# Patient Record
Sex: Male | Born: 1975 | Race: White | Hispanic: No | Marital: Single | Smoking: Current every day smoker
Health system: Southern US, Community
[De-identification: ages and names within clinical notes are randomized; demographics above are authoritative.]

## PROBLEM LIST (undated history)

## (undated) DIAGNOSIS — C801 Malignant (primary) neoplasm, unspecified: Secondary | ICD-10-CM

## (undated) DIAGNOSIS — K2210 Ulcer of esophagus without bleeding: Secondary | ICD-10-CM

## (undated) DIAGNOSIS — N289 Disorder of kidney and ureter, unspecified: Secondary | ICD-10-CM

---

## 1999-06-17 ENCOUNTER — Emergency Department (HOSPITAL_COMMUNITY): Admission: EM | Admit: 1999-06-17 | Discharge: 1999-06-17 | Payer: Self-pay | Admitting: Emergency Medicine

## 1999-06-24 ENCOUNTER — Emergency Department (HOSPITAL_COMMUNITY): Admission: EM | Admit: 1999-06-24 | Discharge: 1999-06-24 | Payer: Self-pay | Admitting: Emergency Medicine

## 2002-09-24 ENCOUNTER — Emergency Department (HOSPITAL_COMMUNITY): Admission: EM | Admit: 2002-09-24 | Discharge: 2002-09-24 | Payer: Self-pay | Admitting: Emergency Medicine

## 2002-09-24 ENCOUNTER — Encounter: Payer: Self-pay | Admitting: *Deleted

## 2005-08-31 DIAGNOSIS — K2210 Ulcer of esophagus without bleeding: Secondary | ICD-10-CM

## 2005-08-31 HISTORY — DX: Ulcer of esophagus without bleeding: K22.10

## 2007-12-31 ENCOUNTER — Emergency Department (HOSPITAL_COMMUNITY): Admission: EM | Admit: 2007-12-31 | Discharge: 2007-12-31 | Payer: Self-pay | Admitting: Emergency Medicine

## 2008-06-16 ENCOUNTER — Inpatient Hospital Stay (HOSPITAL_COMMUNITY): Admission: EM | Admit: 2008-06-16 | Discharge: 2008-06-19 | Payer: Self-pay | Admitting: Emergency Medicine

## 2008-06-17 ENCOUNTER — Ambulatory Visit: Payer: Self-pay | Admitting: Internal Medicine

## 2011-01-13 NOTE — H&P (Signed)
NAME:  Richard Bowman, Richard Bowman NO.:  [id number]   MEDICAL RECORD NO.:  [id number]          PATIENT TYPE:  EMS   LOCATION:  MAJO                         FACILITY:  [facility]   PHYSICIAN:  Lucita Ferrara, MD         DATE OF BIRTH:  Jan 02, 1976   DATE OF ADMISSION:  06/16/2008  DATE OF DISCHARGE:                              HISTORY & PHYSICAL   PRIORITY ADMISSION HISTORY AND PHYSICAL   PRIMARY CARE PHYSICIAN:  Unassigned.   The patient is a 35 year old male with no significant past medical  history, who presents to the [facility] with the chief  complaint of nausea and vomiting.  The vomitus was described as bloody  with clots in it.  The patient denies dark stools or use of nonsteroidal  antiinflammatory medications.  He denies any abdominal pain.  He denies  ever having a gastrointestinal bleed.  He denies alcohol use.  He also  denies taking Goody Powders.  He has never had an endoscopy and never  sees a gastroenterologist.  He does have a burning esophageal pain.  He  denies recent weight loss.  He denies a family history of colon cancer.  He denies hemorrhoids.  He states that his vomitus is described as  coffee-ground.  He denies any chest pain or shortness of breath.  Otherwise, a 12-point review of systems is negative.  Supposedly, the  patient has been using Tums for the last 6 months for this progressively  worsening midepigastric burning with no relief.   PAST MEDICAL HISTORY:  None.   FAMILY HISTORY:  Noncontributory.   PAST SURGICAL HISTORY:  None.   No history of colon cancer or stomach cancer.   SOCIAL HISTORY:  The patient denies drugs.  He is a current smoker and  smokes one-half pack per day.  The patient is an occasional drinker but  denies heavy alcohol use.  He uses alcohol 1 time per week, and he  denies binging.  No recent travel, with no sick contacts.   ALLERGIES:  No known drug allergies.   MEDICATIONS:  None but the  over-the-counter Tums.   REVIEW OF SYSTEMS:  As per H and P; otherwise, negative.   PHYSICAL EXAMINATION:  GENERAL:  The patient is in no acute distress.  VITAL SIGNS:  Blood pressure is 135/60, pulse is 85, respirations are  16, pulse-ox 98% on room air.  HEENT:  Normocephalic, atraumatic.  Sclerae is anicteric, PERRLA,  extraocular muscles are intact.  NECK:  Supple, no JVD, no carotid bruits.  CARDIOVASCULAR:  S1 S2, regular rate and rhythm, no murmurs, rubs, or  clicks.  LUNGS:  Clear to auscultation bilaterally, no rhonchi, rales, or  wheezes.  ABDOMEN:  Soft, nontender, and nondistended, positive bowel sounds.  RECTAL:  Stool color normal, guaiac negative.  NEURO:  The patient is alert and oriented x3.  Cranial nerves II-XII are  grossly intact.   LABORATORY DATA:  Patient had cardiac markers that were essentially  negative, a complete metabolic panel within normal limits, AST, ALT, and  alk-phos within normal limits.  INR  is 0.9.  Two sets of cardiac markers  negative.  Stool for occult blood negative.  CBC shows a white count of  13.7, hemoglobin is 18.1, hematocrit of 53.3, platelet count of 196.   RADIOLOGICAL RESULTS:  A KUB shows no active lung disease, a nonspecific  bowel-gas pattern.   ASSESSMENT AND PLAN:  A 35 year old with:  1. Nausea, vomiting, and upper gastrointestinal bleed really described      as coffee-ground emesis.  2. History of 6 months of midepigastric burning that is not relieved      with Tums.   PLAN:  We will go ahead and admit the patient to the medical telemetry  unit.  We will monitor his hemoglobin and hematocrit.  Will initiate  Protonix 40 mg IV twice daily.  We will proceed with a Gastrointestinal  consult for a possible upper endoscopy.  Transfuse as needed.  The  patient is hemodynamically stable.  We will keep the patient NPO, IV  hydration, and IV antiemetics.  The rest of the plans are dependent on  his progress and consultant  recommendations.  SCD boots for DVT  prophylaxis.      Lucita Ferrara, MD  Electronically Signed     RR/MEDQ  D:  06/16/2008  T:  06/16/2008  Job:  [id number]

## 2011-01-13 NOTE — Discharge Summary (Signed)
NAMECONWAY, Richard Bowman              ACCOUNT NO.:  [id number]   MEDICAL RECORD NO.:  [id number]          PATIENT TYPE:  INP   LOCATION:  5528                         FACILITY:  [facility]   PHYSICIAN:  Peggye Pitt, M.D. DATE OF BIRTH:  10/10/1975   DATE OF ADMISSION:  06/16/2008  DATE OF DISCHARGE:  06/19/2008                               DISCHARGE SUMMARY   DISCHARGE DIAGNOSES:  1. Acute upper gastrointestinal bleed secondary to esophageal ulcer      and gastritis.  2. Febrile illness, likely secondary to influenza.   DISCHARGE MEDICATIONS:  1. Omeprazole 20 mg p.o. b.i.d.  2. Sucralfate 1 gram 30 minutes q.a.c. plus nightly.  3. Tamiflu 75 mg p.o. b.i.d. for 2 more days.  4. Avelox 400 mg p.o. daily for 8 more days.   DISPOSITION AND FOLLOWUP:  The patient is discharged home in stable  condition.  He is advised not to use any aspirin, ibuprofen, Aleve, or  any other NSAIDs for pain.  He has also been instructed to avoid foods  that are high in acid.  Dr. Loreta Ave would like to see him in his office  again in 2-3 weeks to repeat an EGD, and I have asked the case manager  to provide [name] with her office number so that he can call her  and arrange for this appointment.   CONSULTATIONS THIS HOSPITALIZATION:  Dr. Loreta Ave with Gastroenterology.   Images on this hospitalization include:  1. An acute abdominal series on June 16, 2008, that showed no acute      lung disease with nonspecific bowel gas pattern, no obstruction or      free air.  2. The patient also had a chest x-ray on June 17, 2008, that showed      low lung volumes with mild pulmonary vascular congestion without      frank edema or focal air space disease.  3. The patient also had an EGD performed by Dr. Loreta Ave that showed a      small distal esophageal ulcer with distal esophagitis and patchy      gastritis.   HISTORY AND PHYSICAL EXAMINATION:  For full details, please refer to  history and physical exam  dictated by Dr. Flonnie Overman on date June 16, 2008, but in brief, Mr. Richard Bowman is a pleasant 35 year old Caucasian man  without significant past medical history who presented to the emergency  department with complaints of nausea and vomiting.  The vomiting was  described as bloody with clots in it.  He denied any dark stools or use  of NSAIDs.  For this reason, we were called for admission.   HOSPITAL COURSE BY PROBLEM:  1. Acute GI bleed:  Initially placed n.p.o. with cycling CBCs.  His      hemoglobin always remained stable above 14.  He did not receive any      transfusions on this hospitalization.  Dr. Loreta Ave with GI was      consulted who performed an EGD with the above findings and has      recommended that he be on b.i.d. PPI, as well  as Carafate.  She      will follow up with him in 2-3 weeks for repeat EGD to see if ulcer      is healing.  2. Febrile illness while in the hospital:  He had very high fevers up      to 103.  He was started empirically on Avelox for pneumonia and on      Tamiflu for influenza.  At the time of discharge, we have no      positive culture data, but I believe that this is likely secondary      to influenza.  He will continue the Tamiflu and the Avelox upon      discharge.  He has defervesced by the time of this discharge.   VITAL SIGNS ON DAY OF DISCHARGE:  Blood pressure 99/59, heart rate 96,  respirations 18, and O2 sats 96% on room air with a temperature 98.2.   LABORATORY ON DAY OF DISCHARGE:  Sodium 140, potassium 4.0, chloride  104, bicarb 28, BUN 9, creatinine 1.40, and glucose of 91.  WBCs 5.1,  hemoglobin 14.1, platelets of 144, and a mag level of 2.3.      Peggye Pitt, M.D.  Electronically Signed     EH/MEDQ  D:  06/19/2008  T:  06/20/2008  Job:  [id number]   cc:   Anselmo Rod, M.D.

## 2011-01-13 NOTE — Op Note (Signed)
Richard Bowman, Richard Bowman              ACCOUNT NO.:  [id number]   MEDICAL RECORD NO.:  [id number]          PATIENT TYPE:  INP   LOCATION:  5528                         FACILITY:  [facility]   PHYSICIAN:  Anselmo Rod, M.D.  DATE OF BIRTH:  02/17/1976   DATE OF PROCEDURE:  06/18/2008  DATE OF DISCHARGE:  06/19/2008                               OPERATIVE REPORT   PROCEDURE:  Esophagogastroduodenoscopy.   ENDOSCOPIST:  Anselmo Rod.   INSTRUMENT USED:  Pentax video panendoscope.   INDICATIONS FOR PROCEDURE:  This is a 35 year old white male with a  history of nausea, vomiting and bloody emesis, rule out peptic ulcer  disease, esophagitis, gastritis, etc.   PREPROCEDURE PREPARATION:  Informed consent was procured from the  patient.  The patient fasted for 8 hours prior to the procedure.  Risks  and benefits of the procedure were discussed with the patient in detail.   PHYSICAL EXAMINATION:  VITAL SIGNS:  The patient had stable vital signs.  NECK:  Supple.  CHEST:  Clear to auscultation.  HEART:  ABDOMEN:  Soft with normal bowel sounds.   DESCRIPTION OF PROCEDURE:  The patient was placed in the left lateral  decubitus position and sedated with 75 mcg of Fentanyl and 7.5 mg of  Versed given intravenously in slow incremental doses.  The patient was  also given 15 mL of viscous lidocaine to swish and swallow prior to the  procedure. Once the patient was adequately sedated and maintained on low-  flow oxygen and continuous cardiac monitoring, the Pentax video  colonoscope was advanced from the  mouth through the mouthpiece into the  esophagus under direct vision.  Small distal esophageal ulcer was noted  with distal esophagitis.  Patchy gastritis was appreciated.  No ulcers,  erosions, masses, or polyps identified in the stomach.  The proximal  small bowel appeared normal.  Retroflexion of the high cardia revealed  no abnormalities.  The patient tolerated the procedure well without  complications.   IMPRESSION:  1. Small distal esophageal ulcer close to the Z-line with distal      esophagitis (grade 2 to 3).  2. Patchy gastritis.  3. Normal proximal small bowel.   RECOMMENDATIONS:  1. Continue PPI.  2. Carafate slurry.  3. Avoid all nonsteroidals for now.  4. Further recommendations made in follow-up.      Anselmo Rod, M.D.  Electronically Signed     JNM/MEDQ  D:  07/09/2008  T:  07/10/2008  Job:  [id number]

## 2011-06-01 LAB — CBC
HCT: 53.3 — ABNORMAL HIGH
MCHC: 33.8
Platelets: 144 — ABNORMAL LOW
Platelets: 196
RDW: 13.1
RDW: 13.1

## 2011-06-01 LAB — HEMOGLOBIN AND HEMATOCRIT, BLOOD
HCT: 44.9
Hemoglobin: 14.2
Hemoglobin: 15.3

## 2011-06-01 LAB — COMPREHENSIVE METABOLIC PANEL
AST: 22
AST: 32
Albumin: 3.4 — ABNORMAL LOW
Albumin: 4.5
Alkaline Phosphatase: 84
BUN: 15
CO2: 24
Calcium: 8.4
Creatinine, Ser: 1.57 — ABNORMAL HIGH
GFR calc Af Amer: >60
GFR calc Af Amer: >60
GFR calc non Af Amer: 52 — ABNORMAL LOW
Potassium: 3.8
Total Protein: 8

## 2011-06-01 LAB — EXPECTORATED SPUTUM ASSESSMENT W GRAM STAIN, RFLX TO RESP C

## 2011-06-01 LAB — DIFFERENTIAL
Lymphocytes Relative: 7 — ABNORMAL LOW
Monocytes Absolute: 0.7
Monocytes Relative: 5
Neutro Abs: 11.9 — ABNORMAL HIGH

## 2011-06-01 LAB — URINE CULTURE
Colony Count: NO GROWTH
Special Requests: NEGATIVE

## 2011-06-01 LAB — BASIC METABOLIC PANEL
BUN: 8
BUN: 9
CO2: 25
Calcium: 8.8
Calcium: 9
Creatinine, Ser: 1.39
Creatinine, Ser: 1.4
GFR calc Af Amer: >60
GFR calc non Af Amer: 60 — ABNORMAL LOW
Glucose, Bld: 91
Glucose, Bld: 99
Sodium: 140

## 2011-06-01 LAB — PHOSPHORUS: Phosphorus: 2.1 — ABNORMAL LOW

## 2011-06-01 LAB — CULTURE, BLOOD (ROUTINE X 2): Culture: NO GROWTH

## 2011-06-01 LAB — ABO/RH: ABO/RH(D): A POS

## 2011-06-01 LAB — TYPE AND SCREEN: ABO/RH(D): A POS

## 2011-06-01 LAB — OCCULT BLOOD X 1 CARD TO LAB, STOOL: Fecal Occult Bld: NEGATIVE

## 2011-06-01 LAB — MAGNESIUM: Magnesium: 1.5

## 2014-05-22 ENCOUNTER — Encounter (HOSPITAL_COMMUNITY): Payer: Self-pay | Admitting: Emergency Medicine

## 2014-05-22 DIAGNOSIS — R404 Transient alteration of awareness: Secondary | ICD-10-CM | POA: Insufficient documentation

## 2014-05-22 LAB — URINALYSIS, ROUTINE W REFLEX MICROSCOPIC
Bilirubin Urine: NEGATIVE
GLUCOSE, UA: NEGATIVE mg/dL
Hgb urine dipstick: NEGATIVE
KETONES UR: NEGATIVE mg/dL
LEUKOCYTES UA: NEGATIVE
Nitrite: NEGATIVE
PH: 5 (ref 5.0–8.0)
PROTEIN: NEGATIVE mg/dL
Specific Gravity, Urine: 1.02 (ref 1.005–1.030)
Urobilinogen, UA: 0.2 mg/dL (ref 0.0–1.0)

## 2014-05-22 LAB — BASIC METABOLIC PANEL
Anion gap: 11 (ref 5–15)
BUN: 11 mg/dL (ref 6–23)
CALCIUM: 9.7 mg/dL (ref 8.4–10.5)
CO2: 29 meq/L (ref 19–32)
Chloride: 104 mEq/L (ref 96–112)
Creatinine, Ser: 1.35 mg/dL (ref 0.50–1.35)
GFR calc Af Amer: 77 mL/min — ABNORMAL LOW (ref >90)
GFR, EST NON AFRICAN AMERICAN: 66 mL/min — AB (ref >90)
Glucose, Bld: 94 mg/dL (ref 70–99)
Potassium: 4 mEq/L (ref 3.7–5.3)
SODIUM: 144 meq/L (ref 137–147)

## 2014-05-22 LAB — CBC WITH DIFFERENTIAL/PLATELET
Basophils Absolute: 0 10*3/uL (ref 0.0–0.1)
Basophils Relative: 0 % (ref 0–1)
EOS PCT: 3 % (ref 0–5)
Eosinophils Absolute: 0.3 10*3/uL (ref 0.0–0.7)
HCT: 45.5 % (ref 39.0–52.0)
HEMOGLOBIN: 15.9 g/dL (ref 13.0–17.0)
LYMPHS PCT: 34 % (ref 12–46)
Lymphs Abs: 3.1 10*3/uL (ref 0.7–4.0)
MCH: 30.5 pg (ref 26.0–34.0)
MCHC: 34.9 g/dL (ref 30.0–36.0)
MCV: 87.2 fL (ref 78.0–100.0)
MONOS PCT: 7 % (ref 3–12)
Monocytes Absolute: 0.6 10*3/uL (ref 0.1–1.0)
NEUTROS ABS: 5.1 10*3/uL (ref 1.7–7.7)
Neutrophils Relative %: 56 % (ref 43–77)
Platelets: 223 10*3/uL (ref 150–400)
RBC: 5.22 MIL/uL (ref 4.22–5.81)
RDW: 12.5 % (ref 11.5–15.5)
WBC: 9 10*3/uL (ref 4.0–10.5)

## 2014-05-22 LAB — CBG MONITORING, ED: GLUCOSE-CAPILLARY: 121 mg/dL — AB (ref 70–99)

## 2014-05-22 NOTE — ED Notes (Addendum)
Pt lost consciousness x 2 on Sunday; EMS arrived and CBG was 33. Unresponsive then came around enough for him to eat and CBG came back up. Pt refused transport. Friend says that she can tell when he is getting low because his speech slurs. Pt reports always being thirsty but not frequent urination. Pt states he feels tired all the time.

## 2014-05-23 ENCOUNTER — Emergency Department (HOSPITAL_COMMUNITY)
Admission: EM | Admit: 2014-05-23 | Discharge: 2014-05-23 | Discharge status: Against Medical Advice | Payer: Self-pay | Attending: Emergency Medicine | Admitting: Emergency Medicine

## 2014-05-23 HISTORY — DX: Ulcer of esophagus without bleeding: K22.10

## 2014-05-23 NOTE — ED Notes (Signed)
No response when pt called for reassessment.

## 2014-05-30 ENCOUNTER — Emergency Department (HOSPITAL_COMMUNITY): Payer: Self-pay

## 2014-05-30 ENCOUNTER — Encounter (HOSPITAL_COMMUNITY): Payer: Self-pay | Admitting: Emergency Medicine

## 2014-05-30 ENCOUNTER — Emergency Department (HOSPITAL_COMMUNITY)
Admission: EM | Admit: 2014-05-30 | Discharge: 2014-05-30 | Disposition: A | Discharge status: Home / Self Care | Payer: Self-pay | Attending: Emergency Medicine | Admitting: Emergency Medicine

## 2014-05-30 DIAGNOSIS — F29 Unspecified psychosis not due to a substance or known physiological condition: Secondary | ICD-10-CM | POA: Insufficient documentation

## 2014-05-30 DIAGNOSIS — F17200 Nicotine dependence, unspecified, uncomplicated: Secondary | ICD-10-CM | POA: Insufficient documentation

## 2014-05-30 DIAGNOSIS — R569 Unspecified convulsions: Secondary | ICD-10-CM | POA: Insufficient documentation

## 2014-05-30 DIAGNOSIS — N189 Chronic kidney disease, unspecified: Secondary | ICD-10-CM | POA: Insufficient documentation

## 2014-05-30 DIAGNOSIS — Z8719 Personal history of other diseases of the digestive system: Secondary | ICD-10-CM | POA: Insufficient documentation

## 2014-05-30 HISTORY — DX: Disorder of kidney and ureter, unspecified: N28.9

## 2014-05-30 LAB — COMPREHENSIVE METABOLIC PANEL
ALT: 22 U/L (ref 0–53)
ANION GAP: 13 (ref 5–15)
AST: 18 U/L (ref 0–37)
Albumin: 3.8 g/dL (ref 3.5–5.2)
Alkaline Phosphatase: 85 U/L (ref 39–117)
BUN: 10 mg/dL (ref 6–23)
CO2: 25 meq/L (ref 19–32)
Calcium: 9.5 mg/dL (ref 8.4–10.5)
Chloride: 104 mEq/L (ref 96–112)
Creatinine, Ser: 1.18 mg/dL (ref 0.50–1.35)
GFR calc non Af Amer: 78 mL/min — ABNORMAL LOW (ref >90)
GLUCOSE: 108 mg/dL — AB (ref 70–99)
Potassium: 3.9 mEq/L (ref 3.7–5.3)
Sodium: 142 mEq/L (ref 137–147)
Total Bilirubin: 0.2 mg/dL — ABNORMAL LOW (ref 0.3–1.2)
Total Protein: 7 g/dL (ref 6.0–8.3)

## 2014-05-30 LAB — CBC
HEMATOCRIT: 44.4 % (ref 39.0–52.0)
Hemoglobin: 15.6 g/dL (ref 13.0–17.0)
MCH: 30.8 pg (ref 26.0–34.0)
MCHC: 35.1 g/dL (ref 30.0–36.0)
MCV: 87.6 fL (ref 78.0–100.0)
Platelets: 182 10*3/uL (ref 150–400)
RBC: 5.07 MIL/uL (ref 4.22–5.81)
RDW: 12.3 % (ref 11.5–15.5)
WBC: 8.6 10*3/uL (ref 4.0–10.5)

## 2014-05-30 LAB — CBG MONITORING, ED: Glucose-Capillary: 166 mg/dL — ABNORMAL HIGH (ref 70–99)

## 2014-05-30 NOTE — Discharge Instructions (Signed)
Epilepsy °Epilepsy is a disorder in which a person has repeated seizures over time. A seizure is a release of abnormal electrical activity in the brain. Seizures can cause a change in attention, behavior, or the ability to remain awake and alert (altered mental status). Seizures often involve uncontrollable shaking (convulsions).  °Most people with epilepsy lead normal lives. However, people with epilepsy are at an increased risk of falls, accidents, and injuries. Therefore, it is important to begin treatment right away. °CAUSES  °Epilepsy has many possible causes. Anything that disturbs the normal pattern of brain cell activity can lead to seizures. This may include:  °· Head injury. °· Birth trauma. °· High fever as a child. °· Stroke. °· Bleeding into or around the brain. °· Certain drugs. °· Prolonged low oxygen, such as what occurs after CPR efforts. °· Abnormal brain development. °· Certain illnesses, such as meningitis, encephalitis (brain infection), malaria, and other infections. °· An imbalance of nerve signaling chemicals (neurotransmitters).   °SIGNS AND SYMPTOMS  °The symptoms of a seizure can vary greatly from one person to another. Right before a seizure, you may have a warning (aura) that a seizure is about to occur. An aura may include the following symptoms: °· Fear or anxiety. °· Nausea. °· Feeling like the room is spinning (vertigo). °· Vision changes, such as seeing flashing lights or spots. °Common symptoms during a seizure include: °· Abnormal sensations, such as an abnormal smell or a bitter taste in the mouth.   °· Sudden, general body stiffness.   °· Convulsions that involve rhythmic jerking of the face, arm, or leg on one or both sides.   °· Sudden change in consciousness.   °· Appearing to be awake but not responding.   °· Appearing to be asleep but cannot be awakened.   °· Grimacing, chewing, lip smacking, drooling, tongue biting, or loss of bowel or bladder control. °After a seizure,  you may feel sleepy for a while.  °DIAGNOSIS  °Your health care provider will ask about your symptoms and take a medical history. Descriptions from any witnesses to your seizures will be very helpful in the diagnosis. A physical exam, including a detailed neurological exam, is necessary. Various tests may be done, such as:  °· An electroencephalogram (EEG). This is a painless test of your brain waves. In this test, a diagram is created of your brain waves. These diagrams can be interpreted by a specialist. °· An MRI of the brain.   °· A CT scan of the brain.   °· A spinal tap (lumbar puncture, LP). °· Blood tests to check for signs of infection or abnormal blood chemistry. °TREATMENT  °There is no cure for epilepsy, but it is generally treatable. Once epilepsy is diagnosed, it is important to begin treatment as soon as possible. For most people with epilepsy, seizures can be controlled with medicines. The following may also be used: °· A pacemaker for the brain (vagus nerve stimulator) can be used for people with seizures that are not well controlled by medicine. °· Surgery on the brain. °For some people, epilepsy eventually goes away. °HOME CARE INSTRUCTIONS  °· Follow your health care provider's recommendations on driving and safety in normal activities. °· Get enough rest. Lack of sleep can cause seizures. °· Only take over-the-counter or prescription medicines as directed by your health care provider. Take any prescribed medicine exactly as directed. °· Avoid any known triggers of your seizures. °· Keep a seizure diary. Record what you recall about any seizure, especially any possible trigger.   °· Make   sure the people you live and work with know that you are prone to seizures. They should receive instructions on how to help you. In general, a witness to a seizure should:   °¨ Cushion your head and body.   °¨ Turn you on your side.   °¨ Avoid unnecessarily restraining you.   °¨ Not place anything inside your  mouth.   °¨ Call for emergency medical help if there is any question about what has occurred.   °· Follow up with your health care provider as directed. You may need regular blood tests to monitor the levels of your medicine.   °SEEK MEDICAL CARE IF:  °· You develop signs of infection or other illness. This might increase the risk of a seizure.   °· You seem to be having more frequent seizures.   °· Your seizure pattern is changing.   °SEEK IMMEDIATE MEDICAL CARE IF:  °· You have a seizure that does not stop after a few moments.   °· You have a seizure that causes any difficulty in breathing.   °· You have a seizure that results in a very severe headache.   °· You have a seizure that leaves you with the inability to speak or use a part of your body.   °Document Released: 08/17/2005 Document Revised: 06/07/2013 Document Reviewed: 03/29/2013 °ExitCare® Patient Information ©2015 [employer]. This information is not intended to replace advice given to you by your health care provider. Make sure you discuss any questions you have with your health care provider. ° °Driving and Equipment Restrictions °Some medical problems make it dangerous to drive, ride a bike, or use machines. Some of these problems are: °· A hard blow to the head (concussion). °· Passing out (fainting). °· Twitching and shaking (seizures). °· Low blood sugar. °· Taking medicine to help you relax (sedatives). °· Taking pain medicines. °· Wearing an eye patch. °· Wearing splints. This can make it hard to use parts of your body that you need to drive safely. °HOME CARE  °· Do not drive until your doctor says it is okay. °· Do not use machines until your doctor says it is okay. °You may need a form signed by your doctor (medical release) before you can drive again. You may also need this form before you do other tasks where you need to be fully alert. °MAKE SURE YOU: °· Understand these instructions. °· Will watch your condition. °· Will get help right  away if you are not doing well or get worse. °Document Released: 09/24/2004 Document Revised: 11/09/2011 Document Reviewed: 12/25/2009 °ExitCare® Patient Information ©2015 [employer]. This information is not intended to replace advice given to you by your health care provider. Make sure you discuss any questions you have with your health care provider. ° °

## 2014-05-30 NOTE — ED Notes (Signed)
Pt's friend states that they were leaving [facility].  States that he stated that he did not feel well and then stuck his head out the window to vomit.  Then began shaking and gasping for air and "went unconscious".  When he woke up, pt was combative.  In triage chair, pt went unresponsive.  Responded to ammonia inhalant.  Pt not a known diabetic but is suspected.  Appt Wednesday to find out.  Has CKD.  Sunday had hypoglycemic event w/ CBG of 33.

## 2014-05-30 NOTE — ED Notes (Signed)
Pt reports they are leaving with ALL belongings they arrived with. Pt is ambulatory upon discharge.

## 2014-05-30 NOTE — ED Notes (Signed)
MD Allen at bedside.

## 2014-05-30 NOTE — ED Provider Notes (Signed)
CSN: [id number]     Arrival date & time 05/30/14  1450 History   First MD Initiated Contact with Patient 05/30/14 1520     Chief Complaint  Patient presents with  . Seizures     (Consider location/radiation/quality/duration/timing/severity/associated sxs/prior Treatment) HPI Comments: Patient here complaining of seizure-like activity just prior to arrival. His wife describes a 30 second episode of generalized tonic-clonic activity with confusion that was transient after the event. No loss of bowel or bladder function. No tongue biting noted. No recent history of headaches head trauma. Denies any history of seizure disorder. Recently diagnosed with chronic kidney disease as well as possible diabetes. Did have a hypoglycemic event 4 days ago with cbg of 33. CBg today is above 100. Patient states he still feels sleepy. A recent fever or neck pain.  Patient is a 38 y.o. male presenting with seizures. The history is provided by the patient and the spouse.  Seizures   Past Medical History  Diagnosis Date  . Esophageal ulcer   . Renal disorder     CKD   History reviewed. No pertinent past surgical history. History reviewed. No pertinent family history. History  Substance Use Topics  . Smoking status: Current Every Day Smoker  . Smokeless tobacco: Not on file  . Alcohol Use: No    Review of Systems  Neurological: Positive for seizures.  All other systems reviewed and are negative.     Allergies  Review of patient's allergies indicates no known allergies.  Home Medications   Prior to Admission medications   Medication Sig Start Date End Date Taking? Authorizing Provider  ibuprofen (ADVIL,MOTRIN) 200 MG tablet Take 800 mg by mouth every 6 (six) hours as needed for moderate pain.   Yes Historical Provider, MD   BP 143/100  Pulse 97  Temp(Src) 98.6 F (37 C) (Oral)  Resp 17  SpO2 97% Physical Exam  Nursing note and vitals reviewed. Constitutional: He is oriented to person,  place, and time. He appears well-developed and well-nourished.  Non-toxic appearance. No distress.  HENT:  Head: Normocephalic and atraumatic.  Eyes: Conjunctivae, EOM and lids are normal. Pupils are equal, round, and reactive to light.  Neck: Normal range of motion. Neck supple. No tracheal deviation present. No mass present.  Cardiovascular: Normal rate, regular rhythm and normal heart sounds.  Exam reveals no gallop.   No murmur heard. Pulmonary/Chest: Effort normal and breath sounds normal. No stridor. No respiratory distress. He has no decreased breath sounds. He has no wheezes. He has no rhonchi. He has no rales.  Abdominal: Soft. Normal appearance and bowel sounds are normal. He exhibits no distension. There is no tenderness. There is no rebound and no CVA tenderness.  Musculoskeletal: Normal range of motion. He exhibits no edema and no tenderness.  Neurological: He is alert and oriented to person, place, and time. He has normal strength. No cranial nerve deficit or sensory deficit. GCS eye subscore is 4. GCS verbal subscore is 5. GCS motor subscore is 6.  Skin: Skin is warm and dry. No abrasion and no rash noted.  Psychiatric: He has a normal mood and affect. His speech is normal and behavior is normal.    ED Course  Procedures (including critical care time) Labs Review Labs Reviewed  CBG MONITORING, ED - Abnormal; Notable for the following:    Glucose-Capillary 166 (*)    All other components within normal limits  CBC  COMPREHENSIVE METABOLIC PANEL    Imaging Review No results found.  EKG Interpretation None      MDM   Final diagnoses:  None    Patient without seizure activity here. Renal function has improved. Will be given referral to neurology and instructed to not operate heavy machinery or drive a car    Toy BakerAnthony T Kathie Posa, MD 05/30/14 1728

## 2014-05-31 ENCOUNTER — Telehealth: Payer: Self-pay | Admitting: *Deleted

## 2014-05-31 NOTE — Telephone Encounter (Signed)
Patient calling to schedule an appointment with Dr. Anne Hahn for a follow up, offered first available for 06/15/14, patient stated that he will call to be sent somewhere else, and hung up the phone.

## 2014-06-04 ENCOUNTER — Ambulatory Visit: Payer: Self-pay | Admitting: Family

## 2014-06-04 DIAGNOSIS — Z0289 Encounter for other administrative examinations: Secondary | ICD-10-CM

## 2014-06-12 ENCOUNTER — Encounter: Payer: Self-pay | Admitting: Internal Medicine

## 2014-06-12 ENCOUNTER — Ambulatory Visit: Payer: Self-pay | Attending: Internal Medicine | Admitting: Internal Medicine

## 2014-06-12 VITALS — BP 104/65 | HR 74 | Temp 98.8°F | Resp 16 | Ht 70.0 in | Wt 207.2 lb

## 2014-06-12 DIAGNOSIS — R739 Hyperglycemia, unspecified: Secondary | ICD-10-CM | POA: Insufficient documentation

## 2014-06-12 DIAGNOSIS — K2210 Ulcer of esophagus without bleeding: Secondary | ICD-10-CM | POA: Insufficient documentation

## 2014-06-12 DIAGNOSIS — F17200 Nicotine dependence, unspecified, uncomplicated: Secondary | ICD-10-CM | POA: Insufficient documentation

## 2014-06-12 DIAGNOSIS — N189 Chronic kidney disease, unspecified: Secondary | ICD-10-CM | POA: Insufficient documentation

## 2014-06-12 DIAGNOSIS — Z2821 Immunization not carried out because of patient refusal: Secondary | ICD-10-CM | POA: Insufficient documentation

## 2014-06-12 DIAGNOSIS — Z720 Tobacco use: Secondary | ICD-10-CM | POA: Insufficient documentation

## 2014-06-12 DIAGNOSIS — R569 Unspecified convulsions: Secondary | ICD-10-CM | POA: Insufficient documentation

## 2014-06-12 DIAGNOSIS — Z716 Tobacco abuse counseling: Secondary | ICD-10-CM | POA: Insufficient documentation

## 2014-06-12 LAB — GLUCOSE, POCT (MANUAL RESULT ENTRY): POC Glucose: 132 mg/dl — AB (ref 70–99)

## 2014-06-12 LAB — POCT GLYCOSYLATED HEMOGLOBIN (HGB A1C): HEMOGLOBIN A1C: 5.6

## 2014-06-12 NOTE — Progress Notes (Signed)
Patient presents to establish care States 3-4 weeks ago had 2 episodes of vomiting and passing out EMS came and CBG was 33 States had seizure 2 weeks ago States was told in ED that he has kidney disease Injured back in 2011. Now with low back spasms; rates 3/10 at present

## 2014-06-12 NOTE — Patient Instructions (Signed)
Smoking Cessation Quitting smoking is important to your health and has many advantages. However, it is not always easy to quit since nicotine is a very addictive drug. Oftentimes, people try 3 times or more before being able to quit. This document explains the best ways for you to prepare to quit smoking. Quitting takes hard work and a lot of effort, but you can do it. ADVANTAGES OF QUITTING SMOKING  You will live longer, feel better, and live better.  Your body will feel the impact of quitting smoking almost immediately.  Within 20 minutes, blood pressure decreases. Your pulse returns to its normal level.  After 8 hours, carbon monoxide levels in the blood return to normal. Your oxygen level increases.  After 24 hours, the chance of having a heart attack starts to decrease. Your breath, hair, and body stop smelling like smoke.  After 48 hours, damaged nerve endings begin to recover. Your sense of taste and smell improve.  After 72 hours, the body is virtually free of nicotine. Your bronchial tubes relax and breathing becomes easier.  After 2 to 12 weeks, lungs can hold more air. Exercise becomes easier and circulation improves.  The risk of having a heart attack, stroke, cancer, or lung disease is greatly reduced.  After 1 year, the risk of coronary heart disease is cut in half.  After 5 years, the risk of stroke falls to the same as a nonsmoker.  After 10 years, the risk of lung cancer is cut in half and the risk of other cancers decreases significantly.  After 15 years, the risk of coronary heart disease drops, usually to the level of a nonsmoker.  If you are pregnant, quitting smoking will improve your chances of having a healthy baby.  The people you live with, especially any children, will be healthier.  You will have extra money to spend on things other than cigarettes. QUESTIONS TO THINK ABOUT BEFORE ATTEMPTING TO QUIT You may want to talk about your answers with your  health care provider.  Why do you want to quit?  If you tried to quit in the past, what helped and what did not?  What will be the most difficult situations for you after you quit? How will you plan to handle them?  Who can help you through the tough times? Your family? Friends? A health care provider?  What pleasures do you get from smoking? What ways can you still get pleasure if you quit? Here are some questions to ask your health care provider:  How can you help me to be successful at quitting?  What medicine do you think would be best for me and how should I take it?  What should I do if I need more help?  What is smoking withdrawal like? How can I get information on withdrawal? GET READY  Set a quit date.  Change your environment by getting rid of all cigarettes, ashtrays, matches, and lighters in your home, car, or work. Do not let people smoke in your home.  Review your past attempts to quit. Think about what worked and what did not. GET SUPPORT AND ENCOURAGEMENT You have a better chance of being successful if you have help. You can get support in many ways.  Tell your family, friends, and coworkers that you are going to quit and need their support. Ask them not to smoke around you.  Get individual, group, or telephone counseling and support. Programs are available at local hospitals and health centers. Call   your local health department for information about programs in your area.  Spiritual beliefs and practices may help some smokers quit.  Download a "quit meter" on your computer to keep track of quit statistics, such as how long you have gone without smoking, cigarettes not smoked, and money saved.  Get a self-help book about quitting smoking and staying off tobacco. LEARN NEW SKILLS AND BEHAVIORS  Distract yourself from urges to smoke. Talk to someone, go for a walk, or occupy your time with a task.  Change your normal routine. Take a different route to work.  Drink tea instead of coffee. Eat breakfast in a different place.  Reduce your stress. Take a hot bath, exercise, or read a book.  Plan something enjoyable to do every day. Reward yourself for not smoking.  Explore interactive web-based programs that specialize in helping you quit. GET MEDICINE AND USE IT CORRECTLY Medicines can help you stop smoking and decrease the urge to smoke. Combining medicine with the above behavioral methods and support can greatly increase your chances of successfully quitting smoking.  Nicotine replacement therapy helps deliver nicotine to your body without the negative effects and risks of smoking. Nicotine replacement therapy includes nicotine gum, lozenges, inhalers, nasal sprays, and skin patches. Some may be available over-the-counter and others require a prescription.  Antidepressant medicine helps people abstain from smoking, but how this works is unknown. This medicine is available by prescription.  Nicotinic receptor partial agonist medicine simulates the effect of nicotine in your brain. This medicine is available by prescription. Ask your health care provider for advice about which medicines to use and how to use them based on your health history. Your health care provider will tell you what side effects to look out for if you choose to be on a medicine or therapy. Carefully read the information on the package. Do not use any other product containing nicotine while using a nicotine replacement product.  RELAPSE OR DIFFICULT SITUATIONS Most relapses occur within the first 3 months after quitting. Do not be discouraged if you start smoking again. Remember, most people try several times before finally quitting. You may have symptoms of withdrawal because your body is used to nicotine. You may crave cigarettes, be irritable, feel very hungry, cough often, get headaches, or have difficulty concentrating. The withdrawal symptoms are only temporary. They are strongest  when you first quit, but they will go away within 10-14 days. To reduce the chances of relapse, try to:  Avoid drinking alcohol. Drinking lowers your chances of successfully quitting.  Reduce the amount of caffeine you consume. Once you quit smoking, the amount of caffeine in your body increases and can give you symptoms, such as a rapid heartbeat, sweating, and anxiety.  Avoid smokers because they can make you want to smoke.  Do not let weight gain distract you. Many smokers will gain weight when they quit, usually less than 10 pounds. Eat a healthy diet and stay active. You can always lose the weight gained after you quit.  Find ways to improve your mood other than smoking. FOR MORE INFORMATION  www.smokefree.gov  Document Released: 08/11/2001 Document Revised: 01/01/2014 Document Reviewed: 11/26/2011 Exit[employer] Patient Information 2015 [employer]. This information is not intended to replace advice given to you by your health care provider. Make sure you discuss any questions you have with your health care provider.  
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
## 2014-06-13 ENCOUNTER — Ambulatory Visit: Payer: Self-pay | Attending: Internal Medicine

## 2014-06-27 ENCOUNTER — Other Ambulatory Visit: Payer: Self-pay

## 2014-06-28 ENCOUNTER — Ambulatory Visit: Payer: Self-pay | Attending: Internal Medicine | Admitting: *Deleted

## 2014-06-28 VITALS — BP 112/67 | HR 80 | Temp 98.9°F

## 2014-06-28 DIAGNOSIS — R739 Hyperglycemia, unspecified: Secondary | ICD-10-CM

## 2014-06-28 DIAGNOSIS — E162 Hypoglycemia, unspecified: Secondary | ICD-10-CM | POA: Insufficient documentation

## 2014-06-28 DIAGNOSIS — R569 Unspecified convulsions: Secondary | ICD-10-CM

## 2014-06-28 DIAGNOSIS — Z299 Encounter for prophylactic measures, unspecified: Secondary | ICD-10-CM

## 2014-06-28 LAB — POCT CBG (FASTING - GLUCOSE)-MANUAL ENTRY: Glucose Fasting, POC: 98 mg/dL (ref 70–99)

## 2014-06-28 NOTE — Progress Notes (Unsigned)
Patient presents for fasting blood draw and CBG Still smoking 1 ppd Stopped eating fast food Has cut back on energy drinks: Now 1-2 per week down from 1-2 per day States feeling better Declined flu vaccine  Fasting CBG 98  BP 112/67 P 80 SPO2 97% 98.9 oral  States recently learned partner was unfaithful; requesting STD testing.

## 2014-06-28 NOTE — Patient Instructions (Signed)
Hypoglycemia °Hypoglycemia occurs when the glucose in your blood is too low. Glucose is a type of sugar that is your body's main energy source. Hormones, such as insulin and glucagon, control the level of glucose in the blood. Insulin lowers blood glucose and glucagon increases blood glucose. Having too much insulin in your blood stream, or not eating enough food containing sugar, can result in hypoglycemia. Hypoglycemia can happen to people with or without diabetes. It can develop quickly and can be a medical emergency.  °CAUSES  °· Missing or delaying meals. °· Not eating enough carbohydrates at meals. °· Taking too much diabetes medicine. °· Not timing your oral diabetes medicine or insulin doses with meals, snacks, and exercise. °· Nausea and vomiting. °· Certain medicines. °· Severe illnesses, such as hepatitis, kidney disorders, and certain eating disorders. °· Increased activity or exercise without eating something extra or adjusting medicines. °· Drinking too much alcohol. °· A nerve disorder that affects body functions like your heart rate, blood pressure, and digestion (autonomic neuropathy). °· A condition where the stomach muscles do not function properly (gastroparesis). Therefore, medicines and food may not absorb properly. °· Rarely, a tumor of the pancreas can produce too much insulin. °SYMPTOMS  °· Hunger. °· Sweating (diaphoresis). °· Change in body temperature. °· Shakiness. °· Headache. °· Anxiety. °· Lightheadedness. °· Irritability. °· Difficulty concentrating. °· Dry mouth. °· Tingling or numbness in the hands or feet. °· Restless sleep or sleep disturbances. °· Altered speech and coordination. °· Change in mental status. °· Seizures or prolonged convulsions. °· Combativeness. °· Drowsiness (lethargic). °· Weakness. °· Increased heart rate or palpitations. °· Confusion. °· Pale, gray skin color. °· Blurred or double vision. °· Fainting. °DIAGNOSIS  °A physical exam and medical history will be  performed. Your caregiver may make a diagnosis based on your symptoms. Blood tests and other lab tests may be performed to confirm a diagnosis. Once the diagnosis is made, your caregiver will see if your signs and symptoms go away once your blood glucose is raised.  °TREATMENT  °Usually, you can easily treat your hypoglycemia when you notice symptoms. °· Check your blood glucose. If it is less than 70 mg/dl, take one of the following:   °¨ 3-4 glucose tablets.   °¨ ½ cup juice.   °¨ ½ cup regular soda.   °¨ 1 cup skim milk.   °¨ ½-1 tube of glucose gel.   °¨ 5-6 hard candies.   °· Avoid high-fat drinks or food that may delay a rise in blood glucose levels. °· Do not take more than the recommended amount of sugary foods, drinks, gel, or tablets. Doing so will cause your blood glucose to go too high.   °· Wait 10-15 minutes and recheck your blood glucose. If it is still less than 70 mg/dl or below your target range, repeat treatment.   °· Eat a snack if it is more than 1 hour until your next meal.   °There may be a time when your blood glucose may go so low that you are unable to treat yourself at home when you start to notice symptoms. You may need someone to help you. You may even faint or be unable to swallow. If you cannot treat yourself, someone will need to bring you to the hospital.  °HOME CARE INSTRUCTIONS °· If you have diabetes, follow your diabetes management plan by: °¨ Taking your medicines as directed. °¨ Following your exercise plan. °¨ Following your meal plan. Do not skip meals. Eat on time. °¨ Testing your blood   glucose regularly. Check your blood glucose before and after exercise. If you exercise longer or different than usual, be sure to check blood glucose more frequently. °¨ Wearing your medical alert jewelry that says you have diabetes. °· Identify the cause of your hypoglycemia. Then, develop ways to prevent the recurrence of hypoglycemia. °· Do not take a hot bath or shower right after an  insulin shot. °· Always carry treatment with you. Glucose tablets are the easiest to carry. °· If you are going to drink alcohol, drink it only with meals. °· Tell friends or family members ways to keep you safe during a seizure. This may include removing hard or sharp objects from the area or turning you on your side. °· Maintain a healthy weight. °SEEK MEDICAL CARE IF:  °· You are having problems keeping your blood glucose in your target range. °· You are having frequent episodes of hypoglycemia. °· You feel you might be having side effects from your medicines. °· You are not sure why your blood glucose is dropping so low. °· You notice a change in vision or a new problem with your vision. °SEEK IMMEDIATE MEDICAL CARE IF:  °· Confusion develops. °· A change in mental status occurs. °· The inability to swallow develops. °· Fainting occurs. °Document Released: 08/17/2005 Document Revised: 08/22/2013 Document Reviewed: 12/14/2011 °Exit[employer]® Patient Information ©2015 [employer]. This information is not intended to replace advice given to you by your health care provider. Make sure you discuss any questions you have with your health care provider. ° °

## 2014-06-29 ENCOUNTER — Other Ambulatory Visit: Payer: Self-pay

## 2014-06-29 LAB — LIPID PANEL
Cholesterol: 233 mg/dL — ABNORMAL HIGH (ref 0–200)
HDL: 42 mg/dL (ref >39)
LDL Cholesterol: 164 mg/dL — ABNORMAL HIGH (ref 0–99)
Total CHOL/HDL Ratio: 5.5 Ratio
Triglycerides: 137 mg/dL (ref <150)
VLDL: 27 mg/dL (ref 0–40)

## 2014-06-29 LAB — GC/CHLAMYDIA PROBE AMP, URINE
CHLAMYDIA, SWAB/URINE, PCR: NEGATIVE
GC Probe Amp, Urine: NEGATIVE

## 2014-06-29 LAB — RPR

## 2014-06-29 LAB — TSH: TSH: 1.478 u[IU]/mL (ref 0.350–4.500)

## 2014-06-29 LAB — HIV ANTIBODY (ROUTINE TESTING W REFLEX): HIV 1&2 Ab, 4th Generation: NONREACTIVE

## 2014-07-02 ENCOUNTER — Telehealth: Payer: Self-pay | Admitting: Internal Medicine

## 2014-07-02 ENCOUNTER — Ambulatory Visit: Payer: Self-pay | Attending: Internal Medicine | Admitting: *Deleted

## 2014-07-02 DIAGNOSIS — F4323 Adjustment disorder with mixed anxiety and depressed mood: Secondary | ICD-10-CM

## 2014-07-02 NOTE — Telephone Encounter (Signed)
Pt. Came into facility to request blood work results. Please f/u with pt.

## 2014-07-03 ENCOUNTER — Telehealth: Payer: Self-pay | Admitting: Emergency Medicine

## 2014-07-03 ENCOUNTER — Telehealth: Payer: Self-pay | Admitting: Internal Medicine

## 2014-07-03 ENCOUNTER — Encounter: Payer: Self-pay | Admitting: *Deleted

## 2014-07-03 NOTE — Progress Notes (Signed)
Patient initially requested an appointment during a very stressful time. Patient's mother had some serious health concerns and patient was ending a very serious relationship.  Patient presented with an improved mood but flat affect. Patient went on to identfy his current stressor as his relationship ending. Patient identified several anxiety sym[ptoms and challenges managing his mood and behaviors.  Patient states that he is calm and has not acted out violently, but continues to maintain contact with girlfriend. It is unclear if this contact is emotionally healthy.  LCSW processed with patient about boundaries and pursuing individual goals and using reflection and processing positively to minimize depressive and anxiety symptoms.  Patient will follow up in 1 week to assess current needs and address anxiety and depressive symptoms.    Beverly Sessionsywan J Jaileen Janelle MSW, LCSW Duration 75 minutes

## 2014-07-03 NOTE — Telephone Encounter (Signed)
Pt is requesting his results.

## 2014-07-03 NOTE — Telephone Encounter (Signed)
Pt was given lab preview of lab results. Pt informed provider hasn't posted resulted labs but most are normal except for cholesterol. Pt requesting blood work for kidney function States he was told in the hospital blood work showed chronic kidney disease Please f/u

## 2014-07-03 NOTE — Telephone Encounter (Signed)
Kidney function is within normal limits, I would just avoid excess NSAIDs in order to prevent dysfunction. All labs are normal with the exception of elevated cholesterol. negative for all STDs. Please explained dietary and lifestyle modifications that will help patient to lower cholesterol

## 2014-07-03 NOTE — Telephone Encounter (Signed)
Left VM for pt to call when message received 

## 2014-07-09 ENCOUNTER — Other Ambulatory Visit: Payer: Self-pay

## 2014-07-20 ENCOUNTER — Ambulatory Visit: Payer: Self-pay | Admitting: Neurology

## 2014-07-23 ENCOUNTER — Telehealth: Payer: Self-pay | Admitting: Neurology

## 2014-07-23 NOTE — Telephone Encounter (Signed)
Pt no showed 07/20/14 NP appt w/ Dr. Karel Jarvis. Referring provider notified via [employer] referral.  Alcario Drought - please send no show letter / Oneita Kras.

## 2014-07-31 ENCOUNTER — Encounter: Payer: Self-pay | Admitting: *Deleted

## 2014-07-31 NOTE — Progress Notes (Signed)
No show letter sent for 11/20/215

## 2014-08-10 ENCOUNTER — Emergency Department (HOSPITAL_COMMUNITY)
Admission: EM | Admit: 2014-08-10 | Discharge: 2014-08-11 | Disposition: A | Discharge status: Home / Self Care | Payer: Self-pay | Attending: Emergency Medicine | Admitting: Emergency Medicine

## 2014-08-10 ENCOUNTER — Encounter (HOSPITAL_COMMUNITY): Payer: Self-pay | Admitting: Emergency Medicine

## 2014-08-10 DIAGNOSIS — Z720 Tobacco use: Secondary | ICD-10-CM | POA: Insufficient documentation

## 2014-08-10 DIAGNOSIS — R42 Dizziness and giddiness: Secondary | ICD-10-CM | POA: Insufficient documentation

## 2014-08-10 DIAGNOSIS — Z8719 Personal history of other diseases of the digestive system: Secondary | ICD-10-CM | POA: Insufficient documentation

## 2014-08-10 DIAGNOSIS — N189 Chronic kidney disease, unspecified: Secondary | ICD-10-CM | POA: Insufficient documentation

## 2014-08-10 LAB — URINALYSIS, ROUTINE W REFLEX MICROSCOPIC
Glucose, UA: NEGATIVE mg/dL
Hgb urine dipstick: NEGATIVE
Ketones, ur: 15 mg/dL — AB
LEUKOCYTES UA: NEGATIVE
Nitrite: NEGATIVE
PH: 6 (ref 5.0–8.0)
Protein, ur: NEGATIVE mg/dL
SPECIFIC GRAVITY, URINE: 1.035 — AB (ref 1.005–1.030)
Urobilinogen, UA: 1 mg/dL (ref 0.0–1.0)

## 2014-08-10 LAB — CBG MONITORING, ED
Glucose-Capillary: 87 mg/dL (ref 70–99)
Glucose-Capillary: 103 mg/dL — ABNORMAL HIGH (ref 70–99)

## 2014-08-10 NOTE — ED Notes (Signed)
Patient was seen in the ED on 05/30/14 for seizures and was told to follow up with neurology, but did not see them due to some paperwork involving coverage.

## 2014-08-10 NOTE — ED Notes (Addendum)
Patient has not been told or diagnosed with diabetes. No acute distress noted at this time.

## 2014-08-10 NOTE — ED Notes (Signed)
Pt reports that he was feeling like his blood sugar was low, hx of same with chronic kidney disease. States that he has been followed by PCP for other vague symptoms that have not been able to be diagnosed. Pt states "I have been told I had seizures and diabetes but my doctor didn't give me anything for these." Pt reports feeling cold and lethargic. Denies other symptoms at this time.

## 2014-08-11 ENCOUNTER — Encounter (HOSPITAL_COMMUNITY): Payer: Self-pay | Admitting: Emergency Medicine

## 2014-08-11 LAB — CBG MONITORING, ED: Glucose-Capillary: 100 mg/dL — ABNORMAL HIGH (ref 70–99)

## 2014-08-11 LAB — I-STAT CHEM 8, ED
BUN: 23 mg/dL (ref 6–23)
CHLORIDE: 103 meq/L (ref 96–112)
Calcium, Ion: 1.15 mmol/L (ref 1.12–1.23)
Creatinine, Ser: 1.2 mg/dL (ref 0.50–1.35)
Glucose, Bld: 95 mg/dL (ref 70–99)
HEMATOCRIT: 49 % (ref 39.0–52.0)
Hemoglobin: 16.7 g/dL (ref 13.0–17.0)
POTASSIUM: 3.9 meq/L (ref 3.7–5.3)
Sodium: 141 mEq/L (ref 137–147)
TCO2: 26 mmol/L (ref 0–100)

## 2014-08-11 NOTE — Discharge Instructions (Signed)

## 2014-08-11 NOTE — ED Notes (Signed)
Pt ready to leave

## 2014-08-11 NOTE — ED Provider Notes (Signed)
CSN: [id number]     Arrival date & time 08/10/14  2027 History   First MD Initiated Contact with Patient 08/10/14 2309     Chief Complaint  Patient presents with  . Dizziness     (Consider location/radiation/quality/duration/timing/severity/associated sxs/prior Treatment) Patient is a 38 y.o. male presenting with dizziness. The history is provided by the patient.  Dizziness Quality:  Lightheadedness Severity:  Mild Onset quality:  Gradual Duration:  1 day Timing:  Constant Progression:  Unchanged Chronicity:  Recurrent Context comment:  Thinks sugar is low Relieved by:  Nothing Worsened by:  Nothing tried Ineffective treatments:  None tried Associated symptoms: no blood in stool   Risk factors: no anemia     Past Medical History  Diagnosis Date  . Esophageal ulcer   . Renal disorder     CKD   History reviewed. No pertinent past surgical history. Family History  Problem Relation Age of Onset  . COPD Mother    History  Substance Use Topics  . Smoking status: Current Every Day Smoker  . Smokeless tobacco: Not on file     Comment: Smoking 1 ppd  . Alcohol Use: No    Review of Systems  Constitutional: Negative for fever.  Gastrointestinal: Negative for blood in stool.  Neurological: Positive for dizziness. Negative for facial asymmetry, speech difficulty and weakness.  All other systems reviewed and are negative.     Allergies  Review of patient's allergies indicates no known allergies.  Home Medications   Prior to Admission medications   Medication Sig Start Date End Date Taking? Authorizing Provider  ibuprofen (ADVIL,MOTRIN) 200 MG tablet Take 800 mg by mouth every 6 (six) hours as needed for moderate pain.   Yes Historical Provider, MD   BP 115/67 mmHg  Pulse 72  Temp(Src) 98.3 F (36.8 C) (Oral)  Resp 18  SpO2 100% Physical Exam  Constitutional: He is oriented to person, place, and time. He appears well-developed and well-nourished. No distress.   HENT:  Head: Normocephalic and atraumatic.  Mouth/Throat: Oropharynx is clear and moist.  Eyes: Conjunctivae are normal. Pupils are equal, round, and reactive to light.  Neck: Normal range of motion. Neck supple.  Cardiovascular: Normal rate, regular rhythm and intact distal pulses.   Pulmonary/Chest: Effort normal and breath sounds normal. He has no wheezes. He has no rales.  Abdominal: Soft. Bowel sounds are normal. There is no tenderness. There is no rebound and no guarding.  Musculoskeletal: Normal range of motion.  Neurological: He is alert and oriented to person, place, and time. He has normal reflexes. No cranial nerve deficit.  Ambulated without difficulty  Skin: Skin is warm and dry.  Psychiatric: He has a normal mood and affect.    ED Course  Procedures (including critical care time) Labs Review Labs Reviewed  URINALYSIS, ROUTINE W REFLEX MICROSCOPIC - Abnormal; Notable for the following:    Color, Urine AMBER (*)    APPearance HAZY (*)    Specific Gravity, Urine 1.035 (*)    Bilirubin Urine SMALL (*)    Ketones, ur 15 (*)    All other components within normal limits  CBG MONITORING, ED - Abnormal; Notable for the following:    Glucose-Capillary 103 (*)    All other components within normal limits  CBG MONITORING, ED - Abnormal; Notable for the following:    Glucose-Capillary 100 (*)    All other components within normal limits  CBG MONITORING, ED  I-STAT CHEM 8, ED    Imaging  Review No results found.   EKG Interpretation   Date/Time:  Saturday August 11 2014 00:59:00 EST Ventricular Rate:  80 PR Interval:  154 QRS Duration: 98 QT Interval:  363 QTC Calculation: 419 R Axis:   36 Text Interpretation:  Sinus rhythm Confirmed by [facility]  MD, Camar Guyton  ([id number]) on 08/11/2014 3:06:26 AM      MDM   Final diagnoses:  Dizziness    Maintaining sugar ambulated without difficulty.  Eating.  No neuro deficits follow up with your PMD for ongoing  care   Marleta Lapierre K Wave Calzada-Rasch, MD 08/11/14 [phone]

## 2014-09-24 ENCOUNTER — Emergency Department (HOSPITAL_COMMUNITY)
Admission: EM | Admit: 2014-09-24 | Discharge: 2014-09-25 | Disposition: A | Discharge status: Home / Self Care | Payer: Self-pay | Attending: Emergency Medicine | Admitting: Emergency Medicine

## 2014-09-24 ENCOUNTER — Encounter (HOSPITAL_COMMUNITY): Payer: Self-pay

## 2014-09-24 DIAGNOSIS — Y92019 Unspecified place in single-family (private) house as the place of occurrence of the external cause: Secondary | ICD-10-CM | POA: Insufficient documentation

## 2014-09-24 DIAGNOSIS — Y998 Other external cause status: Secondary | ICD-10-CM | POA: Insufficient documentation

## 2014-09-24 DIAGNOSIS — Z8719 Personal history of other diseases of the digestive system: Secondary | ICD-10-CM | POA: Insufficient documentation

## 2014-09-24 DIAGNOSIS — N189 Chronic kidney disease, unspecified: Secondary | ICD-10-CM | POA: Insufficient documentation

## 2014-09-24 DIAGNOSIS — Z23 Encounter for immunization: Secondary | ICD-10-CM | POA: Insufficient documentation

## 2014-09-24 DIAGNOSIS — Z720 Tobacco use: Secondary | ICD-10-CM | POA: Insufficient documentation

## 2014-09-24 DIAGNOSIS — S61412A Laceration without foreign body of left hand, initial encounter: Secondary | ICD-10-CM

## 2014-09-24 DIAGNOSIS — Y9389 Activity, other specified: Secondary | ICD-10-CM | POA: Insufficient documentation

## 2014-09-24 MED ORDER — TETANUS-DIPHTH-ACELL PERTUSSIS 5-2.5-18.5 LF-MCG/0.5 IM SUSP
0.5000 mL | Freq: Once | INTRAMUSCULAR | Status: AC
  Administered 2014-09-25: 0.5 mL via INTRAMUSCULAR
  Filled 2014-09-24: qty 0.5

## 2014-09-24 MED ORDER — LIDOCAINE HCL 2 % IJ SOLN
10.0000 mL | Freq: Once | INTRAMUSCULAR | Status: DC
  Filled 2014-09-24: qty 20
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
## 2014-09-25 MED ORDER — BACITRACIN ZINC 500 UNIT/GM EX OINT: 1.0000 "application " | TOPICAL_OINTMENT | Freq: Two times a day (BID) | CUTANEOUS | Status: DC

## 2014-09-25 MED ORDER — BACITRACIN ZINC 500 UNIT/GM EX OINT
1.0000 "application " | TOPICAL_OINTMENT | Freq: Two times a day (BID) | CUTANEOUS | Status: DC
  Filled 2014-09-25: qty 0.9

## 2014-09-25 NOTE — Discharge Instructions (Signed)

## 2015-12-20 ENCOUNTER — Emergency Department (HOSPITAL_COMMUNITY)
Admission: EM | Admit: 2015-12-20 | Discharge: 2015-12-20 | Disposition: A | Discharge status: Home / Self Care | Payer: Self-pay | Attending: Emergency Medicine | Admitting: Emergency Medicine

## 2015-12-20 ENCOUNTER — Encounter (HOSPITAL_COMMUNITY): Payer: Self-pay | Admitting: *Deleted

## 2015-12-20 DIAGNOSIS — Y9389 Activity, other specified: Secondary | ICD-10-CM | POA: Insufficient documentation

## 2015-12-20 DIAGNOSIS — F17200 Nicotine dependence, unspecified, uncomplicated: Secondary | ICD-10-CM | POA: Insufficient documentation

## 2015-12-20 DIAGNOSIS — N181 Chronic kidney disease, stage 1: Secondary | ICD-10-CM | POA: Insufficient documentation

## 2015-12-20 DIAGNOSIS — S39012A Strain of muscle, fascia and tendon of lower back, initial encounter: Secondary | ICD-10-CM | POA: Insufficient documentation

## 2015-12-20 DIAGNOSIS — Y990 Civilian activity done for income or pay: Secondary | ICD-10-CM | POA: Insufficient documentation

## 2015-12-20 DIAGNOSIS — Y9289 Other specified places as the place of occurrence of the external cause: Secondary | ICD-10-CM | POA: Insufficient documentation

## 2015-12-20 DIAGNOSIS — X501XXA Overexertion from prolonged static or awkward postures, initial encounter: Secondary | ICD-10-CM | POA: Insufficient documentation

## 2015-12-20 DIAGNOSIS — Z8719 Personal history of other diseases of the digestive system: Secondary | ICD-10-CM | POA: Insufficient documentation

## 2015-12-20 DIAGNOSIS — Z792 Long term (current) use of antibiotics: Secondary | ICD-10-CM | POA: Insufficient documentation

## 2015-12-20 MED ORDER — KETOROLAC TROMETHAMINE 30 MG/ML IJ SOLN
30.0000 mg | Freq: Once | INTRAMUSCULAR | Status: AC
  Administered 2015-12-20: 30 mg via INTRAMUSCULAR
  Filled 2015-12-20: qty 1

## 2015-12-20 MED ORDER — METHYLPREDNISOLONE 4 MG PO TBPK: ORAL_TABLET | ORAL | Status: DC

## 2015-12-20 MED ORDER — CYCLOBENZAPRINE HCL 10 MG PO TABS: 10.0000 mg | ORAL_TABLET | Freq: Three times a day (TID) | ORAL | Status: DC | PRN

## 2015-12-20 MED ORDER — CYCLOBENZAPRINE HCL 10 MG PO TABS
5.0000 mg | ORAL_TABLET | Freq: Once | ORAL | Status: AC
  Administered 2015-12-20: 5 mg via ORAL
  Filled 2015-12-20: qty 1
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
## 2015-12-20 NOTE — Discharge Instructions (Signed)

## 2015-12-20 NOTE — ED Notes (Signed)
States he bend over to pick something up and felt a pain in his lower back

## 2016-01-20 IMAGING — CT CT HEAD W/O CM
3 series · 18 of 30 positions shown, 20 images · non-contrast
Comparison: None.

CLINICAL DATA: Seizure

EXAM:
CT HEAD WITHOUT CONTRAST
TECHNIQUE: Contiguous axial images were obtained from the base of the skull
through the vertex without intravenous contrast.

[Series 2: head w/o · axial · non-contrast · 0.47mm/px · z∈[-4,+116]mm · 6 of 34 slices shown, 8 images]
[im 5/34  brain]
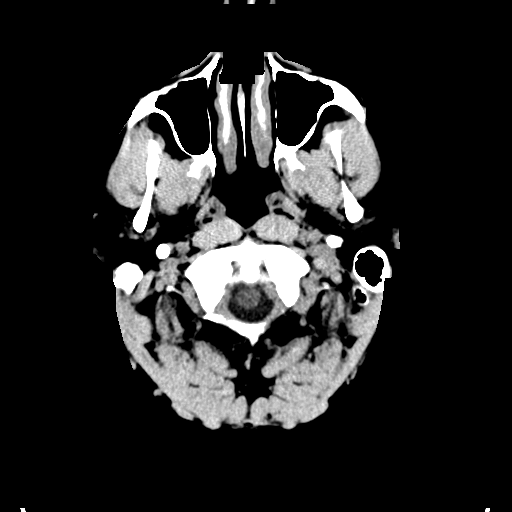
[im 5/34  bone]
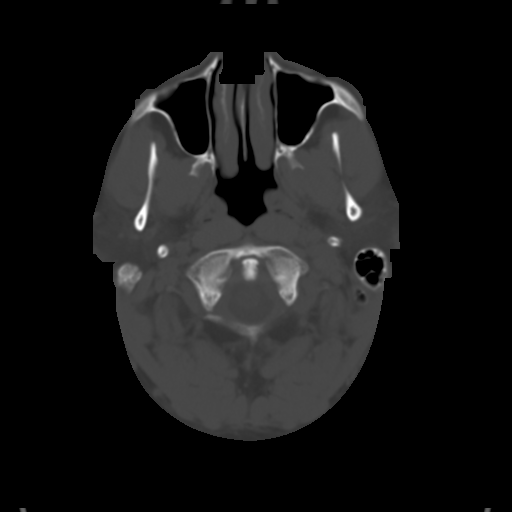
[im 10/34  brain]
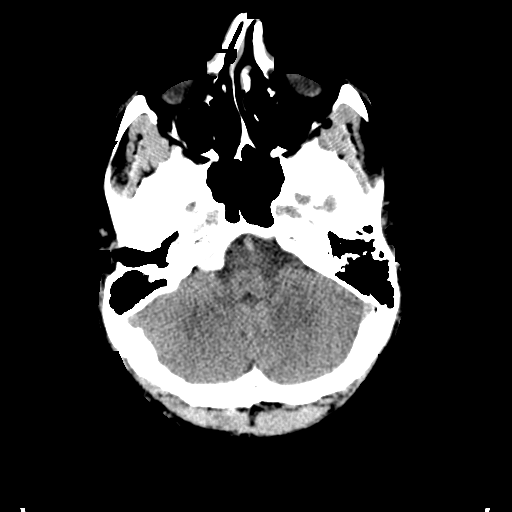
[im 15/34  brain]
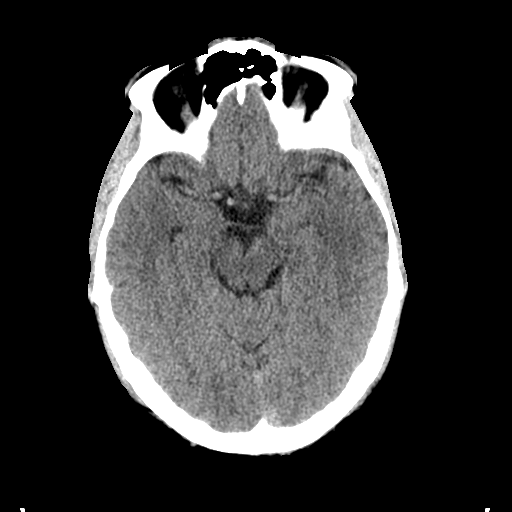
[im 19/34  brain]
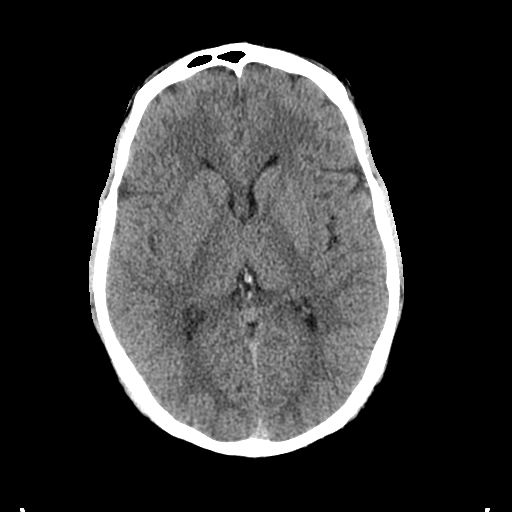
[im 24/34  brain]
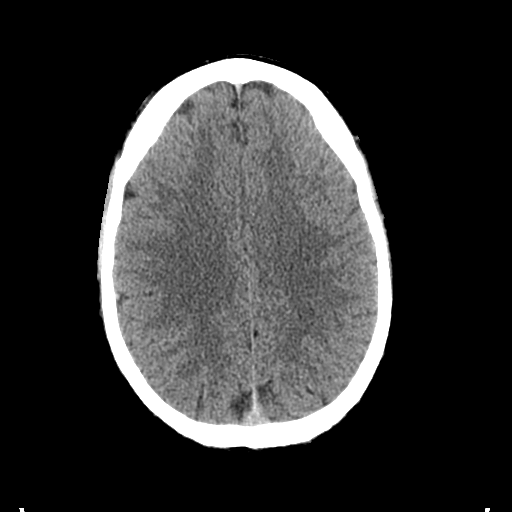
[im 24/34  bone]
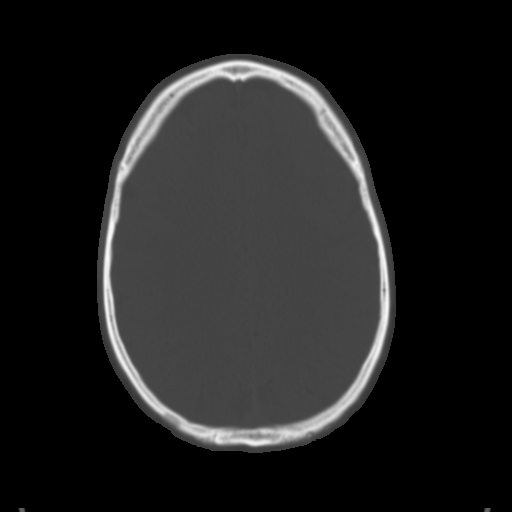
[im 29/34  brain]
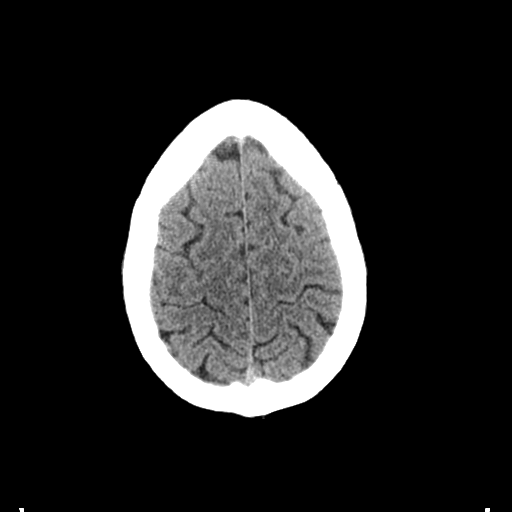

[Series 3: bone windows · axial · 0.47mm/px · z∈[-4,+66]mm · 4 of 34 slices shown (1 of 2)]
[im 5/34  bone]
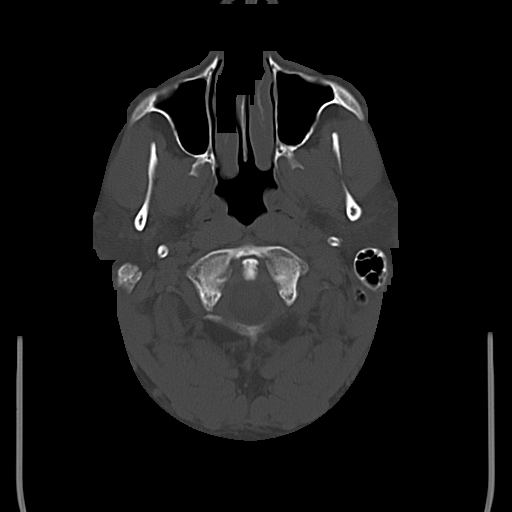
[im 10/34  bone]
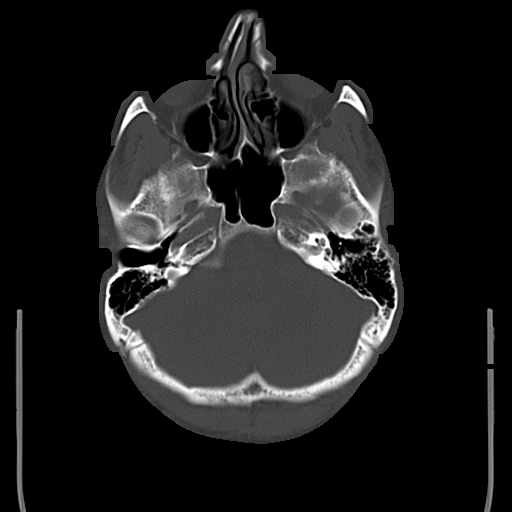
[im 15/34  bone]
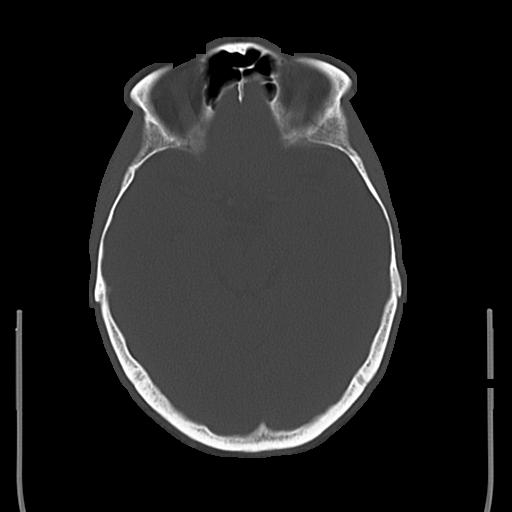
[im 19/34  bone]
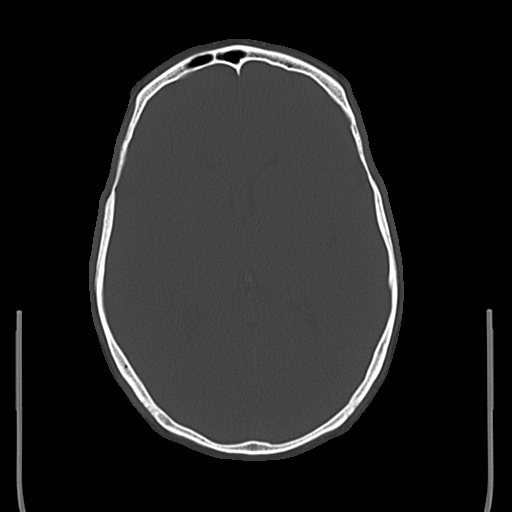

[Series 4: bone windows · axial · 0.47mm/px · z∈[-12,+126]mm · 8 of 56 slices shown (2 of 2)]
[im 5/56  bone]
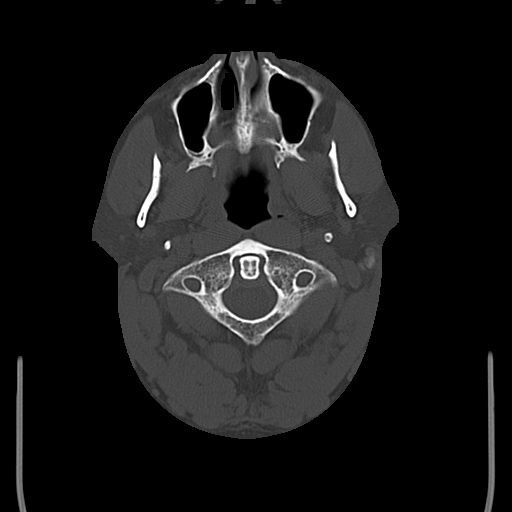
[im 13/56  bone]
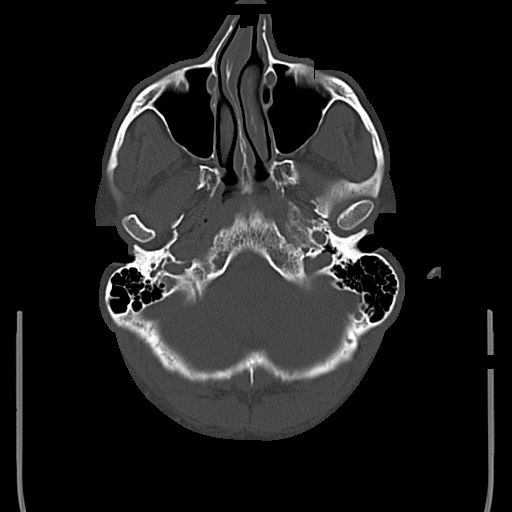
[im 17/56  bone]
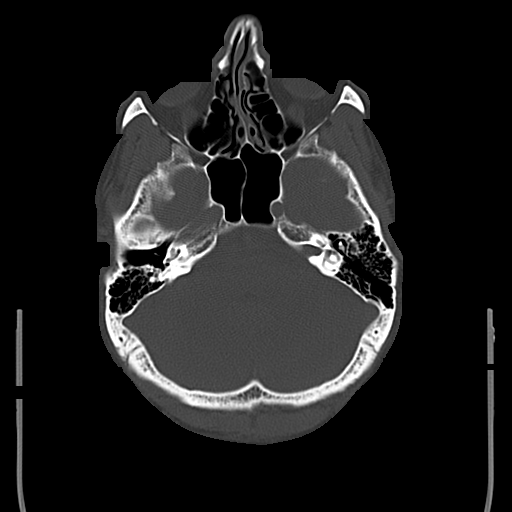
[im 26/56  bone]
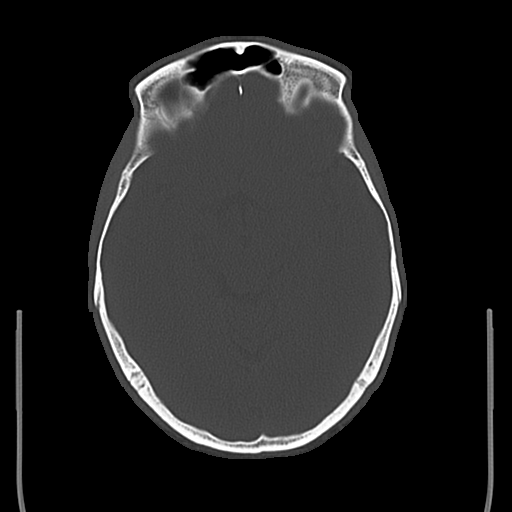
[im 30/56  bone]
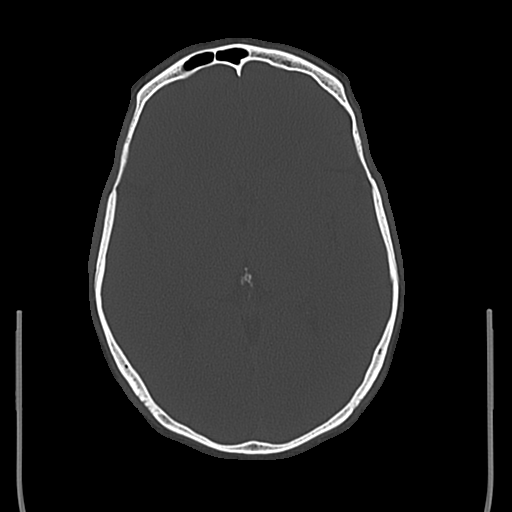
[im 39/56  bone]
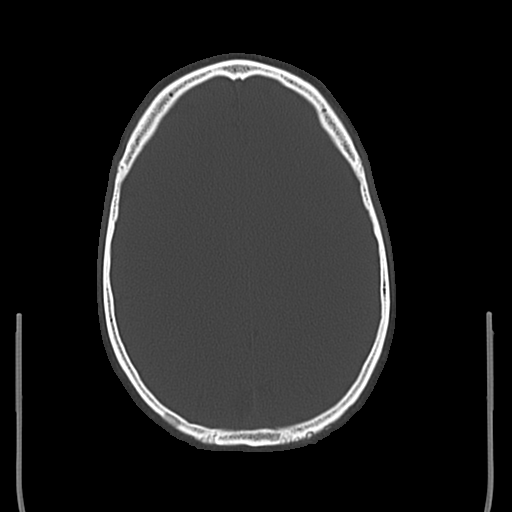
[im 43/56  bone]
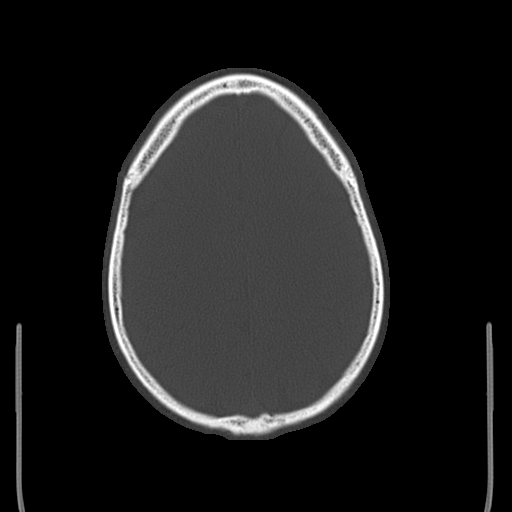
[im 51/56  bone]
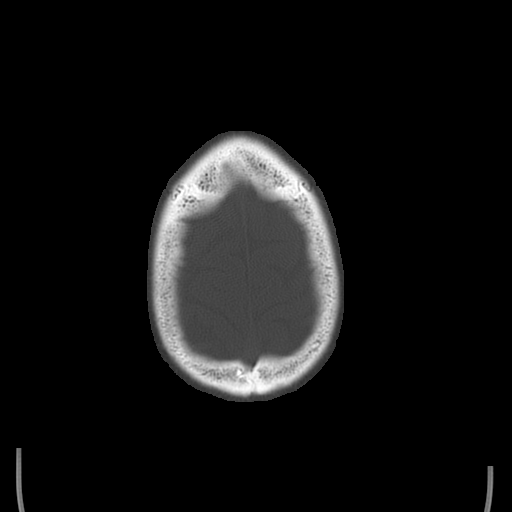

[18 of 30 positions shown; findings below may reference images not displayed]

FINDINGS: The ventricles are normal in size and configuration. There is no
mass, hemorrhage, extra-axial fluid collection, or midline shift.
Gray-white compartments appear normal. No demonstrable acute
infarct. Bony calvarium appears intact. The mastoid air cells are
clear.
IMPRESSION: Study within normal limits.

## 2016-03-09 ENCOUNTER — Emergency Department (HOSPITAL_COMMUNITY)
Admission: EM | Admit: 2016-03-09 | Discharge: 2016-03-09 | Disposition: A | Discharge status: Home / Self Care | Payer: Self-pay | Attending: Emergency Medicine | Admitting: Emergency Medicine

## 2016-03-09 ENCOUNTER — Encounter (HOSPITAL_COMMUNITY): Payer: Self-pay | Admitting: *Deleted

## 2016-03-09 ENCOUNTER — Emergency Department (HOSPITAL_COMMUNITY): Payer: Self-pay

## 2016-03-09 DIAGNOSIS — R079 Chest pain, unspecified: Secondary | ICD-10-CM | POA: Insufficient documentation

## 2016-03-09 DIAGNOSIS — M79671 Pain in right foot: Secondary | ICD-10-CM | POA: Insufficient documentation

## 2016-03-09 DIAGNOSIS — M546 Pain in thoracic spine: Secondary | ICD-10-CM | POA: Insufficient documentation

## 2016-03-09 DIAGNOSIS — M79672 Pain in left foot: Secondary | ICD-10-CM | POA: Insufficient documentation

## 2016-03-09 DIAGNOSIS — F17200 Nicotine dependence, unspecified, uncomplicated: Secondary | ICD-10-CM | POA: Insufficient documentation

## 2016-03-09 DIAGNOSIS — N189 Chronic kidney disease, unspecified: Secondary | ICD-10-CM | POA: Insufficient documentation

## 2016-03-09 LAB — CBC
HCT: 45.8 % (ref 39.0–52.0)
HEMOGLOBIN: 15.8 g/dL (ref 13.0–17.0)
MCH: 30.5 pg (ref 26.0–34.0)
MCHC: 34.5 g/dL (ref 30.0–36.0)
MCV: 88.4 fL (ref 78.0–100.0)
PLATELETS: 240 10*3/uL (ref 150–400)
RBC: 5.18 MIL/uL (ref 4.22–5.81)
RDW: 12.3 % (ref 11.5–15.5)
WBC: 9.2 10*3/uL (ref 4.0–10.5)

## 2016-03-09 LAB — URINALYSIS, ROUTINE W REFLEX MICROSCOPIC
BILIRUBIN URINE: NEGATIVE
Glucose, UA: NEGATIVE mg/dL
Hgb urine dipstick: NEGATIVE
KETONES UR: NEGATIVE mg/dL
NITRITE: NEGATIVE
PROTEIN: NEGATIVE mg/dL
Specific Gravity, Urine: 1.034 — ABNORMAL HIGH (ref 1.005–1.030)
pH: 5.5 (ref 5.0–8.0)

## 2016-03-09 LAB — BASIC METABOLIC PANEL
Anion gap: 8 (ref 5–15)
BUN: 19 mg/dL (ref 6–20)
CALCIUM: 9.9 mg/dL (ref 8.9–10.3)
CHLORIDE: 103 mmol/L (ref 101–111)
CO2: 27 mmol/L (ref 22–32)
Creatinine, Ser: 1.16 mg/dL (ref 0.61–1.24)
GFR calc non Af Amer: >60 mL/min (ref >60)
Glucose, Bld: 105 mg/dL — ABNORMAL HIGH (ref 65–99)
Potassium: 3.7 mmol/L (ref 3.5–5.1)
Sodium: 138 mmol/L (ref 135–145)

## 2016-03-09 LAB — I-STAT TROPONIN, ED: TROPONIN I, POC: 0 ng/mL (ref 0.00–0.08)

## 2016-03-09 LAB — URINE MICROSCOPIC-ADD ON

## 2016-03-09 MED ORDER — CYCLOBENZAPRINE HCL 10 MG PO TABS: 10.0000 mg | ORAL_TABLET | Freq: Two times a day (BID) | ORAL | Status: DC | PRN

## 2016-03-09 MED ORDER — NAPROXEN 500 MG PO TABS: 500.0000 mg | ORAL_TABLET | Freq: Two times a day (BID) | ORAL | Status: DC

## 2016-03-09 NOTE — ED Notes (Signed)
Patient states he has been having pain to the left side of his neck that travels to his back and through to the chest.  Also c/o hand pain (unable to get it to work at times) feet pain

## 2016-03-09 NOTE — ED Provider Notes (Signed)
CSN: [id number]     Arrival date & time 03/09/16  0149 History   First MD Initiated Contact with Patient 03/09/16 0220     Chief Complaint  Patient presents with  . Chest Pain  . Hand Pain  . Numerous complaints      (Consider location/radiation/quality/duration/timing/severity/associated sxs/prior Treatment) HPI Comments: Patient presents with 4 day history of symptoms including upper back pain bilaterally, worse with movement; chills without known fever; intermittent, long term sharp pain in the plantar area of mostly left foot, worse than before; diffuse joint pain without redness or warmth. No nausea, vomiting, diarrhea, congestion, cough.   Patient is a 40 y.o. male presenting with hand pain. The history is provided by the patient. No language interpreter was used.  Hand Pain Associated symptoms include chest pain (Pain in upper back radiates through to chest. ) and chills. Pertinent negatives include no congestion, coughing, fever, numbness or weakness.    Past Medical History  Diagnosis Date  . Esophageal ulcer   . Renal disorder     CKD   History reviewed. No pertinent past surgical history. Family History  Problem Relation Age of Onset  . COPD Mother    Social History  Substance Use Topics  . Smoking status: Current Every Day Smoker  . Smokeless tobacco: Never Used     Comment: Smoking 1 ppd  . Alcohol Use: Yes    Review of Systems  Constitutional: Positive for chills. Negative for fever, activity change and appetite change.  HENT: Negative.  Negative for congestion.   Respiratory: Negative.  Negative for cough and shortness of breath.   Cardiovascular: Positive for chest pain (Pain in upper back radiates through to chest. ).  Gastrointestinal: Negative.   Musculoskeletal: Positive for back pain.       See HPI.  Skin: Negative.   Neurological: Negative.  Negative for weakness and numbness.      Allergies  Review of patient's allergies indicates no known  allergies.  Home Medications   Prior to Admission medications   Medication Sig Start Date End Date Taking? Authorizing Provider  bacitracin ointment Apply 1 application topically 2 (two) times daily. 09/25/14   Antony Madura, PA-C  cyclobenzaprine (FLEXERIL) 10 MG tablet Take 1 tablet (10 mg total) by mouth 3 (three) times daily as needed for muscle spasms. 12/20/15   Shon Baton, MD  ibuprofen (ADVIL,MOTRIN) 200 MG tablet Take 800 mg by mouth every 6 (six) hours as needed for moderate pain.    Historical Provider, MD  methylPREDNISolone (MEDROL DOSEPAK) 4 MG TBPK tablet Take as directed on packet 12/20/15   Shon Baton, MD   BP 124/81 mmHg  Pulse 70  Temp(Src) 98.6 F (37 C) (Oral)  Resp 18  SpO2 99% Physical Exam  Constitutional: He is oriented to person, place, and time. He appears well-developed and well-nourished.  HENT:  Head: Normocephalic.  Neck: Normal range of motion. Neck supple.  Cardiovascular: Normal rate and regular rhythm.   Pulmonary/Chest: Effort normal and breath sounds normal. He has no wheezes. He has no rales.  Abdominal: Soft. Bowel sounds are normal. There is no tenderness. There is no rebound and no guarding.  Musculoskeletal: Normal range of motion.  TTP bilateral upper thoracic area. No swelling. FROM UE's. No cervical tenderness. Feet bilaterally unremarkable in appearance and essentially non-tender. No swelling of joints generally, no redness or warmth of any joint. No limitation of ROM.  Neurological: He is alert and oriented to person, place, and  time.  Skin: Skin is warm and dry. No rash noted.  Psychiatric: He has a normal mood and affect.    ED Course  Procedures (including critical care time) Labs Review Labs Reviewed  BASIC METABOLIC PANEL - Abnormal; Notable for the following:    Glucose, Bld 105 (*)    All other components within normal limits  URINALYSIS, ROUTINE W REFLEX MICROSCOPIC (NOT AT [facility]) - Abnormal; Notable for the  following:    APPearance CLOUDY (*)    Specific Gravity, Urine 1.034 (*)    Leukocytes, UA SMALL (*)    All other components within normal limits  URINE MICROSCOPIC-ADD ON - Abnormal; Notable for the following:    Squamous Epithelial / LPF 0-5 (*)    Bacteria, UA RARE (*)    All other components within normal limits  CBC  I-STAT TROPOININ, ED    Imaging Review Dg Chest 2 View  03/09/2016  CLINICAL DATA:  Mid chest pain through to the back. Pain for 3-4 days. EXAM: CHEST  2 VIEW COMPARISON:  06/17/2008 FINDINGS: The heart size and mediastinal contours are within normal limits. Both lungs are clear. The visualized skeletal structures are unremarkable. IMPRESSION: No active cardiopulmonary disease. Electronically Signed   By: Burman Nieves M.D.   On: 03/09/2016 02:49   I have personally reviewed and evaluated these images and lab results as part of my medical decision-making.   EKG Interpretation None      MDM   Final diagnoses:  None    1. Body aches 2. Back pain  Patient presents with multiple complaints, including chills and diffuse body aches. No infectious source identified. VSS. He can be discharged home with PCP follow up.     Elpidio Anis, PA-C 03/09/16 [id number]  Arby Barrette, MD 03/10/16 1359

## 2016-03-09 NOTE — Discharge Instructions (Signed)

## 2016-03-19 ENCOUNTER — Other Ambulatory Visit: Payer: Self-pay | Admitting: Physician Assistant

## 2016-03-19 ENCOUNTER — Ambulatory Visit: Payer: Self-pay | Attending: Physician Assistant | Admitting: Physician Assistant

## 2016-03-19 VITALS — BP 127/79 | HR 98 | Temp 99.0°F | Resp 16 | Ht 70.0 in | Wt 213.0 lb

## 2016-03-19 DIAGNOSIS — M62838 Other muscle spasm: Secondary | ICD-10-CM | POA: Insufficient documentation

## 2016-03-19 DIAGNOSIS — Z79899 Other long term (current) drug therapy: Secondary | ICD-10-CM | POA: Insufficient documentation

## 2016-03-19 DIAGNOSIS — R52 Pain, unspecified: Secondary | ICD-10-CM

## 2016-03-19 DIAGNOSIS — M25561 Pain in right knee: Secondary | ICD-10-CM | POA: Insufficient documentation

## 2016-03-19 DIAGNOSIS — N342 Other urethritis: Secondary | ICD-10-CM

## 2016-03-19 DIAGNOSIS — M25562 Pain in left knee: Secondary | ICD-10-CM | POA: Insufficient documentation

## 2016-03-19 LAB — CBC WITH DIFFERENTIAL/PLATELET
BASOS PCT: 0 %
Basophils Absolute: 0 cells/uL (ref 0–200)
EOS ABS: 86 {cells}/uL (ref 15–500)
Eosinophils Relative: 1 %
HEMATOCRIT: 45.6 % (ref 38.5–50.0)
Hemoglobin: 15.8 g/dL (ref 13.2–17.1)
LYMPHS ABS: 1892 {cells}/uL (ref 850–3900)
Lymphocytes Relative: 22 %
MCH: 29.9 pg (ref 27.0–33.0)
MCHC: 34.6 g/dL (ref 32.0–36.0)
MCV: 86.2 fL (ref 80.0–100.0)
MONO ABS: 860 {cells}/uL (ref 200–950)
MPV: 9.1 fL (ref 7.5–12.5)
Monocytes Relative: 10 %
NEUTROS ABS: 5762 {cells}/uL (ref 1500–7800)
Neutrophils Relative %: 67 %
Platelets: 298 10*3/uL (ref 140–400)
RBC: 5.29 MIL/uL (ref 4.20–5.80)
RDW: 12.9 % (ref 11.0–15.0)
WBC: 8.6 10*3/uL (ref 3.8–10.8)

## 2016-03-19 LAB — POCT URINALYSIS DIPSTICK
Glucose, UA: NEGATIVE
Ketones, UA: NEGATIVE
Leukocytes, UA: NEGATIVE
NITRITE UA: NEGATIVE
Protein, UA: 30
RBC UA: NEGATIVE
SPEC GRAV UA: 1.025
Urobilinogen, UA: 0.2
pH, UA: 5.5

## 2016-03-19 MED ORDER — DOXYCYCLINE HYCLATE 100 MG PO TABS: 100.0000 mg | ORAL_TABLET | Freq: Two times a day (BID) | ORAL | Status: DC

## 2016-03-19 MED ORDER — METHOCARBAMOL 500 MG PO TABS: 1000.0000 mg | ORAL_TABLET | Freq: Four times a day (QID) | ORAL | Status: DC

## 2016-03-19 MED ORDER — NAPROXEN 500 MG PO TABS: 500.0000 mg | ORAL_TABLET | Freq: Two times a day (BID) | ORAL | Status: DC

## 2016-03-19 MED FILL — DOXYCYCLINE 100 MG TABLET: 100 | 10 days supply | Qty: 20 | Fill #0

## 2016-03-19 MED FILL — NAPROXEN 500 MG TABLET: 500 | 15 days supply | Qty: 30 | Fill #0

## 2016-03-19 MED FILL — METHOCARBAMOL 500 MG TABLET: 500 | 15 days supply | Qty: 120 | Fill #0

## 2016-03-19 NOTE — Progress Notes (Signed)
Patient ID: Richard Bowman, male   DOB: May 24, 1976, 40 y.o.   MRN: [id number]   Richard Bowman, is a 40 y.o. male  [id number]  [id number]  DOB - 1976-03-15  Subjective:  Chief Complaint and HPI: Richard Bowman is a 40 y.o. male here today for a f/up visit from body aches seen in the ED 03/09/2016.  He c/o progressive body aches for about 2 months now.  Pain started in his L foot and is now present in both feet, both knees, his hands, and his upper back.  Pain is worse after being sedentary and when he is trying to get moving after being still.  He is unsure about a tick bite, but he did find a tick crawling on him and does do some outdoor work.  He denies f/c.  He has noticed his urine being "frothy" a few times.  (There were leukocytes in his urine at the ED).  Denies dysuria.  He is here with his girlfriend.   Reviewed recent ED notes and labs.   ROS:   Constitutional:  No f/c, No night sweats, No unexplained weight loss. EENT:  No vision changes, No blurry vision, No hearing changes. No mouth, throat, or ear problems.  Respiratory: No cough, No SOB Cardiac: No CP, no palpitations GI:  No abd pain, No N/V/D. GU: No Urinary s/sx Musculoskeletal: +diffuse joint pain Neuro: No headache, no dizziness, no motor weakness.  Skin: No rash Endocrine:  No polydipsia. No polyuria.  Psych: Denies SI/HI  No problems updated.  ALLERGIES: No Known Allergies  PAST MEDICAL HISTORY: Past Medical History  Diagnosis Date  . Esophageal ulcer   . Renal disorder     CKD    MEDICATIONS AT HOME: Prior to Admission medications   Medication Sig Start Date End Date Taking? Authorizing Provider  cyclobenzaprine (FLEXERIL) 10 MG tablet Take 1 tablet (10 mg total) by mouth 2 (two) times daily as needed for muscle spasms. 03/09/16  Yes Shari Upstill, PA-C  naproxen (NAPROSYN) 500 MG tablet Take 1 tablet (500 mg total) by mouth 2 (two) times daily. 03/19/16  Yes Marzella Schlein Shakaya Bhullar, PA-C  bacitracin  ointment Apply 1 application topically 2 (two) times daily. 09/25/14   Antony Madura, PA-C  doxycycline (VIBRA-TABS) 100 MG tablet Take 1 tablet (100 mg total) by mouth 2 (two) times daily. 03/19/16   Anders Simmonds, PA-C  ibuprofen (ADVIL,MOTRIN) 200 MG tablet Take 800 mg by mouth every 6 (six) hours as needed for moderate pain.    Historical Provider, MD  methocarbamol (ROBAXIN) 500 MG tablet Take 2 tablets (1,000 mg total) by mouth 4 (four) times daily. 03/19/16   Anders Simmonds, PA-C     Objective:  EXAM:   Filed Vitals:   03/19/16 1024  BP: 127/79  Pulse: 98  Temp: 99 F (37.2 C)  TempSrc: Oral  Resp: 16  Height:  (1.778 m)  Weight: 213 lb (96.616 kg)  SpO2: 97%    General appearance : A&OX3. NAD. Non-toxic-appearing HEENT: Atraumatic and Normocephalic.  PERRLA. EOM intact. . Neck: supple, no JVD. No cervical lymphadenopathy. No thyromegaly Chest/Lungs:  Breathing-non-labored, Good air entry bilaterally, breath sounds normal without rales, rhonchi, or wheezing  CVS: S1 S2 regular, no murmurs, gallops, rubs  Extremities/feet/ankles/hands: WNL.  Normal S&ROM w/o instability or gross abnormality. Bilateral Lower Ext shows no edema, both legs are warm to touch with = pulse throughout. DTR=throughout.  No babinski. Normal S&ROM of U&L ext B.  Back:  TTP paraspinus muscles thoracic spine.  No bony TTP Neurology:  CN II-XII grossly intact, Non focal.  No babinski Psych:  TP linear. J/I WNL. Normal speech. Appropriate eye contact and affect.  Skin:  No Rash  Data Review Lab Results  Component Value Date   HGBA1C 5.6 06/12/2014     Assessment & Plan   1. Body aches Unknown etiology.  R/O tick borne illness/inflammatory causes - Sedimentation Rate - CBC with Differential/Platelet - [place] spotted fvr ab, IgM-blood - B. burgdorfi antibodies - naproxen (NAPROSYN) 500 MG tablet; Take 1 tablet (500 mg total) by mouth 2 (two) times daily.  Dispense: 30 tablet; Refill:  0  2. Possible rethritis-leukocytes on UA dip in ED - POCT urinalysis dipstick - Urine culture - GC/chlamydia probe amp, urine - doxycycline (VIBRA-TABS) 100 MG tablet; Take 1 tablet (100 mg total) by mouth 2 (two) times daily.  Dispense: 20 tablet; Refill: 0  3. Muscle spasm - methocarbamol (ROBAXIN) 500 MG tablet; Take 2 tablets (1,000 mg total) by mouth 4 (four) times daily.  Dispense: 120 tablet; Refill: 0  Patient have been counseled extensively about nutrition and exercise F/up predicated on labs  The patient was given clear instructions to go to ER or return to medical center if symptoms don't improve, worsen or new problems develop. The patient verbalized understanding. The patient was told to call to get lab results if they haven't heard anything in the next week.     Georgian Co, PA-C [facility] and [facility] [place], [place] [phone]   03/19/2016, 11:37 AM

## 2016-03-19 NOTE — Progress Notes (Signed)
Foot/back pain for one month and half. Previously seen in [facility] ED and treated with muscle relaxer, prednisone, and (1) hydrocodone.  States does experience tingling in legs and arms intermittently.  Pollyann Kennedy, RN, BSN

## 2016-03-20 LAB — GC/CHLAMYDIA PROBE AMP
CT Probe RNA: NOT DETECTED
GC Probe RNA: NOT DETECTED

## 2016-03-20 LAB — URINE CULTURE: Organism ID, Bacteria: NO GROWTH

## 2016-03-20 LAB — SEDIMENTATION RATE: Sed Rate: 34 mm/hr — ABNORMAL HIGH (ref 0–15)

## 2016-03-24 ENCOUNTER — Telehealth: Payer: Self-pay | Admitting: General Practice

## 2016-03-24 NOTE — Telephone Encounter (Signed)
Clld pt  - advsd of lab results. Pt stated he understood. 

## 2016-03-24 NOTE — Telephone Encounter (Signed)
Patient is requesting lab results.

## 2016-03-24 NOTE — Telephone Encounter (Signed)
-----   Message from Anders Simmonds, [place] sent at 03/23/2016 12:29 PM EDT ----- Please call patient.  His urine culture came back negative. His inflammatory rate was a little high, but his other blood work has been normal so far. I am still awaiting his results for tick-related illnesses and will let him know what the next step is once we get those back.  Thanks, Georgian Co, PA-C

## 2016-03-25 ENCOUNTER — Telehealth: Payer: Self-pay | Admitting: Internal Medicine

## 2016-03-25 LAB — LYME ABY, WSTRN BLT IGG & IGM W/BANDS
B BURGDORFERI IGM ABS (IB): NEGATIVE
B burgdorferi IgG Abs (IB): NEGATIVE
LYME DISEASE 23 KD IGM: NONREACTIVE
LYME DISEASE 28 KD IGG: NONREACTIVE
LYME DISEASE 39 KD IGM: NONREACTIVE
LYME DISEASE 66 KD IGG: NONREACTIVE
LYME DISEASE 93 KD IGG: REACTIVE — AB
Lyme Disease 18 kD IgG: NONREACTIVE
Lyme Disease 23 kD IgG: NONREACTIVE
Lyme Disease 30 kD IgG: NONREACTIVE
Lyme Disease 39 kD IgG: NONREACTIVE
Lyme Disease 41 kD IgG: NONREACTIVE
Lyme Disease 41 kD IgM: NONREACTIVE
Lyme Disease 45 kD IgG: NONREACTIVE
Lyme Disease 58 kD IgG: NONREACTIVE

## 2016-03-25 NOTE — Telephone Encounter (Signed)
Pt called requesting to speak to nurse regarding results for lyme's disease, pt states abx treatment has not been working states he feels the same. Please f/up

## 2016-03-26 ENCOUNTER — Telehealth: Payer: Self-pay

## 2016-03-26 NOTE — Telephone Encounter (Signed)
Contacted pt with his lyme disease results pt is aware of results and is schedule with Dr. Renella Cunas on April 02, 2016 

## 2016-03-26 NOTE — Telephone Encounter (Signed)
Will forward to Angela

## 2016-03-26 NOTE — Telephone Encounter (Signed)
Pt. Called requesting his results for lyme disease.  Please f/u

## 2016-04-02 ENCOUNTER — Encounter: Payer: Self-pay | Admitting: Internal Medicine

## 2016-04-02 ENCOUNTER — Ambulatory Visit: Payer: Self-pay | Attending: Internal Medicine | Admitting: Internal Medicine

## 2016-04-02 VITALS — BP 108/64 | HR 75 | Temp 98.7°F | Resp 18 | Ht 70.0 in | Wt 213.4 lb

## 2016-04-02 DIAGNOSIS — N189 Chronic kidney disease, unspecified: Secondary | ICD-10-CM | POA: Insufficient documentation

## 2016-04-02 DIAGNOSIS — M25562 Pain in left knee: Secondary | ICD-10-CM | POA: Insufficient documentation

## 2016-04-02 DIAGNOSIS — M62838 Other muscle spasm: Secondary | ICD-10-CM | POA: Insufficient documentation

## 2016-04-02 DIAGNOSIS — Z791 Long term (current) use of non-steroidal anti-inflammatories (NSAID): Secondary | ICD-10-CM | POA: Insufficient documentation

## 2016-04-02 DIAGNOSIS — Z79899 Other long term (current) drug therapy: Secondary | ICD-10-CM | POA: Insufficient documentation

## 2016-04-02 DIAGNOSIS — M2550 Pain in unspecified joint: Secondary | ICD-10-CM | POA: Insufficient documentation

## 2016-04-02 DIAGNOSIS — Z720 Tobacco use: Secondary | ICD-10-CM

## 2016-04-02 DIAGNOSIS — F17200 Nicotine dependence, unspecified, uncomplicated: Secondary | ICD-10-CM

## 2016-04-02 NOTE — Progress Notes (Signed)
Patient is here for Joint Pain  Patient complains of upper back and neck pain being present. Pain is described as sharp pain. Patient complains of stiff hands in the morning and edema throughout the day.  Patient complains of knee and hip pain.  Patient has taken medication today and patient has eaten.  Patient request a refill on Flexeril.

## 2016-04-02 NOTE — Patient Instructions (Signed)

## 2016-04-02 NOTE — Progress Notes (Signed)
Richard Bowman, is a 40 y.o. male  [id number]  [id number]  DOB - 06-10-1976  Chief Complaint  Patient presents with  . Joint Pain        Subjective:   Richard Bowman is a 40 y.o. male here today for a follow up visit. Patient was here recently for complaints of joint pains and generalized body aches. He was seen earlier in the ED for the same but did not achieve any relief with a prescription medication. His pain started in his left foot and had gradually extended to both knees, hands and upper back. He has occasional swelling. He was evaluated for possible tick bite/Lyme disease, results were equivocal but was treated anyway. She has not yet achieved any relief. He is here today for further evaluation of this pain and occasional swelling in his joints. He denies any night sweats, no fever. He has no injury or fall. No family history of autoimmune disorders. Patient has No headache, No chest pain, No abdominal pain - No Nausea, No new weakness tingling or numbness, No Cough - SOB.  Problem  Joint Pain  Muscle Spasm    ALLERGIES: No Known Allergies  PAST MEDICAL HISTORY: Past Medical History:  Diagnosis Date  . Esophageal ulcer   . Renal disorder    CKD    MEDICATIONS AT HOME: Prior to Admission medications   Medication Sig Start Date End Date Taking? Authorizing Provider  cyclobenzaprine (FLEXERIL) 10 MG tablet Take 1 tablet (10 mg total) by mouth 2 (two) times daily as needed for muscle spasms. 03/09/16  Yes Shari Upstill, PA-C  doxycycline (VIBRA-TABS) 100 MG tablet Take 1 tablet (100 mg total) by mouth 2 (two) times daily. 03/19/16  Yes Marzella Schlein McClung, PA-C  methocarbamol (ROBAXIN) 500 MG tablet Take 2 tablets (1,000 mg total) by mouth 4 (four) times daily. 03/19/16  Yes Marzella Schlein McClung, PA-C  naproxen (NAPROSYN) 500 MG tablet Take 1 tablet (500 mg total) by mouth 2 (two) times daily. 03/19/16  Yes Marzella Schlein McClung, PA-C  ibuprofen (ADVIL,MOTRIN) 200 MG tablet Take  800 mg by mouth every 6 (six) hours as needed for moderate pain.    Historical Provider, MD     Objective:   Vitals:   04/02/16 1517  BP: 108/64  Pulse: 75  Resp: 18  Temp: 98.7 F (37.1 C)  TempSrc: Oral  SpO2: 97%  Weight: 213 lb 6.4 oz (96.8 kg)  Height:  (1.778 m)    Exam General appearance : Awake, alert, not in any distress. Speech Clear. Not toxic looking HEENT: Atraumatic and Normocephalic, pupils equally reactive to light and accomodation Neck: Supple, no JVD. No cervical lymphadenopathy.  Chest: Good air entry bilaterally, no added sounds  CVS: S1 S2 regular, no murmurs.  Abdomen: Bowel sounds present, Non tender and not distended with no gaurding, rigidity or rebound. Extremities: B/L Lower Ext shows no edema, both legs are warm to touch Neurology: Awake alert, and oriented X 3, CN II-XII intact, Non focal Skin: No Rash  Data Review Lab Results  Component Value Date   HGBA1C 5.6 06/12/2014     Assessment & Plan   1. Joint pain  - Acute Hep Panel & Hep B Surface Ab - HIV antibody (with reflex) - ANA, IFA Comprehensive Panel - RNP Antibody - Rheumatoid factor - Cyclic Citrul Peptide Antibody, IGG - RPR  2. Smoker  Richard Bowman was counseled on the dangers of tobacco use, and was advised to quit. Reviewed strategies to  maximize success, including removing cigarettes and smoking materials from environment, stress management and support of family/friends.  Patient have been counseled extensively about nutrition and exercise  Return in about 4 weeks (around 04/30/2016) for Follow up Pain and comorbidities.  The patient was given clear instructions to go to ER or return to medical center if symptoms don't improve, worsen or new problems develop. The patient verbalized understanding. The patient was told to call to get lab results if they haven't heard anything in the next week.   This note has been created with Transport planner. Any transcriptional errors are unintentional.    Jeanann Lewandowsky, MD, MHA, FACP, FAAP, CPE [facility] [place], [place] [phone]   04/02/2016, 4:07 PM

## 2016-04-03 ENCOUNTER — Other Ambulatory Visit: Payer: Self-pay | Admitting: Internal Medicine

## 2016-04-03 LAB — RNP ANTIBODY: RIBONUCLEIC PROTEIN(ENA) ANTIBODY, IGG: NEGATIVE

## 2016-04-03 LAB — ANA, IFA COMPREHENSIVE PANEL
Anti Nuclear Antibody(ANA): NEGATIVE
ENA SM Ab Ser-aCnc: <1
SCLERODERMA (SCL-70) (ENA) ANTIBODY, IGG: NEGATIVE
SM/RNP: <1
SSA (Ro) (ENA) Antibody, IgG: <1
SSB (La) (ENA) Antibody, IgG: <1
ds DNA Ab: 2 IU/mL

## 2016-04-03 LAB — ACUTE HEP PANEL AND HEP B SURFACE AB
HCV Ab: NEGATIVE
HEP B S AG: NEGATIVE
Hep A IgM: NONREACTIVE
Hep B C IgM: NONREACTIVE
Hep B S Ab: NEGATIVE

## 2016-04-03 LAB — CYCLIC CITRUL PEPTIDE ANTIBODY, IGG

## 2016-04-03 LAB — HIV ANTIBODY (ROUTINE TESTING W REFLEX): HIV 1&2 Ab, 4th Generation: NONREACTIVE

## 2016-04-03 LAB — RPR

## 2016-04-03 LAB — RHEUMATOID FACTOR: Rhuematoid fact SerPl-aCnc: <10 IU/mL (ref <=14)

## 2016-04-03 MED ORDER — PREDNISONE 10 MG PO TABS: 10.0000 mg | ORAL_TABLET | Freq: Two times a day (BID) | ORAL | 0 refills | Status: AC

## 2016-04-15 ENCOUNTER — Telehealth: Payer: Self-pay | Admitting: Internal Medicine

## 2016-04-15 NOTE — Telephone Encounter (Signed)
Pt. Called stating that he has mold in the house is renting and he does not feel good. Pt. States he looked in the Internet the side effects of being near mold and states he has the symptoms. Please f/u with pt.

## 2016-04-16 NOTE — Telephone Encounter (Signed)
Return call to patient at [phone], hipaa verif per guidelines. Patient states he has appointment with PCP to further discuss concerns; advised patient "no matter what type of mold is present, you should arrange for its removal or may need to find new residence if possible.  Advised patient sometimes a specialist may be required such as Infectious disease, allergist or pulmonary physician.  That would be depended on PCP recommendations.  Also advised CDC.gov as resource to read about mold recommendations and etc. Patient verbalized understanding.  No further questions at this time. Pollyann Kennedy, RN, BSN

## 2016-04-22 ENCOUNTER — Encounter: Payer: Self-pay | Admitting: Internal Medicine

## 2016-04-22 ENCOUNTER — Ambulatory Visit: Payer: Self-pay | Attending: Internal Medicine | Admitting: Internal Medicine

## 2016-04-22 VITALS — BP 109/66 | HR 73 | Temp 98.4°F | Resp 18 | Ht 70.0 in | Wt 212.0 lb

## 2016-04-22 DIAGNOSIS — N189 Chronic kidney disease, unspecified: Secondary | ICD-10-CM | POA: Insufficient documentation

## 2016-04-22 DIAGNOSIS — Z77120 Contact with and (suspected) exposure to mold (toxic): Secondary | ICD-10-CM | POA: Insufficient documentation

## 2016-04-22 DIAGNOSIS — M2550 Pain in unspecified joint: Secondary | ICD-10-CM

## 2016-04-22 DIAGNOSIS — R52 Pain, unspecified: Secondary | ICD-10-CM

## 2016-04-22 DIAGNOSIS — Z79899 Other long term (current) drug therapy: Secondary | ICD-10-CM | POA: Insufficient documentation

## 2016-04-22 MED ORDER — MELOXICAM 15 MG PO TABS: 15.0000 mg | ORAL_TABLET | Freq: Every day | ORAL | 1 refills | Status: DC

## 2016-04-22 NOTE — Progress Notes (Signed)
Patient is here for FU Body Edema  Patient complains of joint pain. Pain is described as intermittent sharp pains. Constant aching scaled at a 6.  Patient has taken ibuprofen today. Patient has not eaten.  Patient declined the flu shot today.

## 2016-04-22 NOTE — Progress Notes (Signed)
Richard Bowman, is a 40 y.o. male  [id number]  [id number]  DOB - July 02, 1976  Chief Complaint  Patient presents with  . Edema        Subjective:   Richard Bowman is a 40 y.o. male here today for a follow up visit. Patient still has significant pain and stiffness in his joints especially hands and elbow joints. All work-ups for autoimmune diseases negative so far. Patient is asking if his symptoms could be related to [facility] because he has significant mold exposure at home. He does not have any upper respiratory symptom, no wheezing. He lives with his wife and her daughter, no similar symptom. He has no fever, no night sweat. No joint swelling, no redness, no falls. He occasionally have puffiness of his hands and its difficult to make a fist due to pain. Patient has No headache, No chest pain, No abdominal pain - No Nausea, No new weakness tingling or numbness, No Cough - SOB.  Problem  Body Aches    ALLERGIES: No Known Allergies  PAST MEDICAL HISTORY: Past Medical History:  Diagnosis Date  . Esophageal ulcer   . Renal disorder    CKD    MEDICATIONS AT HOME: Prior to Admission medications   Medication Sig Start Date End Date Taking? Authorizing Provider  cyclobenzaprine (FLEXERIL) 10 MG tablet Take 1 tablet (10 mg total) by mouth 2 (two) times daily as needed for muscle spasms. 03/09/16  Yes Shari Upstill, PA-C  doxycycline (VIBRA-TABS) 100 MG tablet Take 1 tablet (100 mg total) by mouth 2 (two) times daily. 03/19/16  Yes Marzella SchleinAngela M McClung, PA-C  ibuprofen (ADVIL,MOTRIN) 200 MG tablet Take 800 mg by mouth every 6 (six) hours as needed for moderate pain.   Yes Historical Provider, MD  methocarbamol (ROBAXIN) 500 MG tablet Take 2 tablets (1,000 mg total) by mouth 4 (four) times daily. 03/19/16  Yes Anders SimmondsAngela M McClung, PA-C  meloxicam (MOBIC) 15 MG tablet Take 1 tablet (15 mg total) by mouth daily. 04/22/16   Quentin Angstlugbemiga E My Rinke, MD     Objective:   Vitals:   04/22/16 0919    BP: 109/66  Pulse: 73  Resp: 18  Temp: 98.4 F (36.9 C)  TempSrc: Oral  SpO2: 97%  Weight: 212 lb (96.2 kg)  Height: 5\' 10"  (1.778 m)    Exam General appearance : Awake, alert, not in any distress. Speech Clear. Not toxic looking HEENT: Atraumatic and Normocephalic, pupils equally reactive to light and accomodation Neck: Supple, no JVD. No cervical lymphadenopathy.  Chest: Good air entry bilaterally, no added sounds  CVS: S1 S2 regular, no murmurs.  Abdomen: Bowel sounds present, Non tender and not distended with no gaurding, rigidity or rebound. Extremities: B/L Lower Ext shows no edema, both legs are warm to touch Neurology: Awake alert, and oriented X 3, CN II-XII intact, Non focal Skin: No Rash  Data Review Lab Results  Component Value Date   HGBA1C 5.6 06/12/2014     Assessment & Plan   1. Joint pain  - meloxicam (MOBIC) 15 MG tablet; Take 1 tablet (15 mg total) by mouth daily.  Dispense: 30 tablet; Refill: 1 - Allergen Profile, Mold  - Will refer to Rheumatologist for further evaluation and work-up.  2. Body aches  - meloxicam (MOBIC) 15 MG tablet; Take 1 tablet (15 mg total) by mouth daily.  Dispense: 30 tablet; Refill: 1 - Allergen Profile, Mold  Patient have been counseled extensively about nutrition and exercise  Return in about  3 months (around 07/23/2016), or if symptoms worsen or fail to improve, for Joint Pains.  The patient was given clear instructions to go to ER or return to medical center if symptoms don't improve, worsen or new problems develop. The patient verbalized understanding. The patient was told to call to get lab results if they haven't heard anything in the next week.   This note has been created with Education officer, environmental. Any transcriptional errors are unintentional.    Jeanann Lewandowsky, MD, MHA, Maxwell Caul, CPE [facility] and [facility] [place], [place] [phone]    04/22/2016, 9:59 AM

## 2016-04-22 NOTE — Addendum Note (Signed)
Addended by: Jeanann Lewandowsky E on: 04/22/2016 10:05 AM   Modules accepted: Orders

## 2016-04-22 NOTE — Addendum Note (Signed)
Addended by: Margaretmary Lombard on: 04/22/2016 10:23 AM   Modules accepted: Orders

## 2016-04-22 NOTE — Patient Instructions (Signed)

## 2016-04-23 LAB — ALLERGEN PROFILE, MOLD
Allergen, A. alternata, m6: <0.1 kU/L
Allergen, Mucor Racemosus, M4: <0.1 kU/L
Allergen, S. Botryosum, m10: <0.1 kU/L

## 2016-04-28 ENCOUNTER — Telehealth: Payer: Self-pay | Admitting: Internal Medicine

## 2016-04-28 NOTE — Telephone Encounter (Signed)
Pt. Called requesting his lab results. Please f.u °

## 2016-04-29 NOTE — Telephone Encounter (Signed)
Pt. Called requesting his Lab results. Pt. Also states that his hands are swollen and that he was prescribed pain medication and does not want to take it b/c he can deal with the pain. Please f/u with pt.

## 2016-05-05 ENCOUNTER — Telehealth: Payer: Self-pay | Admitting: Internal Medicine

## 2016-05-05 NOTE — Telephone Encounter (Signed)
Patient verified DOB Patient is aware of lab results being negative for Mold and patient is aware of PCP wanting patient to schedule and keep appointment with rheumatologist. Patient is self-pay and received the options for affordable specialist. Patient was advised to take pain medications as prescribed to assist in discomfort. No further questions at this time.

## 2016-05-05 NOTE — Telephone Encounter (Signed)
-----   Message from Quentin Angst, MD sent at 04/27/2016  5:09 PM EDT ----- Please inform patient that he tested negative for Mold Allergens. Keep appointment with Rheumatologist when one is made.

## 2016-05-05 NOTE — Telephone Encounter (Signed)
Pt. Returned call. Please f/u °

## 2016-05-05 NOTE — Telephone Encounter (Signed)
Medical Assistant left message on patient's home and cell voicemail. Voicemail states to give a call back to My Rinke with [facility] at [phone].  

## 2018-03-02 ENCOUNTER — Encounter (HOSPITAL_COMMUNITY): Payer: Self-pay | Admitting: Emergency Medicine

## 2018-03-02 ENCOUNTER — Emergency Department (HOSPITAL_COMMUNITY)
Admission: EM | Admit: 2018-03-02 | Discharge: 2018-03-02 | Disposition: A | Discharge status: Home / Self Care | Payer: Self-pay | Attending: Emergency Medicine | Admitting: Emergency Medicine

## 2018-03-02 ENCOUNTER — Other Ambulatory Visit: Payer: Self-pay

## 2018-03-02 ENCOUNTER — Emergency Department (HOSPITAL_COMMUNITY): Payer: Self-pay

## 2018-03-02 DIAGNOSIS — S62639A Displaced fracture of distal phalanx of unspecified finger, initial encounter for closed fracture: Secondary | ICD-10-CM

## 2018-03-02 DIAGNOSIS — Y990 Civilian activity done for income or pay: Secondary | ICD-10-CM | POA: Insufficient documentation

## 2018-03-02 DIAGNOSIS — Y9389 Activity, other specified: Secondary | ICD-10-CM | POA: Insufficient documentation

## 2018-03-02 DIAGNOSIS — S6010XA Contusion of unspecified finger with damage to nail, initial encounter: Secondary | ICD-10-CM

## 2018-03-02 DIAGNOSIS — S62665A Nondisplaced fracture of distal phalanx of left ring finger, initial encounter for closed fracture: Secondary | ICD-10-CM | POA: Insufficient documentation

## 2018-03-02 DIAGNOSIS — F17200 Nicotine dependence, unspecified, uncomplicated: Secondary | ICD-10-CM | POA: Insufficient documentation

## 2018-03-02 DIAGNOSIS — Y9289 Other specified places as the place of occurrence of the external cause: Secondary | ICD-10-CM | POA: Insufficient documentation

## 2018-03-02 DIAGNOSIS — S60142A Contusion of left ring finger with damage to nail, initial encounter: Secondary | ICD-10-CM | POA: Insufficient documentation

## 2018-03-02 DIAGNOSIS — W270XXA Contact with workbench tool, initial encounter: Secondary | ICD-10-CM | POA: Insufficient documentation

## 2018-03-02 MED ORDER — IBUPROFEN 200 MG PO TABS
600.0000 mg | ORAL_TABLET | Freq: Once | ORAL | Status: AC
  Administered 2018-03-02: 600 mg via ORAL
  Filled 2018-03-02: qty 3

## 2018-03-02 MED ORDER — NAPROXEN 500 MG PO TABS: 500.0000 mg | ORAL_TABLET | Freq: Two times a day (BID) | ORAL | 0 refills | Status: DC

## 2018-03-02 NOTE — Discharge Instructions (Signed)
Wear the splint for comfort, follow up with Dr. Melvyn Novas, return here for any problems.

## 2018-03-02 NOTE — ED Provider Notes (Signed)
[facility]-EMERGENCY DEPT Provider Note   CSN: [id number] Arrival date & time: 03/02/18  1329     History   Chief Complaint Chief Complaint  Patient presents with  . Finger Injury    HPI Richard Bowman is a 42 y.o. male who presents to the ED with a finger injury. Patient reports that last night he was working and hit his left ring finger with a hammer. He had trouble sleeping last night due to the pain. Patient went to work today and hit the finger again and came to the ED.  HPI  Past Medical History:  Diagnosis Date  . Esophageal ulcer   . Renal disorder    CKD    Patient Active Problem List   Diagnosis Date Noted  . Body aches 04/22/2016  . Joint pain 04/02/2016  . Muscle spasm 04/02/2016  . Smoker 06/12/2014  . Seizure-like activity (HCC) 06/12/2014    History reviewed. No pertinent surgical history.      Home Medications    Prior to Admission medications   Medication Sig Start Date End Date Taking? Authorizing Provider  ibuprofen (ADVIL,MOTRIN) 200 MG tablet Take 400 mg by mouth every 6 (six) hours as needed for moderate pain.    Yes [provider]  naproxen (NAPROSYN) 500 MG tablet Take 1 tablet (500 mg total) by mouth 2 (two) times daily. 03/02/18   Janne Napoleon, NP    Family History Family History  Problem Relation Age of Onset  . COPD Mother   . Cancer Mother     Social History Social History   Tobacco Use  . Smoking status: Current Every Day Smoker    Packs/day: 0.50  . Smokeless tobacco: Never Used  . Tobacco comment: Smoking 1 ppd  Substance Use Topics  . Alcohol use: Yes    Alcohol/week: 0.0 oz    Comment: occ  . Drug use: No     Allergies   Patient has no known allergies.   Review of Systems Review of Systems  Musculoskeletal: Positive for arthralgias.       Left finger injury  All other systems reviewed and are negative.    Physical Exam Updated Vital Signs BP 130/84 (BP Location: Left  Arm)   Pulse 65   Temp 98 F (36.7 C) (Oral)   Resp 18   SpO2 98%   Physical Exam  Constitutional: He appears well-developed and well-nourished. No distress.  HENT:  Head: Normocephalic.  Eyes: EOM are normal.  Neck: Neck supple.  Cardiovascular: Normal rate.  Pulmonary/Chest: Effort normal.  Musculoskeletal:       Left hand: He exhibits tenderness and swelling. He exhibits no deformity and no laceration. Decreased range of motion: due to swelling and pain. Normal sensation noted. Normal strength noted.       Hands: Subungual hematoma to the left ring finger and swelling and ecchymosis to the palmar aspect of the distal finger.  Neurological: He is alert.  Skin: Skin is warm and dry.  Psychiatric: He has a normal mood and affect.  Nursing note and vitals reviewed.    ED Treatments / Results  Labs (all labs ordered are listed, but only abnormal results are displayed) Labs Reviewed - No data to display  Radiology Dg Finger Ring Left  Result Date: 03/02/2018 CLINICAL DATA:  Hit finger with hammer. EXAM: LEFT RING FINGER 2+V COMPARISON:  None. FINDINGS: Three views study of the left ring finger shows a comminuted fracture involving the tuft  of the distal phalanx. PIP joint is located. No worrisome lytic or sclerotic osseous abnormality. IMPRESSION: Comminuted tuft fracture of the distal phalanx. Electronically Signed   By: Kennith Center M.D.   On: 03/02/2018 14:49    Procedures area cleaned, finger soaked in NSS, using cautery stick made hole in the nail and released blood from under the nail.  Patient tolerated the procedure without any immediate problems.  Splint applied to finger. Patient stable for d/c without focal neuro deficits. F/u with PCP or ortho. Referral given.  Procedures (including critical care time)  Medications Ordered in ED Medications  ibuprofen (ADVIL,MOTRIN) tablet 600 mg (600 mg Oral Given 03/02/18 1416)     Initial Impression / Assessment and Plan / ED  Course  I have reviewed the triage vital signs and the nursing notes.  Final Clinical Impressions(s) / ED Diagnoses   Final diagnoses:  Subungual hematoma of digit of hand, initial encounter  Closed fracture of tuft of distal phalanx of finger    ED Discharge Orders        Ordered    naproxen (NAPROSYN) 500 MG tablet  2 times daily     03/02/18 1524       Damian Leavell [place], NP 03/02/18 1713    Terrilee Files, MD 03/03/18 [phone]

## 2018-03-02 NOTE — ED Triage Notes (Signed)
Pt has swelling and discoloration in the 4th finger of l/hand. Struck finger with handle of hammer last night. Tx will ice and motrin.No open wound noted

## 2018-03-02 NOTE — ED Notes (Signed)
Splint applied to left finger. PMS intact. Patient verbalized understanding of discharge instructions, no questions. Patient ambulated out of ED with steady gait in no distress.

## 2020-09-20 ENCOUNTER — Encounter (HOSPITAL_COMMUNITY): Payer: Self-pay | Admitting: *Deleted

## 2020-09-20 ENCOUNTER — Emergency Department (HOSPITAL_COMMUNITY)
Admission: EM | Admit: 2020-09-20 | Discharge: 2020-09-20 | Disposition: A | Discharge status: Home / Self Care | Payer: Self-pay | Attending: Emergency Medicine | Admitting: Emergency Medicine

## 2020-09-20 ENCOUNTER — Other Ambulatory Visit: Payer: Self-pay

## 2020-09-20 DIAGNOSIS — N179 Acute kidney failure, unspecified: Secondary | ICD-10-CM | POA: Insufficient documentation

## 2020-09-20 DIAGNOSIS — F17200 Nicotine dependence, unspecified, uncomplicated: Secondary | ICD-10-CM | POA: Insufficient documentation

## 2020-09-20 DIAGNOSIS — M79602 Pain in left arm: Secondary | ICD-10-CM | POA: Insufficient documentation

## 2020-09-20 DIAGNOSIS — R202 Paresthesia of skin: Secondary | ICD-10-CM | POA: Insufficient documentation

## 2020-09-20 MED ORDER — NAPROXEN 500 MG PO TABS: 500.0000 mg | ORAL_TABLET | Freq: Two times a day (BID) | ORAL | 0 refills | Status: AC

## 2020-09-20 MED ORDER — NAPROXEN 500 MG PO TABS
500.0000 mg | ORAL_TABLET | Freq: Once | ORAL | Status: AC
  Administered 2020-09-20: 500 mg via ORAL
  Filled 2020-09-20: qty 1

## 2020-09-20 NOTE — Discharge Instructions (Addendum)
I prescribed a short course of anti-inflammatories to help with pain, please take 1 tablet twice a day for the next 7 days.  Suspect your pain is likely residual from the IV site.  This should improve in the next few days.  If you experience worsening symptoms please return to the emergency room.

## 2020-09-20 NOTE — ED Triage Notes (Signed)
Pt complains of left arm/shoulder pain since giving plasma on 1/11. He noticed the pain after the IV needle was inserted into the antecubital area of his left arm.

## 2020-09-20 NOTE — ED Provider Notes (Signed)
[facility]-EMERGENCY DEPT Provider Note   CSN: [id number] Arrival date & time: 09/20/20  1214     History Chief Complaint  Patient presents with  . Arm Pain    Richard Bowman is a 45 y.o. male.  45 y.o male with no PMH presents to the ED with a chief complaint of left AC pain s/p blood donation x 10 days. Patient reports he donated blood 10 days ago and "the nurse that did it did not know what she was doing", he reports pain to the injection site which has not resolved despite heat/ice. He reports feeling tingling around the site along with pain "around the muscle". He states there is a similar episode to this which have been previously after donating blood, however symptoms resolved after a week.  He is concerned as the symptoms have been ongoing for more than 7 days.  He reports no fever, no pain imaging of the left elbow, no prior history of IV drug use.  No other injuries or falls.  The history is provided by the patient.       Past Medical History:  Diagnosis Date  . Esophageal ulcer   . Renal disorder    CKD    Patient Active Problem List   Diagnosis Date Noted  . Body aches 04/22/2016  . Joint pain 04/02/2016  . Muscle spasm 04/02/2016  . Smoker 06/12/2014  . Seizure-like activity (HCC) 06/12/2014    History reviewed. No pertinent surgical history.     Family History  Problem Relation Age of Onset  . COPD Mother   . Cancer Mother     Social History   Tobacco Use  . Smoking status: Current Every Day Smoker    Packs/day: 0.50  . Smokeless tobacco: Never Used  . Tobacco comment: Smoking 1 ppd  Substance Use Topics  . Alcohol use: Yes    Alcohol/week: 0.0 standard drinks    Comment: occ  . Drug use: No    Home Medications Prior to Admission medications   Medication Sig Start Date End Date Taking? Authorizing Provider  naproxen (NAPROSYN) 500 MG tablet Take 1 tablet (500 mg total) by mouth 2 (two) times daily for 7 days.  09/20/20 09/27/20 Yes Bellina Tokarczyk, PA-C  ibuprofen (ADVIL,MOTRIN) 200 MG tablet Take 400 mg by mouth every 6 (six) hours as needed for moderate pain.     [provider]    Allergies    Patient has no known allergies.  Review of Systems   Review of Systems  Constitutional: Negative for fever.  Musculoskeletal: Positive for arthralgias.  Skin: Negative for color change, rash and wound.    Physical Exam Updated Vital Signs BP (!) 145/91 (BP Location: Left Arm)   Pulse 70   Temp 98.4 F (36.9 C)   Resp 18   SpO2 94%   Physical Exam Vitals and nursing note reviewed.  Constitutional:      Appearance: Normal appearance.  HENT:     Head: Normocephalic and atraumatic.  Pulmonary:     Effort: Pulmonary effort is normal.  Abdominal:     General: Abdomen is flat.  Musculoskeletal:     Left elbow: No swelling, deformity, effusion or lacerations. Normal range of motion. No tenderness. No radial head, medial epicondyle, lateral epicondyle or olecranon process tenderness.     Cervical back: Normal range of motion and neck supple.     Comments: Pulses present, no palpable tenderness. Sensation is intact. No pain along the  medial or lateral epicondyle.   Skin:    General: Skin is warm and dry.     Findings: No bruising, erythema or lesion.  Neurological:     Mental Status: He is alert and oriented to person, place, and time.     ED Results / Procedures / Treatments   Labs (all labs ordered are listed, but only abnormal results are displayed) Labs Reviewed - No data to display  EKG None  Radiology No results found.  Procedures Procedures (including critical care time)  Medications Ordered in ED Medications  naproxen (NAPROSYN) tablet 500 mg (500 mg Oral Given 09/20/20 1447)    ED Course  I have reviewed the triage vital signs and the nursing notes.  Pertinent labs & imaging results that were available during my care of the patient were reviewed by me and  considered in my medical decision making (see chart for details).    MDM Rules/Calculators/A&P   Patient here with left AC pain status post blood donation.  Reports it has been ongoing for the past 10 days.  Has only tried ice and heat to the area.  No prior history of IV drug use, no fevers, no limited range of motion of the left joint.  Does report the percentage of his blood "did not know what they were doing ".  During evaluation of his left AC, there is no redness, no streaking in the skin, no changes consistent with cellulitis.  He has full range of motion of the left elbow, no warmth or erythema noted, lower suspicion for septic joint.  No prior history of IV drug use at this time.  No swelling noted to the distal or proximal aspect of the arm, there is some pain noted at the left upper shoulder however he does have full range of motion of this.  Vitals are within normal limits.  We discussed treatment with anti-inflammatories.  I have a lower suspicion for cellulitis, or septic joint, or DVT. Likely residual pain from IV site.   He does report a similar episode in the past in which he varies pain for about a week however this later resolved.I have prescribed a short course of anti-inflammatories to help with his symptoms.  He is to take this on a daily basis.  He is otherwise stable for discharge.  Return precautions discussed at length.   Portions of this note were generated with Scientist, clinical (histocompatibility and immunogenetics). Dictation errors may occur despite best attempts at proofreading.  Final Clinical Impression(s) / ED Diagnoses Final diagnoses:  Left arm pain    Rx / DC Orders ED Discharge Orders         Ordered    naproxen (NAPROSYN) 500 MG tablet  2 times daily        09/20/20 1449           Claude Manges, PA-C 09/20/20 1455    Melene Plan, DO 09/20/20 1543

## 2020-09-23 ENCOUNTER — Other Ambulatory Visit: Payer: Self-pay

## 2020-09-23 ENCOUNTER — Emergency Department (HOSPITAL_COMMUNITY)
Admission: EM | Admit: 2020-09-23 | Discharge: 2020-09-24 | Disposition: A | Discharge status: Home / Self Care | Payer: Self-pay | Attending: Emergency Medicine | Admitting: Emergency Medicine

## 2020-09-23 ENCOUNTER — Encounter (HOSPITAL_COMMUNITY): Payer: Self-pay | Admitting: *Deleted

## 2020-09-23 DIAGNOSIS — M79602 Pain in left arm: Secondary | ICD-10-CM | POA: Insufficient documentation

## 2020-09-23 DIAGNOSIS — M25512 Pain in left shoulder: Secondary | ICD-10-CM | POA: Insufficient documentation

## 2020-09-23 DIAGNOSIS — Z5321 Procedure and treatment not carried out due to patient leaving prior to being seen by health care provider: Secondary | ICD-10-CM | POA: Insufficient documentation

## 2020-09-23 NOTE — ED Notes (Signed)
Called pt name x4 for VS recheck. No response from pt.  

## 2020-09-23 NOTE — ED Triage Notes (Signed)
Pt reports donating plasma two weeks ago and still has intermittent pain to left arm and shoulder. Had need inserted left AC. No acute distress is noted at triage.

## 2020-09-24 ENCOUNTER — Other Ambulatory Visit: Payer: Self-pay | Admitting: Physician Assistant

## 2020-09-24 ENCOUNTER — Encounter: Payer: Self-pay | Admitting: Physician Assistant

## 2020-09-24 ENCOUNTER — Ambulatory Visit (INDEPENDENT_AMBULATORY_CARE_PROVIDER_SITE_OTHER): Payer: Self-pay | Admitting: Physician Assistant

## 2020-09-24 VITALS — BP 135/83 | HR 70 | Temp 98.4°F | Resp 18 | Ht 70.0 in | Wt 217.0 lb

## 2020-09-24 DIAGNOSIS — M79602 Pain in left arm: Secondary | ICD-10-CM

## 2020-09-24 MED ORDER — IBUPROFEN 600 MG PO TABS: 600.0000 mg | ORAL_TABLET | Freq: Three times a day (TID) | ORAL | 0 refills | Status: DC | PRN

## 2020-09-24 MED FILL — IBUPROFEN 600 MG TABLET: 600 | 10 days supply | Qty: 30 | Fill #0

## 2020-09-24 NOTE — Patient Instructions (Signed)
[facility], I encourage you to use this along with ice to help you with your left arm pain.  Please let us know if there is anything else we can do for you  Roney Jaffe, PA-C Physician Assistant [facility] [email or web address]    Phlebitis Phlebitis is soreness and swelling of a vein. This can occur in the arms, legs, or torso, as well as deeper inside the body. Phlebitis is usually not serious when it occurs close to the surface of the body. However, it can cause serious problems when it occurs deeper inside the body. What are the causes? Phlebitis can be caused by:  Having a needle, IV, or long, thin tube (catheter) put in the vein.  Getting certain medicines or solutions through an IV. Some medicines or solutions, such as antibiotics and cancer medicines, can irritate the vein.  Having an IV for a long period of time or in a part of the body that moves a lot.  A blood clot.  Infection of the vein.  Surgery on a vein. What increases the risk? The following factors may make you more likely to develop this condition:  Being overweight or obese.  Pregnancy.  Cancer.  Reduced or slowing of blood flow through your veins. This can be caused by: ? Bed rest over a long period of time. ? Long-distance travel. ? Injury. ? Surgery. ? Congestive heart failure. ? Inactivity.  Smoking.  Taking birth control pills or hormone replacement therapy.  Varicose veins.  Inflammatory diseases or blood disorders that increase clotting.  Taking drugs through the vein.  Having a history of blood clots. What are the signs or symptoms? Symptoms of this condition include:  A red, tender, swollen, and painful area on your skin. Usually, the area is long and narrow, and it may spread.  The affected area feeling warm to the touch.  Firmness along the center of the  affected area.  Low fever. How is this diagnosed? This condition is diagnosed based on:  Your symptoms.  A physical exam.  Your medical history, including your family history. Tests may be done to rule out other conditions, such as blood clots, especially if you have a history of blood clots or are at higher risk of developing blood clots. These may include:  Blood tests.  Ultrasound exam.  Genetic tests.  Biopsy. This is when a tissue sample is removed from the body and looked at under a microscope. This is rare. How is this treated? Treatment depends on the severity of the condition as well as the location of the inflammation. In most cases, the condition is minor and gets better quickly. Treatment may include:  Applying a warm, wet cloth (warm compress) or heating pad.  Wearing compression stockings or bandages.  Medicines, such as: ? Anti-inflammatory medicines. ? Antibiotic medicines if an infection is present. ? Blood-thinning medicines if a blood clot is suspected or present, or if you have a history of blood clots or a blood disorder.  Removing an IV that may be causing the problem.  Using a different medicine or solution that will not irritate the vein. In rare cases, surgery may be needed to remove a damaged section of a vein. Follow these instructions at home: Managing pain, stiffness, and swelling  If directed, apply heat to the affected area as often as told by your health care provider. Use the heat source that your health care provider  recommends, such as a moist heat pack or a heating pad. ? Place a towel between your skin and the heat source. ? Leave the heat on for 20-30 minutes. ? Remove the heat if your skin turns bright red. This is especially important if you are unable to feel pain, heat, or cold. You have a greater risk of getting burned.  Raise (elevate) the affected area above the level of your heart while you are sitting or lying down.    Medicines  Take over-the-counter and prescription medicines only as told by your health care provider.  If you were prescribed an antibiotic, take it as told by your health care provider. Do not stop using the antibiotic even if you start to feel better.  If you are taking blood thinners: ? Talk with your health care provider before you take any medicines that contain aspirin or NSAIDs, such as ibuprofen. These medicines increase your risk for dangerous bleeding. ? Take your medicine exactly as told, at the same time every day. ? Avoid activities that could cause injury or bruising, and follow instructions about how to prevent falls. ? Wear a medical alert bracelet or carry a card that lists what medicines you take. General instructions  If you have phlebitis in your legs: ? Avoid sitting or lying down for a long time. ? Keep your legs moving. ? Try to take short walks to break up long periods of sitting. ? Try to avoid bed rest that lasts for long periods of time. Regular sleep is not part of bed rest.  Wear compression stockings or bandages as told by your health care provider. These stockings reduce swelling in your legs and help to prevent blood clots. They also help to prevent the condition from coming back.  Do not use any products that contain nicotine or tobacco. These products include cigarettes, chewing tobacco, and vaping devices, such as e-cigarettes. If you need help quitting, ask your health care provider.  Keep all follow-up visits. This is important. This may include any follow-up blood tests. Contact a health care provider if:  You have unusual bruising or any bleeding problems.  Your symptoms do not get better, or your symptoms get worse.  You are on anti-inflammatory medicines and you develop abdominal pain or dark, tarry stools. Get help right away if:  You suddenly have chest pain or trouble breathing.  You have a fever and your symptoms suddenly get  worse.  You cough up blood.  You feel light-headed or you faint.  You have severe pain and swelling in the affected arm or leg. These symptoms may represent a serious problem that is an emergency. Do not wait to see if the symptoms will go away. Get medical help right away. Call your local emergency services (911 in the [place] Do not drive yourself to the hospital. Summary  Phlebitis is soreness and swelling of a vein.  Phlebitis is usually not serious when it occurs close to the surface of the body. However, it can cause serious problems when it occurs deeper inside the body.  Treatment depends on the severity of the condition and the location of the inflammation.  Raise (elevate) the affected area above the level of your heart while you are sitting or lying down. This information is not intended to replace advice given to you by your health care provider. Make sure you discuss any questions you have with your health care provider. Document Revised: 04/15/2020 Document Reviewed: 04/15/2020 Elsevier Patient Education  [place].

## 2020-09-24 NOTE — ED Notes (Signed)
Patient called for vitals recheck with no response 

## 2020-09-24 NOTE — Progress Notes (Signed)
Patient has not used medication today. Patient has eaten today. Patient reports donating plasma for the first time on 09/10/20. Patient reports he could "feel the needle on the other side" after nurse inserted the needle. Patient shares shortly after beginning the donation be noticed a hematoma and asked for assistance. Patient reports the nurse stopping the donation at that time. Patient shares he was bruised on the inside of the arm, the antecubital space and his thumb. Patient reports numbing and tingling beginning constantly as the bruising subsided over the past 2 weeks.

## 2020-09-24 NOTE — Progress Notes (Signed)
New Patient Office Visit  Subjective:  Patient ID: Richard Bowman, male    DOB: May 26, 1976  Age: 45 y.o. MRN: [id number]  CC:  Chief Complaint  Patient presents with  . Numbness    Left Arm    HPI Richard Bowman reports that he has been having left arm pain since he donated plasma on 09/10/2020.  Reports that he had bruising, could feel a knot in the area of the injection, and had numbness and tingling for the past 2 weeks.  Reports that bruising has resolved, numbness and tingling has mostly resolved.  Reports that he has been using ice and heat in the area.  Reports that he was seen at the emergency department for same complaint on January 21,2022.  Reports that he was given naproxen at that visit, but unfortunately he did not fill the medication.  Hospital note:  Patient here with left AC pain status post blood donation.  Reports it has been ongoing for the past 10 days.  Has only tried ice and heat to the area.  No prior history of IV drug use, no fevers, no limited range of motion of the left joint.  Does report the percentage of his blood "did not know what they were doing ".  During evaluation of his left AC, there is no redness, no streaking in the skin, no changes consistent with cellulitis.  He has full range of motion of the left elbow, no warmth or erythema noted, lower suspicion for septic joint.  No prior history of IV drug use at this time.  No swelling noted to the distal or proximal aspect of the arm, there is some pain noted at the left upper shoulder however he does have full range of motion of this.  Vitals are within normal limits.  We discussed treatment with anti-inflammatories.  I have a lower suspicion for cellulitis, or septic joint, or DVT. Likely residual pain from IV site.   He does report a similar episode in the past in which he varies pain for about a week however this later resolved.I have prescribed a short course of anti-inflammatories to help with his  symptoms.  He is to take this on a daily basis.  He is otherwise stable for discharge.  Return precautions discussed at length.   Reports today that he did not start the medication, states that he was unable to afford it.  States that he continues to have left arm pain, but does endorse that it feels like it is improving.  Reports that he continues to have some numbness and tingling in his fingers and thumb, but does state it is intermittent.  Past Medical History:  Diagnosis Date  . Esophageal ulcer   . Renal disorder    CKD    History reviewed. No pertinent surgical history.  Family History  Problem Relation Age of Onset  . COPD Mother   . Cancer Mother     Social History   Socioeconomic History  . Marital status: Single    Spouse name: Not on file  . Number of children: Not on file  . Years of education: Not on file  . Highest education level: Not on file  Occupational History  . Not on file  Tobacco Use  . Smoking status: Current Every Day Smoker    Packs/day: 0.50  . Smokeless tobacco: Never Used  . Tobacco comment: Smoking 1 ppd  Substance and Sexual Activity  . Alcohol use: Yes  Alcohol/week: 0.0 standard drinks    Comment: occ  . Drug use: No  . Sexual activity: Not on file  Other Topics Concern  . Not on file  Social History Narrative  . Not on file   Social Determinants of Health   Financial Resource Strain: Not on file  Food Insecurity: Not on file  Transportation Needs: Not on file  Physical Activity: Not on file  Stress: Not on file  Social Connections: Not on file  Intimate Partner Violence: Not on file    ROS Review of Systems  Constitutional: Negative for chills and fever.  HENT: Negative.   Eyes: Negative.   Respiratory: Negative for shortness of breath.   Cardiovascular: Negative for chest pain.  Gastrointestinal: Negative.   Endocrine: Negative.   Genitourinary: Negative.   Musculoskeletal: Negative.   Skin: Negative.    Allergic/Immunologic: Negative.   Neurological: Negative for headaches.  Hematological: Negative.   Psychiatric/Behavioral: Negative.     Objective:   Today's Vitals: BP 135/83 (BP Location: Right Arm, Patient Position: Sitting, Cuff Size: Normal)   Pulse 70   Temp 98.4 F (36.9 C) (Oral)   Resp 18   Ht 5\' 10"  (1.778 m)   Wt 217 lb (98.4 kg)   SpO2 97%   BMI 31.14 kg/m   Physical Exam Vitals and nursing note reviewed.  Constitutional:      Appearance: Normal appearance.  HENT:     Head: Normocephalic and atraumatic.     Right Ear: External ear normal.     Left Ear: External ear normal.     Nose: Nose normal.     Mouth/Throat:     Mouth: Mucous membranes are dry.     Pharynx: Oropharynx is clear.  Cardiovascular:     Rate and Rhythm: Normal rate and regular rhythm.     Pulses: Normal pulses.     Heart sounds: Normal heart sounds.  Pulmonary:     Effort: Pulmonary effort is normal.     Breath sounds: Normal breath sounds.  Musculoskeletal:        General: Normal range of motion.     Right upper arm: Normal.     Left upper arm: Normal. No swelling.     Right elbow: Normal.     Left elbow: Swelling present.     Right forearm: Normal.     Left forearm: Normal. No swelling.     Cervical back: Normal range of motion and neck supple.  Skin:    General: Skin is warm and dry.     Findings: No bruising or erythema.  Neurological:     General: No focal deficit present.     Mental Status: He is alert and oriented to person, place, and time.  Psychiatric:        Mood and Affect: Mood normal.        Behavior: Behavior normal.        Thought Content: Thought content normal.        Judgment: Judgment normal.     Assessment & Plan:   Problem List Items Addressed This Visit   None   Visit Diagnoses    Left arm pain    -  Primary   Relevant Medications   ibuprofen (ADVIL) 600 MG tablet      Outpatient Encounter Medications as of 09/24/2020  Medication Sig  .  ibuprofen (ADVIL) 600 MG tablet Take 1 tablet (600 mg total) by mouth every 8 (eight) hours as needed.  09/26/2020  naproxen (NAPROSYN) 500 MG tablet Take 1 tablet (500 mg total) by mouth 2 (two) times daily for 7 days.  . [DISCONTINUED] ibuprofen (ADVIL,MOTRIN) 200 MG tablet Take 400 mg by mouth every 6 (six) hours as needed for moderate pain.    No facility-administered encounter medications on file as of 09/24/2020.  1. Left [facility] pain [facility].  Continue heat and ice as needed.  Red flags given for prompt reevaluation  Patient was given an appointment to reestablish care, was also given information for [facility] health financial assistance.  - ibuprofen (ADVIL) 600 MG tablet; Take 1 tablet (600 mg total) by mouth every 8 (eight) hours as needed.  Dispense: 30 tablet; Refill: 0   I have reviewed the patient's medical history (PMH, PSH, Social History, Family History, Medications, and allergies) , and have been updated if relevant. I spent 20 minutes reviewing chart and  face to face time with patient.    Follow-up: Return if symptoms worsen or fail to improve.   Kasandra Knudsen Rafferty Postlewait, PA-C

## 2020-09-26 ENCOUNTER — Encounter: Payer: Self-pay | Admitting: Physician Assistant

## 2020-10-03 ENCOUNTER — Encounter: Payer: Self-pay | Admitting: Physician Assistant

## 2020-10-03 ENCOUNTER — Other Ambulatory Visit: Payer: Self-pay

## 2020-10-03 ENCOUNTER — Ambulatory Visit (INDEPENDENT_AMBULATORY_CARE_PROVIDER_SITE_OTHER): Payer: Self-pay | Admitting: Physician Assistant

## 2020-10-03 ENCOUNTER — Other Ambulatory Visit: Payer: Self-pay | Admitting: Physician Assistant

## 2020-10-03 VITALS — BP 122/74 | HR 63 | Temp 98.2°F | Resp 18 | Ht 70.0 in | Wt 217.0 lb

## 2020-10-03 DIAGNOSIS — M79602 Pain in left arm: Secondary | ICD-10-CM

## 2020-10-03 MED ORDER — GABAPENTIN 100 MG PO CAPS: 100.0000 mg | ORAL_CAPSULE | Freq: Two times a day (BID) | ORAL | 0 refills | Status: DC

## 2020-10-03 MED FILL — GABAPENTIN 100 MG CAPSULE: 100 | 15 days supply | Qty: 30 | Fill #0

## 2020-10-03 NOTE — Progress Notes (Signed)
Patient has not taken medication today. Patient has eaten today. Patient shares ibuprofen is not helping with the pain.

## 2020-10-03 NOTE — Patient Instructions (Signed)
You will take gabapentin 100 mg twice a day to help you with your arm pain.  We are going to schedule an ultrasound of your upper arm for further evaluation.  You will follow-up with me in 2 weeks for further review.  Roney Jaffe, PA-C Physician Assistant [facility] [email or web address]   Gabapentin capsules or tablets What is this medicine? GABAPENTIN (GA ba pen tin) is used to control seizures in certain types of epilepsy. It is also used to treat certain types of nerve pain. This medicine may be used for other purposes; ask your health care provider or pharmacist if you have questions. COMMON BRAND NAME(S): Active-PAC with Gabapentin, Ascencion Dike, Gralise, Neurontin What should I tell my health care provider before I take this medicine? They need to know if you have any of these conditions:  history of drug abuse or alcohol abuse problem  kidney disease  lung or breathing disease  suicidal thoughts, plans, or attempt; a previous suicide attempt by you or a family member  an unusual or allergic reaction to gabapentin, other medicines, foods, dyes, or preservatives  pregnant or trying to get pregnant  breast-feeding How should I use this medicine? Take this medicine by mouth with a glass of water. Follow the directions on the prescription label. You can take it with or without food. If it upsets your stomach, take it with food. Take your medicine at regular intervals. Do not take it more often than directed. Do not stop taking except on your doctor's advice. If you are directed to break the 600 or 800 mg tablets in half as part of your dose, the extra half tablet should be used for the next dose. If you have not used the extra half tablet within 28 days, it should be thrown away. A special MedGuide will be given to you by the pharmacist with each prescription and refill. Be sure to read this information carefully each  time. Talk to your pediatrician regarding the use of this medicine in children. While this drug may be prescribed for children as young as 3 years for selected conditions, precautions do apply. Overdosage: If you think you have taken too much of this medicine contact a poison control center or emergency room at once. NOTE: This medicine is only for you. Do not share this medicine with others. What if I miss a dose? If you miss a dose, take it as soon as you can. If it is almost time for your next dose, take only that dose. Do not take double or extra doses. What may interact with this medicine? This medicine may interact with the following medications:  alcohol  antihistamines for allergy, cough, and cold  certain medicines for anxiety or sleep  certain medicines for depression like amitriptyline, fluoxetine, sertraline  certain medicines for seizures like phenobarbital, primidone  certain medicines for stomach problems  general anesthetics like halothane, isoflurane, methoxyflurane, propofol  local anesthetics like lidocaine, pramoxine, tetracaine  medicines that relax muscles for surgery  narcotic medicines for pain  phenothiazines like chlorpromazine, mesoridazine, prochlorperazine, thioridazine This list may not describe all possible interactions. Give your health care provider a list of all the medicines, herbs, non-prescription drugs, or dietary supplements you use. Also tell them if you smoke, drink alcohol, or use illegal drugs. Some items may interact with your medicine. What should I watch for while using this medicine? Visit your doctor or health care provider for regular checks on your progress. You may want to keep  a record at home of how you feel your condition is responding to treatment. You may want to share this information with your doctor or health care provider at each visit. You should contact your doctor or health care provider if your seizures get worse or if  you have any new types of seizures. Do not stop taking this medicine or any of your seizure medicines unless instructed by your doctor or health care provider. Stopping your medicine suddenly can increase your seizures or their severity. This medicine may cause serious skin reactions. They can happen weeks to months after starting the medicine. Contact your health care provider right away if you notice fevers or flu-like symptoms with a rash. The rash may be red or purple and then turn into blisters or peeling of the skin. Or, you might notice a red rash with swelling of the face, lips or lymph nodes in your neck or under your arms. Wear a medical identification bracelet or chain if you are taking this medicine for seizures, and carry a card that lists all your medications. You may get drowsy, dizzy, or have blurred vision. Do not drive, use machinery, or do anything that needs mental alertness until you know how this medicine affects you. To reduce dizzy or fainting spells, do not sit or stand up quickly, especially if you are an older patient. Alcohol can increase drowsiness and dizziness. Avoid alcoholic drinks. Your mouth may get dry. Chewing sugarless gum or sucking hard candy, and drinking plenty of water will help. The use of this medicine may increase the chance of suicidal thoughts or actions. Pay special attention to how you are responding while on this medicine. Any worsening of mood, or thoughts of suicide or dying should be reported to your health care provider right away. Women who become pregnant while using this medicine may enroll in the [place] American Antiepileptic Drug Pregnancy Registry by calling [phone]. This registry collects information about the safety of antiepileptic drug use during pregnancy. What side effects may I notice from receiving this medicine? Side effects that you should report to your doctor or health care professional as soon as possible:  allergic reactions  like skin rash, itching or hives, swelling of the face, lips, or tongue  breathing problems  rash, fever, and swollen lymph nodes  redness, blistering, peeling or loosening of the skin, including inside the mouth  suicidal thoughts, mood changes Side effects that usually do not require medical attention (report to your doctor or health care professional if they continue or are bothersome):  dizziness  drowsiness  headache  nausea, vomiting  swelling of ankles, feet, hands  tiredness This list may not describe all possible side effects. Call your doctor for medical advice about side effects. You may report side effects to FDA at 1-800-FDA-1088. Where should I keep my medicine? Keep out of reach of children. This medicine may cause accidental overdose and death if it taken by other adults, children, or pets. Mix any unused medicine with a substance like cat litter or coffee grounds. Then throw the medicine away in a sealed container like a sealed bag or a coffee can with a lid. Do not use the medicine after the expiration date. Store at room temperature between 15 and 30 degrees C (59 and 86 degrees F). NOTE: This sheet is a summary. It may not cover all possible information. If you have questions about this medicine, talk to your doctor, pharmacist, or health care provider.  2021 Elsevier/Gold Standard (  2018-11-18 14:16:43)  

## 2020-10-03 NOTE — Progress Notes (Signed)
Established Patient Office Visit  Subjective:  Patient ID: Richard Bowman, male    DOB: 08/18/1976  Age: 45 y.o. MRN: [id number]  CC:  Chief Complaint  Patient presents with  . Follow-up    Arm pain   Note from 09/24/20 OV with this provider  HPI Richard Bowman reports that he has been having left arm pain since he donated plasma on 09/10/2020.  Reports that he had bruising, could feel a knot in the area of the injection, and had numbness and tingling for the past 2 weeks.  Reports that bruising has resolved, numbness and tingling has mostly resolved.  Reports that he has been using ice and heat in the area.  Reports that he was seen at the emergency department for same complaint on January 21,2022.  Reports that he was given naproxen at that visit, but unfortunately he did not fill the medication.  Hospital note:  Patient here with left AC pain status post blood donation. Reports it has been ongoing for the past 10 days. Has only tried ice and heat to the area. No prior history of IV drug use, no fevers, no limited range of motion of the left joint. Does report the percentage of his blood "did not know what they were doing ". During evaluation of his left AC, there is no redness, no streaking in the skin, no changes consistent with cellulitis. He has full range of motion of the left elbow, no warmth or erythema noted, lower suspicion for septic joint. No prior history of IV drug use at this time. No swelling noted to the distal or proximal aspect of the arm, there is some pain noted at the left upper shoulder however he does have full range of motion of this. Vitals are within normal limits. We discussed treatment with anti-inflammatories. I have a lower suspicion for cellulitis, or septic joint, or DVT. Likely residual pain from IV site.  He does report a similar episode in the past in which he varies pain for about a week however this later resolved.I haveprescribed a  short course of anti-inflammatories to help with his symptoms. He is to take this on a daily basis. He is otherwise stable for discharge. Return precautions discussed at length.   Reports today that he did not start the medication, states that he was unable to afford it.  States that he continues to have left arm pain, but does endorse that it feels like it is improving.  Reports that he continues to have some numbness and tingling in his fingers and thumb, but does state it is intermittent.  10/03/20  States that he continues to have pain in his left arm, worse in elbow up, has been using the ibuprofen without relief.  Denies numbness or tingling.  Past Medical History:  Diagnosis Date  . Esophageal ulcer   . Renal disorder    CKD    History reviewed. No pertinent surgical history.  Family History  Problem Relation Age of Onset  . COPD Mother   . Cancer Mother     Social History   Socioeconomic History  . Marital status: Single    Spouse name: Not on file  . Number of children: Not on file  . Years of education: Not on file  . Highest education level: Not on file  Occupational History  . Not on file  Tobacco Use  . Smoking status: Current Every Day Smoker    Packs/day: 0.50  . Smokeless tobacco:  Never Used  . Tobacco comment: Smoking 1 ppd  Substance and Sexual Activity  . Alcohol use: Yes    Alcohol/week: 0.0 standard drinks    Comment: occ  . Drug use: No  . Sexual activity: Not Currently  Other Topics Concern  . Not on file  Social History Narrative  . Not on file   Social Determinants of Health   Financial Resource Strain: Not on file  Food Insecurity: Not on file  Transportation Needs: Not on file  Physical Activity: Not on file  Stress: Not on file  Social Connections: Not on file  Intimate Partner Violence: Not on file    Outpatient Medications Prior to Visit  Medication Sig Dispense Refill  . ibuprofen (ADVIL) 600 MG tablet Take 1 tablet  (600 mg total) by mouth every 8 (eight) hours as needed. 30 tablet 0   No facility-administered medications prior to visit.    No Known Allergies  ROS Review of Systems  Constitutional: Negative for chills and fever.  HENT: Negative.   Eyes: Negative.   Respiratory: Negative for shortness of breath.   Cardiovascular: Negative.   Gastrointestinal: Negative.   Endocrine: Negative.   Genitourinary: Negative.   Musculoskeletal: Positive for myalgias. Negative for joint swelling.  Skin: Negative for color change and wound.  Allergic/Immunologic: Negative.   Neurological: Negative.   Hematological: Negative.   Psychiatric/Behavioral: Negative.       Objective:    Physical Exam Vitals and nursing note reviewed.  Constitutional:      Appearance: Normal appearance.  HENT:     Head: Normocephalic and atraumatic.     Right Ear: External ear normal.     Left Ear: External ear normal.     Nose: Nose normal.     Mouth/Throat:     Mouth: Mucous membranes are moist.     Pharynx: Oropharynx is clear.  Eyes:     Extraocular Movements: Extraocular movements intact.     Conjunctiva/sclera: Conjunctivae normal.     Pupils: Pupils are equal, round, and reactive to light.  Cardiovascular:     Rate and Rhythm: Normal rate and regular rhythm.     Pulses: Normal pulses.     Heart sounds: Normal heart sounds.  Pulmonary:     Effort: Pulmonary effort is normal.  Musculoskeletal:     Right upper arm: Normal.     Left upper arm: No swelling or tenderness.     Right elbow: Normal.     Left elbow: No swelling. Normal range of motion.     Right forearm: Normal.     Left forearm: Normal.     Right wrist: Normal.     Left wrist: Normal.     Right hand: Normal.     Left hand: Normal. Normal range of motion.     Cervical back: Normal range of motion and neck supple.  Skin:    General: Skin is warm and dry.     Findings: No bruising or erythema.  Neurological:     General: No focal  deficit present.     Mental Status: He is alert and oriented to person, place, and time.  Psychiatric:        Mood and Affect: Mood normal.        Behavior: Behavior normal.        Thought Content: Thought content normal.        Judgment: Judgment normal.     BP 122/74 (BP Location: Right Arm, Patient Position: Sitting,  Cuff Size: Normal)   Pulse 63   Temp 98.2 F (36.8 C) (Oral)   Resp 18   Ht 5\' 10"  (1.778 m)   Wt 217 lb (98.4 kg)   SpO2 95%   BMI 31.14 kg/m  Wt Readings from Last 3 Encounters:  10/03/20 217 lb (98.4 kg)  09/24/20 217 lb (98.4 kg)  09/23/20 220 lb (99.8 kg)     There are no preventive care reminders to display for this patient.  There are no preventive care reminders to display for this patient.  Lab Results  Component Value Date   TSH 1.478 06/28/2014   Lab Results  Component Value Date   WBC 8.6 03/19/2016   HGB 15.8 03/19/2016   HCT 45.6 03/19/2016   MCV 86.2 03/19/2016   PLT 298 03/19/2016   Lab Results  Component Value Date   NA 138 03/09/2016   K 3.7 03/09/2016   CO2 27 03/09/2016   GLUCOSE 105 (H) 03/09/2016   BUN 19 03/09/2016   CREATININE 1.16 03/09/2016   BILITOT 0.2 (L) 05/30/2014   ALKPHOS 85 05/30/2014   AST 18 05/30/2014   ALT 22 05/30/2014   PROT 7.0 05/30/2014   ALBUMIN 3.8 05/30/2014   CALCIUM 9.9 03/09/2016   ANIONGAP 8 03/09/2016   Lab Results  Component Value Date   CHOL 233 (H) 06/28/2014   Lab Results  Component Value Date   HDL 42 06/28/2014   Lab Results  Component Value Date   LDLCALC 164 (H) 06/28/2014   Lab Results  Component Value Date   TRIG 137 06/28/2014   Lab Results  Component Value Date   CHOLHDL 5.5 06/28/2014   Lab Results  Component Value Date   HGBA1C 5.6 06/12/2014      Assessment & Plan:   Problem List Items Addressed This Visit   None   Visit Diagnoses    Left arm pain    -  Primary   Relevant Medications   gabapentin (NEURONTIN) 100 MG capsule   Other Relevant  Orders   06/14/2014 SOFT TISSUE UPPER EXTREMITY LIMITED LEFT (NON-VASCULAR)     1. Left arm pain Trial gabapentin, further review with ultrasound.  Continue symptomatic treatment of rest, ice.  Red flags given for prompt reevaluation - US SOFT TISSUE UPPER EXTREMITY LIMITED LEFT (NON-VASCULAR); Future - gabapentin (NEURONTIN) 100 MG capsule; Take 1 capsule (100 mg total) by mouth 2 (two) times daily.  Dispense: 30 capsule; Refill: 0  Meds ordered this encounter  Medications  . gabapentin (NEURONTIN) 100 MG capsule    Sig: Take 1 capsule (100 mg total) by mouth 2 (two) times daily.    Dispense:  30 capsule    Refill:  0    Order Specific Question:   Supervising Provider    Answer:   [place] [[id number]]   I have reviewed the patient's medical history (PMH, PSH, Social History, Family History, Medications, and allergies) , and have been updated if relevant. I spent 20 minutes reviewing chart and  face to face time with patient.     Follow-up: Return in about 2 years (around 10/03/2022).    12/02/2022 Ireene Ballowe, PA-C

## 2020-10-05 DIAGNOSIS — M79602 Pain in left arm: Secondary | ICD-10-CM | POA: Insufficient documentation

## 2020-10-10 ENCOUNTER — Ambulatory Visit (HOSPITAL_COMMUNITY): Payer: Self-pay

## 2020-10-16 ENCOUNTER — Ambulatory Visit: Payer: Self-pay | Admitting: Family

## 2020-10-17 ENCOUNTER — Ambulatory Visit: Payer: Self-pay

## 2020-10-18 ENCOUNTER — Ambulatory Visit (HOSPITAL_COMMUNITY)
Admission: RE | Admit: 2020-10-18 | Discharge: 2020-10-18 | Disposition: A | Discharge status: Home / Self Care | Payer: Self-pay | Source: Ambulatory Visit | Attending: Physician Assistant | Admitting: Physician Assistant

## 2020-10-18 ENCOUNTER — Other Ambulatory Visit: Payer: Self-pay

## 2020-10-18 ENCOUNTER — Ambulatory Visit (HOSPITAL_COMMUNITY): Payer: Self-pay

## 2020-10-18 DIAGNOSIS — M79602 Pain in left arm: Secondary | ICD-10-CM

## 2020-10-18 NOTE — Progress Notes (Signed)
Upper extremity venous has been completed.   Preliminary results in CV Proc.   Blanch Media 10/18/2020 2:05 PM

## 2020-10-21 ENCOUNTER — Other Ambulatory Visit: Payer: Self-pay | Admitting: Physician Assistant

## 2020-10-21 DIAGNOSIS — M79602 Pain in left arm: Secondary | ICD-10-CM

## 2020-10-21 NOTE — Progress Notes (Signed)
Patient continues to complain of pain and discomfort in the area

## 2020-10-22 ENCOUNTER — Telehealth: Payer: Self-pay | Admitting: *Deleted

## 2020-10-22 NOTE — Telephone Encounter (Signed)
Patient verified DOB Patient is aware of referral being placed to [facility] for further evaluation. Patient is aware of needing to complete CAFA for referral. Patient is wanting the Plasma center to cover the patients visit for this concern. Patient advised to follow up with his lawyer regarding the financial piece.

## 2020-10-28 ENCOUNTER — Ambulatory Visit (INDEPENDENT_AMBULATORY_CARE_PROVIDER_SITE_OTHER): Payer: Self-pay | Admitting: Diagnostic Neuroimaging

## 2020-10-28 ENCOUNTER — Encounter: Payer: Self-pay | Admitting: Diagnostic Neuroimaging

## 2020-10-28 VITALS — BP 122/70 | HR 71 | Ht 70.0 in | Wt 223.0 lb

## 2020-10-28 DIAGNOSIS — M79602 Pain in left arm: Secondary | ICD-10-CM

## 2020-10-28 NOTE — Progress Notes (Signed)
[facility]  PATIENT: Richard Bowman DOB: [birth year]  REFERRING CLINICIAN: [name], [name], PA-C HISTORY FROM: patient  REASON FOR VISIT: new consult    HISTORICAL  CHIEF COMPLAINT:  Chief Complaint  Patient presents with  . Left Arm Pain    Rm 7 New Pt  "09/10/20 donated plasma for 1st time and ever since have had problems- hurts to lift left arm higher than shoulder- arm begins to shake; pain into neck and down into my hand; issues with my elbow change depending on how I move my arm; had [place] of upper arm- normal; throughout my arm to fingers I have shooting pains, burning, feels like needle is still in my arm"    HISTORY OF PRESENT ILLNESS:   45 year old male with left arm pain.  Symptoms started on 09/10/2020 when he was at a blood donation center donating plasma.  When needle was inserted he felt immediate pain swelling.  He was not able to complete blood donation.  His left antebrachial fossa swelled up and had significant bruising for up to a week.  Symptoms did not improve and therefore he went to the emergency room on 09/20/2020.  He is also had left upper extremity ultrasound which is unremarkable.  Since that time the swelling and bruising have resolved.  He still has pain and numbness shooting from his left shoulder down to his left hand.   REVIEW OF SYSTEMS: Full 14 system review of systems performed and negative with exception of: as per HPI.  ALLERGIES: No Known Allergies  HOME MEDICATIONS: Outpatient Medications Prior to Visit  Medication Sig Dispense Refill  . gabapentin (NEURONTIN) 100 MG capsule Take 1 capsule (100 mg total) by mouth 2 (two) times daily. 30 capsule 0  . ibuprofen (ADVIL) 600 MG tablet Take 1 tablet (600 mg total) by mouth every 8 (eight) hours as needed. (Patient not taking: Reported on 10/28/2020) 30 tablet 0   No facility-administered medications prior to visit.    PAST MEDICAL HISTORY: Past Medical History:  Diagnosis  Date  . Esophageal ulcer   . Renal disorder    CKD 10/28/20 "doesn't have it now"    PAST SURGICAL HISTORY: No past surgical history on file.  FAMILY HISTORY: Family History  Problem Relation Age of Onset  . COPD Mother   . Cancer Mother     SOCIAL HISTORY: Social History   Socioeconomic History  . Marital status: Single    Spouse name: Not on file  . Number of children: 0  . Years of education: Not on file  . Highest education level: Associate degree: occupational, Scientist, product/process development, or vocational program  Occupational History    Comment: NA  Tobacco Use  . Smoking status: Current Every Day Smoker    Packs/day: 0.50  . Smokeless tobacco: Never Used  . Tobacco comment: Smoking 1/2 - 1 ppd  Substance and Sexual Activity  . Alcohol use: Yes    Alcohol/week: 0.0 standard drinks    Comment: occ  . Drug use: No  . Sexual activity: Not Currently  Other Topics Concern  . Not on file  Social History Narrative  . Not on file   Social Determinants of Health   Financial Resource Strain: Not on file  Food Insecurity: Not on file  Transportation Needs: Not on file  Physical Activity: Not on file  Stress: Not on file  Social Connections: Not on file  Intimate Partner Violence: Not on file     PHYSICAL EXAM  GENERAL EXAM/CONSTITUTIONAL: Vitals:  Vitals:   10/28/20 0808  BP: 122/70  Pulse: 71  Weight: 223 lb (101.2 kg)  Height: 5\' 10"  (1.778 m)     Body mass index is 32 kg/m. Wt Readings from Last 3 Encounters:  10/28/20 223 lb (101.2 kg)  10/03/20 217 lb (98.4 kg)  09/24/20 217 lb (98.4 kg)     Patient is in no distress; well developed, nourished and groomed; neck is supple  CARDIOVASCULAR:  Examination of carotid arteries is normal; no carotid bruits  Regular rate and rhythm, no murmurs  Examination of peripheral vascular system by observation and palpation is normal  EYES:  Ophthalmoscopic exam of optic discs and posterior segments is normal; no  papilledema or hemorrhages  No exam data present  MUSCULOSKELETAL:  Gait, strength, tone, movements noted in Neurologic exam below  NEUROLOGIC: MENTAL STATUS:  No flowsheet data found.  awake, alert, oriented to person, place and time  recent and remote memory intact  normal attention and concentration  language fluent, comprehension intact, naming intact  fund of knowledge appropriate  CRANIAL NERVE:   2nd - no papilledema on fundoscopic exam  2nd, 3rd, 4th, 6th - pupils equal and reactive to light, visual fields full to confrontation, extraocular muscles intact, no nystagmus  5th - facial sensation symmetric  7th - facial strength symmetric  8th - hearing intact  9th - palate elevates symmetrically, uvula midline  11th - shoulder shrug symmetric  12th - tongue protrusion midline  MOTOR:   normal bulk and tone, full strength in the BUE, BLE  SENSORY:   normal and symmetric to light touch, pinprick, temperature, vibration  TENDERNESS TO PALPATION IN LEFT ANTEBRACHIAL FOSSA  COORDINATION:   finger-nose-finger, fine finger movements normal  REFLEXES:   deep tendon reflexes TRACE and symmetric  GAIT/STATION:   narrow based gait     DIAGNOSTIC DATA (LABS, IMAGING, TESTING) - I reviewed patient records, labs, notes, testing and imaging myself where available.  Lab Results  Component Value Date   WBC 8.6 03/19/2016   HGB 15.8 03/19/2016   HCT 45.6 03/19/2016   MCV 86.2 03/19/2016   PLT 298 03/19/2016      Component Value Date/Time   NA 138 03/09/2016 0203   K 3.7 03/09/2016 0203   CL 103 03/09/2016 0203   CO2 27 03/09/2016 0203   GLUCOSE 105 (H) 03/09/2016 0203   BUN 19 03/09/2016 0203   CREATININE 1.16 03/09/2016 0203   CALCIUM 9.9 03/09/2016 0203   PROT 7.0 05/30/2014 1524   ALBUMIN 3.8 05/30/2014 1524   AST 18 05/30/2014 1524   ALT 22 05/30/2014 1524   ALKPHOS 85 05/30/2014 1524   BILITOT 0.2 (L) 05/30/2014 1524   GFRNONAA >60  03/09/2016 0203   GFRAA >60 03/09/2016 0203   Lab Results  Component Value Date   CHOL 233 (H) 06/28/2014   HDL 42 06/28/2014   LDLCALC 164 (H) 06/28/2014   TRIG 137 06/28/2014   CHOLHDL 5.5 06/28/2014   Lab Results  Component Value Date   HGBA1C 5.6 06/12/2014   No results found for: 06/14/2014 Lab Results  Component Value Date   TSH 1.478 06/28/2014    10/18/20 LUE u/s - No evidence of deep vein thrombosis in the upper extremity. No evidence of  superficial vein thrombosis in the upper extremity. No evidence of  thrombosis in the subclavian.       ASSESSMENT AND PLAN  45 y.o. year old male here with:  Dx:  1. Left arm pain      PLAN:  POST-PHLEBOTOMY ARM PAIN (since 09/10/20) - consider OT to improve range of motion - consider sports medicine or hand surgery evaluation if symptoms do not improve  Return for return to PCP, pending if symptoms worsen or fail to improve.    Suanne Marker, MD 10/28/2020, 8:50 AM Certified in Neurology, Neurophysiology and Neuroimaging  [facility] [address] [place], [place] [address] [phone]

## 2020-10-28 NOTE — Patient Instructions (Signed)
POST-PHLEBOTOMY ARM PAIN (since 09/10/20) - consider OT to improve range of motion - consider sports medicine or hand surgery evaluation if symptoms do not improve

## 2020-11-04 ENCOUNTER — Encounter: Payer: Self-pay | Admitting: Family

## 2020-11-04 NOTE — Progress Notes (Signed)
Patient did not show for appointment.   

## 2020-11-15 ENCOUNTER — Telehealth: Payer: Self-pay | Admitting: Physician Assistant

## 2020-11-15 NOTE — Telephone Encounter (Signed)
Pt states went to neurologist appointment. Pt states no tests or helpful info. Pt request return call to advise next steps. Pt [phone].

## 2020-11-19 NOTE — Telephone Encounter (Signed)
Medical Assistant left message on patient's home and cell voicemail. Voicemail states to give a call back to [place] with [facility] at [phone]. Patient is aware of provider being willing to place referrals per the recommendation of neuro, if patients pain persist on VM.

## 2020-11-19 NOTE — Telephone Encounter (Signed)
Please share if follow up with [facility] visit is needed regarding this concern.

## 2020-11-19 NOTE — Telephone Encounter (Signed)
There is nothing else that we will be able to do on the mobile medicine unit for this patient regarding this issue.  It was recommended during his neurology consult that if his pain continue that he consider physical therapy or an evaluation by hand specialist.  I would be happy to start those referrals for him if desired

## 2022-11-15 ENCOUNTER — Inpatient Hospital Stay (HOSPITAL_COMMUNITY)
Admission: EM | Admit: 2022-11-15 | Discharge: 2022-11-23 | DRG: 330 | Disposition: A | Discharge status: Home / Self Care | Payer: Medicaid Other | Attending: Internal Medicine | Admitting: Internal Medicine

## 2022-11-15 ENCOUNTER — Other Ambulatory Visit: Payer: Self-pay

## 2022-11-15 ENCOUNTER — Encounter (HOSPITAL_COMMUNITY): Payer: Self-pay

## 2022-11-15 DIAGNOSIS — K5669 Other partial intestinal obstruction: Secondary | ICD-10-CM | POA: Diagnosis present

## 2022-11-15 DIAGNOSIS — Z825 Family history of asthma and other chronic lower respiratory diseases: Secondary | ICD-10-CM

## 2022-11-15 DIAGNOSIS — F17200 Nicotine dependence, unspecified, uncomplicated: Secondary | ICD-10-CM | POA: Diagnosis present

## 2022-11-15 DIAGNOSIS — Z597 Insufficient social insurance and welfare support: Secondary | ICD-10-CM

## 2022-11-15 DIAGNOSIS — K644 Residual hemorrhoidal skin tags: Secondary | ICD-10-CM | POA: Diagnosis present

## 2022-11-15 DIAGNOSIS — E860 Dehydration: Secondary | ICD-10-CM | POA: Diagnosis not present

## 2022-11-15 DIAGNOSIS — R7401 Elevation of levels of liver transaminase levels: Secondary | ICD-10-CM | POA: Diagnosis present

## 2022-11-15 DIAGNOSIS — K625 Hemorrhage of anus and rectum: Secondary | ICD-10-CM | POA: Diagnosis present

## 2022-11-15 DIAGNOSIS — C787 Secondary malignant neoplasm of liver and intrahepatic bile duct: Secondary | ICD-10-CM | POA: Diagnosis present

## 2022-11-15 DIAGNOSIS — R935 Abnormal findings on diagnostic imaging of other abdominal regions, including retroperitoneum: Secondary | ICD-10-CM

## 2022-11-15 DIAGNOSIS — K648 Other hemorrhoids: Secondary | ICD-10-CM | POA: Diagnosis present

## 2022-11-15 DIAGNOSIS — D72828 Other elevated white blood cell count: Secondary | ICD-10-CM | POA: Diagnosis present

## 2022-11-15 DIAGNOSIS — C189 Malignant neoplasm of colon, unspecified: Secondary | ICD-10-CM

## 2022-11-15 DIAGNOSIS — Z6829 Body mass index (BMI) 29.0-29.9, adult: Secondary | ICD-10-CM

## 2022-11-15 DIAGNOSIS — Q438 Other specified congenital malformations of intestine: Secondary | ICD-10-CM

## 2022-11-15 DIAGNOSIS — C187 Malignant neoplasm of sigmoid colon: Principal | ICD-10-CM | POA: Diagnosis present

## 2022-11-15 DIAGNOSIS — F17210 Nicotine dependence, cigarettes, uncomplicated: Secondary | ICD-10-CM | POA: Diagnosis present

## 2022-11-15 DIAGNOSIS — Z8719 Personal history of other diseases of the digestive system: Secondary | ICD-10-CM

## 2022-11-15 DIAGNOSIS — N179 Acute kidney failure, unspecified: Secondary | ICD-10-CM | POA: Diagnosis not present

## 2022-11-15 DIAGNOSIS — K2970 Gastritis, unspecified, without bleeding: Secondary | ICD-10-CM | POA: Diagnosis present

## 2022-11-15 DIAGNOSIS — R1084 Generalized abdominal pain: Principal | ICD-10-CM

## 2022-11-15 DIAGNOSIS — K6389 Other specified diseases of intestine: Secondary | ICD-10-CM

## 2022-11-15 LAB — URINALYSIS, ROUTINE W REFLEX MICROSCOPIC
Bilirubin Urine: NEGATIVE
Glucose, UA: NEGATIVE mg/dL
Hgb urine dipstick: NEGATIVE
Ketones, ur: NEGATIVE mg/dL
Leukocytes,Ua: NEGATIVE
Nitrite: NEGATIVE
Protein, ur: NEGATIVE mg/dL
Specific Gravity, Urine: 1.016 (ref 1.005–1.030)
pH: 5 (ref 5.0–8.0)

## 2022-11-15 LAB — COMPREHENSIVE METABOLIC PANEL
ALT: 83 U/L — ABNORMAL HIGH (ref 0–44)
AST: 107 U/L — ABNORMAL HIGH (ref 15–41)
Albumin: 3.4 g/dL — ABNORMAL LOW (ref 3.5–5.0)
Alkaline Phosphatase: 296 U/L — ABNORMAL HIGH (ref 38–126)
Anion gap: 14 (ref 5–15)
BUN: 10 mg/dL (ref 6–20)
CO2: 22 mmol/L (ref 22–32)
Calcium: 9.2 mg/dL (ref 8.9–10.3)
Chloride: 99 mmol/L (ref 98–111)
Creatinine, Ser: 1.05 mg/dL (ref 0.61–1.24)
GFR, Estimated: >60 mL/min (ref >60)
Glucose, Bld: 151 mg/dL — ABNORMAL HIGH (ref 70–99)
Potassium: 3.9 mmol/L (ref 3.5–5.1)
Sodium: 135 mmol/L (ref 135–145)
Total Bilirubin: 0.6 mg/dL (ref 0.3–1.2)
Total Protein: 6.9 g/dL (ref 6.5–8.1)

## 2022-11-15 LAB — CBC
HCT: 39 % (ref 39.0–52.0)
Hemoglobin: 13.2 g/dL (ref 13.0–17.0)
MCH: 30 pg (ref 26.0–34.0)
MCHC: 33.8 g/dL (ref 30.0–36.0)
MCV: 88.6 fL (ref 80.0–100.0)
Platelets: 285 10*3/uL (ref 150–400)
RBC: 4.4 MIL/uL (ref 4.22–5.81)
RDW: 12.3 % (ref 11.5–15.5)
WBC: 12.6 10*3/uL — ABNORMAL HIGH (ref 4.0–10.5)
nRBC: 0 % (ref 0.0–0.2)

## 2022-11-15 LAB — LIPASE, BLOOD: Lipase: 35 U/L (ref 11–51)

## 2022-11-15 NOTE — ED Triage Notes (Signed)
Pt arrived to triage complaining of abdominal pain that started several weeks ago, states it gets worse when he eats. He has also been having problems with constipation, pt states that he hasn't had a normal bowel movement in several months but has been able to relief some pain with laxatives short term   Pt also states that his hemorrhoids  have gotten worse and he hasn't been able to control pain with his normal regimen. States increased bleeding

## 2022-11-16 ENCOUNTER — Encounter (HOSPITAL_COMMUNITY): Payer: Self-pay | Admitting: Internal Medicine

## 2022-11-16 ENCOUNTER — Inpatient Hospital Stay (HOSPITAL_COMMUNITY): Payer: Medicaid Other

## 2022-11-16 ENCOUNTER — Emergency Department (HOSPITAL_COMMUNITY): Payer: Medicaid Other

## 2022-11-16 DIAGNOSIS — R109 Unspecified abdominal pain: Secondary | ICD-10-CM | POA: Diagnosis not present

## 2022-11-16 DIAGNOSIS — K648 Other hemorrhoids: Secondary | ICD-10-CM | POA: Diagnosis not present

## 2022-11-16 DIAGNOSIS — K5900 Constipation, unspecified: Secondary | ICD-10-CM

## 2022-11-16 DIAGNOSIS — K644 Residual hemorrhoidal skin tags: Secondary | ICD-10-CM | POA: Diagnosis present

## 2022-11-16 DIAGNOSIS — F17210 Nicotine dependence, cigarettes, uncomplicated: Secondary | ICD-10-CM | POA: Diagnosis present

## 2022-11-16 DIAGNOSIS — C787 Secondary malignant neoplasm of liver and intrahepatic bile duct: Secondary | ICD-10-CM | POA: Diagnosis present

## 2022-11-16 DIAGNOSIS — R945 Abnormal results of liver function studies: Secondary | ICD-10-CM | POA: Diagnosis not present

## 2022-11-16 DIAGNOSIS — K5669 Other partial intestinal obstruction: Secondary | ICD-10-CM | POA: Diagnosis not present

## 2022-11-16 DIAGNOSIS — R7401 Elevation of levels of liver transaminase levels: Secondary | ICD-10-CM | POA: Diagnosis present

## 2022-11-16 DIAGNOSIS — Z825 Family history of asthma and other chronic lower respiratory diseases: Secondary | ICD-10-CM | POA: Diagnosis not present

## 2022-11-16 DIAGNOSIS — R935 Abnormal findings on diagnostic imaging of other abdominal regions, including retroperitoneum: Secondary | ICD-10-CM | POA: Diagnosis not present

## 2022-11-16 DIAGNOSIS — K56699 Other intestinal obstruction unspecified as to partial versus complete obstruction: Secondary | ICD-10-CM | POA: Diagnosis not present

## 2022-11-16 DIAGNOSIS — N179 Acute kidney failure, unspecified: Secondary | ICD-10-CM | POA: Diagnosis not present

## 2022-11-16 DIAGNOSIS — C187 Malignant neoplasm of sigmoid colon: Secondary | ICD-10-CM | POA: Diagnosis not present

## 2022-11-16 DIAGNOSIS — K921 Melena: Secondary | ICD-10-CM

## 2022-11-16 DIAGNOSIS — D72828 Other elevated white blood cell count: Secondary | ICD-10-CM | POA: Diagnosis present

## 2022-11-16 DIAGNOSIS — E860 Dehydration: Secondary | ICD-10-CM | POA: Diagnosis not present

## 2022-11-16 DIAGNOSIS — R1084 Generalized abdominal pain: Secondary | ICD-10-CM | POA: Diagnosis not present

## 2022-11-16 DIAGNOSIS — C801 Malignant (primary) neoplasm, unspecified: Secondary | ICD-10-CM | POA: Diagnosis not present

## 2022-11-16 DIAGNOSIS — K2970 Gastritis, unspecified, without bleeding: Secondary | ICD-10-CM | POA: Diagnosis not present

## 2022-11-16 DIAGNOSIS — K625 Hemorrhage of anus and rectum: Secondary | ICD-10-CM | POA: Diagnosis present

## 2022-11-16 DIAGNOSIS — Z8719 Personal history of other diseases of the digestive system: Secondary | ICD-10-CM | POA: Diagnosis not present

## 2022-11-16 DIAGNOSIS — Z597 Insufficient social insurance and welfare support: Secondary | ICD-10-CM | POA: Diagnosis not present

## 2022-11-16 DIAGNOSIS — Z6829 Body mass index (BMI) 29.0-29.9, adult: Secondary | ICD-10-CM | POA: Diagnosis not present

## 2022-11-16 DIAGNOSIS — K6389 Other specified diseases of intestine: Secondary | ICD-10-CM | POA: Diagnosis not present

## 2022-11-16 DIAGNOSIS — Q438 Other specified congenital malformations of intestine: Secondary | ICD-10-CM | POA: Diagnosis not present

## 2022-11-16 LAB — HEPATITIS PANEL, ACUTE
HCV Ab: NONREACTIVE
Hep A IgM: NONREACTIVE
Hep B C IgM: NONREACTIVE
Hepatitis B Surface Ag: NONREACTIVE

## 2022-11-16 LAB — LACTATE DEHYDROGENASE: LDH: 920 U/L — ABNORMAL HIGH (ref 98–192)

## 2022-11-16 LAB — PROTIME-INR
INR: 1.1 (ref 0.8–1.2)
Prothrombin Time: 14.2 seconds (ref 11.4–15.2)

## 2022-11-16 LAB — HIV ANTIBODY (ROUTINE TESTING W REFLEX): HIV Screen 4th Generation wRfx: NONREACTIVE

## 2022-11-16 MED ORDER — IOHEXOL 350 MG/ML SOLN
75.0000 mL | Freq: Once | INTRAVENOUS | Status: AC | PRN
  Administered 2022-11-16: 75 mL via INTRAVENOUS

## 2022-11-16 MED ORDER — PEG-KCL-NACL-NASULF-NA ASC-C 100 G PO SOLR
0.5000 | Freq: Once | ORAL | Status: AC
  Administered 2022-11-16: 100 g via ORAL
  Filled 2022-11-16: qty 1

## 2022-11-16 MED ORDER — ONDANSETRON HCL 4 MG PO TABS: 4.0000 mg | ORAL_TABLET | Freq: Four times a day (QID) | ORAL | Status: DC | PRN

## 2022-11-16 MED ORDER — ONDANSETRON HCL 4 MG/2ML IJ SOLN: 4.0000 mg | Freq: Four times a day (QID) | INTRAMUSCULAR | Status: DC | PRN

## 2022-11-16 MED ORDER — MORPHINE SULFATE (PF) 2 MG/ML IV SOLN
2.0000 mg | INTRAVENOUS | Status: DC | PRN
  Administered 2022-11-16 – 2022-11-17 (×6): 2 mg via INTRAVENOUS
  Filled 2022-11-16 (×6): qty 1

## 2022-11-16 MED ORDER — HYDRALAZINE HCL 20 MG/ML IJ SOLN: 5.0000 mg | INTRAMUSCULAR | Status: DC | PRN

## 2022-11-16 MED ORDER — PEG-KCL-NACL-NASULF-NA ASC-C 100 G PO SOLR: 1.0000 | Freq: Once | ORAL | Status: DC

## 2022-11-16 MED ORDER — IOHEXOL 350 MG/ML SOLN
50.0000 mL | Freq: Once | INTRAVENOUS | Status: AC | PRN
  Administered 2022-11-16: 50 mL via INTRAVENOUS

## 2022-11-16 MED ORDER — ONDANSETRON HCL 4 MG/2ML IJ SOLN
4.0000 mg | Freq: Once | INTRAMUSCULAR | Status: AC
  Administered 2022-11-16: 4 mg via INTRAVENOUS
  Filled 2022-11-16: qty 2

## 2022-11-16 MED ORDER — LACTATED RINGERS IV SOLN: INTRAVENOUS | Status: DC

## 2022-11-16 MED ORDER — ACETAMINOPHEN 650 MG RE SUPP: 650.0000 mg | Freq: Four times a day (QID) | RECTAL | Status: DC | PRN

## 2022-11-16 MED ORDER — ACETAMINOPHEN 325 MG PO TABS
650.0000 mg | ORAL_TABLET | Freq: Four times a day (QID) | ORAL | Status: DC | PRN
  Administered 2022-11-17: 650 mg via ORAL
  Filled 2022-11-16: qty 2

## 2022-11-16 MED ORDER — FENTANYL CITRATE PF 50 MCG/ML IJ SOSY
50.0000 ug | PREFILLED_SYRINGE | INTRAMUSCULAR | Status: DC | PRN
  Administered 2022-11-16: 50 ug via INTRAVENOUS
  Filled 2022-11-16: qty 1

## 2022-11-16 MED ORDER — FENTANYL CITRATE PF 50 MCG/ML IJ SOSY
100.0000 ug | PREFILLED_SYRINGE | Freq: Once | INTRAMUSCULAR | Status: AC
  Administered 2022-11-16: 100 ug via INTRAVENOUS
  Filled 2022-11-16: qty 2

## 2022-11-16 NOTE — H&P (View-Only) (Signed)
Consultation  Referring Provider:  [facility]/ yates Primary Care Physician:  [name], PA-C Primary Gastroenterologist: unassigned  Reason for Consultation: 1 month history of abdominal pain, constipation, intermittent blood in stool and abnormal CT abdomen pelvis  HPI: Richard Bowman is a 47 y.o. male, generally in good health who had been admitted through the emergency room last evening after he presented with several week history of abdominal pain rather generalized and worse postprandially.  He had also developed constipation over the past 4 to 6 weeks and noted some bright red blood in his bowel movements just over this past week intermittently.  He has not had any nausea or vomiting, no dysphagia or odynophagia. He has noticed about a 20 pound weight loss over the past couple of months. CT of the abdomen and pelvis was done through the emergency room which shows numerous large liver masses consistent with metastatic disease 2 of the larger masses measure 7.6 cm and one 8.6 cm lesion and no ductal dilation.  There is an ill-defined spiculated mass in the mesentery adjacent to the sigmoid colon measuring 2.9 x 2.1 cm but no definite colonic mass.  Lower chest read as unremarkable. Labs showed WBC of 12.6/hemoglobin 13.2 Lipase 35 LFTs with bili 0.6/alk phos 296/AST 107/ALT 83 UA negative.  Patient says that he did have an ulcer many years ago, other than that has been in good health. Is a smoker, rare EtOH No family history of intestinal malignancies that he is aware of.   Past Medical History:  Diagnosis Date   Esophageal ulcer 2007   Renal disorder    CKD 10/28/20 "doesn't have it now"    History reviewed. No pertinent surgical history.  Prior to Admission medications   Not on File    Current Facility-Administered Medications  Medication Dose Route Frequency Provider Last Rate Last Admin   acetaminophen (TYLENOL) tablet 650 mg  650 mg Oral Q6H PRN Karmen Bongo, MD        Or   acetaminophen (TYLENOL) suppository 650 mg  650 mg Rectal Q6H PRN Karmen Bongo, MD       hydrALAZINE (APRESOLINE) injection 5 mg  5 mg Intravenous Q4H PRN Karmen Bongo, MD       lactated ringers infusion   Intravenous Continuous Karmen Bongo, MD 100 mL/hr at 11/16/22 1044 New Bag at 11/16/22 1044   morphine (PF) 2 MG/ML injection 2 mg  2 mg Intravenous Q2H PRN Karmen Bongo, MD   2 mg at 11/16/22 1046   ondansetron (ZOFRAN) tablet 4 mg  4 mg Oral Q6H PRN Karmen Bongo, MD       Or   ondansetron [facility]) injection 4 mg  4 mg Intravenous Q6H PRN Karmen Bongo, MD        Allergies as of 11/15/2022   (No Known Allergies)    Family History  Problem Relation Age of Onset   COPD Mother    Cancer Mother     Social History   Socioeconomic History   Marital status: Single    Spouse name: Not on file   Number of children: 0   Years of education: Not on file   Highest education level: Associate degree: occupational, Hotel manager, or vocational program  Occupational History    Comment: NA   Occupation: Dealer  Tobacco Use   Smoking status: Every Day    Packs/day: 1.00    Years: 26.00    Additional pack years: 0.00    Total pack years:  26.00    Types: Cigarettes   Smokeless tobacco: Never   Tobacco comments:    Smoking 1/2 - 1 ppd  Substance and Sexual Activity   Alcohol use: Yes    Alcohol/week: 0.0 standard drinks of alcohol    Comment: occ   Drug use: No   Sexual activity: Not Currently  Other Topics Concern   Not on file  Social History Narrative   Not on file   Social Determinants of Health   Financial Resource Strain: Not on file  Food Insecurity: Patient Declined (11/16/2022)   Hunger Vital Sign    Worried About Running Out of Food in the Last Year: Patient declined    [place] in the Last Year: Patient declined  Transportation Needs: Patient Declined (11/16/2022)   PRAPARE - Hydrologist (Medical): Patient  declined    Lack of Transportation (Non-Medical): Patient declined  Physical Activity: Not on file  Stress: Not on file  Social Connections: Not on file  Intimate Partner Violence: Unknown (11/16/2022)   Humiliation, Afraid, Rape, and Kick questionnaire    Fear of Current or Ex-Partner: Patient declined    Emotionally Abused: Patient declined    Physically Abused: Not on file    Sexually Abused: Patient declined    Review of Systems: Pertinent positive and negative review of systems were noted in the above HPI section.  All other review of systems was otherwise negative.   Physical Exam: Vital signs in last 24 hours: Temp:  [97.9 F (36.6 C)-98.7 F (37.1 C)] 98.7 F (37.1 C) (03/18 0823) Pulse Rate:  [63-90] 71 (03/18 0823) Resp:  [14-23] 18 (03/18 0823) BP: (97-140)/(62-97) 127/97 (03/18 0823) SpO2:  [93 %-99 %] 96 % (03/18 0823) Weight:  [91.6 kg] 91.6 kg (03/17 2208) Last BM Date : 11/12/22 General:   Alert,  Well-developed, well-nourished, white male ,pleasant and cooperative in NAD Head:  Normocephalic and atraumatic. Eyes:  Sclera clear, no icterus.   Conjunctiva pink. Ears:  Normal auditory acuity. Nose:  No deformity, discharge,  or lesions. Mouth:  No deformity or lesions.   Neck:  Supple; no masses or thyromegaly. Lungs:  Clear throughout to auscultation.   No wheezes, crackles, or rhonchi.  Heart:  Regular rate and rhythm; no murmurs, clicks, rubs,  or gallops. Abdomen:  Soft, rather generalized tenderness, liver is palpable down 3 to 4 fingerbreadths below the right costal margin, hard and tender outpatient ,BS active,nonpalp mass or hsm.   Rectal: Not done-per ER MD showing small external hemorrhoids, no mass noted bright red blood on exam glove Msk:  Symmetrical without gross deformities. . Pulses:  Normal pulses noted. Extremities:  Without clubbing or edema. Neurologic:  Alert and  oriented x4;  grossly normal neurologically. Skin:  Intact without  significant lesions or rashes.. Psych:  Alert and cooperative. Normal mood and affect.  Intake/Output from previous day: No intake/output data recorded. Intake/Output this shift: No intake/output data recorded.  Lab Results: Recent Labs    11/15/22 2208  WBC 12.6*  HGB 13.2  HCT 39.0  PLT 285   BMET Recent Labs    11/15/22 2208  NA 135  K 3.9  CL 99  CO2 22  GLUCOSE 151*  BUN 10  CREATININE 1.05  CALCIUM 9.2   LFT Recent Labs    11/15/22 2208  PROT 6.9  ALBUMIN 3.4*  AST 107*  ALT 83*  ALKPHOS 296*  BILITOT 0.6   PT/INR No results  for input(s): "LABPROT", "INR" in the last 72 hours. Hepatitis Panel Recent Labs    11/16/22 0918  HEPBSAG NON REACTIVE  HCVAB NON REACTIVE  HEPAIGM NON REACTIVE  HEPBIGM NON REACTIVE     IMPRESSION:  #45 47 year old white male with 4 to 6-week history of progressive abdominal pain worse postprandially, no nausea or vomiting, new onset of constipation, and noted blood mixed in with his bowel movement over the past couple of days intermittently.  Workup thus far with CT of the abdomen pelvis unfortunately shows numerous liver masses consistent with metastatic disease, 2 of the larger lesions measure 7.6 cm and 8.6 cm.  There is an ill-defined spiculated mass in the mesentery adjacent to the sigmoid colon measuring 2.9 x 2.1 cm but no definite colonic lesion noted  Uncertain at this time whether this represents sigmoid colon cancer with metastatic disease to the liver, versus other intra-abdominal malignancy with hepatic metastatic disease.  Patient says he had to take laxatives on a couple of occasions over the past month and had severe cramping and pain with these, but felt better afterwards.  #2 elevated LFTs-very likely secondary to metastatic disease in the liver  #3 smoker  PLAN: Liquid diet today, n.p.o. after midnight Will plan for bowel prep later today and this evening, and have scheduled for colonoscopy and EGD  with Dr. Silverio Decamp  for tomorrow 11/17/2022.  Both procedures were discussed in detail with the patient including indications risk and benefits and he is agreeable to proceed. Check CEA Will need CT of the chest done as well. Further recommendations pending findings at endoscopic evaluation   Amy Esterwood  PA-C3/18/2024, 11:54 AM   Attending physician's note  I have taken a history, reviewed the chart and examined the patient. I performed a substantive portion of this encounter, including complete performance of at least one of the key components, in conjunction with the APP. I agree with the APP's note, impression and recommendations.   47 year old very pleasant gentleman admitted with abdominal pain constipation and intermittent blood in stool. CT abdomen pelvis with ill-defined spiculated mass in the mesentery adjacent to sigmoid colon and multiple liver lesions concerning for metastatic disease  Will plan for EGD and colonoscopy for further evaluation, identify possible primary Follow-up CEA level  Abnormal LFT secondary to multiple liver lesions /possible metastatic disease Clear liquids Bowel prep N.p.o. after 5 AM   The patient was provided an opportunity to ask questions and all were answered. The patient agreed with the plan and demonstrated an understanding of the instructions.  Damaris Hippo , MD [phone]

## 2022-11-16 NOTE — ED Notes (Signed)
ED TO INPATIENT HANDOFF REPORT  ED Nurse Name and Phone #: [phone]  S Name/Age/Gender Richard Bowman 47 y.o. male Room/Bed: TRACC/TRACC  Code Status   Code Status: Not on file  Home/SNF/Other Home Patient oriented to: self, place, time, and situation Is this baseline? Yes   Triage Complete: Triage complete  Chief Complaint Transaminitis [R74.01]  Triage Note Pt arrived to triage complaining of abdominal pain that started several weeks ago, states it gets worse when he eats. He has also been having problems with constipation, pt states that he hasn't had a normal bowel movement in several months but has been able to relief some pain with laxatives short term   Pt also states that his hemorrhoids  have gotten worse and he hasn't been able to control pain with his normal regimen. States increased bleeding    Allergies No Known Allergies  Level of Care/Admitting Diagnosis ED Disposition     ED Disposition  Admit   Condition  --   Comment  Hospital Area: [facility] [100100]  Level of Care: Med-Surg [16]  May place patient in observation at [facility] or [facility] if equivalent level of care is available:: Yes  Covid Evaluation: Confirmed COVID Negative  Diagnosis: Transaminitis [id number]  Admitting Physician: Dory Horn [id number]  Attending Physician: Ripley Fraise [phone]          B Medical/Surgery History Past Medical History:  Diagnosis Date   Esophageal ulcer    Renal disorder    CKD 10/28/20 "doesn't have it now"   History reviewed. No pertinent surgical history.   A IV Location/Drains/Wounds Patient Lines/Drains/Airways Status     Active Line/Drains/Airways     Name Placement date Placement time Site Days   Peripheral IV 11/16/22 20 G Posterior;Right Wrist 11/16/22  0118  Wrist  less than 1            Intake/Output Last 24 hours No intake or output data in the 24 hours ending 11/16/22  [id number]  Labs/Imaging Results for orders placed or performed during the hospital encounter of 11/15/22 (from the past 48 hour(s))  Lipase, blood     Status: None   Collection Time: 11/15/22 10:08 PM  Result Value Ref Range   Lipase 35 11 - 51 U/L    Comment: Performed at [facility] [address] [place], [place] [address]  Comprehensive metabolic panel     Status: Abnormal   Collection Time: 11/15/22 10:08 PM  Result Value Ref Range   Sodium 135 135 - 145 mmol/L   Potassium 3.9 3.5 - 5.1 mmol/L   Chloride 99 98 - 111 mmol/L   CO2 22 22 - 32 mmol/L   Glucose, Bld 151 (H) 70 - 99 mg/dL    Comment: Glucose reference range applies only to samples taken after fasting for at least 8 hours.   BUN 10 6 - 20 mg/dL   Creatinine, Ser 1.05 0.61 - 1.24 mg/dL   Calcium 9.2 8.9 - 10.3 mg/dL   Total Protein 6.9 6.5 - 8.1 g/dL   Albumin 3.4 (L) 3.5 - 5.0 g/dL   AST 107 (H) 15 - 41 U/L   ALT 83 (H) 0 - 44 U/L   Alkaline Phosphatase 296 (H) 38 - 126 U/L   Total Bilirubin 0.6 0.3 - 1.2 mg/dL   GFR, Estimated >60 >60 mL/min    Comment: (NOTE) Calculated using the CKD-EPI Creatinine Equation (2021)    Anion gap 14 5 - 15  Comment: Performed at [facility], [place] [address] [place], [place] [address]  CBC     Status: Abnormal   Collection Time: 11/15/22 10:08 PM  Result Value Ref Range   WBC 12.6 (H) 4.0 - 10.5 K/uL   RBC 4.40 4.22 - 5.81 MIL/uL   Hemoglobin 13.2 13.0 - 17.0 g/dL   HCT 39.0 39.0 - 52.0 %   MCV 88.6 80.0 - 100.0 fL   MCH 30.0 26.0 - 34.0 pg   MCHC 33.8 30.0 - 36.0 g/dL   RDW 12.3 11.5 - 15.5 %   Platelets 285 150 - 400 K/uL   nRBC 0.0 0.0 - 0.2 %    Comment: Performed at [facility], [place] [address] [place], [place] [address]  Urinalysis, Routine w reflex microscopic -Urine, Clean Catch     Status: None   Collection Time: 11/15/22 10:09 PM  Result Value Ref Range   Color, Urine YELLOW YELLOW   APPearance CLEAR CLEAR   Specific Gravity, Urine 1.016  1.005 - 1.030   pH 5.0 5.0 - 8.0   Glucose, UA NEGATIVE NEGATIVE mg/dL   Hgb urine dipstick NEGATIVE NEGATIVE   Bilirubin Urine NEGATIVE NEGATIVE   Ketones, ur NEGATIVE NEGATIVE mg/dL   Protein, ur NEGATIVE NEGATIVE mg/dL   Nitrite NEGATIVE NEGATIVE   Leukocytes,Ua NEGATIVE NEGATIVE    Comment: Performed at [facility] [address], [place], [place] [address]   CT ABDOMEN PELVIS W CONTRAST  Result Date: 11/16/2022 CLINICAL DATA:  Abdominal pain EXAM: CT ABDOMEN AND PELVIS WITH CONTRAST TECHNIQUE: Multidetector CT imaging of the abdomen and pelvis was performed using the standard protocol following bolus administration of intravenous contrast. RADIATION DOSE REDUCTION: This exam was performed according to the departmental dose-optimization program which includes automated exposure control, adjustment of the mA and/or kV according to patient size and/or use of iterative reconstruction technique. CONTRAST:  18mL OMNIPAQUE IOHEXOL 350 MG/ML SOLN COMPARISON:  None Available. FINDINGS: Lower chest: No acute abnormality Hepatobiliary: Numerous large masses throughout the liver compatible with metastases. Index left hepatic mass on image 22 measures up to 7.6 cm. Index right hepatic mass on image 35 measures up to 8.6 cm. Gallbladder unremarkable. Pancreas: No focal abnormality or ductal dilatation. Spleen: No focal abnormality.  Normal size. Adrenals/Urinary Tract: No adrenal abnormality. No focal renal abnormality. No stones or hydronephrosis. Urinary bladder is unremarkable. Stomach/Bowel: No visible abnormality within the colon. However, there is ill-defined spiculated mass within the mesentery adjacent to the sigmoid colon measuring 2.9 x 2.1 cm. Given this finding and the masses within the liver, primary colon cancer is a concern. Stomach and small bowel decompressed, grossly unremarkable. Vascular/Lymphatic: Scattered aortic calcifications. No aneurysm. No retroperitoneal or inguinal  adenopathy. Reproductive: No visible focal abnormality. Other: No free fluid or free air. Musculoskeletal: No acute bony abnormality or focal bone lesion. IMPRESSION: Numerous large enhancing masses within the liver most compatible with metastases. Primary source not clearly visualized. However, there is abnormal soft tissue in the mesentery leading to the sigmoid colon which is concerning for adenopathy, possibly related to sigmoid colon malignancy although not visualized by CT. Consider further evaluation with colonoscopy. Electronically Signed   By: Rolm Baptise M.D.   On: 11/16/2022 02:21    Pending Labs Unresulted Labs (From admission, onward)     Start     Ordered   11/16/22 0054  Hepatitis panel, acute  Once,   URGENT        11/16/22 0053  Vitals/Pain Today's Vitals   11/16/22 0351 11/16/22 0355 11/16/22 0400 11/16/22 0419  BP:   111/67   Pulse:   63   Resp:   14   Temp:  97.9 F (36.6 C)    TempSrc:  Temporal    SpO2:   93%   Weight:      Height:      PainSc: 6    Asleep    Isolation Precautions No active isolations  Medications Medications  fentaNYL (SUBLIMAZE) injection 50 mcg (50 mcg Intravenous Given 11/16/22 0352)  fentaNYL (SUBLIMAZE) injection 100 mcg (100 mcg Intravenous Given 11/16/22 0118)  ondansetron (ZOFRAN) injection 4 mg (4 mg Intravenous Given 11/16/22 0118)  iohexol (OMNIPAQUE) 350 MG/ML injection 75 mL (75 mLs Intravenous Contrast Given 11/16/22 0214)    Mobility walks     Focused Assessments Abd pain: worse after eating or having BM   R Recommendations: See Admitting Provider Note  Report given to:   Additional Notes:

## 2022-11-16 NOTE — H&P (Signed)
History and Physical    Patient: Richard Bowman [id number] DOB: 1976-04-17 DOA: 11/15/2022 DOS: the patient was seen and examined on 11/16/2022 PCP: [name], PA-C  Patient coming from: Home - lives alone; NOK: [place], [place], [phone]   Chief Complaint: rectal bleeding  HPI: Richard Bowman is a 47 y.o. male with medical history significant of esophageal ulcer presenting with rectal bleeding.  He reports that he noticed rectal bleeding.  Bleeding started about 2-3 days ago.  Mainly just blood, ?mucus.  Diffuse abdominal pain, radiating to his back and rectum - present for months.  He has not seen a doctor about this.  No n/v.  BMs have not been for 2-3 months, more like "pebbles", no diarrhea.  +weight loss, took laxatives twice in the last 2 weeks so thought 20-pound weight loss was related to that.  Occasional night sweats.    ER Course:  Carryover, per Dr. Marlowe Sax:  47 year old male presented to the ED with complaints of abdominal pain, constipation, and rectal bleeding.  LFTs elevated.  CT concerning for liver mets and possible sigmoid colon malignancy as the primary source.  He had some external hemorrhoids and grossly bloody stool on rectal exam.  Hemoglobin stable.  GI and oncology need to be consulted.      Review of Systems: As mentioned in the history of present illness. All other systems reviewed and are negative. Past Medical History:  Diagnosis Date   Esophageal ulcer 2007   Renal disorder    CKD 10/28/20 "doesn't have it now"   History reviewed. No pertinent surgical history. Social History:  reports that he has been smoking cigarettes. He has a 26.00 pack-year smoking history. He has never used smokeless tobacco. He reports current alcohol use. He reports that he does not use drugs.  No Known Allergies  Family History  Problem Relation Age of Onset   COPD Mother    Cancer Mother     Prior to Admission medications   Not on File     Physical Exam: Vitals:   11/16/22 0730 11/16/22 0753 11/16/22 0823 11/16/22 1616  BP: 125/82  (!) 127/97 121/74  Pulse: 90  71 62  Resp: 19  18 18   Temp:  98 F (36.7 C) 98.7 F (37.1 C) 98.2 F (36.8 C)  TempSrc:  Oral Oral   SpO2: 96%  96% 96%  Weight:      Height:       General:  Appears calm and comfortable and is in NAD Eyes: EOMI, normal lids, iris ENT:  grossly normal hearing, lips & tongue, mmm Neck:  no LAD, masses or thyromegaly Cardiovascular:  RRR, no m/r/g. No LE edema.  Respiratory:   CTA bilaterally with no wheezes/rales/rhonchi.  Normal respiratory effort. Abdomen:  soft, diffuse abdominal pain, ND Skin:  no rash or induration seen on limited exam Musculoskeletal:  grossly normal tone BUE/BLE, good ROM, no bony abnormality Psychiatric:  blunted mood and affect, speech fluent and appropriate, AOx3 Neurologic:  CN 2-12 grossly intact, moves all extremities in coordinated fashion   Radiological Exams on Admission: Independently reviewed - see discussion in A/P where applicable  CT ABDOMEN PELVIS W CONTRAST  Result Date: 11/16/2022 CLINICAL DATA:  Abdominal pain EXAM: CT ABDOMEN AND PELVIS WITH CONTRAST TECHNIQUE: Multidetector CT imaging of the abdomen and pelvis was performed using the standard protocol following bolus administration of intravenous contrast. RADIATION DOSE REDUCTION: This exam was performed according to the departmental dose-optimization program which includes automated  exposure control, adjustment of the mA and/or kV according to patient size and/or use of iterative reconstruction technique. CONTRAST:  25mL OMNIPAQUE IOHEXOL 350 MG/ML SOLN COMPARISON:  None Available. FINDINGS: Lower chest: No acute abnormality Hepatobiliary: Numerous large masses throughout the liver compatible with metastases. Index left hepatic mass on image 22 measures up to 7.6 cm. Index right hepatic mass on image 35 measures up to 8.6 cm. Gallbladder unremarkable.  Pancreas: No focal abnormality or ductal dilatation. Spleen: No focal abnormality.  Normal size. Adrenals/Urinary Tract: No adrenal abnormality. No focal renal abnormality. No stones or hydronephrosis. Urinary bladder is unremarkable. Stomach/Bowel: No visible abnormality within the colon. However, there is ill-defined spiculated mass within the mesentery adjacent to the sigmoid colon measuring 2.9 x 2.1 cm. Given this finding and the masses within the liver, primary colon cancer is a concern. Stomach and small bowel decompressed, grossly unremarkable. Vascular/Lymphatic: Scattered aortic calcifications. No aneurysm. No retroperitoneal or inguinal adenopathy. Reproductive: No visible focal abnormality. Other: No free fluid or free air. Musculoskeletal: No acute bony abnormality or focal bone lesion. IMPRESSION: Numerous large enhancing masses within the liver most compatible with metastases. Primary source not clearly visualized. However, there is abnormal soft tissue in the mesentery leading to the sigmoid colon which is concerning for adenopathy, possibly related to sigmoid colon malignancy although not visualized by CT. Consider further evaluation with colonoscopy. Electronically Signed   By: Rolm Baptise M.D.   On: 11/16/2022 02:21    EKG: Independently reviewed.  NSR with rate 72;  no evidence of acute ischemia   Labs on Admission: I have personally reviewed the available labs and imaging studies at the time of the admission.  Pertinent labs:    Glucose 151 AP 296 Albumin 3.4 AST 107/ALT 83 WBC 12.6 UA WNL   Assessment and Plan: Principal Problem:   Rectal bleeding Active Problems:   Smoker   Transaminitis    Rectal bleeding -Patient with very remote h/o esophageal ulcer presenting with rectal bleeding, abdominal pain, weight loss -Mild LFT elevation -CT with concern for sigmoid colon malignancy with numerous liver mets -Will admit to med surg -Continue to monitor for recurrent  bleeding -GI consult has been requested -He appears to need colonoscopy (or flex sig, but given no prior colonoscopy this would be preferred if possible to pass the mass) -Oncology consulted -CT chest ordered to assess for further disease -He may need IR consult to biopsy liver masses, but will hold for now pending colonoscopy (planned with EGD for tomorrow)  Tobacco dependence -Encourage cessation.   -This was discussed with the patient and should be reviewed on an ongoing basis.   -Patch ordered at patient request.        Advance Care Planning:   Code Status: Full Code - Code status was discussed with the patient at the time of admission.  The patient would want to receive full resuscitative measures at this time.   Consults: GI, oncology; may need IR; TOC team  DVT Prophylaxis: SCDs  Family Communication: None present; he declined to have me contact family at the time of admission  Severity of Illness: The appropriate patient status for this patient is INPATIENT. Inpatient status is judged to be reasonable and necessary in order to provide the required intensity of service to ensure the patient's safety. The patient's presenting symptoms, physical exam findings, and initial radiographic and laboratory data in the context of their chronic comorbidities is felt to place them at high risk for further clinical deterioration.  Furthermore, it is not anticipated that the patient will be medically stable for discharge from the hospital within 2 midnights of admission.   * I certify that at the point of admission it is my clinical judgment that the patient will require inpatient hospital care spanning beyond 2 midnights from the point of admission due to high intensity of service, high risk for further deterioration and high frequency of surveillance required.*  Author: Karmen Bongo, MD 11/16/2022 6:36 PM  For on call review [email or web address].

## 2022-11-16 NOTE — ED Notes (Signed)
Pt taken to CT.

## 2022-11-16 NOTE — Consult Note (Addendum)
Consultation  Referring Provider:  [facility]/ yates Primary Care Physician:  [name], PA-C Primary Gastroenterologist: unassigned  Reason for Consultation: 1 month history of abdominal pain, constipation, intermittent blood in stool and abnormal CT abdomen pelvis  HPI: Richard Bowman is a 47 y.o. male, generally in good health who had been admitted through the emergency room last evening after he presented with several week history of abdominal pain rather generalized and worse postprandially.  He had also developed constipation over the past 4 to 6 weeks and noted some bright red blood in his bowel movements just over this past week intermittently.  He has not had any nausea or vomiting, no dysphagia or odynophagia. He has noticed about a 20 pound weight loss over the past couple of months. CT of the abdomen and pelvis was done through the emergency room which shows numerous large liver masses consistent with metastatic disease 2 of the larger masses measure 7.6 cm and one 8.6 cm lesion and no ductal dilation.  There is an ill-defined spiculated mass in the mesentery adjacent to the sigmoid colon measuring 2.9 x 2.1 cm but no definite colonic mass.  Lower chest read as unremarkable. Labs showed WBC of 12.6/hemoglobin 13.2 Lipase 35 LFTs with bili 0.6/alk phos 296/AST 107/ALT 83 UA negative.  Patient says that he did have an ulcer many years ago, other than that has been in good health. Is a smoker, rare EtOH No family history of intestinal malignancies that he is aware of.   Past Medical History:  Diagnosis Date   Esophageal ulcer 2007   Renal disorder    CKD 10/28/20 "doesn't have it now"    History reviewed. No pertinent surgical history.  Prior to Admission medications   Not on File    Current Facility-Administered Medications  Medication Dose Route Frequency Provider Last Rate Last Admin   acetaminophen (TYLENOL) tablet 650 mg  650 mg Oral Q6H PRN Karmen Bongo, MD        Or   acetaminophen (TYLENOL) suppository 650 mg  650 mg Rectal Q6H PRN Karmen Bongo, MD       hydrALAZINE (APRESOLINE) injection 5 mg  5 mg Intravenous Q4H PRN Karmen Bongo, MD       lactated ringers infusion   Intravenous Continuous Karmen Bongo, MD 100 mL/hr at 11/16/22 1044 New Bag at 11/16/22 1044   morphine (PF) 2 MG/ML injection 2 mg  2 mg Intravenous Q2H PRN Karmen Bongo, MD   2 mg at 11/16/22 1046   ondansetron (ZOFRAN) tablet 4 mg  4 mg Oral Q6H PRN Karmen Bongo, MD       Or   ondansetron [facility]) injection 4 mg  4 mg Intravenous Q6H PRN Karmen Bongo, MD        Allergies as of 11/15/2022   (No Known Allergies)    Family History  Problem Relation Age of Onset   COPD Mother    Cancer Mother     Social History   Socioeconomic History   Marital status: Single    Spouse name: Not on file   Number of children: 0   Years of education: Not on file   Highest education level: Associate degree: occupational, Hotel manager, or vocational program  Occupational History    Comment: NA   Occupation: Dealer  Tobacco Use   Smoking status: Every Day    Packs/day: 1.00    Years: 26.00    Additional pack years: 0.00    Total pack years:  26.00    Types: Cigarettes   Smokeless tobacco: Never   Tobacco comments:    Smoking 1/2 - 1 ppd  Substance and Sexual Activity   Alcohol use: Yes    Alcohol/week: 0.0 standard drinks of alcohol    Comment: occ   Drug use: No   Sexual activity: Not Currently  Other Topics Concern   Not on file  Social History Narrative   Not on file   Social Determinants of Health   Financial Resource Strain: Not on file  Food Insecurity: Patient Declined (11/16/2022)   Hunger Vital Sign    Worried About Running Out of Food in the Last Year: Patient declined    [place] in the Last Year: Patient declined  Transportation Needs: Patient Declined (11/16/2022)   PRAPARE - Hydrologist (Medical): Patient  declined    Lack of Transportation (Non-Medical): Patient declined  Physical Activity: Not on file  Stress: Not on file  Social Connections: Not on file  Intimate Partner Violence: Unknown (11/16/2022)   Humiliation, Afraid, Rape, and Kick questionnaire    Fear of Current or Ex-Partner: Patient declined    Emotionally Abused: Patient declined    Physically Abused: Not on file    Sexually Abused: Patient declined    Review of Systems: Pertinent positive and negative review of systems were noted in the above HPI section.  All other review of systems was otherwise negative.   Physical Exam: Vital signs in last 24 hours: Temp:  [97.9 F (36.6 C)-98.7 F (37.1 C)] 98.7 F (37.1 C) (03/18 0823) Pulse Rate:  [63-90] 71 (03/18 0823) Resp:  [14-23] 18 (03/18 0823) BP: (97-140)/(62-97) 127/97 (03/18 0823) SpO2:  [93 %-99 %] 96 % (03/18 0823) Weight:  [91.6 kg] 91.6 kg (03/17 2208) Last BM Date : 11/12/22 General:   Alert,  Well-developed, well-nourished, white male ,pleasant and cooperative in NAD Head:  Normocephalic and atraumatic. Eyes:  Sclera clear, no icterus.   Conjunctiva pink. Ears:  Normal auditory acuity. Nose:  No deformity, discharge,  or lesions. Mouth:  No deformity or lesions.   Neck:  Supple; no masses or thyromegaly. Lungs:  Clear throughout to auscultation.   No wheezes, crackles, or rhonchi.  Heart:  Regular rate and rhythm; no murmurs, clicks, rubs,  or gallops. Abdomen:  Soft, rather generalized tenderness, liver is palpable down 3 to 4 fingerbreadths below the right costal margin, hard and tender outpatient ,BS active,nonpalp mass or hsm.   Rectal: Not done-per ER MD showing small external hemorrhoids, no mass noted bright red blood on exam glove Msk:  Symmetrical without gross deformities. . Pulses:  Normal pulses noted. Extremities:  Without clubbing or edema. Neurologic:  Alert and  oriented x4;  grossly normal neurologically. Skin:  Intact without  significant lesions or rashes.. Psych:  Alert and cooperative. Normal mood and affect.  Intake/Output from previous day: No intake/output data recorded. Intake/Output this shift: No intake/output data recorded.  Lab Results: Recent Labs    11/15/22 2208  WBC 12.6*  HGB 13.2  HCT 39.0  PLT 285   BMET Recent Labs    11/15/22 2208  NA 135  K 3.9  CL 99  CO2 22  GLUCOSE 151*  BUN 10  CREATININE 1.05  CALCIUM 9.2   LFT Recent Labs    11/15/22 2208  PROT 6.9  ALBUMIN 3.4*  AST 107*  ALT 83*  ALKPHOS 296*  BILITOT 0.6   PT/INR No results  for input(s): "LABPROT", "INR" in the last 72 hours. Hepatitis Panel Recent Labs    11/16/22 0918  HEPBSAG NON REACTIVE  HCVAB NON REACTIVE  HEPAIGM NON REACTIVE  HEPBIGM NON REACTIVE     IMPRESSION:  #78 47 year old white male with 4 to 6-week history of progressive abdominal pain worse postprandially, no nausea or vomiting, new onset of constipation, and noted blood mixed in with his bowel movement over the past couple of days intermittently.  Workup thus far with CT of the abdomen pelvis unfortunately shows numerous liver masses consistent with metastatic disease, 2 of the larger lesions measure 7.6 cm and 8.6 cm.  There is an ill-defined spiculated mass in the mesentery adjacent to the sigmoid colon measuring 2.9 x 2.1 cm but no definite colonic lesion noted  Uncertain at this time whether this represents sigmoid colon cancer with metastatic disease to the liver, versus other intra-abdominal malignancy with hepatic metastatic disease.  Patient says he had to take laxatives on a couple of occasions over the past month and had severe cramping and pain with these, but felt better afterwards.  #2 elevated LFTs-very likely secondary to metastatic disease in the liver  #3 smoker  PLAN: Liquid diet today, n.p.o. after midnight Will plan for bowel prep later today and this evening, and have scheduled for colonoscopy and EGD  with Dr. Silverio Decamp  for tomorrow 11/17/2022.  Both procedures were discussed in detail with the patient including indications risk and benefits and he is agreeable to proceed. Check CEA Will need CT of the chest done as well. Further recommendations pending findings at endoscopic evaluation   Amy Esterwood  PA-C3/18/2024, 11:54 AM   Attending physician's note  I have taken a history, reviewed the chart and examined the patient. I performed a substantive portion of this encounter, including complete performance of at least one of the key components, in conjunction with the APP. I agree with the APP's note, impression and recommendations.   47 year old very pleasant gentleman admitted with abdominal pain constipation and intermittent blood in stool. CT abdomen pelvis with ill-defined spiculated mass in the mesentery adjacent to sigmoid colon and multiple liver lesions concerning for metastatic disease  Will plan for EGD and colonoscopy for further evaluation, identify possible primary Follow-up CEA level  Abnormal LFT secondary to multiple liver lesions /possible metastatic disease Clear liquids Bowel prep N.p.o. after 5 AM   The patient was provided an opportunity to ask questions and all were answered. The patient agreed with the plan and demonstrated an understanding of the instructions.  Damaris Hippo , MD [phone]

## 2022-11-16 NOTE — ED Provider Notes (Signed)
[facility] Provider Note   CSN: [id number] Arrival date & time: 11/15/22  2145     History  Chief Complaint  Patient presents with   Abdominal Pain    Richard Bowman is a 47 y.o. male.  The history is provided by the patient.  Abdominal Pain Pain location:  Generalized Pain radiates to:  Does not radiate Pain severity:  Moderate Onset quality:  Gradual Timing:  Intermittent Progression:  Worsening Chronicity:  New Relieved by:  Nothing Associated symptoms: constipation and hematochezia   Associated symptoms: no chest pain, no diarrhea, no dysuria, no fever, no shortness of breath and no vomiting   Risk factors: has not had multiple surgeries   Patient who is otherwise healthy at baseline presents with multiple abdominal complaints.  He reports over the past month he has had intermittent constipation.  At times it would improve with laxatives, but then it recurs.  He also reports diffuse abdominal pain that seems to be worse with eating.  No fevers or vomiting.  He also reports rectal pain and hemorrhoids and has had blood in his stool. No previous abdominal surgeries.  He does not take chronic pain meds     Past Medical History:  Diagnosis Date   Esophageal ulcer    Renal disorder    CKD 10/28/20 "doesn't have it now"    Home Medications Prior to Admission medications   Not on File      Allergies    Patient has no known allergies.    Review of Systems   Review of Systems  Constitutional:  Negative for fever.  Respiratory:  Negative for shortness of breath.   Cardiovascular:  Negative for chest pain.  Gastrointestinal:  Positive for abdominal pain, blood in stool, constipation and hematochezia. Negative for diarrhea and vomiting.  Genitourinary:  Negative for dysuria.    Physical Exam Updated Vital Signs BP 111/67   Pulse 63   Temp 97.9 F (36.6 C) (Temporal)   Resp 14   Ht 1.778 m (5\' 10" )   Wt 91.6 kg    SpO2 93%   BMI 28.98 kg/m  Physical Exam CONSTITUTIONAL: Well developed/well nourished HEAD: Normocephalic/atraumatic EYES: EOMI ENMT: Mucous membranes moist NECK: supple no meningeal signs CV: S1/S2 noted, no murmurs/rubs/gallops noted LUNGS: Lungs are clear to auscultation bilaterally, no apparent distress ABDOMEN: soft, moderate diffuse tenderness, no rebound or guarding, bowel sounds noted throughout abdomen GU:no cva tenderness Rectal - small external hemorrhoids noted. No mass, no abscess, no thrombosed hemorrhoid,  No melena.  Bright blood per rectum is noted.  Nurse [place] present for exam NEURO: Pt is awake/alert/appropriate, moves all extremitiesx4.  No facial droop.   EXTREMITIES: pulses normal/equal, full ROM SKIN: warm, color normal PSYCH: no abnormalities of mood noted, alert and oriented to situation  ED Results / Procedures / Treatments   Labs (all labs ordered are listed, but only abnormal results are displayed) Labs Reviewed  COMPREHENSIVE METABOLIC PANEL - Abnormal; Notable for the following components:      Result Value   Glucose, Bld 151 (*)    Albumin 3.4 (*)    AST 107 (*)    ALT 83 (*)    Alkaline Phosphatase 296 (*)    All other components within normal limits  CBC - Abnormal; Notable for the following components:   WBC 12.6 (*)    All other components within normal limits  LIPASE, BLOOD  URINALYSIS, ROUTINE W REFLEX MICROSCOPIC  HEPATITIS  PANEL, ACUTE    EKG EKG Interpretation  Date/Time:  Monday November 16 2022 01:22:53 EDT Ventricular Rate:  72 PR Interval:  137 QRS Duration: 109 QT Interval:  391 QTC Calculation: 428 R Axis:   41 Text Interpretation: Sinus rhythm Confirmed by Ripley Fraise [phone]) on 11/16/2022 1:24:21 AM  Radiology CT ABDOMEN PELVIS W CONTRAST  Result Date: 11/16/2022 CLINICAL DATA:  Abdominal pain EXAM: CT ABDOMEN AND PELVIS WITH CONTRAST TECHNIQUE: Multidetector CT imaging of the abdomen and pelvis was  performed using the standard protocol following bolus administration of intravenous contrast. RADIATION DOSE REDUCTION: This exam was performed according to the departmental dose-optimization program which includes automated exposure control, adjustment of the mA and/or kV according to patient size and/or use of iterative reconstruction technique. CONTRAST:  24mL OMNIPAQUE IOHEXOL 350 MG/ML SOLN COMPARISON:  None Available. FINDINGS: Lower chest: No acute abnormality Hepatobiliary: Numerous large masses throughout the liver compatible with metastases. Index left hepatic mass on image 22 measures up to 7.6 cm. Index right hepatic mass on image 35 measures up to 8.6 cm. Gallbladder unremarkable. Pancreas: No focal abnormality or ductal dilatation. Spleen: No focal abnormality.  Normal size. Adrenals/Urinary Tract: No adrenal abnormality. No focal renal abnormality. No stones or hydronephrosis. Urinary bladder is unremarkable. Stomach/Bowel: No visible abnormality within the colon. However, there is ill-defined spiculated mass within the mesentery adjacent to the sigmoid colon measuring 2.9 x 2.1 cm. Given this finding and the masses within the liver, primary colon cancer is a concern. Stomach and small bowel decompressed, grossly unremarkable. Vascular/Lymphatic: Scattered aortic calcifications. No aneurysm. No retroperitoneal or inguinal adenopathy. Reproductive: No visible focal abnormality. Other: No free fluid or free air. Musculoskeletal: No acute bony abnormality or focal bone lesion. IMPRESSION: Numerous large enhancing masses within the liver most compatible with metastases. Primary source not clearly visualized. However, there is abnormal soft tissue in the mesentery leading to the sigmoid colon which is concerning for adenopathy, possibly related to sigmoid colon malignancy although not visualized by CT. Consider further evaluation with colonoscopy. Electronically Signed   By: Rolm Baptise M.D.   On:  11/16/2022 02:21    Procedures Procedures    Medications Ordered in ED Medications  fentaNYL (SUBLIMAZE) injection 50 mcg (50 mcg Intravenous Given 11/16/22 0352)  fentaNYL (SUBLIMAZE) injection 100 mcg (100 mcg Intravenous Given 11/16/22 0118)  ondansetron (ZOFRAN) injection 4 mg (4 mg Intravenous Given 11/16/22 0118)  iohexol (OMNIPAQUE) 350 MG/ML injection 75 mL (75 mLs Intravenous Contrast Given 11/16/22 0214)    ED Course/ Medical Decision Making/ A&P Clinical Course as of 11/16/22 0433  Mon Nov 16, 2022  0052 Glucose(!): 151 Hyperglycemia [DW]  0052 AST(!): 107 Transaminitis [DW]  0052 WBC(!): 12.6 Leukocytosis [DW]  0247 Patient found to have metastasis to the liver of unknown source, but could be from colon.  Patient did have significant rectal bleeding.  Discussed at length the findings with the patient.  Plan will be to admit to the hospital for rectal bleeding, GI consultation and further evaluation [DW]  [phone] Discussed with Dr. Marlowe Sax, she will evaluate for admission.  Patient would benefit from admission, pain management, and evaluation of his rectal bleeding. [DW]    Clinical Course User Index [DW] Ripley Fraise, MD                             Medical Decision Making Amount and/or Complexity of Data Reviewed Labs: ordered. Decision-making details documented in ED Course.  Radiology: ordered. ECG/medicine tests: ordered.  Risk Prescription drug management. Decision regarding hospitalization.   This patient presents to the ED for concern of abdominal pain, this involves an extensive number of treatment options, and is a complaint that carries with it a high risk of complications and morbidity.  The differential diagnosis includes but is not limited to cholecystitis, cholelithiasis, pancreatitis, gastritis, peptic ulcer disease, appendicitis, bowel obstruction, bowel perforation, diverticulitis, AAA, ischemic bowel   Social Determinants of Health: Patient's lack  of insurance and impaired access to primary care  increases the complexity of managing their presentation  Additional history obtained: Records reviewed  outpatient records  Lab Tests: I Ordered, and personally interpreted labs.  The pertinent results include: Leukocytosis, transaminitis  Imaging Studies ordered: I ordered imaging studies including CT scan abdomen pelvis   I independently visualized and interpreted imaging which showed liver metastasis, possible colon cancer I agree with the radiologist interpretation   Medicines ordered and prescription drug management: I ordered medication including fentanyl for pain Reevaluation of the patient after these medicines showed that the patient    improved   Critical Interventions:   admission to the medical service for further evaluation and monitoring  Consultations Obtained: I requested consultation with the admitting physician Dr. Marlowe Sax , and discussed  findings as well as pertinent plan - they recommend: Admit  Reevaluation: After the interventions noted above, I reevaluated the patient and found that they have :improved  Complexity of problems addressed: Patient's presentation is most consistent with  acute presentation with potential threat to life or bodily function  Disposition: After consideration of the diagnostic results and the patient's response to treatment,  I feel that the patent would benefit from admission   .           Final Clinical Impression(s) / ED Diagnoses Final diagnoses:  Generalized abdominal pain  Metastasis to liver [facility])  Rectal bleeding    Rx / DC Orders ED Discharge Orders     None         Ripley Fraise, MD 11/16/22 [phone]

## 2022-11-16 NOTE — Plan of Care (Signed)
°  Problem: Activity: °Goal: Risk for activity intolerance will decrease °Outcome: Progressing °  °Problem: Safety: °Goal: Ability to remain free from injury will improve °Outcome: Progressing °  °Problem: Nutrition: °Goal: Adequate nutrition will be maintained °Outcome: Not Progressing °  °Problem: Pain Managment: °Goal: General experience of comfort will improve °Outcome: Not Progressing °  °

## 2022-11-16 NOTE — Consult Note (Signed)
Referral MD  Reason for Referral: Multiple hepatic masses-sigmoid colon mass  Chief Complaint  Patient presents with   Abdominal Pain  : I had abdominal pain and was not feeling well.  HPI: Richard Bowman is a very nice 47 year old white male.  He is from [place].  He is a Dealer.  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  He says he had some bouts of bright red blood per rectum.  He thought this was just hemorrhoids.  He began to have some abdominal pain over the past month or so.  His appetite was down a little bit.  He had no nausea or vomiting.  He had some changes in stools.  Again he had occasional bright red blood per rectum.  There is no melena.  He is subsequently came to the emergency room.  Again, this was for the bleeding.  He has about a 20 pound weight loss.  He said he took a laxative which did help for a little bit.  He has some occasional fevers and sweats.  He, I do not think, has a family doctor.  He says he does not like to go to doctors.  The emergency room.  He subsequently was admitted on 11/16/2022.  He did have a CT scan that was done.  This showed multiple large liver masses.  There was noted to be a 2.9 x 2 1 cm mass adjacent to the sigmoid colon.  He has no external hemorrhoids.  His labs showed a sodium 135.  Potassium 3.9.  BUN 10 creatinine 1.05.  Calcium is 9.2 with an albumin of 3.4.  SGPT 83 SGOT 107.  Bilirubin 0.6.  His CBC shows white cell count 12.6.  Hemoglobin 13.2.  Platelet count 285,000.  He has been seen by gastroenterology.  I think he will have a colonoscopy tomorrow.  There is no family history of colon cancer.  He does smoke.  He probably has about a 20-pack-year history of tobacco use.  He has rare alcohol use.  He has had no problems with COVID.  He is never been vaccinated.  He has not noted any change in urinary habits.  He has had no leg swelling.  There is been no rashes.  He has had no headache.  He has had no dysphagia or odynophagia.   He denies any cough or shortness of breath.  There is no chest wall pain.  Overall, I would say that his performance status is probably ECOG 0.    Past Medical History:  Diagnosis Date   Esophageal ulcer 2007   Renal disorder    CKD 10/28/20 "doesn't have it now"  :  History reviewed. No pertinent surgical history.:   Current Facility-Administered Medications:    acetaminophen (TYLENOL) tablet 650 mg, 650 mg, Oral, Q6H PRN **OR** acetaminophen (TYLENOL) suppository 650 mg, 650 mg, Rectal, Q6H PRN, Karmen Bongo, MD   hydrALAZINE (APRESOLINE) injection 5 mg, 5 mg, Intravenous, Q4H PRN, Karmen Bongo, MD   lactated ringers infusion, , Intravenous, Continuous, Karmen Bongo, MD, Last Rate: 100 mL/hr at 11/16/22 1044, New Bag at 11/16/22 1044   morphine (PF) 2 MG/ML injection 2 mg, 2 mg, Intravenous, Q2H PRN, Karmen Bongo, MD, 2 mg at 11/16/22 1457   ondansetron (ZOFRAN) tablet 4 mg, 4 mg, Oral, Q6H PRN **OR** ondansetron (ZOFRAN) injection 4 mg, 4 mg, Intravenous, Q6H PRN, Karmen Bongo, MD   peg 3350 powder (MOVIPREP) kit 100 g, 0.5 kit, Oral, Once **AND** peg 3350 powder (MOVIPREP) kit 100  g, 0.5 kit, Oral, Once, Levada Dy, Dwayne A, RPH:   peg 3350 powder  0.5 kit Oral Once   And   peg 3350 powder  0.5 kit Oral Once  :  No Known Allergies:   Family History  Problem Relation Age of Onset   COPD Mother    Cancer Mother   :   Social History   Socioeconomic History   Marital status: Single    Spouse name: Not on file   Number of children: 0   Years of education: Not on file   Highest education level: Associate degree: occupational, Hotel manager, or vocational program  Occupational History    Comment: NA   Occupation: Dealer  Tobacco Use   Smoking status: Every Day    Packs/day: 1.00    Years: 26.00    Additional pack years: 0.00    Total pack years: 26.00    Types: Cigarettes   Smokeless tobacco: Never   Tobacco comments:    Smoking 1/2 - 1 ppd  Substance  and Sexual Activity   Alcohol use: Yes    Alcohol/week: 0.0 standard drinks of alcohol    Comment: occ   Drug use: No   Sexual activity: Not Currently  Other Topics Concern   Not on file  Social History Narrative   Not on file   Social Determinants of Health   Financial Resource Strain: Not on file  Food Insecurity: Patient Declined (11/16/2022)   Hunger Vital Sign    Worried About Running Out of Food in the Last Year: Patient declined    [place] in the Last Year: Patient declined  Transportation Needs: Patient Declined (11/16/2022)   PRAPARE - Hydrologist (Medical): Patient declined    Lack of Transportation (Non-Medical): Patient declined  Physical Activity: Not on file  Stress: Not on file  Social Connections: Not on file  Intimate Partner Violence: Unknown (11/16/2022)   Humiliation, Afraid, Rape, and Kick questionnaire    Fear of Current or Ex-Partner: Patient declined    Emotionally Abused: Patient declined    Physically Abused: Not on file    Sexually Abused: Patient declined  :  Review of Systems  Constitutional:  Positive for malaise/fatigue and weight loss.  HENT: Negative.    Eyes: Negative.   Respiratory: Negative.    Cardiovascular: Negative.   Gastrointestinal:  Positive for abdominal pain and blood in stool.  Genitourinary: Negative.   Musculoskeletal: Negative.   Skin: Negative.   Neurological: Negative.   Endo/Heme/Allergies: Negative.   Psychiatric/Behavioral: Negative.       Exam: On his physical exam, his vital signs are temperature 98.2.  Pulse 62.  Blood pressure 121/74.  His weight is 202 pounds.   Patient Vitals for the past 24 hrs:  BP Temp Temp src Pulse Resp SpO2 Height Weight  11/16/22 1616 121/74 98.2 F (36.8 C) -- 62 18 96 % -- --  11/16/22 0823 (!) 127/97 98.7 F (37.1 C) Oral 71 18 96 % -- --  11/16/22 0753 -- 98 F (36.7 C) Oral -- -- -- -- --  11/16/22 0730 125/82 -- -- 90 19 96 % -- --   11/16/22 0700 109/67 -- -- 64 14 94 % -- --  11/16/22 0400 111/67 -- -- 63 14 93 % -- --  11/16/22 0355 -- 97.9 F (36.6 C) Temporal -- -- -- -- --  11/16/22 0300 97/62 -- -- 67 17 97 % -- --  11/16/22 0100  124/72 -- -- 71 20 99 % -- --  11/16/22 0030 128/77 -- -- 70 (!) 23 98 % -- --  11/15/22 2208 -- -- -- -- -- -- 5\' 10"  (1.778 m) 202 lb (91.6 kg)  11/15/22 2151 (!) 140/89 98.5 F (36.9 C) Oral 90 16 97 % -- --   Physical Exam Vitals reviewed.  HENT:     Head: Normocephalic and atraumatic.  Eyes:     Pupils: Pupils are equal, round, and reactive to light.  Cardiovascular:     Rate and Rhythm: Normal rate and regular rhythm.     Heart sounds: Normal heart sounds.  Pulmonary:     Effort: Pulmonary effort is normal.     Breath sounds: Normal breath sounds.  Abdominal:     General: Bowel sounds are normal.     Palpations: Abdomen is soft.     Comments: His abdominal exam is soft.  Bowel sounds are present.  He has no obvious fluid wave.  There is no guarding or rebound tenderness.  He does have hepatomegaly.  His liver edge is probably about 4-5 cm below the right costal margin.  Musculoskeletal:        General: No tenderness or deformity. Normal range of motion.     Cervical back: Normal range of motion.  Lymphadenopathy:     Cervical: No cervical adenopathy.  Skin:    General: Skin is warm and dry.     Findings: No erythema or rash.  Neurological:     Mental Status: He is alert and oriented to person, place, and time.  Psychiatric:        Behavior: Behavior normal.        Thought Content: Thought content normal.        Judgment: Judgment normal.     Recent Labs    11/15/22 2208  WBC 12.6*  HGB 13.2  HCT 39.0  PLT 285    Recent Labs    11/15/22 2208  NA 135  K 3.9  CL 99  CO2 22  GLUCOSE 151*  BUN 10  CREATININE 1.05  CALCIUM 9.2    Blood smear review: None  Pathology: Pending    Assessment and Plan: Mr. Mcalpine is a very nice 47 year old  white male.  He, in all likelihood, has metastatic colon cancer.  He will be interesting to see what his CEA level is.  He is going to have a colonoscopy to see what is going on.  He possibly, and more likely, has a primary sigmoid colonic mass.  We will have to see if there is any issues with respect to obstruction.  He does need to have a CT scan of the chest.  We need to see if there is any evidence of metastatic disease in the thoracic cavity.  Personally, I think would be very helpful to get biopsies of one of the liver masses.  I would think that this would be very helpful for [place] when we send off molecular analysis.  In a lot of situations, the tumor changes it is DNA from the primary site to the metastatic site.  I do hate to put needles into patients.  However, this might be one of the situations where it would be helpful to get a liver biopsy to get adequate material so that we can do our molecular studies.  I would think that given his young age, he certainly could have a targetable mutation that we can utilize for treatment.  At  this point, it looks like his disease is just in the liver.  Maybe, if we can get a good response to treatment and get good liver shrinkage, then the tumors in the liver might be resectable.  He is young.  He is in great shape.  We can definitely be aggressive with respect to his protocol.  We will follow along.  We really have no recommendations to make until we get the pathology back.   Lattie Haw, MD  Richard Bowman 1:5

## 2022-11-17 ENCOUNTER — Inpatient Hospital Stay (HOSPITAL_COMMUNITY): Payer: Medicaid Other

## 2022-11-17 ENCOUNTER — Inpatient Hospital Stay (HOSPITAL_COMMUNITY): Payer: Medicaid Other | Admitting: Certified Registered Nurse Anesthetist

## 2022-11-17 ENCOUNTER — Encounter (HOSPITAL_COMMUNITY)
Admission: EM | Disposition: A | Discharge status: Home / Self Care | Payer: Self-pay | Source: Home / Self Care | Attending: Internal Medicine

## 2022-11-17 ENCOUNTER — Encounter (HOSPITAL_COMMUNITY): Payer: Self-pay | Admitting: Internal Medicine

## 2022-11-17 DIAGNOSIS — K2970 Gastritis, unspecified, without bleeding: Secondary | ICD-10-CM

## 2022-11-17 DIAGNOSIS — R935 Abnormal findings on diagnostic imaging of other abdominal regions, including retroperitoneum: Secondary | ICD-10-CM

## 2022-11-17 DIAGNOSIS — C187 Malignant neoplasm of sigmoid colon: Principal | ICD-10-CM

## 2022-11-17 DIAGNOSIS — K6389 Other specified diseases of intestine: Secondary | ICD-10-CM

## 2022-11-17 DIAGNOSIS — K5669 Other partial intestinal obstruction: Secondary | ICD-10-CM

## 2022-11-17 DIAGNOSIS — K648 Other hemorrhoids: Secondary | ICD-10-CM

## 2022-11-17 DIAGNOSIS — C189 Malignant neoplasm of colon, unspecified: Secondary | ICD-10-CM

## 2022-11-17 DIAGNOSIS — C787 Secondary malignant neoplasm of liver and intrahepatic bile duct: Secondary | ICD-10-CM

## 2022-11-17 DIAGNOSIS — F17210 Nicotine dependence, cigarettes, uncomplicated: Secondary | ICD-10-CM

## 2022-11-17 DIAGNOSIS — R1084 Generalized abdominal pain: Principal | ICD-10-CM

## 2022-11-17 DIAGNOSIS — K56699 Other intestinal obstruction unspecified as to partial versus complete obstruction: Secondary | ICD-10-CM

## 2022-11-17 HISTORY — PX: SUBMUCOSAL TATTOO INJECTION: SHX6856

## 2022-11-17 HISTORY — PX: COLONOSCOPY WITH PROPOFOL: SHX5780

## 2022-11-17 HISTORY — PX: ESOPHAGOGASTRODUODENOSCOPY (EGD) WITH PROPOFOL: SHX5813

## 2022-11-17 HISTORY — PX: BIOPSY: SHX5522

## 2022-11-17 LAB — CEA: CEA: 140 ng/mL — ABNORMAL HIGH (ref 0.0–4.7)

## 2022-11-17 SURGERY — COLONOSCOPY WITH PROPOFOL
Anesthesia: Monitor Anesthesia Care

## 2022-11-17 MED ORDER — HYDROMORPHONE HCL 1 MG/ML IJ SOLN
0.2500 mg | INTRAMUSCULAR | Status: DC | PRN
  Administered 2022-11-17 (×2): 0.5 mg via INTRAVENOUS

## 2022-11-17 MED ORDER — OXYCODONE HCL 5 MG PO TABS: 5.0000 mg | ORAL_TABLET | Freq: Once | ORAL | Status: DC | PRN

## 2022-11-17 MED ORDER — NICOTINE 21 MG/24HR TD PT24: 21.0000 mg | MEDICATED_PATCH | TRANSDERMAL | 1 refills | Status: DC

## 2022-11-17 MED ORDER — SODIUM CHLORIDE 0.9 % IV SOLN
2.0000 g | INTRAVENOUS | Status: AC
  Administered 2022-11-18: 2 g via INTRAVENOUS
  Filled 2022-11-17: qty 2

## 2022-11-17 MED ORDER — PROMETHAZINE HCL 25 MG/ML IJ SOLN: 6.2500 mg | INTRAMUSCULAR | Status: DC | PRN

## 2022-11-17 MED ORDER — CHLORHEXIDINE GLUCONATE CLOTH 2 % EX PADS
6.0000 | MEDICATED_PAD | Freq: Once | CUTANEOUS | Status: AC
  Administered 2022-11-17: 6 via TOPICAL

## 2022-11-17 MED ORDER — HYDROMORPHONE HCL 1 MG/ML IJ SOLN
1.0000 mg | INTRAMUSCULAR | Status: DC | PRN
  Administered 2022-11-17 – 2022-11-18 (×5): 1 mg via INTRAVENOUS
  Filled 2022-11-17 (×5): qty 1

## 2022-11-17 MED ORDER — PIPERACILLIN-TAZOBACTAM 3.375 G IVPB 30 MIN
3.3750 g | Freq: Once | INTRAVENOUS | Status: AC
  Administered 2022-11-17: 3.375 g via INTRAVENOUS
  Filled 2022-11-17: qty 50

## 2022-11-17 MED ORDER — BOOST / RESOURCE BREEZE PO LIQD CUSTOM
1.0000 | Freq: Three times a day (TID) | ORAL | Status: DC
  Administered 2022-11-17 (×2): 1 via ORAL

## 2022-11-17 MED ORDER — PROPOFOL 500 MG/50ML IV EMUL
INTRAVENOUS | Status: DC | PRN
  Administered 2022-11-17: 125 ug/kg/min via INTRAVENOUS

## 2022-11-17 MED ORDER — PIPERACILLIN-TAZOBACTAM 3.375 G IVPB
3.3750 g | Freq: Three times a day (TID) | INTRAVENOUS | Status: DC
  Administered 2022-11-18: 3.375 g via INTRAVENOUS
  Filled 2022-11-17: qty 50

## 2022-11-17 MED ORDER — OXYCODONE HCL 5 MG/5ML PO SOLN: 5.0000 mg | Freq: Once | ORAL | Status: DC | PRN

## 2022-11-17 MED ORDER — PANTOPRAZOLE SODIUM 40 MG PO TBEC
40.0000 mg | DELAYED_RELEASE_TABLET | Freq: Every day | ORAL | Status: DC
  Administered 2022-11-17 – 2022-11-23 (×6): 40 mg via ORAL
  Filled 2022-11-17 (×6): qty 1

## 2022-11-17 MED ORDER — HYDROMORPHONE HCL 1 MG/ML IJ SOLN
INTRAMUSCULAR | Status: AC
  Filled 2022-11-17: qty 1

## 2022-11-17 MED ORDER — PROPOFOL 10 MG/ML IV BOLUS
INTRAVENOUS | Status: DC | PRN
  Administered 2022-11-17: 20 ug via INTRAVENOUS

## 2022-11-17 MED ORDER — LACTATED RINGERS IV SOLN: INTRAVENOUS | Status: DC | PRN

## 2022-11-17 MED ORDER — LIDOCAINE 2% (20 MG/ML) 5 ML SYRINGE
INTRAMUSCULAR | Status: DC | PRN
  Administered 2022-11-17: 80 mg via INTRAVENOUS

## 2022-11-17 SURGICAL SUPPLY — 25 items

## 2022-11-17 NOTE — Anesthesia Preprocedure Evaluation (Signed)
Anesthesia Evaluation  Patient identified by MRN, date of birth, ID band Patient awake    Reviewed: Allergy & Precautions, H&P , NPO status , Patient's Chart, lab work & pertinent test results  Airway Mallampati: II  TM Distance: >3 FB Neck ROM: Full    Dental no notable dental hx.    Pulmonary neg pulmonary ROS, Current Smoker and Patient abstained from smoking.   Pulmonary exam normal breath sounds clear to auscultation       Cardiovascular negative cardio ROS Normal cardiovascular exam Rhythm:Regular Rate:Normal     Neuro/Psych negative neurological ROS  negative psych ROS   GI/Hepatic negative GI ROS, Neg liver ROS,,,  Endo/Other  negative endocrine ROS    Renal/GU negative Renal ROS  negative genitourinary   Musculoskeletal negative musculoskeletal ROS (+)    Abdominal   Peds negative pediatric ROS (+)  Hematology negative hematology ROS (+)   Anesthesia Other Findings   Reproductive/Obstetrics negative OB ROS                             Anesthesia Physical Anesthesia Plan  ASA: 2  Anesthesia Plan: MAC   Post-op Pain Management: Minimal or no pain anticipated   Induction: Intravenous  PONV Risk Score and Plan: 0 and Treatment may vary due to age or medical condition  Airway Management Planned: Nasal Cannula  Additional Equipment:   Intra-op Plan:   Post-operative Plan:   Informed Consent: I have reviewed the patients History and Physical, chart, labs and discussed the procedure including the risks, benefits and alternatives for the proposed anesthesia with the patient or authorized representative who has indicated his/her understanding and acceptance.     Dental advisory given  Plan Discussed with: CRNA  Anesthesia Plan Comments:        Anesthesia Quick Evaluation

## 2022-11-17 NOTE — Op Note (Signed)
[facility] Patient Name: Richard Bowman Procedure Date : 11/17/2022 MRN: [id number] Attending MD: Mauri Pole , MD, [id number] Date of Birth: [birth year] CSN: [id number] Age: 47 Admit Type: Inpatient Procedure:                Colonoscopy Indications:              Suspected colorectal cancer, Abnormal CT of the GI                            tract Providers:                Mauri Pole, MD, Helane Rima, RN,                            Luan Moore, Technician, Tressia Miners, CRNA Referring MD:              Medicines:                Monitored Anesthesia Care Complications:            No immediate complications. Estimated Blood Loss:     Estimated blood loss was minimal. Procedure:                Pre-Anesthesia Assessment:                           - Prior to the procedure, a History and Physical                            was performed, and patient medications and                            allergies were reviewed. The patient's tolerance of                            previous anesthesia was also reviewed. The risks                            and benefits of the procedure and the sedation                            options and risks were discussed with the patient.                            All questions were answered, and informed consent                            was obtained. Prior Anticoagulants: The patient has                            taken no anticoagulant or antiplatelet agents. ASA                            Grade Assessment: II - A patient with mild systemic  disease. After reviewing the risks and benefits,                            the patient was deemed in satisfactory condition to                            undergo the procedure.                           After obtaining informed consent, the colonoscope                            was passed under direct vision. Throughout the                             procedure, the patient's blood pressure, pulse, and                            oxygen saturations were monitored continuously. The                            [id number] Olympus peds colonoscope was                            introduced through the anus and advanced to the the                            cecum, identified by appendiceal orifice and                            ileocecal valve. The colonoscopy was technically                            difficult and complex due to a partially                            obstructing mass. The patient tolerated the                            procedure well. Scope In: 9:13:57 AM Scope Out: 9:22:52 AM Scope Withdrawal Time: 0 hours 0 minutes 1 second  Total Procedure Duration: 0 hours 8 minutes 55 seconds  Findings:      The perianal and digital rectal examinations were normal.      A malignant-appearing, intrinsic severe stenosis was found in the       sigmoid colon and was non-traversed.      An infiltrative near obstructing large friable mass was found in the       sigmoid colon. The mass was circumferential. No bleeding was present.       Biopsies were taken with a cold forceps for histology. Distal fold area,       25cm from anal verge was tattooed with an injection of 5 mL of Spot       (carbon black).      Non-bleeding internal hemorrhoids were found during retroflexion. The  hemorrhoids were small. Impression:               - Stricture in the sigmoid colon.                           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 Biopsied. Tattooed.                           - Non-bleeding internal hemorrhoids. Recommendation:           - Clear liquid diet.                           - Continue present medications.                           - Await pathology results.                           - Repeat colonoscopy in 1 year because the                            examination was  incomplete.                           - Please consult colo-rectal surgery for resection                            of the lesion, patient is at risk for complete                            colonic obstruction Procedure Code(s):        --- Professional ---                           [phone], Colonoscopy, flexible; with biopsy, single                            or multiple                           [id number], Colonoscopy, flexible; with directed                            submucosal injection(s), any substance Diagnosis Code(s):        --- Professional ---                           [id number], Other intestinal obstruction unspecified                            as to partial versus complete obstruction                           C18.7, Malignant neoplasm of sigmoid colon  K56.690, Other partial intestinal obstruction                           K64.8, Other hemorrhoids                           R93.3, Abnormal findings on diagnostic imaging of                            other parts of digestive tract CPT copyright 2022 American Medical Association. All rights reserved. The codes documented in this report are preliminary and upon coder review may  be revised to meet current compliance requirements. Mauri Pole, MD 11/17/2022 9:38:53 AM This report has been signed electronically. Number of Addenda: 0

## 2022-11-17 NOTE — Consult Note (Addendum)
[facility] Surgery Consult Note  [facility] 1975/12/12  [id number].    Requesting MD: Harl Bowie Chief Complaint/Reason for Consult: sigmoid mass  HPI:  Richard Bowman is a 47 y.o. male who presented to the ED yesterday complaining of 3-4 months of generalized abdominal pain and intermittent bright red blood per rectum. He also reports associated constipation for which he was taking OTC laxatives as needed. Denies nausea or vomiting but has had a decreased appetite for several weeks. He also reports a 20lb weight loss over the last 2 months.  CT a/p shows numerous large enhancing masses within the liver most compatible with metastases; primary source not clearly visualized but there is abnormal soft tissue in the mesentery leading to the sigmoid colon which is concerning for adenopathy. CT chest with no definite evidence of metastatic disease within the chest. CEA 140. GI was consulted and took the patient for colonoscopy today which revealed a malignant- appearing, intrinsic severe stenosis in the sigmoid colon that was non- traversed; an infiltrative near obstructing large friable mass was found in the sigmoid colon, the mass was circumferential, no bleeding was present, biopsies were taken, distal fold area 25cm from anal verge was tattooed. General surgery asked to see.  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 No pertinent surgical history.  Social History:  reports that he has been smoking cigarettes. He has a 26.00 pack-year smoking history. He has never used smokeless tobacco. He reports  current alcohol use. He reports that he does not use drugs.  Allergies: No Known Allergies  No medications prior to admission.    Prior to Admission medications   Medication Sig Start Date End Date Taking? Authorizing Provider  nicotine (NICODERM CQ - DOSED IN MG/24 HOURS) 21 mg/24hr patch Place 1 patch (21 mg total) onto the skin daily. 11/17/22 11/17/23 Yes Gherghe, Vella Redhead, MD    Blood pressure 120/80, pulse 76, temperature 98 F (36.7 C), resp. rate 17, height 5\' 10"  (1.778 m), weight 91.6 kg, SpO2 96 %. Physical Exam: General: pleasant, WD/WN male who is laying in bed in NAD HEENT: head is normocephalic, atraumatic.  Sclera are noninjected.  Pupils equal and round.  Ears and nose without any masses or lesions.  Mouth is pink and moist. Dentition fair Heart: regular, rate, and rhythm  Lungs: CTAB, no wheezes, rhonchi, or rales noted.  Respiratory effort nonlabored on room air Abd: soft, very mild diffuse tenderness without rebound or guarding, +BS, no masses, hernias, or organomegaly MS: no BUE/BLE edema, calves soft and nontender Skin: warm and dry with no masses, lesions, or rashes Psych: A&Ox4 with an appropriate affect Neuro: MAEs, no gross motor or sensory deficits BUE/BLE  Results for orders placed or performed during the hospital encounter of 11/15/22 (from the past 48 hour(s))  Lipase, blood     Status: None   Collection Time: 11/15/22 10:08 PM  Result Value Ref Range   Lipase 35 11 - 51 U/L    Comment: Performed at [facility], [address] [place], [place] [address]  Comprehensive  metabolic panel     Status: Abnormal   Collection Time: 11/15/22 10:08 PM  Result Value Ref Range   Sodium 135 135 - 145 mmol/L   Potassium 3.9 3.5 - 5.1 mmol/L   Chloride 99 98 - 111 mmol/L   CO2 22 22 - 32 mmol/L   Glucose, Bld 151 (H) 70 - 99 mg/dL    Comment: Glucose reference range applies only to samples taken after fasting for at least 8 hours.   BUN 10 6 - 20 mg/dL    Creatinine, Ser 1.05 0.61 - 1.24 mg/dL   Calcium 9.2 8.9 - 10.3 mg/dL   Total Protein 6.9 6.5 - 8.1 g/dL   Albumin 3.4 (L) 3.5 - 5.0 g/dL   AST 107 (H) 15 - 41 U/L   ALT 83 (H) 0 - 44 U/L   Alkaline Phosphatase 296 (H) 38 - 126 U/L   Total Bilirubin 0.6 0.3 - 1.2 mg/dL   GFR, Estimated >60 >60 mL/min    Comment: (NOTE) Calculated using the CKD-EPI Creatinine Equation (2021)    Anion gap 14 5 - 15    Comment: Performed at [facility], [place] [address] [place], [place] [address]  CBC     Status: Abnormal   Collection Time: 11/15/22 10:08 PM  Result Value Ref Range   WBC 12.6 (H) 4.0 - 10.5 K/uL   RBC 4.40 4.22 - 5.81 MIL/uL   Hemoglobin 13.2 13.0 - 17.0 g/dL   HCT 39.0 39.0 - 52.0 %   MCV 88.6 80.0 - 100.0 fL   MCH 30.0 26.0 - 34.0 pg   MCHC 33.8 30.0 - 36.0 g/dL   RDW 12.3 11.5 - 15.5 %   Platelets 285 150 - 400 K/uL   nRBC 0.0 0.0 - 0.2 %    Comment: Performed at [facility], [place] [address], [place], [place] [address]  Urinalysis, Routine w reflex microscopic -Urine, Clean Catch     Status: None   Collection Time: 11/15/22 10:09 PM  Result Value Ref Range   Color, Urine YELLOW YELLOW   APPearance CLEAR CLEAR   Specific Gravity, Urine 1.016 1.005 - 1.030   pH 5.0 5.0 - 8.0   Glucose, UA NEGATIVE NEGATIVE mg/dL   Hgb urine dipstick NEGATIVE NEGATIVE   Bilirubin Urine NEGATIVE NEGATIVE   Ketones, ur NEGATIVE NEGATIVE mg/dL   Protein, ur NEGATIVE NEGATIVE mg/dL   Nitrite NEGATIVE NEGATIVE   Leukocytes,Ua NEGATIVE NEGATIVE    Comment: Performed at [facility] [address] [place], [place] [address]  Hepatitis panel, acute     Status: None   Collection Time: 11/16/22  9:18 AM  Result Value Ref Range   Hepatitis B Surface Ag NON REACTIVE NON REACTIVE   HCV Ab NON REACTIVE NON REACTIVE    Comment: (NOTE) Nonreactive HCV antibody screen is consistent with no HCV infections,  unless recent infection is suspected or other evidence exists to indicate HCV  infection.     Hep A IgM NON REACTIVE NON REACTIVE   Hep B C IgM NON REACTIVE NON REACTIVE    Comment: Performed at [facility], [place] [address] [place], [place] [address]  HIV Antibody (routine testing w rflx)     Status: None   Collection Time: 11/16/22 10:15 AM  Result Value Ref Range   HIV Screen 4th Generation wRfx Non Reactive Non Reactive    Comment: Performed at [facility], [place],  [place] [address]  CEA     Status: Abnormal   Collection Time: 11/16/22 12:56 PM  Result Value Ref Range   CEA 140.0 (H) 0.0 - 4.7 ng/mL    Comment: (NOTE)                             Nonsmokers          <3.9                             Smokers             <5.6 Roche [employer]ostics Electrochemiluminescence Immunoassay (ECLIA) Values obtained with different assay methods or kits cannot be used interchangeably.  Results cannot be interpreted as absolute evidence of the presence or absence of malignant disease. Performed At: [facility] [place], [place] [id number] Rush Farmer MD [id number]   Protime-INR     Status: None   Collection Time: 11/16/22 12:56 PM  Result Value Ref Range   Prothrombin Time 14.2 11.4 - 15.2 seconds   INR 1.1 0.8 - 1.2    Comment: (NOTE) INR goal varies based on device and disease states. Performed at [facility], [place] [address], [place], [place] [address]   Lactate dehydrogenase     Status: Abnormal   Collection Time: 11/16/22  4:49 PM  Result Value Ref Range   LDH 920 (H) 98 - 192 U/L    Comment: Performed at [facility], [place] [address] [place], [place] [address]   CT CHEST W CONTRAST  Result Date: 11/16/2022 CLINICAL DATA:  New diagnosis of stage IV colon cancer. Assess for thoracic disease. EXAM: CT CHEST WITH CONTRAST TECHNIQUE: Multidetector CT imaging of the chest was performed during intravenous contrast administration. RADIATION DOSE REDUCTION: This exam was performed according to  the departmental dose-optimization program which includes automated exposure control, adjustment of the mA and/or kV according to patient size and/or use of iterative reconstruction technique. CONTRAST:  74mL OMNIPAQUE IOHEXOL 350 MG/ML SOLN COMPARISON:  None Available. FINDINGS: Cardiovascular: No significant vascular findings. Normal heart size. No pericardial effusion. Mediastinum/Nodes: No enlarged mediastinal, hilar, or axillary lymph nodes. Thyroid gland, trachea, and esophagus demonstrate no significant findings. Lungs/Pleura: Mean 7 mm subpleural pulmonary nodule is seen within the basilar right middle lobe at axial image # 108/7. Similar 3 mm subpleural pulmonary nodule noted within the right lower lobe at axial image # 103/7. While not completely excluded, these are considered of very low likelihood to represent pulmonary metastatic disease. No additional focal pulmonary nodules or infiltrates. No pneumothorax or pleural effusion. Central airways are widely patent. Upper Abdomen: Multiple hypoattenuating masses are again identified throughout the liver, better seen on previously performed CT examination of the abdomen pelvis in keeping with bilobar hepatic metastatic disease. Musculoskeletal: No acute bone abnormality. No lytic or blastic bone lesion. IMPRESSION: 1. No definite evidence of metastatic disease within the chest. 2. Two subpleural pulmonary nodules within the right lung, considered of very low likelihood to represent pulmonary metastatic disease. Continued observation on subsequent surveillance CT would be warranted. 3. Bilobar hepatic metastatic disease, better seen on previously performed CT examination of the abdomen pelvis. Electronically Signed   By: Fidela Salisbury M.D.   On: 11/16/2022 23:25   CT ABDOMEN PELVIS W CONTRAST  Result Date: 11/16/2022 CLINICAL DATA:  Abdominal pain EXAM: CT ABDOMEN AND PELVIS WITH CONTRAST TECHNIQUE:  Multidetector CT imaging of the abdomen and pelvis was  performed using the standard protocol following bolus administration of intravenous contrast. RADIATION DOSE REDUCTION: This exam was performed according to the departmental dose-optimization program which includes automated exposure control, adjustment of the mA and/or kV according to patient size and/or use of iterative reconstruction technique. CONTRAST:  84mL OMNIPAQUE IOHEXOL 350 MG/ML SOLN COMPARISON:  None Available. FINDINGS: Lower chest: No acute abnormality Hepatobiliary: Numerous large masses throughout the liver compatible with metastases. Index left hepatic mass on image 22 measures up to 7.6 cm. Index right hepatic mass on image 35 measures up to 8.6 cm. Gallbladder unremarkable. Pancreas: No focal abnormality or ductal dilatation. Spleen: No focal abnormality.  Normal size. Adrenals/Urinary Tract: No adrenal abnormality. No focal renal abnormality. No stones or hydronephrosis. Urinary bladder is unremarkable. Stomach/Bowel: No visible abnormality within the colon. However, there is ill-defined spiculated mass within the mesentery adjacent to the sigmoid colon measuring 2.9 x 2.1 cm. Given this finding and the masses within the liver, primary colon cancer is a concern. Stomach and small bowel decompressed, grossly unremarkable. Vascular/Lymphatic: Scattered aortic calcifications. No aneurysm. No retroperitoneal or inguinal adenopathy. Reproductive: No visible focal abnormality. Other: No free fluid or free air. Musculoskeletal: No acute bony abnormality or focal bone lesion. IMPRESSION: Numerous large enhancing masses within the liver most compatible with metastases. Primary source not clearly visualized. However, there is abnormal soft tissue in the mesentery leading to the sigmoid colon which is concerning for adenopathy, possibly related to sigmoid colon malignancy although not visualized by CT. Consider further evaluation with colonoscopy. Electronically Signed   By: Rolm Baptise M.D.   On:  11/16/2022 02:21    Anti-infectives (From admission, onward)    None        Assessment/Plan Near obstructing sigmoid mass Multiple liver lesions - CT a/p shows numerous large enhancing masses within the liver most compatible with metastases; primary source not clearly visualized but there is abnormal soft tissue in the mesentery leading to the sigmoid colon which is concerning for adenopathy - CT chest with no definite evidence of metastatic disease within the chest - CEA 140 - Colonoscopy today revealed a malignant- appearing, intrinsic severe stenosis in the sigmoid colon that was non- traversed; an infiltrative near obstructing large friable mass was found in the sigmoid colon, the mass was circumferential, no bleeding was present, biopsies were taken, distal fold area 25cm from anal verge was tattooed. 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 Recommend resection this admission. Patient is agreeable. Will tentatively plan for surgery tomorrow pending OR availability (open sigmoid colectomy, possible colostomy, possible liver biopsy). He completed a bowel prep prior to colonoscopy so we will keep him on clear liquids today. NPO after midnight.   ID - none VTE - SCDs, per primary FEN - IVF, CLD, Boost breeze, NPO after midnight Foley - none  Tobacco abuse  I reviewed Consultant gastroenterology notes, hospitalist notes, last 24 h vitals and pain scores, last 48 h intake and output, last 24 h labs and trends, and last 24 h imaging results.   Wellington Hampshire, [facility] 11/17/2022, 10:26 AM Please see Amion for pager number during day hours 7:00am-4:30pm

## 2022-11-17 NOTE — Anesthesia Postprocedure Evaluation (Signed)
Anesthesia Post Note  Patient: MARVELL TOLLEFSON  Procedure(s) Performed: COLONOSCOPY WITH PROPOFOL ESOPHAGOGASTRODUODENOSCOPY (EGD) WITH PROPOFOL BIOPSY SUBMUCOSAL TATTOO INJECTION     Patient location during evaluation: PACU Anesthesia Type: MAC Level of consciousness: awake and alert Pain management: pain level controlled Vital Signs Assessment: post-procedure vital signs reviewed and stable Respiratory status: spontaneous breathing, nonlabored ventilation and respiratory function stable Cardiovascular status: blood pressure returned to baseline and stable Postop Assessment: no apparent nausea or vomiting Anesthetic complications: no   No notable events documented.  Last Vitals:  Vitals:   11/17/22 1105 11/17/22 1121  BP: 136/76 134/83  Pulse: 66 64  Resp:    Temp: 36.8 C 36.7 C  SpO2:  94%    Last Pain:  Vitals:   11/17/22 1121  TempSrc: Oral  PainSc:                  Lynda Rainwater

## 2022-11-17 NOTE — Progress Notes (Signed)
Patient was complaining severe pain , rate 10/10 at 1600 PM. Vitals signs checked. HR and RR got red mews. Charge nurse made aware. Made MD aware. Red mews protocol implemented. Pain medication given. Patient was kept under observation.

## 2022-11-17 NOTE — Progress Notes (Signed)
Pharmacy Antibiotic Note  Richard Bowman is a 47 y.o. male admitted on 11/15/2022 with  intra-abd infection .  Pharmacy has been consulted for zosyn dosing.  Pt admitted with metastatic colon CA. Plan for surgery in AM. Febrile this evening. Empiric zosyn.  Scr 1  Plan: Zosyn 3.375g IV q8  Height: 5\' 10"  (177.8 cm) Weight: 91.6 kg (202 lb) IBW/kg (Calculated) : 73  Temp (24hrs), Avg:98.7 F (37.1 C), Min:98 F (36.7 C), Max:102.1 F (38.9 C)  Recent Labs  Lab 11/15/22 2208  WBC 12.6*  CREATININE 1.05    Estimated Creatinine Clearance: 102.1 mL/min (by C-G formula based on SCr of 1.05 mg/dL).    No Known Allergies  Antimicrobials this admission: 3/19 zosyn>>  Dose adjustments this admission:   Microbiology results:    Onnie Boer, PharmD, BCIDP, AAHIVP, CPP Infectious Disease Pharmacist 11/17/2022 7:11 PM

## 2022-11-17 NOTE — Consult Note (Signed)
[facility] Nurse requested for preoperative stoma site marking  Discussed surgical procedure and stoma creation with patient, he reports new onset of pain when I arrived and received Morphine just as I started my conversation with the pateint.   Explained role of the [facility] nurse team.  Provided the patient with educational booklet and provided samples of pouching options, patient declines viewing the ostomy pouch examples. Patient is not engaged in discussion about surgery and ostomy creation understandably.   Examined patient sitting and standing in order to place the marking in the patient's visual field, away from any creases or abdominal contour issues and within the rectus muscle.   Marked for colostomy in the LLQ  __3__ cm to the left of the umbilicus and [id number] below the umbilicus.  Marked for ileostomy in the RLQ  _4___cm to the right of the umbilicus and  [id number] cm below the umbilicus.  Patient's abdomen cleansed with CHG wipes at site markings, allowed to air dry prior to marking.Covered mark with thin film transparent dressing to preserve mark until date of surgery.   [facility] Nurse team will follow up with patient after surgery for continue ostomy care and teaching.  [place] MSN, [place], [place], [place]

## 2022-11-17 NOTE — Progress Notes (Signed)
Incentive spirometry was introduced with teach back. 

## 2022-11-17 NOTE — Interval H&P Note (Signed)
History and Physical Interval Note:  11/17/2022 7:41 AM  Richard Bowman  has presented today for surgery, with the diagnosis of liver mets/ ? colon primary.  The various methods of treatment have been discussed with the patient and family. After consideration of risks, benefits and other options for treatment, the patient has consented to  Procedure(s): COLONOSCOPY WITH PROPOFOL (N/A) ESOPHAGOGASTRODUODENOSCOPY (EGD) WITH PROPOFOL (N/A) as a surgical intervention.  The patient's history has been reviewed, patient examined, no change in status, stable for surgery.  I have reviewed the patient's chart and labs.  Questions were answered to the patient's satisfaction.     Myrah Strawderman

## 2022-11-17 NOTE — Op Note (Signed)
[facility] Patient Name: Richard Bowman Procedure Date : 11/17/2022 MRN: [id number] Attending MD: Mauri Pole , MD, [id number] Date of Birth: 1976/05/11 CSN: [id number] Age: 47 Admit Type: Inpatient Procedure:                Upper GI endoscopy Indications:              Suspected tumor of the GI tract, Abnormal CT of the                            GI tract Providers:                Mauri Pole, MD, Helane Rima, RN,                            Luan Moore, Technician, Tressia Miners, CRNA Referring MD:              Medicines:                Monitored Anesthesia Care Complications:            No immediate complications. Estimated Blood Loss:     Estimated blood loss: none. Procedure:                Pre-Anesthesia Assessment:                           - Prior to the procedure, a History and Physical                            was performed, and patient medications and                            allergies were reviewed. The patient's tolerance of                            previous anesthesia was also reviewed. The risks                            and benefits of the procedure and the sedation                            options and risks were discussed with the patient.                            All questions were answered, and informed consent                            was obtained. Prior Anticoagulants: The patient has                            taken no anticoagulant or antiplatelet agents. ASA                            Grade Assessment: II - A patient with mild systemic  disease. After reviewing the risks and benefits,                            the patient was deemed in satisfactory condition to                            undergo the procedure.                           After obtaining informed consent, the endoscope was                            passed under direct vision. Throughout the                             procedure, the patient's blood pressure, pulse, and                            oxygen saturations were monitored continuously. The                            [id number] Olympus endoscope was introduced                            through the mouth, and advanced to the second part                            of duodenum. The upper GI endoscopy was                            accomplished without difficulty. The patient                            tolerated the procedure well. Scope In: Scope Out: Findings:      The Z-line was regular and was found 38 cm from the incisors.      The esophagus was normal.      Patchy minimal inflammation characterized by congestion (edema) and       erythema was found in the gastric antrum.      The exam of the stomach was otherwise normal.      The cardia and gastric fundus were normal on retroflexion.      The examined duodenum was normal. Impression:               - Z-line regular, 38 cm from the incisors.                           - Normal esophagus.                           - Gastritis.                           - Normal examined duodenum.                           -  No specimens collected. Recommendation:           - Patient has a contact number available for                            emergencies. The signs and symptoms of potential                            delayed complications were discussed with the                            patient. Return to normal activities tomorrow.                            Written discharge instructions were provided to the                            patient.                           - Resume previous diet.                           - Continue present medications.                           - See the other procedure note for documentation of                            additional recommendations. Procedure Code(s):        --- Professional ---                           [phone], Esophagogastroduodenoscopy, flexible,                             transoral; diagnostic, including collection of                            specimen(s) by brushing or washing, when performed                            (separate procedure) Diagnosis Code(s):        --- Professional ---                           K29.70, Gastritis, unspecified, without bleeding                           R93.3, Abnormal findings on diagnostic imaging of                            other parts of digestive tract CPT copyright 2022 American [employer] Association. All rights reserved. The codes documented in this report are preliminary and upon coder review may  be revised to meet current compliance requirements. Mauri Pole, MD 11/17/2022 9:47:46 AM This report has been signed electronically. Number of Addenda: 0

## 2022-11-17 NOTE — Progress Notes (Signed)
Patient is back on the floor at 1000 AM after colonoscopy and Endoscopy. Vital signs are stable. Alert and oriented

## 2022-11-17 NOTE — Progress Notes (Signed)
PROGRESS NOTE  Richard Bowman [id number] DOB: 1976-06-05 DOA: 11/15/2022 PCP: [name], PA-C   LOS: 1 day   Brief Narrative / Interim history: 47 year old male with history of esophageal ulcer but no other medical issues comes to the hospital with rectal bleeding.  This started about 2 to 3 days ago.  He reports about a 20 pound weight loss over the last several months, and also significant constipation.  Otherwise he is healthy, active, and has no known medical problems.  Imaging in the ED was concerning for liver mets and possible sigmoid mass.  GI and oncology were consulted  Subjective / 24h Interval events: He is doing well this morning, complains of abdominal discomfort.  He is awaiting endoscopy and colonoscopy.  He denies any chest pain, denies any shortness of breath or palpitations.  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  GI consulted, patient was taken to the endoscopy suite this morning, underwent EGD which was unremarkable except for gastritis, however on colonoscopy a malignant appearing intrinsic severe stenosis due to an infiltrative near obstructive large friable mass was found in the sigmoid colon.  General surgery was consulted as well given endoscopy findings -Oncology consulted, underwent a CT of the chest without definitive evidence of metastatic disease  Active problems Tobacco use -counseled for cessation on admission.  Placed on nicotine patch  Lung nodules -CT scan showed 2 subpleural pulmonary nodules in the right lung, with low likelihood represent metastatic disease however he will need ongoing surveillance CT as an outpatient as he is a smoker  Scheduled Meds:  HYDROmorphone       pantoprazole  40 mg Oral Daily   Continuous Infusions:  lactated ringers 200 mL/hr at 11/17/22 0928   PRN  Meds:.[MAR Hold] acetaminophen **OR** [MAR Hold] acetaminophen, [MAR Hold] hydrALAZINE, HYDROmorphone, HYDROmorphone (DILAUDID) injection, [MAR Hold]  morphine injection, [MAR Hold] ondansetron **OR** [MAR Hold] ondansetron (ZOFRAN) IV, oxyCODONE **OR** oxyCODONE, promethazine  Current Outpatient Medications  Medication Instructions   nicotine (NICODERM CQ - DOSED IN MG/24 HOURS) 21 mg, Transdermal, Every 24 hours    Diet Orders (From admission, onward)     Start     Ordered   11/17/22 0001  Diet NPO time specified Except for: Sips with Meds  Diet effective midnight       Question:  Except for  Answer:  Sips with Meds   11/16/22 1528            DVT prophylaxis: SCDs Start: 11/16/22 0959   Lab Results  Component Value Date   PLT 285 11/15/2022      Code Status: Full Code  Family Communication: No family present at bedside  Status is: Inpatient  Remains inpatient appropriate because: surgery consult, near obstructive sigmoid mass  Level of care: Med-Surg  Consultants:  GI General surgery  Objective: Vitals:   11/17/22 0826 11/17/22 0932 11/17/22 0945 11/17/22 1000  BP: 133/81 111/78 118/74 120/80  Pulse:  78 83 76  Resp: 11 (!) 21 (!) 24 17  Temp: 98 F (36.7 C)   98 F (36.7 C)  TempSrc: Tympanic     SpO2: 95% 98% 99% 96%  Weight:      Height:        Intake/Output Summary (Last 24 hours) at 11/17/2022 1003 Last data filed at 11/16/2022 1653 Gross per 24 hour  Intake 898.64 ml  Output --  Net 898.64 ml   Wt Readings from Last 3 Encounters:  11/15/22 91.6 kg  10/28/20 101.2 kg  10/03/20 98.4 kg    Examination:  Constitutional: NAD Eyes: no scleral icterus ENMT: Mucous membranes are moist.  Neck: normal, supple Respiratory: clear to auscultation bilaterally, no wheezing, no crackles. Normal respiratory effort. No accessory muscle use.  Cardiovascular: Regular rate and rhythm, no murmurs / rubs / gallops. No LE edema.  Abdomen: non distended,  mild tenderness throughout.  Bowel sounds positive..  Musculoskeletal: no clubbing / cyanosis.    Data Reviewed: I have independently reviewed following labs and imaging studies   CBC Recent Labs  Lab 11/15/22 2208  WBC 12.6*  HGB 13.2  HCT 39.0  PLT 285  MCV 88.6  MCH 30.0  MCHC 33.8  RDW 12.3    Recent Labs  Lab 11/15/22 2208 11/16/22 1256  NA 135  --   K 3.9  --   CL 99  --   CO2 22  --   GLUCOSE 151*  --   BUN 10  --   CREATININE 1.05  --   CALCIUM 9.2  --   AST 107*  --   ALT 83*  --   ALKPHOS 296*  --   BILITOT 0.6  --   ALBUMIN 3.4*  --   INR  --  1.1    ------------------------------------------------------------------------------------------------------------------ No results for input(s): "CHOL", "HDL", "LDLCALC", "TRIG", "CHOLHDL", "LDLDIRECT" in the last 72 hours.  Lab Results  Component Value Date   HGBA1C 5.6 06/12/2014   ------------------------------------------------------------------------------------------------------------------ No results for input(s): "TSH", "T4TOTAL", "T3FREE", "THYROIDAB" in the last 72 hours.  Invalid input(s): "FREET3"  Cardiac Enzymes No results for input(s): "CKMB", "TROPONINI", "MYOGLOBIN" in the last 168 hours.  Invalid input(s): "CK" ------------------------------------------------------------------------------------------------------------------ No results found for: "BNP"  CBG: No results for input(s): "GLUCAP" in the last 168 hours.  No results found for this or any previous visit (from the past 240 hour(s)).   Radiology Studies: CT CHEST W CONTRAST  Result Date: 11/16/2022 CLINICAL DATA:  New diagnosis of stage IV colon cancer. Assess for thoracic disease. EXAM: CT CHEST WITH CONTRAST TECHNIQUE: Multidetector CT imaging of the chest was performed during intravenous contrast administration. RADIATION DOSE REDUCTION: This exam was performed according to the departmental dose-optimization program which  includes automated exposure control, adjustment of the mA and/or kV according to patient size and/or use of iterative reconstruction technique. CONTRAST:  33mL OMNIPAQUE IOHEXOL 350 MG/ML SOLN COMPARISON:  None Available. FINDINGS: Cardiovascular: No significant vascular findings. Normal heart size. No pericardial effusion. Mediastinum/Nodes: No enlarged mediastinal, hilar, or axillary lymph nodes. Thyroid gland, trachea, and esophagus demonstrate no significant findings. Lungs/Pleura: Mean 7 mm subpleural pulmonary nodule is seen within the basilar right middle lobe at axial image # 108/7. Similar 3 mm subpleural pulmonary nodule noted within the right lower lobe at axial image # 103/7. While not completely excluded, these are considered of very low likelihood to represent pulmonary metastatic disease. No additional focal pulmonary nodules or infiltrates. No pneumothorax or pleural effusion. Central airways are widely patent. Upper Abdomen: Multiple hypoattenuating masses are again identified throughout the liver, better seen on previously performed CT examination of the abdomen pelvis in keeping with bilobar hepatic metastatic disease. Musculoskeletal: No acute bone abnormality. No lytic or blastic bone lesion. IMPRESSION: 1. No definite evidence of metastatic disease within the chest. 2. Two subpleural pulmonary nodules within the right lung, considered of very low likelihood to represent pulmonary metastatic  disease. Continued observation on subsequent surveillance CT would be warranted. 3. Bilobar hepatic metastatic disease, better seen on previously performed CT examination of the abdomen pelvis. Electronically Signed   By: Fidela Salisbury M.D.   On: 11/16/2022 23:25     Marzetta Board, MD, PhD [facility]  Between 7 am - 7 pm I am available, please contact me via Amion (for emergencies) or Securechat (non urgent messages)  Between 7 pm - 7 am I am not available, please contact night coverage  MD/APP via Amion

## 2022-11-17 NOTE — Transfer of Care (Signed)
Immediate Anesthesia Transfer of Care Note  Patient: RAYNALDO WO  Procedure(s) Performed: COLONOSCOPY WITH PROPOFOL ESOPHAGOGASTRODUODENOSCOPY (EGD) WITH PROPOFOL BIOPSY SUBMUCOSAL TATTOO INJECTION  Patient Location: PACU  Anesthesia Type:MAC  Level of Consciousness: awake, patient cooperative, and responds to stimulation  Airway & Oxygen Therapy: Patient Spontanous Breathing and Patient connected to nasal cannula oxygen  Post-op Assessment: Report given to RN and Post -op Vital signs reviewed and stable  Post vital signs: Reviewed and stable  Last Vitals:  Vitals Value Taken Time  BP 111/78 11/17/22 0932  Temp    Pulse 83 11/17/22 0935  Resp 16 11/17/22 0935  SpO2 96 % 11/17/22 0935  Vitals shown include unvalidated device data.  Last Pain:  Vitals:   11/17/22 0826  TempSrc: Tympanic  PainSc: 10-Worst pain ever      Patients Stated Pain Goal: 4 ([id number])  Complications: No notable events documented.

## 2022-11-18 ENCOUNTER — Other Ambulatory Visit: Payer: Self-pay

## 2022-11-18 ENCOUNTER — Encounter (HOSPITAL_COMMUNITY): Payer: Self-pay | Admitting: Internal Medicine

## 2022-11-18 ENCOUNTER — Encounter (HOSPITAL_COMMUNITY)
Admission: EM | Disposition: A | Discharge status: Home / Self Care | Payer: Self-pay | Source: Home / Self Care | Attending: Internal Medicine

## 2022-11-18 ENCOUNTER — Inpatient Hospital Stay (HOSPITAL_COMMUNITY): Payer: Medicaid Other | Admitting: Certified Registered Nurse Anesthetist

## 2022-11-18 DIAGNOSIS — C787 Secondary malignant neoplasm of liver and intrahepatic bile duct: Secondary | ICD-10-CM

## 2022-11-18 DIAGNOSIS — K56699 Other intestinal obstruction unspecified as to partial versus complete obstruction: Secondary | ICD-10-CM

## 2022-11-18 DIAGNOSIS — R1084 Generalized abdominal pain: Secondary | ICD-10-CM

## 2022-11-18 DIAGNOSIS — C801 Malignant (primary) neoplasm, unspecified: Secondary | ICD-10-CM

## 2022-11-18 DIAGNOSIS — K6389 Other specified diseases of intestine: Secondary | ICD-10-CM

## 2022-11-18 HISTORY — PX: COLON RESECTION SIGMOID: SHX6737

## 2022-11-18 HISTORY — PX: LIVER BIOPSY: SHX301

## 2022-11-18 LAB — CBC
HCT: 35.7 % — ABNORMAL LOW (ref 39.0–52.0)
Hemoglobin: 12 g/dL — ABNORMAL LOW (ref 13.0–17.0)
MCH: 29.6 pg (ref 26.0–34.0)
MCHC: 33.6 g/dL (ref 30.0–36.0)
MCV: 88.1 fL (ref 80.0–100.0)
Platelets: 275 10*3/uL (ref 150–400)
RBC: 4.05 MIL/uL — ABNORMAL LOW (ref 4.22–5.81)
RDW: 12.4 % (ref 11.5–15.5)
WBC: 12.1 10*3/uL — ABNORMAL HIGH (ref 4.0–10.5)
nRBC: 0 % (ref 0.0–0.2)

## 2022-11-18 LAB — SURGICAL PATHOLOGY

## 2022-11-18 LAB — COMPREHENSIVE METABOLIC PANEL
ALT: 52 U/L — ABNORMAL HIGH (ref 0–44)
AST: 80 U/L — ABNORMAL HIGH (ref 15–41)
Albumin: 2.9 g/dL — ABNORMAL LOW (ref 3.5–5.0)
Alkaline Phosphatase: 246 U/L — ABNORMAL HIGH (ref 38–126)
Anion gap: 9 (ref 5–15)
BUN: 12 mg/dL (ref 6–20)
CO2: 27 mmol/L (ref 22–32)
Calcium: 8.9 mg/dL (ref 8.9–10.3)
Chloride: 99 mmol/L (ref 98–111)
Creatinine, Ser: 1.35 mg/dL — ABNORMAL HIGH (ref 0.61–1.24)
GFR, Estimated: >60 mL/min (ref >60)
Glucose, Bld: 112 mg/dL — ABNORMAL HIGH (ref 70–99)
Potassium: 3.9 mmol/L (ref 3.5–5.1)
Sodium: 135 mmol/L (ref 135–145)
Total Bilirubin: 1.3 mg/dL — ABNORMAL HIGH (ref 0.3–1.2)
Total Protein: 6.3 g/dL — ABNORMAL LOW (ref 6.5–8.1)

## 2022-11-18 LAB — SURGICAL PCR SCREEN
MRSA, PCR: NEGATIVE
Staphylococcus aureus: POSITIVE — AB

## 2022-11-18 LAB — TYPE AND SCREEN
ABO/RH(D): A POS
Antibody Screen: NEGATIVE

## 2022-11-18 LAB — MAGNESIUM: Magnesium: 2 mg/dL (ref 1.7–2.4)

## 2022-11-18 SURGERY — COLECTOMY, SIGMOID, OPEN
Anesthesia: General

## 2022-11-18 MED ORDER — FENTANYL CITRATE (PF) 100 MCG/2ML IJ SOLN
50.0000 ug | INTRAMUSCULAR | Status: AC | PRN
  Administered 2022-11-18 (×2): 50 ug via INTRAVENOUS
  Administered 2022-11-18: 100 ug via INTRAVENOUS
  Administered 2022-11-18: 50 ug via INTRAVENOUS

## 2022-11-18 MED ORDER — ENOXAPARIN SODIUM 40 MG/0.4ML IJ SOSY
40.0000 mg | PREFILLED_SYRINGE | INTRAMUSCULAR | Status: DC
  Administered 2022-11-19 – 2022-11-23 (×5): 40 mg via SUBCUTANEOUS
  Filled 2022-11-18 (×5): qty 0.4

## 2022-11-18 MED ORDER — MIDAZOLAM HCL 2 MG/2ML IJ SOLN
INTRAMUSCULAR | Status: AC
  Filled 2022-11-18: qty 2

## 2022-11-18 MED ORDER — ACETAMINOPHEN 500 MG PO TABS: 1000.0000 mg | ORAL_TABLET | Freq: Four times a day (QID) | ORAL | Status: DC | PRN

## 2022-11-18 MED ORDER — GABAPENTIN 300 MG PO CAPS
ORAL_CAPSULE | ORAL | Status: AC
  Administered 2022-11-18: 300 mg via ORAL
  Filled 2022-11-18: qty 1

## 2022-11-18 MED ORDER — CHLORHEXIDINE GLUCONATE 0.12 % MT SOLN: 15.0000 mL | Freq: Once | OROMUCOSAL | Status: AC

## 2022-11-18 MED ORDER — SODIUM CHLORIDE 0.9% FLUSH: 9.0000 mL | INTRAVENOUS | Status: DC | PRN

## 2022-11-18 MED ORDER — PROPOFOL 10 MG/ML IV BOLUS
INTRAVENOUS | Status: AC
  Filled 2022-11-18: qty 20

## 2022-11-18 MED ORDER — HYDROMORPHONE HCL 1 MG/ML IJ SOLN
INTRAMUSCULAR | Status: DC | PRN
  Administered 2022-11-18: .5 mg via INTRAVENOUS

## 2022-11-18 MED ORDER — ONDANSETRON HCL 4 MG/2ML IJ SOLN
INTRAMUSCULAR | Status: AC
  Filled 2022-11-18: qty 2

## 2022-11-18 MED ORDER — 0.9 % SODIUM CHLORIDE (POUR BTL) OPTIME
TOPICAL | Status: DC | PRN
  Administered 2022-11-18: 3000 mL

## 2022-11-18 MED ORDER — METHOCARBAMOL 500 MG PO TABS
500.0000 mg | ORAL_TABLET | Freq: Four times a day (QID) | ORAL | Status: DC | PRN
  Administered 2022-11-18 – 2022-11-19 (×2): 500 mg via ORAL
  Filled 2022-11-18 (×2): qty 1

## 2022-11-18 MED ORDER — NALOXONE HCL 0.4 MG/ML IJ SOLN: 0.4000 mg | INTRAMUSCULAR | Status: DC | PRN

## 2022-11-18 MED ORDER — FENTANYL CITRATE (PF) 100 MCG/2ML IJ SOLN
INTRAMUSCULAR | Status: AC
  Administered 2022-11-18: 50 ug via INTRAVENOUS
  Filled 2022-11-18: qty 2

## 2022-11-18 MED ORDER — FENTANYL CITRATE (PF) 250 MCG/5ML IJ SOLN
INTRAMUSCULAR | Status: AC
  Filled 2022-11-18: qty 5

## 2022-11-18 MED ORDER — HYDROMORPHONE 1 MG/ML IV SOLN
INTRAVENOUS | Status: DC
  Administered 2022-11-18: 5.7 mg via INTRAVENOUS
  Administered 2022-11-18: 3.1 mg via INTRAVENOUS
  Administered 2022-11-18: 30 mg via INTRAVENOUS
  Administered 2022-11-19: 2.8 mg via INTRAVENOUS
  Administered 2022-11-19: 1.8 mg via INTRAVENOUS
  Administered 2022-11-19: 2.2 mg via INTRAVENOUS
  Administered 2022-11-19: 2.6 mg via INTRAVENOUS
  Administered 2022-11-19: 2 mg via INTRAVENOUS
  Administered 2022-11-20: 1.8 mg via INTRAVENOUS
  Administered 2022-11-20: 1.6 mg via INTRAVENOUS
  Administered 2022-11-20: 1 mg via INTRAVENOUS
  Filled 2022-11-18 (×2): qty 30

## 2022-11-18 MED ORDER — ALBUMIN HUMAN 5 % IV SOLN: INTRAVENOUS | Status: DC | PRN

## 2022-11-18 MED ORDER — HYDROMORPHONE HCL 1 MG/ML IJ SOLN
INTRAMUSCULAR | Status: AC
  Filled 2022-11-18: qty 0.5

## 2022-11-18 MED ORDER — AMISULPRIDE (ANTIEMETIC) 5 MG/2ML IV SOLN: 10.0000 mg | Freq: Once | INTRAVENOUS | Status: DC | PRN

## 2022-11-18 MED ORDER — DIPHENHYDRAMINE HCL 50 MG/ML IJ SOLN: 12.5000 mg | Freq: Four times a day (QID) | INTRAMUSCULAR | Status: DC | PRN

## 2022-11-18 MED ORDER — ONDANSETRON HCL 4 MG/2ML IJ SOLN
INTRAMUSCULAR | Status: DC | PRN
  Administered 2022-11-18: 4 mg via INTRAVENOUS

## 2022-11-18 MED ORDER — PROMETHAZINE HCL 25 MG/ML IJ SOLN: 6.2500 mg | INTRAMUSCULAR | Status: DC | PRN

## 2022-11-18 MED ORDER — MIDAZOLAM HCL 2 MG/2ML IJ SOLN
INTRAMUSCULAR | Status: DC | PRN
  Administered 2022-11-18: 2 mg via INTRAVENOUS

## 2022-11-18 MED ORDER — ORAL CARE MOUTH RINSE: 15.0000 mL | Freq: Once | OROMUCOSAL | Status: AC

## 2022-11-18 MED ORDER — KETAMINE HCL 10 MG/ML IJ SOLN
INTRAMUSCULAR | Status: DC | PRN
  Administered 2022-11-18: 50 mg via INTRAVENOUS

## 2022-11-18 MED ORDER — CHLORHEXIDINE GLUCONATE 0.12 % MT SOLN
OROMUCOSAL | Status: AC
  Administered 2022-11-18: 15 mL via OROMUCOSAL
  Filled 2022-11-18: qty 15

## 2022-11-18 MED ORDER — PROPOFOL 500 MG/50ML IV EMUL
INTRAVENOUS | Status: DC | PRN
  Administered 2022-11-18: 25 ug/kg/min via INTRAVENOUS

## 2022-11-18 MED ORDER — PROPOFOL 1000 MG/100ML IV EMUL
INTRAVENOUS | Status: AC
  Filled 2022-11-18: qty 100

## 2022-11-18 MED ORDER — SUGAMMADEX SODIUM 200 MG/2ML IV SOLN
INTRAVENOUS | Status: DC | PRN
  Administered 2022-11-18: 200 mg via INTRAVENOUS

## 2022-11-18 MED ORDER — ROCURONIUM BROMIDE 10 MG/ML (PF) SYRINGE
PREFILLED_SYRINGE | INTRAVENOUS | Status: DC | PRN
  Administered 2022-11-18: 20 mg via INTRAVENOUS
  Administered 2022-11-18: 70 mg via INTRAVENOUS
  Administered 2022-11-18: 30 mg via INTRAVENOUS

## 2022-11-18 MED ORDER — HYDROMORPHONE HCL 1 MG/ML IJ SOLN
INTRAMUSCULAR | Status: AC
  Filled 2022-11-18: qty 1

## 2022-11-18 MED ORDER — HYDROMORPHONE HCL 1 MG/ML IJ SOLN
1.0000 mg | INTRAMUSCULAR | Status: DC | PRN
  Administered 2022-11-18: 1 mg via INTRAVENOUS
  Filled 2022-11-18: qty 1

## 2022-11-18 MED ORDER — HEMOSTATIC AGENTS (NO CHARGE) OPTIME
TOPICAL | Status: DC | PRN
  Administered 2022-11-18: 1 via TOPICAL

## 2022-11-18 MED ORDER — PROPOFOL 10 MG/ML IV BOLUS
INTRAVENOUS | Status: DC | PRN
  Administered 2022-11-18: 160 mg via INTRAVENOUS

## 2022-11-18 MED ORDER — LACTATED RINGERS IV SOLN: INTRAVENOUS | Status: DC

## 2022-11-18 MED ORDER — KETAMINE HCL 50 MG/5ML IJ SOSY
PREFILLED_SYRINGE | INTRAMUSCULAR | Status: AC
  Filled 2022-11-18: qty 5

## 2022-11-18 MED ORDER — LIDOCAINE 2% (20 MG/ML) 5 ML SYRINGE
INTRAMUSCULAR | Status: DC | PRN
  Administered 2022-11-18: 100 mg via INTRAVENOUS

## 2022-11-18 MED ORDER — ACETAMINOPHEN 500 MG PO TABS: 1000.0000 mg | ORAL_TABLET | Freq: Once | ORAL | Status: AC

## 2022-11-18 MED ORDER — ACETAMINOPHEN 500 MG PO TABS
ORAL_TABLET | ORAL | Status: AC
  Administered 2022-11-18: 1000 mg via ORAL
  Filled 2022-11-18: qty 2

## 2022-11-18 MED ORDER — DEXAMETHASONE SODIUM PHOSPHATE 10 MG/ML IJ SOLN
INTRAMUSCULAR | Status: AC
  Filled 2022-11-18: qty 1

## 2022-11-18 MED ORDER — ROCURONIUM BROMIDE 10 MG/ML (PF) SYRINGE
PREFILLED_SYRINGE | INTRAVENOUS | Status: AC
  Filled 2022-11-18: qty 10

## 2022-11-18 MED ORDER — DEXAMETHASONE SODIUM PHOSPHATE 10 MG/ML IJ SOLN
INTRAMUSCULAR | Status: DC | PRN
  Administered 2022-11-18: 5 mg via INTRAVENOUS

## 2022-11-18 MED ORDER — MEPERIDINE HCL 25 MG/ML IJ SOLN: 6.2500 mg | INTRAMUSCULAR | Status: DC | PRN

## 2022-11-18 MED ORDER — DIPHENHYDRAMINE HCL 12.5 MG/5ML PO ELIX: 12.5000 mg | ORAL_SOLUTION | Freq: Four times a day (QID) | ORAL | Status: DC | PRN

## 2022-11-18 MED ORDER — LIDOCAINE 2% (20 MG/ML) 5 ML SYRINGE
INTRAMUSCULAR | Status: AC
  Filled 2022-11-18: qty 5

## 2022-11-18 MED ORDER — GABAPENTIN 300 MG PO CAPS: 300.0000 mg | ORAL_CAPSULE | Freq: Once | ORAL | Status: AC

## 2022-11-18 MED ORDER — HYDROMORPHONE HCL 1 MG/ML IJ SOLN
0.2500 mg | INTRAMUSCULAR | Status: DC | PRN
  Administered 2022-11-18 (×2): 0.5 mg via INTRAVENOUS

## 2022-11-18 SURGICAL SUPPLY — 60 items
APL PRP STRL LF DISP 70% ISPRP (MISCELLANEOUS) ×1
BAG COUNTER SPONGE SURGICOUNT (BAG) ×2 IMPLANT
BAG SPNG CNTER NS LX DISP (BAG) ×1
BLADE CLIPPER SURG (BLADE) IMPLANT
CANISTER SUCT 3000ML PPV (MISCELLANEOUS) ×2 IMPLANT
CHLORAPREP W/TINT 26 (MISCELLANEOUS) ×2 IMPLANT
COVER MAYO STAND STRL (DRAPES) ×4 IMPLANT
COVER SURGICAL LIGHT HANDLE (MISCELLANEOUS) ×2 IMPLANT
DRAPE HALF SHEET 40X57 (DRAPES) ×2 IMPLANT
DRAPE LAPAROSCOPIC ABDOMINAL (DRAPES) ×2 IMPLANT
DRAPE UTILITY XL STRL (DRAPES) ×2 IMPLANT
DRAPE WARM FLUID 44X44 (DRAPES) ×2 IMPLANT
DRSG OPSITE POSTOP 4X10 (GAUZE/BANDAGES/DRESSINGS) IMPLANT
DRSG OPSITE POSTOP 4X12 (GAUZE/BANDAGES/DRESSINGS) IMPLANT
DRSG OPSITE POSTOP 4X8 (GAUZE/BANDAGES/DRESSINGS) IMPLANT
ELECT BLADE 6.5 EXT (BLADE) ×2 IMPLANT
ELECT CAUTERY BLADE 6.4 (BLADE) ×4 IMPLANT
ELECT REM PT RETURN 9FT ADLT (ELECTROSURGICAL) ×1
ELECTRODE REM PT RTRN 9FT ADLT (ELECTROSURGICAL) ×2 IMPLANT
GLOVE BIO SURGEON STRL SZ7 (GLOVE) ×4 IMPLANT
GLOVE BIOGEL PI IND STRL 6.5 (GLOVE) IMPLANT
GLOVE BIOGEL PI IND STRL 7.5 (GLOVE) ×4 IMPLANT
GLOVE SURG SS PI 6.5 STRL IVOR (GLOVE) IMPLANT
GOWN STRL REUS W/ TWL LRG LVL3 (GOWN DISPOSABLE) ×12 IMPLANT
GOWN STRL REUS W/TWL LRG LVL3 (GOWN DISPOSABLE) ×6
HANDLE SUCTION POOLE (INSTRUMENTS) ×2 IMPLANT
HEMOSTAT SNOW SURGICEL 2X4 (HEMOSTASIS) IMPLANT
KIT BASIN OR (CUSTOM PROCEDURE TRAY) ×2 IMPLANT
KIT SIGMOIDOSCOPE (SET/KITS/TRAYS/PACK) ×2 IMPLANT
KIT TURNOVER KIT B (KITS) ×2 IMPLANT
LEGGING LITHOTOMY PAIR STRL (DRAPES) ×2 IMPLANT
LIGASURE IMPACT 36 18CM CVD LR (INSTRUMENTS) IMPLANT
NS IRRIG 1000ML POUR BTL (IV SOLUTION) ×4 IMPLANT
PACK GENERAL/GYN (CUSTOM PROCEDURE TRAY) ×2 IMPLANT
PAD ARMBOARD 7.5X6 YLW CONV (MISCELLANEOUS) ×2 IMPLANT
PENCIL BUTTON HOLSTER BLD 10FT (ELECTRODE) ×2 IMPLANT
PENCIL SMOKE EVACUATOR (MISCELLANEOUS) ×2 IMPLANT
SPECIMEN JAR LARGE (MISCELLANEOUS) ×2 IMPLANT
SPONGE T-LAP 18X18 ~~LOC~~+RFID (SPONGE) IMPLANT
STAPLER CIRCULAR MANUAL XL 33 (STAPLE) IMPLANT
STAPLER CVD CUT GN 40 RELOAD (ENDOMECHANICALS) ×1 IMPLANT
STAPLER CVD CUT GRN 40 RELOAD (ENDOMECHANICALS) IMPLANT
STAPLER PROXIMATE 75MM BLUE (STAPLE) IMPLANT
STAPLER VISISTAT 35W (STAPLE) ×2 IMPLANT
SUCTION POOLE HANDLE (INSTRUMENTS) ×1
SURGILUBE 2OZ TUBE FLIPTOP (MISCELLANEOUS) ×2 IMPLANT
SUT PDS AB 1 TP1 96 (SUTURE) ×4 IMPLANT
SUT PROLENE 2 0 CT2 30 (SUTURE) IMPLANT
SUT PROLENE 2 0 KS (SUTURE) IMPLANT
SUT SILK 2 0 SH CR/8 (SUTURE) ×2 IMPLANT
SUT SILK 2 0 TIES 10X30 (SUTURE) ×2 IMPLANT
SUT SILK 3 0 SH CR/8 (SUTURE) ×2 IMPLANT
SUT SILK 3 0 TIES 10X30 (SUTURE) ×2 IMPLANT
SUT VIC AB 3-0 SH 18 (SUTURE) IMPLANT
SYR BULB IRRIG 60ML STRL (SYRINGE) ×2 IMPLANT
TOWEL GREEN STERILE (TOWEL DISPOSABLE) ×4 IMPLANT
TRAY FOLEY MTR SLVR 16FR STAT (SET/KITS/TRAYS/PACK) ×2 IMPLANT
TUBE CONNECTING 12X1/4 (SUCTIONS) ×2 IMPLANT
UNDERPAD 30X36 HEAVY ABSORB (UNDERPADS AND DIAPERS) ×2 IMPLANT
YANKAUER SUCT BULB TIP NO VENT (SUCTIONS) ×2 IMPLANT

## 2022-11-18 NOTE — Discharge Instructions (Addendum)
[facility]      [facility] °[phone] ° °OPEN ABDOMINAL SURGERY: POST OP INSTRUCTIONS ° °Always review your discharge instruction sheet given to you by the facility where your surgery was performed. ° °IF YOU HAVE DISABILITY OR FAMILY LEAVE FORMS, YOU MUST BRING THEM TO THE OFFICE FOR PROCESSING.  PLEASE DO NOT GIVE THEM TO YOUR DOCTOR. ° °A prescription for pain medication may be given to you upon discharge.  Take your pain medication as prescribed, if needed.  If narcotic pain medicine is not needed, then you may take acetaminophen (Tylenol) or ibuprofen (Advil) as needed. °Take your usually prescribed medications unless otherwise directed. °If you need a refill on your pain medication, please contact your pharmacy. They will contact our office to request authorization.  Prescriptions will not be filled after 5pm or on week-ends. °You should follow a light diet the first few days after arrival home, such as soup and crackers, pudding, etc.unless your doctor has advised otherwise. A high-fiber, low fat diet can be resumed as tolerated.   Be sure to include lots of fluids daily. Most patients will experience some swelling and bruising on the chest and neck area.  Ice packs will help.  Swelling and bruising can take several days to resolve °Most patients will experience some swelling and bruising in the area of the incision. Ice pack will help. Swelling and bruising can take several days to resolve..  °It is common to experience some constipation if taking pain medication after surgery.  Increasing fluid intake and taking a stool softener will usually help or prevent this problem from occurring.  A mild laxative (Milk of Magnesia or Miralax) should be taken according to package directions if there are no bowel movements after 48 hours. ° You may have steri-strips (small skin tapes) in place directly over the incision.  These strips should be left on the skin for 7-10 days.  If your surgeon used skin  glue on the incision, you may shower in 24 hours.  The glue will flake off over the next 2-3 weeks.  Any sutures or staples will be removed at the office during your follow-up visit. You may find that a light gauze bandage over your incision may keep your staples from being rubbed or pulled. You may shower and replace the bandage daily. °ACTIVITIES:  You may resume regular (light) daily activities beginning the next day--such as daily self-care, walking, climbing stairs--gradually increasing activities as tolerated.  You may have sexual intercourse when it is comfortable.  Refrain from any heavy lifting or straining until approved by your doctor. °You may drive when you no longer are taking prescription pain medication, you can comfortably wear a seatbelt, and you can safely maneuver your car and apply brakes °You should see your doctor in the office for a follow-up appointment approximately two weeks after your surgery.  Make sure that you call for this appointment within a day or two after you arrive home to insure a convenient appointment time. °OTHER INSTRUCTIONS:  °_____________________________________________________________ °_____________________________________________________________ ° °WHEN TO CALL YOUR DOCTOR: °Fever over 101.0 °Inability to urinate °Nausea and/or vomiting °Extreme swelling or bruising °Continued bleeding from incision. °Increased pain, redness, or drainage from the incision. °Difficulty swallowing or breathing °Muscle cramping or spasms. °Numbness or tingling in hands or feet or around lips. ° °The clinic staff is available to answer your questions during regular business hours.  Please don’t hesitate to call and ask to speak to one of the nurses if you   have concerns. ° °For further questions, please visit [email or web address] ° ° ° °

## 2022-11-18 NOTE — TOC Initial Note (Signed)
Transition of Care [facility]) - Initial/Assessment Note    Patient Details  Name: Richard Bowman MRN: [id number] Date of Birth: 1976-01-14  Transition of Care [facility]) CM/SW Contact:    Curlene Labrum, RN Phone Number: 11/18/2022, 3:15 PM  Clinical Narrative:                 CM unable to speak with the patient today regarding TOC needs - patient sleeping post-surgery today with Dr. Georgette Dover for S/P colectomy - staples, honeycomb dressing, Liver biopsy and port a cath placement.  The patient is also being seen by Dr. [name] - noted.  The patient has no insurance at this time  I sent a secure email message to Shanon Rosser, Financial counseling supervisor to screen for Medicaid.  Resources provided in AVS for financial resources and smoking cessation.  PCP - Patient's current PCP is [employer], [place] - noted in AVS as well.  TOC Team will continue to follow the patient for TOC needs.  Medication assistance to be provided through [place] since the patient has no documented insurance provider with prescription benefits.  Will follow for MATCH if needed.  Expected Discharge Plan: Home/Self Care Barriers to Discharge: Continued Medical Work up   Patient Goals and CMS Choice Patient states their goals for this hospitalization and ongoing recovery are:: unable to obtain at this time - patient sleeping          Expected Discharge Plan and Services In-house Referral: Development worker, community (Sent email to Shanon Rosser, Financial Counseling supervisor to follow up for Medicaid screening - patient with no insurance documented) Discharge Planning Services: CM Consult Post Acute Care Choice: Resumption of Svcs/PTA Provider Living arrangements for the past 2 months: Single Family Home                                      Prior Living Arrangements/Services Living arrangements for the past 2 months: Single Family Home Lives with:: Self          Need for Family Participation in  Patient Care: Yes (Comment) Care giver support system in place?: Yes (comment)   Criminal Activity/Legal Involvement Pertinent to Current Situation/Hospitalization: No - Comment as needed  Activities of Daily Living Home Assistive Devices/Equipment: None ADL Screening (condition at time of admission) Patient's cognitive ability adequate to safely complete daily activities?: Yes Is the patient deaf or have difficulty hearing?: No Does the patient have difficulty seeing, even when wearing glasses/contacts?: No Does the patient have difficulty concentrating, remembering, or making decisions?: No Patient able to express need for assistance with ADLs?: No Does the patient have difficulty dressing or bathing?: No Independently performs ADLs?: No Communication: Independent Dressing (OT): Independent Grooming: Independent Feeding: Independent Bathing: Independent Toileting: Independent In/Out Bed: Independent Walks in Home: Independent Does the patient have difficulty walking or climbing stairs?: No Weakness of Legs: None Weakness of Arms/Hands: None  Permission Sought/Granted Permission sought to share information with : Case Freight forwarder, Investment banker, corporate granted to share info w AGENCY: Email sent to Shanon Rosser, Financial Counselor to screen for Medicaid assistance        Emotional Assessment Appearance:: Appears stated age Attitude/Demeanor/Rapport: Sedated Affect (typically observed): Unable to Assess Orientation: : Oriented to Self, Oriented to Place, Oriented to  Time, Oriented to Situation Alcohol / Substance Use: Tobacco Use (Smoking cessation placed in resources) Psych Involvement:  No (comment)  Admission diagnosis:  Rectal bleeding [K62.5] Metastasis to liver (HCC) [C78.7] Generalized abdominal pain [R10.84] Transaminitis [R74.01] Patient Active Problem List   Diagnosis Date Noted   Generalized abdominal pain 11/17/2022   Metastasis  to liver ([place]) 11/17/2022   Abnormal CT of the abdomen 11/17/2022   Mass of colon 11/17/2022   Transaminitis 11/16/2022   Rectal bleeding 11/16/2022   Left arm pain 10/05/2020   Body aches 04/22/2016   Joint pain 04/02/2016   Muscle spasm 04/02/2016   Smoker 06/12/2014   Seizure-like activity ([place]) 06/12/2014   PCP:  [name], PA-C Pharmacy:   [facility], [place] [address] Phone: [phone] Fax: [phone]  [id number] [place], [place] [id number] [place] [id number] Phone: [phone] Fax: [phone]     Social Determinants of Health (SDOH) Social History: [place]: Patient Declined (11/16/2022)  Housing: Low Risk  (11/16/2022)  Transportation Needs: Patient Declined (11/16/2022)  Utilities: Patient Declined (11/16/2022)  Depression (PHQ2-9): Low Risk  (10/03/2020)  Tobacco Use: High Risk (11/18/2022)   SDOH Interventions:     Readmission Risk Interventions    11/18/2022    3:15 PM  Readmission Risk Prevention Plan  Post Dischage Appt Complete  Medication Screening Complete  Transportation Screening Complete

## 2022-11-18 NOTE — Anesthesia Preprocedure Evaluation (Addendum)
Anesthesia Evaluation  Patient identified by MRN, date of birth, ID band Patient awake    Reviewed: Allergy & Precautions, H&P , NPO status , Patient's Chart, lab work & pertinent test results  Airway Mallampati: II  TM Distance: >3 FB Neck ROM: Full    Dental no notable dental hx. (+) Dental Advisory Given   Pulmonary Current Smoker and Patient abstained from smoking.   Pulmonary exam normal breath sounds clear to auscultation       Cardiovascular negative cardio ROS Normal cardiovascular exam Rhythm:Regular Rate:Normal     Neuro/Psych negative neurological ROS  negative psych ROS   GI/Hepatic Neg liver ROS, PUD,,,  Endo/Other  negative endocrine ROS    Renal/GU Renal disease     Musculoskeletal negative musculoskeletal ROS (+)    Abdominal   Peds  Hematology negative hematology ROS (+)   Anesthesia Other Findings   Reproductive/Obstetrics                             Anesthesia Physical Anesthesia Plan  ASA: 2  Anesthesia Plan: General   Post-op Pain Management: Ofirmev IV (intra-op)*, Ketamine IV* and Dilaudid IV   Induction: Intravenous  PONV Risk Score and Plan: 3 and Treatment may vary due to age or medical condition, Ondansetron, Dexamethasone and Midazolam  Airway Management Planned: Oral ETT  Additional Equipment:   Intra-op Plan:   Post-operative Plan: Extubation in OR  Informed Consent: I have reviewed the patients History and Physical, chart, labs and discussed the procedure including the risks, benefits and alternatives for the proposed anesthesia with the patient or authorized representative who has indicated his/her understanding and acceptance.     Dental advisory given  Plan Discussed with: CRNA  Anesthesia Plan Comments: (2 x PIV)       Anesthesia Quick Evaluation

## 2022-11-18 NOTE — Anesthesia Procedure Notes (Signed)
Procedure Name: Intubation Date/Time: 11/18/2022 10:48 AM  Performed by: Alain Marion, CRNAPre-anesthesia Checklist: Patient identified, Emergency Drugs available, Suction available and Patient being monitored Patient Re-evaluated:Patient Re-evaluated prior to induction Oxygen Delivery Method: Circle System Utilized Preoxygenation: Pre-oxygenation with 100% oxygen Induction Type: IV induction Ventilation: Mask ventilation without difficulty Laryngoscope Size: Miller and 2 Grade View: Grade I Tube type: Oral Tube size: 7.5 mm Number of attempts: 1 Airway Equipment and Method: Stylet Placement Confirmation: ETT inserted through vocal cords under direct vision, positive ETCO2 and breath sounds checked- equal and bilateral Secured at: 22 cm Tube secured with: Tape Dental Injury: Teeth and Oropharynx as per pre-operative assessment

## 2022-11-18 NOTE — Progress Notes (Signed)
Richard Bowman will go to surgery today.  He was found to have a nearly obstructing lesion in the sigmoid colon.  As such, it is felt that he would benefit from a palliative colectomy.  His preop CEA is 140.  It sounds like Dr. Georgette Dover is going to do a liver biopsy which is fantastic.  We will be able to send off molecular markers.  Hopefully, we get a Port-A-Cath put into him during surgery.  I talked him about this today.  He clearly will need postop anticoagulation.  I think he would be at significant risk for thromboembolic disease.  He is having pain over on the right side of his abdomen.  I am sure this is from his liver metastasis.  Otherwise, everything looks quite good.  His labs are not all that bad.  He did have a CT scan of the chest done on 11/16/2022.  This did not show any obvious metastatic disease to the thoracic cavity.  There were 2 tiny pulmonary nodules which were of indeterminate significance.  We will just have to see how surgery goes.  He may need to have a colostomy which hopefully would only be temporary.  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  We will follow-up once he gets out of surgery.   Richard Haw, MD  Richard Bowman 41:10

## 2022-11-18 NOTE — Transfer of Care (Signed)
Immediate Anesthesia Transfer of Care Note  Patient: Richard Bowman  Procedure(s) Performed: OPEN SIGMOID COLECTOMY LIVER BIOPSY  Patient Location: PACU  Anesthesia Type:General  Level of Consciousness: awake, alert , and oriented  Airway & Oxygen Therapy: Patient Spontanous Breathing and Patient connected to face mask oxygen  Post-op Assessment: Report given to RN and Post -op Vital signs reviewed and stable  Post vital signs: Reviewed and stable  Last Vitals:  Vitals Value Taken Time  BP 154/85 11/18/22 1400  Temp 37.1 C 11/18/22 1345  Pulse 86 11/18/22 1402  Resp 15 11/18/22 1402  SpO2 96 % 11/18/22 1402  Vitals shown include unvalidated device data.  Last Pain:  Vitals:   11/18/22 1345  TempSrc:   PainSc: Asleep      Patients Stated Pain Goal: 3 ([id number])  Complications: No notable events documented.

## 2022-11-18 NOTE — Progress Notes (Signed)
1 Day Post-Op   Subjective/Chief Complaint: Patient is still having RUQ pain - ?related to distention or liver mets No nausea or vomiting Some flatus - no further bowel movements   Objective: Vital signs in last 24 hours: Temp:  [97.5 F (36.4 C)-102.1 F (38.9 C)] 98.4 F (36.9 C) (03/20 0808) Pulse Rate:  [53-126] 53 (03/20 0808) Resp:  [17-35] 18 (03/20 0808) BP: (110-138)/(66-89) 111/70 (03/20 0808) SpO2:  [91 %-99 %] 93 % (03/20 0808) Last BM Date : 11/17/22  Intake/Output from previous day: 03/19 0701 - 03/20 0700 In: 2220.7 [P.O.:360; I.V.:1806.4; IV Piggyback:54.3] Out: -  Intake/Output this shift: No intake/output data recorded.  WDWN in NAD Abd - distended; mild RUQ tenderness  Lab Results:  Recent Labs    11/15/22 2208 11/18/22 0324  WBC 12.6* 12.1*  HGB 13.2 12.0*  HCT 39.0 35.7*  PLT 285 275   BMET Recent Labs    11/15/22 2208 11/18/22 0324  NA 135 135  K 3.9 3.9  CL 99 99  CO2 22 27  GLUCOSE 151* 112*  BUN 10 12  CREATININE 1.05 1.35*  CALCIUM 9.2 8.9   PT/INR Recent Labs    11/16/22 1256  LABPROT 14.2  INR 1.1   ABG No results for input(s): "PHART", "HCO3" in the last 72 hours.  Invalid input(s): "PCO2", "PO2"  Studies/Results: DG Abd Portable 2V  Result Date: 11/17/2022 CLINICAL DATA:  Abdominal pain EXAM: PORTABLE ABDOMEN - 2 VIEW COMPARISON:  CT abdomen and pelvis dated 11/16/2022 FINDINGS: Nonobstructive bowel gas pattern. No free air or pneumatosis. No abnormal radio-opaque calculi or mass effect. No acute or substantial osseous abnormality. The sacrum and coccyx are partially obscured by overlying bowel contents. Partially imaged lung bases are clear. IMPRESSION: Nonobstructive bowel gas pattern. Electronically Signed   By: Darrin Nipper M.D.   On: 11/17/2022 18:54   CT CHEST W CONTRAST  Result Date: 11/16/2022 CLINICAL DATA:  New diagnosis of stage IV colon cancer. Assess for thoracic disease. EXAM: CT CHEST WITH CONTRAST  TECHNIQUE: Multidetector CT imaging of the chest was performed during intravenous contrast administration. RADIATION DOSE REDUCTION: This exam was performed according to the departmental dose-optimization program which includes automated exposure control, adjustment of the mA and/or kV according to patient size and/or use of iterative reconstruction technique. CONTRAST:  77mL OMNIPAQUE IOHEXOL 350 MG/ML SOLN COMPARISON:  None Available. FINDINGS: Cardiovascular: No significant vascular findings. Normal heart size. No pericardial effusion. Mediastinum/Nodes: No enlarged mediastinal, hilar, or axillary lymph nodes. Thyroid gland, trachea, and esophagus demonstrate no significant findings. Lungs/Pleura: Mean 7 mm subpleural pulmonary nodule is seen within the basilar right middle lobe at axial image # 108/7. Similar 3 mm subpleural pulmonary nodule noted within the right lower lobe at axial image # 103/7. While not completely excluded, these are considered of very low likelihood to represent pulmonary metastatic disease. No additional focal pulmonary nodules or infiltrates. No pneumothorax or pleural effusion. Central airways are widely patent. Upper Abdomen: Multiple hypoattenuating masses are again identified throughout the liver, better seen on previously performed CT examination of the abdomen pelvis in keeping with bilobar hepatic metastatic disease. Musculoskeletal: No acute bone abnormality. No lytic or blastic bone lesion. IMPRESSION: 1. No definite evidence of metastatic disease within the chest. 2. Two subpleural pulmonary nodules within the right lung, considered of very low likelihood to represent pulmonary metastatic disease. Continued observation on subsequent surveillance CT would be warranted. 3. Bilobar hepatic metastatic disease, better seen on previously performed CT examination of  the abdomen pelvis. Electronically Signed   By: Fidela Salisbury M.D.   On: 11/16/2022 23:25     Anti-infectives: Anti-infectives (From admission, onward)    Start     Dose/Rate Route Frequency Ordered Stop   11/18/22 0600  cefoTEtan (CEFOTAN) 2 g in sodium chloride 0.9 % 100 mL IVPB        2 g 200 mL/hr over 30 Minutes Intravenous On call to O.R. 11/17/22 1326 11/18/22 0649   11/18/22 0200  piperacillin-tazobactam (ZOSYN) IVPB 3.375 g       See Hyperspace for full Linked Orders Report.   3.375 g 12.5 mL/hr over 240 Minutes Intravenous Every 8 hours 11/17/22 1908     11/17/22 2000  piperacillin-tazobactam (ZOSYN) IVPB 3.375 g       See Hyperspace for full Linked Orders Report.   3.375 g 100 mL/hr over 30 Minutes Intravenous  Once 11/17/22 1908 11/17/22 2019       Assessment/Plan: Near obstructing sigmoid mass Multiple liver lesions - CT a/p shows numerous large enhancing masses within the liver most compatible with metastases; primary source not clearly visualized but there is abnormal soft tissue in the mesentery leading to the sigmoid colon which is concerning for adenopathy - CT chest with no definite evidence of metastatic disease within the chest - CEA 140 - Colonoscopy today revealed a malignant- appearing, intrinsic severe stenosis in the sigmoid colon that was non- traversed; an infiltrative near obstructing large friable mass was found in the sigmoid colon, the mass was circumferential, no bleeding was present, biopsies were taken, distal fold area 25cm from anal verge was tattooed. 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 Recommend sigmoid colectomy with attempt at primary anastomosis today.  The surgical procedure has been discussed with the patient.  Potential risks, benefits, alternative treatments, and expected outcomes have been explained.  All of the patient's questions at this time have been answered.  The likelihood of reaching the patient's treatment goal is good.  The patient understand the  proposed surgical procedure and wishes to proceed.  ID - none VTE - SCDs, per primary FEN - IVF, CLD, Boost breeze, NPO after midnight Foley - none   Tobacco abuse  LOS: 2 days    Maia Petties 11/18/2022

## 2022-11-18 NOTE — Anesthesia Postprocedure Evaluation (Signed)
Anesthesia Post Note  Patient: Richard Bowman  Procedure(s) Performed: OPEN SIGMOID COLECTOMY LIVER BIOPSY     Patient location during evaluation: PACU Anesthesia Type: General Level of consciousness: sedated and patient cooperative Pain management: pain level not controlled Vital Signs Assessment: post-procedure vital signs reviewed and stable Respiratory status: spontaneous breathing Cardiovascular status: stable Anesthetic complications: no   No notable events documented.  Last Vitals:  Vitals:   11/18/22 1430 11/18/22 1516  BP: (!) 153/95 (!) 158/97  Pulse: 85 81  Resp: 13   Temp: 37.1 C   SpO2: 96% 94%    Last Pain:  Vitals:   11/18/22 1430  TempSrc:   PainSc: 10-Worst pain ever                 Nolon Nations

## 2022-11-18 NOTE — Progress Notes (Signed)
[facility]                                                                               Richard Bowman, is a 47 y.o. male, DOB - 16-Nov-1975, [id number] Admit date - 11/15/2022    Outpatient Primary MD for the patient is [name], [name], PA-C  LOS - 2  days    Brief summary   47 year old male with history of esophageal ulcer but no other medical issues comes to the hospital with rectal bleeding. This started about 2 to 3 days ago. He reports about a 20 pound weight loss over the last several months, and also significant constipation. Otherwise he is healthy, active, and has no known medical problems. Imaging in the ED was concerning for liver mets and possible sigmoid mass. 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 Gen surgery consulted and he is scheduled for OR later today  Oncology consulted .  CT of the chest and abdomen shows No definite evidence of metastatic disease within the chest.  Two subpleural pulmonary nodules within the right lung, considered of very low likelihood to represent pulmonary metastatic disease. Continued observation on subsequent surveillance CT would be warranted. Numerous large enhancing masses within the liver most compatible with metastases.  Tobacco abuse:  On nicotine patch.   Lung nodules.  Surveillance CT in one week.   Mild AKI:  ? Dehydration , poor oral intake. Gentle hydration and repeat renal parameters in am.   Estimated body mass index is 28.98 kg/m as calculated from the following:   Height as of this encounter: 5\' 10"  (1.778 m).   Weight as of this encounter: 91.6 kg.  Code Status: full code.  DVT Prophylaxis:  enoxaparin (LOVENOX) injection 40 mg Start: 11/19/22 1000 SCD'Bowman Start: 11/18/22 1506 SCDs Start: 11/16/22 0959   Level  of Care: Level of care: Med-Surg Family Communication: none at bedside.   Disposition Plan:     Remains inpatient appropriate:  colectomy today.   Procedures:  Sigmoid colectomy, liver biopsy by Dr Georgette Dover  Consultants:   General surgery Surgery.   Antimicrobials:   Anti-infectives (From admission, onward)    Start     Dose/Rate Route Frequency Ordered Stop   11/18/22 0600  cefoTEtan (CEFOTAN) 2 g in sodium chloride 0.9 % 100 mL IVPB        2 g 200 mL/hr over 30 Minutes Intravenous On call to O.R. 11/17/22 1326 11/18/22 0649   11/18/22 0200  piperacillin-tazobactam (ZOSYN) IVPB 3.375 g  Status:  Discontinued       See Hyperspace for full Linked Orders Report.   3.375 g 12.5 mL/hr over 240 Minutes Intravenous Every 8 hours 11/17/22 1908 11/18/22 1505   11/17/22 2000  piperacillin-tazobactam (ZOSYN) IVPB 3.375 g       See Hyperspace for full Linked Orders Report.   3.375 g 100 mL/hr over 30 Minutes Intravenous  Once 11/17/22 1908 11/17/22 2019        Medications  Scheduled Meds:  Derrill Memo  ON 11/19/2022] enoxaparin (LOVENOX) injection  40 mg Subcutaneous Q24H   HYDROmorphone       HYDROmorphone   Intravenous Q4H   pantoprazole  40 mg Oral Daily   Continuous Infusions:  lactated ringers 75 mL/hr at 11/18/22 0635   PRN Meds:.acetaminophen, diphenhydrAMINE **OR** diphenhydrAMINE, hydrALAZINE, HYDROmorphone, naloxone **AND** sodium chloride flush, ondansetron **OR** ondansetron (ZOFRAN) IV    Subjective:   Toraino Scopel was seen and examined today.  No new complaints.   Objective:   Vitals:   11/18/22 1405 11/18/22 1415 11/18/22 1430 11/18/22 1516  BP:  (!) 149/89 (!) 153/95 (!) 158/97  Pulse: 84 81 85 81  Resp: (!) 0 14 13   Temp:   98.7 F (37.1 C)   TempSrc:      SpO2: 92% 93% 96% 94%  Weight:      Height:        Intake/Output Summary (Last 24 hours) at 11/18/2022 1551 Last data filed at 11/18/2022 1430 Gross per 24 hour  Intake 3470.67 ml  Output 450 ml   Net 3020.67 ml   Filed Weights   11/15/22 2208  Weight: 91.6 kg     Exam General exam: Appears calm and comfortable  Respiratory system: Clear to auscultation. Respiratory effort normal. Cardiovascular system: S1 & S2 heard, RRR. No JVD, Gastrointestinal system: Abdomen is nondistended, soft and nontender.  Central nervous system: Alert and oriented. No focal neurological deficits. Extremities: Symmetric 5 x 5 power. Skin: No rashes,  Psychiatry: Mood & affect appropriate.     Data Reviewed:  I have personally reviewed following labs and imaging studies   CBC Lab Results  Component Value Date   WBC 12.1 (H) 11/18/2022   RBC 4.05 (L) 11/18/2022   HGB 12.0 (L) 11/18/2022   HCT 35.7 (L) 11/18/2022   MCV 88.1 11/18/2022   MCH 29.6 11/18/2022   PLT 275 11/18/2022   MCHC 33.6 11/18/2022   RDW 12.4 11/18/2022   LYMPHSABS 1,892 03/19/2016   MONOABS 860 03/19/2016   EOSABS 86 03/19/2016   BASOSABS 0 [id number]     Last metabolic panel Lab Results  Component Value Date   NA 135 11/18/2022   K 3.9 11/18/2022   CL 99 11/18/2022   CO2 27 11/18/2022   BUN 12 11/18/2022   CREATININE 1.35 (H) 11/18/2022   GLUCOSE 112 (H) 11/18/2022   GFRNONAA >60 11/18/2022   GFRAA >60 03/09/2016   CALCIUM 8.9 11/18/2022   PHOS 2.1 (L) 06/17/2008   PROT 6.3 (L) 11/18/2022   ALBUMIN 2.9 (L) 11/18/2022   BILITOT 1.3 (H) 11/18/2022   ALKPHOS 246 (H) 11/18/2022   AST 80 (H) 11/18/2022   ALT 52 (H) 11/18/2022   ANIONGAP 9 11/18/2022    CBG (last 3)  No results for input(Bowman): "GLUCAP" in the last 72 hours.    Coagulation Profile: Recent Labs  Lab 11/16/22 1256  INR 1.1     Radiology Studies: DG Abd Portable 2V  Result Date: 11/17/2022 CLINICAL DATA:  Abdominal pain EXAM: PORTABLE ABDOMEN - 2 VIEW COMPARISON:  CT abdomen and pelvis dated 11/16/2022 FINDINGS: Nonobstructive bowel gas pattern. No free air or pneumatosis. No abnormal radio-opaque calculi or mass effect. No acute  or substantial osseous abnormality. The sacrum and coccyx are partially obscured by overlying bowel contents. Partially imaged lung bases are clear. IMPRESSION: Nonobstructive bowel gas pattern. Electronically Signed   By: Darrin Nipper M.D.   On: 11/17/2022 18:54   CT CHEST W CONTRAST  Result Date: 11/16/2022  CLINICAL DATA:  New diagnosis of stage IV colon cancer. Assess for thoracic disease. EXAM: CT CHEST WITH CONTRAST TECHNIQUE: Multidetector CT imaging of the chest was performed during intravenous contrast administration. RADIATION DOSE REDUCTION: This exam was performed according to the departmental dose-optimization program which includes automated exposure control, adjustment of the mA and/or kV according to patient size and/or use of iterative reconstruction technique. CONTRAST:  25mL OMNIPAQUE IOHEXOL 350 MG/ML SOLN COMPARISON:  None Available. FINDINGS: Cardiovascular: No significant vascular findings. Normal heart size. No pericardial effusion. Mediastinum/Nodes: No enlarged mediastinal, hilar, or axillary lymph nodes. Thyroid gland, trachea, and esophagus demonstrate no significant findings. Lungs/Pleura: Mean 7 mm subpleural pulmonary nodule is seen within the basilar right middle lobe at axial image # 108/7. Similar 3 mm subpleural pulmonary nodule noted within the right lower lobe at axial image # 103/7. While not completely excluded, these are considered of very low likelihood to represent pulmonary metastatic disease. No additional focal pulmonary nodules or infiltrates. No pneumothorax or pleural effusion. Central airways are widely patent. Upper Abdomen: Multiple hypoattenuating masses are again identified throughout the liver, better seen on previously performed CT examination of the abdomen pelvis in keeping with bilobar hepatic metastatic disease. Musculoskeletal: No acute bone abnormality. No lytic or blastic bone lesion. IMPRESSION: 1. No definite evidence of metastatic disease within the  chest. 2. Two subpleural pulmonary nodules within the right lung, considered of very low likelihood to represent pulmonary metastatic disease. Continued observation on subsequent surveillance CT would be warranted. 3. Bilobar hepatic metastatic disease, better seen on previously performed CT examination of the abdomen pelvis. Electronically Signed   By: Fidela Salisbury M.D.   On: 11/16/2022 23:25       Hosie Poisson M.D. [facility] 11/18/2022, 3:51 PM  Available via Epic secure chat 7am-7pm After 7 pm, please refer to night coverage provider listed on amion.

## 2022-11-18 NOTE — Op Note (Signed)
Pre-op diagnosis: Obstructing sigmoid mass with liver metastases, likely primary colon cancer Postop diagnosis: Same Procedure performed: Sigmoid colectomy, liver biopsy Surgeon:Avana Kreiser K Laura-Lee Villegas Assistant:Dr. Louanna Raw, Richard Miu PA-C Anesthesia: General Indications: This is a 47 year old male who presented with abdominal pain and bleeding per rectum.  He was found to have a malignant appearing mass in the sigmoid colon that was nearly completely obstructing.  CT scan showed multiple large liver masses consistent with metastases.  CT of the chest was negative for metastatic disease.  His CEA level was elevated at 140.  We recommended sigmoid colectomy and liver biopsy.  Description of procedure: The patient was brought to the operating room placed in the supine position on the operative table.  After an adequate level general anesthesia was obtained, a Foley catheter was placed under sterile technique.  His legs were placed in the lithotomy position in yellowfin stirrups.The patient's perineum was prepped with Betadine.  The patient's abdomen was prepped with ChloraPrep and draped in sterile fashion.  He had previously been marked by ostomy nurse for the possibility of a colostomy.    A timeout was taken to ensure the proper patient and proper procedure.  We made a vertical midline incision from just above the umbilicus down to the symphysis pubis.  Dissection was carried down the linea alba with cautery.  We entered the peritoneal cavity sharply.  There is no visible ascites.  No sign of peritoneal studding.  Patient has multiple firm masses on his liver.  We selected the mass that was at the most inferior edge.  I removed a 1 cm section of this mass and sent this for pathologic examination as liver biopsy.  The Bookwalter retractor was placed to provide exposure.  The patient has a mildly redundant sigmoid colon.  We divided the lateral attachments of the sigmoid colon at the white line of  Toldt.  We continued dissecting down towards the pelvis.  The mass is easily palpable.  The tattoo is below the palpable mass.  There is soft rectum that is palpated inferior to the mass.  We mobilized the sigmoid colon and upper rectum from the lateral attachments.  I divided the sigmoid colon about 10 cm proximal to the palpable mass with a GIA 75 stapler.  We began taking the mesentery with the LigaSure device.  However the patient has a very thick pedicle with multiple enlarged lymph nodes.  We carefully dissected through the pedicle.  Several large veins were ligated separately with 2-0 silk sutures.  The feeding vessel was ligated with 2-0 silk and divided.  We then continued mobilizing the section of the sigmoid colon down to the upper rectum with the LigaSure device.  The ureter was identified laterally on the left side and preserved.  We divided the upper rectum with the green contour stapler.  The specimen was removed and oriented with a suture at its distal margin.  We irrigated the abdomen thoroughly and inspected for hemostasis.  We inspected the liver biopsy and there is no bleeding.  The proximal colon is relatively decompressed with no firm stool.  We made the decision to proceed with a primary anastomosis.  The descending colon reaches easily down to the pelvis but with no tension.  We amputated the staple line of the descending colon.  There is a small amount of liquid stool within the colon but there is no spillage into the wound.  A pursestring suture of 2-0 Prolene was placed.  We used the EEA  sizers to select a 33 mm stapler.  The anvil was placed in the proximal stump and was secured with the 2-0 Prolene pursestring suture.  We again inspected the pelvis for hemostasis.  I went below and dilated the rectum up to 3 fingers.  The EEA sizer was passed easily up the rectal stump to the staple line  We removed the EEA sizer and passed the EEA stapler.  The spike was advanced through the staple line  and connected to the anvil.  We tightened down the stapler, making sure that no other surrounding structures were incorporated into the staple line.  The stapler was fired and held for several minutes.  We removed the stapler and identified to intact tissue donuts.  The distal tissue donut was sent as a distal margin.  The pelvis was filled with saline.  We occluded the colon proximal to the anastomosis.  I inserted a rigid sigmoidoscope and insufflated air.  There is no sign of air bubbles.  We released insufflation.  We continued irrigating the abdomen.  Hemostasis was good.  We changed our gowns and gloves in accordance with the colorectal protocol.  The fascia was then reapproximated with double-stranded #1 PDS suture.  The subcutaneous tissues were irrigated.  Staples were used to close the skin.  A honeycomb dressing was placed.  The patient was then extubated and brought to recovery room in stable condition.  All sponge, instrument, and needle counts are correct.  Imogene Burn. Georgette Dover, MD, [facility] Surgery  General Surgery   11/18/2022 1:52 PM

## 2022-11-19 ENCOUNTER — Encounter (HOSPITAL_COMMUNITY): Payer: Self-pay | Admitting: Surgery

## 2022-11-19 LAB — BASIC METABOLIC PANEL
Anion gap: 10 (ref 5–15)
BUN: 8 mg/dL (ref 6–20)
CO2: 26 mmol/L (ref 22–32)
Calcium: 8.9 mg/dL (ref 8.9–10.3)
Chloride: 99 mmol/L (ref 98–111)
Creatinine, Ser: 1.12 mg/dL (ref 0.61–1.24)
GFR, Estimated: >60 mL/min (ref >60)
Glucose, Bld: 114 mg/dL — ABNORMAL HIGH (ref 70–99)
Potassium: 4.2 mmol/L (ref 3.5–5.1)
Sodium: 135 mmol/L (ref 135–145)

## 2022-11-19 LAB — CBC
HCT: 37.2 % — ABNORMAL LOW (ref 39.0–52.0)
Hemoglobin: 12.8 g/dL — ABNORMAL LOW (ref 13.0–17.0)
MCH: 30.1 pg (ref 26.0–34.0)
MCHC: 34.4 g/dL (ref 30.0–36.0)
MCV: 87.5 fL (ref 80.0–100.0)
Platelets: 323 10*3/uL (ref 150–400)
RBC: 4.25 MIL/uL (ref 4.22–5.81)
RDW: 12.2 % (ref 11.5–15.5)
WBC: 16 10*3/uL — ABNORMAL HIGH (ref 4.0–10.5)
nRBC: 0 % (ref 0.0–0.2)

## 2022-11-19 MED ORDER — MUPIROCIN 2 % EX OINT
1.0000 | TOPICAL_OINTMENT | Freq: Two times a day (BID) | CUTANEOUS | Status: DC
  Administered 2022-11-19 – 2022-11-23 (×9): 1 via NASAL
  Filled 2022-11-19 (×4): qty 22

## 2022-11-19 MED ORDER — KETOROLAC TROMETHAMINE 30 MG/ML IJ SOLN
30.0000 mg | Freq: Four times a day (QID) | INTRAMUSCULAR | Status: DC
  Administered 2022-11-19 – 2022-11-23 (×16): 30 mg via INTRAVENOUS
  Filled 2022-11-19 (×16): qty 1

## 2022-11-19 MED ORDER — ALUM & MAG HYDROXIDE-SIMETH 200-200-20 MG/5ML PO SUSP
30.0000 mL | Freq: Four times a day (QID) | ORAL | Status: DC | PRN
  Administered 2022-11-19: 30 mL via ORAL
  Filled 2022-11-19: qty 30

## 2022-11-19 MED ORDER — FAMOTIDINE 40 MG/5ML PO SUSR
20.0000 mg | Freq: Two times a day (BID) | ORAL | Status: DC
  Administered 2022-11-19 – 2022-11-23 (×8): 20 mg via ORAL
  Filled 2022-11-19 (×10): qty 2.5

## 2022-11-19 MED ORDER — CHLORHEXIDINE GLUCONATE CLOTH 2 % EX PADS
6.0000 | MEDICATED_PAD | Freq: Every day | CUTANEOUS | Status: AC
  Administered 2022-11-19 – 2022-11-23 (×5): 6 via TOPICAL

## 2022-11-19 NOTE — Progress Notes (Signed)
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  WDWN in NAD Abd - soft, minimal distention, incisional tenderness RUQ tenderness seems better Incision c/d/I through honeycomb dressing  Lab Results:  Recent Labs    11/18/22 0324 11/19/22 0322  WBC 12.1* 16.0*  HGB 12.0* 12.8*  HCT 35.7* 37.2*  PLT 275 323   BMET Recent Labs    11/18/22 0324 11/19/22 0322  NA 135 135  K 3.9 4.2  CL 99 99  CO2 27 26  GLUCOSE 112* 114*  BUN 12 8  CREATININE 1.35* 1.12  CALCIUM 8.9 8.9   PT/INR Recent Labs    11/16/22 1256  LABPROT 14.2  INR 1.1   ABG No results for input(s): "PHART", "HCO3" in the last 72 hours.  Invalid input(s): "PCO2", "PO2"  Studies/Results: DG Abd Portable 2V  Result Date: 11/17/2022 CLINICAL DATA:  Abdominal pain EXAM: PORTABLE ABDOMEN - 2 VIEW COMPARISON:  CT abdomen and pelvis dated 11/16/2022 FINDINGS: Nonobstructive bowel gas pattern. No free air or pneumatosis. No abnormal radio-opaque calculi or mass effect. No acute or substantial osseous abnormality. The sacrum and coccyx are partially obscured by overlying bowel contents. Partially imaged lung bases are clear. IMPRESSION: Nonobstructive bowel gas pattern.  Electronically Signed   By: Darrin Nipper M.D.   On: 11/17/2022 18:54    Anti-infectives: Anti-infectives (From admission, onward)    Start     Dose/Rate Route Frequency Ordered Stop   11/18/22 0600  cefoTEtan (CEFOTAN) 2 g in sodium chloride 0.9 % 100 mL IVPB        2 g 200 mL/hr over 30 Minutes Intravenous On call to O.R. 11/17/22 1326 11/18/22 0649   11/18/22 0200  piperacillin-tazobactam (ZOSYN) IVPB 3.375 g  Status:  Discontinued       See Hyperspace for full Linked Orders Report.   3.375 g 12.5 mL/hr over 240 Minutes Intravenous Every 8 hours 11/17/22 1908 11/18/22 1505   11/17/22 2000  piperacillin-tazobactam (ZOSYN) IVPB 3.375 g       See Hyperspace for full Linked Orders Report.   3.375 g 100 mL/hr over 30 Minutes Intravenous  Once 11/17/22 1908 11/17/22 2019       Assessment/Plan: Near obstructing sigmoid mass Multiple liver lesions - CT a/p shows numerous large enhancing masses within the liver most compatible with metastases; primary source not clearly visualized but there is abnormal soft tissue in the mesentery leading to the sigmoid colon which is concerning for adenopathy - CT chest with no definite evidence of metastatic disease within the chest - CEA 140 - Colonoscopy revealed a malignant- appearing, intrinsic severe stenosis in the sigmoid colon that was non- traversed; an infiltrative near obstructing large friable mass was found in the sigmoid colon, the mass was circumferential, no bleeding was present, biopsies were  taken, distal fold area 25cm from anal verge was tattooed. 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   Sigmoid colectomy with primary anastomosis/ liver biopsy - 11/18/22 - [facility] Peet Pathology pending Encourage ambulation - PT evaluation today Abdominal binder Add Toradol to pain regimen - monitor Cr Continue PCA for now Await return of bowel function before advancing diet.   ID -  none VTE - SCDs, per primary FEN - IVF, CLD, Boost breeze,  Foley - none   Tobacco abuse  LOS: 3 days    Maia Petties 11/19/2022

## 2022-11-19 NOTE — Progress Notes (Signed)
Mobility Specialist - Progress Note   11/19/22 1515  Mobility  Activity Ambulated with assistance in hallway  Level of Assistance Standby assist, set-up cues, supervision of patient - no hands on  Assistive Device Front wheel walker  Distance Ambulated (ft) 300 ft  Activity Response Tolerated well  Mobility Referral Yes  $Mobility charge 1 Mobility   Pt was received in bed and agreeable to mobility. Pt c/o general pain and dizziness during ambulation (BP 128/76 upon return to room). Pt was returned to bed with all needs met.   Franki Monte  Mobility Specialist Please contact via Solicitor or Rehab office at [phone]

## 2022-11-19 NOTE — Progress Notes (Signed)
[facility]                                                                               [name], is a 47 y.o. male, DOB - [birth year], [id number] Admit date - 11/15/2022    Outpatient Primary MD for the patient is [name], [name], PA-C  LOS - 3  days    Brief summary   47 year old male with history of esophageal ulcer but no other medical issues comes to the hospital with rectal bleeding. This started about 2 to 3 days ago. He reports about a 20 pound weight loss over the last several months, and also significant constipation. Otherwise he is healthy, active, and has no known medical problems. Imaging in the ED was concerning for liver mets and possible sigmoid mass. 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 Gen surgery consulted and he is underwent sigmoid colectomy with primary anastomosis and liver biopsy on 11/18/22.  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   CT of the chest and abdomen shows No definite evidence of metastatic disease within the chest.  Two subpleural pulmonary nodules within the right lung, considered of very low likelihood to represent pulmonary metastatic disease. Continued observation on subsequent surveillance CT would be warranted. Numerous large enhancing masses within the liver most compatible with metastases.  Pain control with PCA.   Tobacco abuse:  On nicotine patch.   Lung nodules.  Surveillance CT in one year   Mild AKI:  ? Dehydration , poor oral intake. Gentle hydration and repeat renal parameters in am.  Repeat renal parameters show improvement.   Leukocytosis ?reactive.   Estimated body mass index is 28.98 kg/m as calculated from the following:   Height as of this encounter: 5\' 10"   (1.778 m).   Weight as of this encounter: 91.6 kg.  Code Status: full code.  DVT Prophylaxis:  enoxaparin (LOVENOX) injection 40 mg Start: 11/19/22 1000 SCDs Start: 11/16/22 0959   Level of Care: Level of care: Med-Surg Family Communication: none at bedside.   Disposition Plan:     Remains inpatient appropriate:  colectomy today.   Procedures:  Sigmoid colectomy, liver biopsy by Dr Georgette Dover  Consultants:   General surgery Surgery.   Antimicrobials:   Anti-infectives (From admission, onward)    Start     Dose/Rate Route Frequency Ordered Stop   11/18/22 0600  cefoTEtan (CEFOTAN) 2 g in sodium chloride 0.9 % 100 mL IVPB        2 g 200 mL/hr over 30 Minutes Intravenous On call to O.R. 11/17/22 1326 11/18/22 0649   11/18/22 0200  piperacillin-tazobactam (ZOSYN) IVPB 3.375 g  Status:  Discontinued       See Hyperspace for full Linked Orders Report.   3.375 g 12.5 mL/hr over 240 Minutes Intravenous Every 8 hours 11/17/22 1908 11/18/22 1505   11/17/22 2000  piperacillin-tazobactam (ZOSYN) IVPB 3.375 g       See Hyperspace for full Linked Orders Report.  3.375 g 100 mL/hr over 30 Minutes Intravenous  Once 11/17/22 1908 11/17/22 2019        Medications  Scheduled Meds:  Chlorhexidine Gluconate Cloth  6 each Topical Q0600   enoxaparin (LOVENOX) injection  40 mg Subcutaneous Q24H   HYDROmorphone   Intravenous Q4H   ketorolac  30 mg Intravenous Q6H   mupirocin ointment  1 Application Nasal BID   pantoprazole  40 mg Oral Daily   Continuous Infusions:  lactated ringers 100 mL/hr at 11/19/22 0846   PRN Meds:.acetaminophen, diphenhydrAMINE **OR** diphenhydrAMINE, hydrALAZINE, methocarbamol, naloxone **AND** sodium chloride flush, ondansetron **OR** ondansetron (ZOFRAN) IV    Subjective:   Richard Bowman was seen and examined today.  Significant pain, not passing flatus yet.   Objective:   Vitals:   11/19/22 0802 11/19/22 0840 11/19/22 1147 11/19/22 1228  BP: 137/88   126/88   Pulse: 83  (!) 101   Resp:  18  19  Temp: 98 F (36.7 C)  (!) 97.5 F (36.4 C)   TempSrc: Oral  Oral   SpO2: 96% 97% 95% 95%  Weight:      Height:        Intake/Output Summary (Last 24 hours) at 11/19/2022 1341 Last data filed at 11/19/2022 1147 Gross per 24 hour  Intake --  Output 2050 ml  Net -2050 ml    Filed Weights   11/15/22 2208  Weight: 91.6 kg     Exam General exam: Appears calm and comfortable  Respiratory system: Clear to auscultation. Respiratory effort normal. Cardiovascular system: S1 & S2 heard, RRR. No JVD, murmurs,  Gastrointestinal system: Abdomen is soft, tender, bandaged. No BS  Central nervous system: Alert and oriented. No focal neurological deficits. Extremities: Symmetric 5 x 5 power. Skin: No rashes, lesions or ulcers Psychiatry: Mood & affect appropriate.      Data Reviewed:  I have personally reviewed following labs and imaging studies   CBC Lab Results  Component Value Date   WBC 16.0 (H) 11/19/2022   RBC 4.25 11/19/2022   HGB 12.8 (L) 11/19/2022   HCT 37.2 (L) 11/19/2022   MCV 87.5 11/19/2022   MCH 30.1 11/19/2022   PLT 323 11/19/2022   MCHC 34.4 11/19/2022   RDW 12.2 11/19/2022   LYMPHSABS 1,892 03/19/2016   MONOABS 860 03/19/2016   EOSABS 86 03/19/2016   BASOSABS 0 [id number]     Last metabolic panel Lab Results  Component Value Date   NA 135 11/19/2022   K 4.2 11/19/2022   CL 99 11/19/2022   CO2 26 11/19/2022   BUN 8 11/19/2022   CREATININE 1.12 11/19/2022   GLUCOSE 114 (H) 11/19/2022   GFRNONAA >60 11/19/2022   GFRAA >60 03/09/2016   CALCIUM 8.9 11/19/2022   PHOS 2.1 (L) 06/17/2008   PROT 6.3 (L) 11/18/2022   ALBUMIN 2.9 (L) 11/18/2022   BILITOT 1.3 (H) 11/18/2022   ALKPHOS 246 (H) 11/18/2022   AST 80 (H) 11/18/2022   ALT 52 (H) 11/18/2022   ANIONGAP 10 11/19/2022    CBG (last 3)  No results for input(Bowman): "GLUCAP" in the last 72 hours.    Coagulation Profile: Recent Labs  Lab  11/16/22 1256  INR 1.1      Radiology Studies: DG Abd Portable 2V  Result Date: 11/17/2022 CLINICAL DATA:  Abdominal pain EXAM: PORTABLE ABDOMEN - 2 VIEW COMPARISON:  CT abdomen and pelvis dated 11/16/2022 FINDINGS: Nonobstructive bowel gas pattern. No free air or pneumatosis. No abnormal radio-opaque calculi or mass  effect. No acute or substantial osseous abnormality. The sacrum and coccyx are partially obscured by overlying bowel contents. Partially imaged lung bases are clear. IMPRESSION: Nonobstructive bowel gas pattern. Electronically Signed   By: Darrin Nipper M.D.   On: 11/17/2022 18:54       Hosie Poisson M.D. [facility] 11/19/2022, 1:41 PM  Available via Epic secure chat 7am-7pm After 7 pm, please refer to night coverage provider listed on amion.

## 2022-11-19 NOTE — Progress Notes (Signed)
Mr. Finucan had surgery yesterday.  Dr. Georgette Dover did a fantastic job.  He does not have any colostomy bag.  This really makes him happy.  Dr. Georgette Dover did do a liver biopsy.  As expected, he is having some discomfort.  He is on a PCA pump.  I am sure that he will move around today.  His labs look pretty good.  His white cell count 16.  Hemoglobin 12.8.  Platelet count 323,000.  His BUN is 8 creatinine 1.12.  We now have the specimen that we can send off for molecular testing.  Once we get the pathology result back, we will then plan for chemotherapy.  We will have to see what the molecular analysis shows.  The colonoscopy that he had and biopsy did show an invasive moderately differentiated adenocarcinoma.  Given his young age, I am sure that he would be candidate for genetic testing.  He has had no fever.  On his exam, he has healing laparoscopic wounds.  There is no fluid wave.  There is decreased bowel sounds.  We will continue to follow along.  Really not much that we need to do right now.  We does need to get pathology results back from the liver and send this off for molecular analysis.   Lattie Haw, MD  Psalms 56:4

## 2022-11-19 NOTE — TOC Progression Note (Signed)
Transition of Care [facility]) - Progression Note    Patient Details  Name: Richard Bowman MRN: [id number] Date of Birth: 15-Dec-1975  Transition of Care [facility]) CM/SW [place], RN Phone Number: 11/19/2022, 10:44 AM  Clinical Narrative:    CM met with the patient at the bedside after patient walked in hallway with PT this morning.  The patient is S/P colectomy and has staples and honeycomb dressing intact with no drains.  The patient was sitting up in the chair and currently has PCA pump for pain at this time with incentive spirometer at bedside.  The patient states that he lives alone and plans to stay with a friend in [place], [place] when he is discharged home.  Patient has transportation home with friend.  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  I placed an email with Financial counselor to assist with Medicaid application and patient is aware. I updated the patient's cell phone number in the contacts since it has changed.  Discharge medications will be provided through the [facility].  The patient normally gets medications filled through [facility] and [facility]. I will follow the patient for placement of [facility] closer to discharge if needed.  PCP is with Carrolyn Meiers, PA at [facility] for follow up.       Expected Discharge Plan: Home/Self Care Barriers to Discharge: Continued Medical Work up  Expected Discharge Plan and Services In-house Referral: Development worker, community (Sent email to Shanon Rosser, Financial Counseling supervisor to follow up for Medicaid screening - patient with no insurance documented) Discharge Planning Services: CM Consult Post Acute Care Choice: Resumption of Svcs/PTA Provider, Durable Medical Equipment (Patient has a RW at home) Living arrangements for the past 2 months: Single Family Home                                       Social Determinants of Health (SDOH)  Interventions [place]: Patient Declined (11/16/2022)  Housing: Low Risk  (11/16/2022)  Transportation Needs: Patient Declined (11/16/2022)  Utilities: Patient Declined (11/16/2022)  Depression (PHQ2-9): Low Risk  (10/03/2020)  Tobacco Use: High Risk (11/19/2022)    Readmission Risk Interventions    11/18/2022    3:15 PM  Readmission Risk Prevention Plan  Post Dischage Appt Complete  Medication Screening Complete  Transportation Screening Complete

## 2022-11-19 NOTE — Evaluation (Signed)
Physical Therapy Evaluation Patient Details Name: Richard Bowman MRN: [id number] DOB: 05-Jul-1976 Today's Date: 11/19/2022  History of Present Illness  AMAHD BRAKEMAN is a 47 y.o. male admitted 11/15/22 with 3-4 months of generalized abdominal pain and intermittent bright red blood per rectum.  Found to have sigmoid colon obstructing mass with liver metastases.  Underwent sigmoid colectomy and liver biopsy 11/18/22.  Clinical Impression  Patient presents with decreased mobility due to pain in abdomen limiting mobility.  Currently min A to supervision for hallway ambulation, more pain with bed mobility as anticipated.  Patient working on some trunk extension as tolerated.  Feel he will benefit from skilled PT in the acute setting, but likely will not need follow up PT at d/c.  States plans to return to home with a friend initially.       Recommendations for follow up therapy are one component of a multi-disciplinary discharge planning process, led by the attending physician.  Recommendations may be updated based on patient status, additional functional criteria and insurance authorization.  Follow Up Recommendations No PT follow up      Assistance Recommended at Discharge Intermittent Supervision/Assistance  Patient can return home with the following  A little help with walking and/or transfers;Assistance with cooking/housework;Assist for transportation;Direct supervision/assist for medications management;Help with stairs or ramp for entrance;A little help with bathing/dressing/bathroom    Equipment Recommendations Rolling walker (2 wheels)  Recommendations for Other Services       Functional Status Assessment Patient has had a recent decline in their functional status and demonstrates the ability to make significant improvements in function in a reasonable and predictable amount of time.     Precautions / Restrictions Precautions Precautions: Fall Precaution Comments: PCA, abdominal  binder      Mobility  Bed Mobility Overal bed mobility: Needs Assistance Bed Mobility: Rolling, Sidelying to Sit Rolling: Supervision Sidelying to sit: Min assist, HOB elevated       General bed mobility comments: increased time, cues for technique, heavy use of rail with min A for trunk pt in obvious pain despite PCA    Transfers Overall transfer level: Needs assistance Equipment used: Rolling walker (2 wheels) Transfers: Sit to/from Stand Sit to Stand: Supervision, From elevated surface           General transfer comment: pain evident in sitting EOB so elevated height of bed for less hip flexion and pt stood on up to RW    Ambulation/Gait Ambulation/Gait assistance: Supervision Gait Distance (Feet): 200 Feet Assistive device: Rolling walker (2 wheels) Gait Pattern/deviations: Step-through pattern, Trunk flexed, Wide base of support, Decreased stride length       General Gait Details: slow and painful stops to lean forward versus trying to straighten up, RR elevated throughout in 30's-40's  Stairs            Wheelchair Mobility    Modified Rankin (Stroke Patients Only)       Balance Overall balance assessment: Needs assistance   Sitting balance-Leahy Scale: Good       Standing balance-Leahy Scale: Fair Standing balance comment: able to stand without UE support, but relying on IV pole or RW to take steps                             Pertinent Vitals/Pain Pain Assessment Pain Assessment: 0-10 Pain Score: 7  Pain Location: abdomen Pain Descriptors / Indicators: Sore, Sharp, Aching Pain Intervention(s): Monitored during session, Limited  activity within patient's tolerance, PCA encouraged    Home Living Family/patient expects to be discharged to:: Private residence Living Arrangements: Alone Available Help at Discharge: Friend(s);Family;Available 24 hours/day (plans to stay with a friend initially) Type of Home: House Home Access:  Stairs to enter Entrance Stairs-Rails: Right Entrance Stairs-Number of Steps: 3   Home Layout: One level Home Equipment: None      Prior Function Prior Level of Function : Independent/Modified Independent             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   Dominant Hand: Right    Extremity/Trunk Assessment   Upper Extremity Assessment Upper Extremity Assessment: LUE deficits/detail LUE Deficits / Details: WFL but pain at rest    Lower Extremity Assessment Lower Extremity Assessment: Generalized weakness;RLE deficits/detail;LLE deficits/detail RLE Deficits / Details: abdominal pain with hip flexion LLE Deficits / Details: abdominal pain with hip flexion    Cervical / Trunk Assessment Cervical / Trunk Assessment: Other exceptions Cervical / Trunk Exceptions: abdominal surgery  Communication   Communication: No difficulties  Cognition Arousal/Alertness: Awake/alert Behavior During Therapy: WFL for tasks assessed/performed Overall Cognitive Status: Impaired/Different from baseline Area of Impairment: Orientation                 Orientation Level: Time, Disoriented to             General Comments: trying to remember when surgery happened and what day it was, likely due to meds        General Comments General comments (skin integrity, edema, etc.): SpO2 after ambulation on RA 95%, HR 122; RN aware    Exercises     Assessment/Plan    PT Assessment Patient needs continued PT services  PT Problem List Decreased balance;Decreased strength;Pain;Decreased knowledge of use of DME;Decreased mobility;Decreased activity tolerance       PT Treatment Interventions DME instruction;Functional mobility training;Gait training;Stair training;Therapeutic exercise;Therapeutic activities;Patient/family education    PT Goals (Current goals can be found in the Care Plan section)  Acute Rehab PT Goals Patient Stated Goal: to return to  independent PT Goal Formulation: With patient Time For Goal Achievement: 12/03/22 Potential to Achieve Goals: Good    Frequency Min 3X/week     Co-evaluation               AM-PAC PT "6 Clicks" Mobility  Outcome Measure Help needed turning from your back to your side while in a flat bed without using bedrails?: A Lot Help needed moving from lying on your back to sitting on the side of a flat bed without using bedrails?: A Lot Help needed moving to and from a bed to a chair (including a wheelchair)?: A Little Help needed standing up from a chair using your arms (e.g., wheelchair or bedside chair)?: A Little Help needed to walk in hospital room?: A Little Help needed climbing 3-5 steps with a railing? : Total 6 Click Score: 14    End of Session Equipment Utilized During Treatment: Other (comment) (abdominal binder) Activity Tolerance: Patient limited by pain Patient left: in chair;with call bell/phone within reach   PT Visit Diagnosis: Pain;Difficulty in walking, not elsewhere classified (R26.2) Pain - part of body:  (abdomen)    Time: [id number] PT Time Calculation (min) (ACUTE ONLY): 38 min   Charges:   PT Evaluation $PT Eval Moderate Complexity: 1 Mod PT Treatments $Gait Training: 8-22 mins        Magda Kiel, PT Acute  Rehabilitation Services Office:[phone] 11/19/2022   Reginia Naas 11/19/2022, 10:17 AM

## 2022-11-20 DIAGNOSIS — C787 Secondary malignant neoplasm of liver and intrahepatic bile duct: Secondary | ICD-10-CM

## 2022-11-20 DIAGNOSIS — K6389 Other specified diseases of intestine: Secondary | ICD-10-CM

## 2022-11-20 DIAGNOSIS — R1084 Generalized abdominal pain: Secondary | ICD-10-CM

## 2022-11-20 LAB — BASIC METABOLIC PANEL
Anion gap: 9 (ref 5–15)
BUN: 13 mg/dL (ref 6–20)
CO2: 28 mmol/L (ref 22–32)
Calcium: 9 mg/dL (ref 8.9–10.3)
Chloride: 98 mmol/L (ref 98–111)
Creatinine, Ser: 1.15 mg/dL (ref 0.61–1.24)
GFR, Estimated: >60 mL/min (ref >60)
Glucose, Bld: 92 mg/dL (ref 70–99)
Potassium: 4 mmol/L (ref 3.5–5.1)
Sodium: 135 mmol/L (ref 135–145)

## 2022-11-20 MED ORDER — HYDROMORPHONE HCL 1 MG/ML IJ SOLN
0.5000 mg | INTRAMUSCULAR | Status: DC | PRN
  Administered 2022-11-20: 1 mg via INTRAVENOUS
  Filled 2022-11-20: qty 1

## 2022-11-20 MED ORDER — ACETAMINOPHEN 500 MG PO TABS: 1000.0000 mg | ORAL_TABLET | Freq: Four times a day (QID) | ORAL | Status: DC

## 2022-11-20 MED ORDER — METHOCARBAMOL 500 MG PO TABS
500.0000 mg | ORAL_TABLET | Freq: Four times a day (QID) | ORAL | Status: DC
  Administered 2022-11-20 – 2022-11-23 (×13): 500 mg via ORAL
  Filled 2022-11-20 (×13): qty 1

## 2022-11-20 MED ORDER — OXYCODONE HCL 5 MG PO TABS
5.0000 mg | ORAL_TABLET | ORAL | Status: DC | PRN
  Administered 2022-11-20: 5 mg via ORAL
  Administered 2022-11-21 – 2022-11-22 (×5): 10 mg via ORAL
  Filled 2022-11-20 (×2): qty 2
  Filled 2022-11-20: qty 1
  Filled 2022-11-20 (×4): qty 2

## 2022-11-20 MED ORDER — ACETAMINOPHEN 500 MG PO TABS
1000.0000 mg | ORAL_TABLET | Freq: Three times a day (TID) | ORAL | Status: DC
  Administered 2022-11-20 – 2022-11-23 (×10): 1000 mg via ORAL
  Filled 2022-11-20 (×10): qty 2

## 2022-11-20 NOTE — Progress Notes (Signed)
Patient PCA pump discontinued at this time. Ending totals 1.8 Demand 9, Delivered 9, wasted 4mLs of dilaudid with Personnel officer.

## 2022-11-20 NOTE — Progress Notes (Signed)
2 Days Post-Op   Subjective/Chief Complaint: Patient states his pain is overall better compared to pre-op. Rates pain as 6/10, he says he does not feel like the PCA does much to help his pain, and the beeping/nasal cannula do bother him a little.  No nausea or vomiting - tolerating clears. Reports drinking boost breeze, juice, ice pops, and had 2 cups of broth for dinner.      Objective: Vital signs in last 24 hours: Temp:  [97.5 F (36.4 C)-98.1 F (36.7 C)] 97.7 F (36.5 C) (03/22 0749) Pulse Rate:  [62-101] 65 (03/22 0749) Resp:  [13-20] 16 (03/22 0749) BP: (115-130)/(74-89) 115/84 (03/22 0749) SpO2:  [91 %-100 %] 98 % (03/22 0749) Weight:  [93.5 kg] 93.5 kg (03/22 0536) Last BM Date : 11/17/22  Intake/Output from previous day: 03/21 0701 - 03/22 0700 In: 1311.9 [P.O.:240; I.V.:1071.9] Out: 400 [Urine:400] Intake/Output this shift: No intake/output data recorded.  WDWN in NAD Abd - soft, minimal distention, incisional tenderness Incision c/d/I through honeycomb dressing  Lab Results:  Recent Labs    11/18/22 0324 11/19/22 0322  WBC 12.1* 16.0*  HGB 12.0* 12.8*  HCT 35.7* 37.2*  PLT 275 323   BMET Recent Labs    11/18/22 0324 11/19/22 0322  NA 135 135  K 3.9 4.2  CL 99 99  CO2 27 26  GLUCOSE 112* 114*  BUN 12 8  CREATININE 1.35* 1.12  CALCIUM 8.9 8.9   PT/INR No results for input(s): "LABPROT", "INR" in the last 72 hours.  ABG No results for input(s): "PHART", "HCO3" in the last 72 hours.  Invalid input(s): "PCO2", "PO2"  Studies/Results: No results found.  Anti-infectives: Anti-infectives (From admission, onward)    Start     Dose/Rate Route Frequency Ordered Stop   11/18/22 0600  cefoTEtan (CEFOTAN) 2 g in sodium chloride 0.9 % 100 mL IVPB        2 g 200 mL/hr over 30 Minutes Intravenous On call to O.R. 11/17/22 1326 11/18/22 0649   11/18/22 0200  piperacillin-tazobactam (ZOSYN) IVPB 3.375 g  Status:  Discontinued       See Hyperspace  for full Linked Orders Report.   3.375 g 12.5 mL/hr over 240 Minutes Intravenous Every 8 hours 11/17/22 1908 11/18/22 1505   11/17/22 2000  piperacillin-tazobactam (ZOSYN) IVPB 3.375 g       See Hyperspace for full Linked Orders Report.   3.375 g 100 mL/hr over 30 Minutes Intravenous  Once 11/17/22 1908 11/17/22 2019       Assessment/Plan: Near obstructing sigmoid mass Multiple liver lesions - CT a/p shows numerous large enhancing masses within the liver most compatible with metastases; primary source not clearly visualized but there is abnormal soft tissue in the mesentery leading to the sigmoid colon which is concerning for adenopathy - CT chest with no definite evidence of metastatic disease within the chest - CEA 140 - Colonoscopy revealed a malignant- appearing, intrinsic severe stenosis in the sigmoid colon that was non- traversed; an infiltrative near obstructing large friable mass was found in the sigmoid colon, the mass was circumferential, no bleeding was present, biopsies were taken, distal fold area 25cm from anal verge was tattooed. 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   Sigmoid colectomy with primary anastomosis/ liver biopsy - 11/18/22 - [facility] Pathology pending Encourage ambulation - PT recommending no follow up Abdominal binder Toradol started 3/21 - BMP this AM pending,  monitor Cr  Pain: D/C PCA today. Schedule tylenol 1,000 mg q8h, schedule robaxin 500 mg q 6h, start oxycodone 5-10 MG q 4h PRN. IV dilaudid q 3h PRN for breakthrough. Tolerating CLD without nausea. Allow FLD, do not advance diet further until return of bowel function.   ID - none VTE - SCDs, per primary FEN - IVF, FLD, ensure  Foley - none   Tobacco abuse  LOS: 4 days    Jill Alexanders PA-C 11/20/2022

## 2022-11-20 NOTE — Progress Notes (Signed)
Mobility Specialist - Progress Note   11/20/22 1140  Mobility  Activity Ambulated with assistance in hallway  Level of Assistance Standby assist, set-up cues, supervision of patient - no hands on  Assistive Device Front wheel walker  Distance Ambulated (ft) 500 ft  Activity Response Tolerated fair  Mobility Referral Yes  $Mobility charge 1 Mobility   Pt was received in bed and agreeable to mobility. Pt c/o abdominal pain throughout session. No complaints of dizziness this session. Pt was returned to bed with all needs met.   Richard Bowman  Mobility Specialist Please contact via Solicitor or Rehab office at [phone]

## 2022-11-20 NOTE — Progress Notes (Signed)
Physical Therapy Treatment Patient Details Name: [name] MRN: [id number] DOB: [birth year] Today's Date: 11/20/2022   History of Present Illness Richard Bowman is a 47 y.o. male admitted 11/15/22 with 3-4 months of generalized abdominal pain and intermittent bright red blood per rectum.  Found to have sigmoid colon obstructing mass with liver metastases.  Underwent sigmoid colectomy and liver biopsy 11/18/22.    PT Comments    Pt progressing well with mobility, ambulating 700+ ft in hallway with use of RW and supervision for safety only. Pt reporting significant abdominal pain throughout session, he pressed PCA pump x2 during PT. PT encouraged up and walking with staff at least 4x/day, pt expresses understanding. PT to continue to follow.     Recommendations for follow up therapy are one component of a multi-disciplinary discharge planning process, led by the attending physician.  Recommendations may be updated based on patient status, additional functional criteria and insurance authorization.  Follow Up Recommendations  No PT follow up     Assistance Recommended at Discharge Intermittent Supervision/Assistance  Patient can return home with the following A little help with walking and/or transfers;Assistance with cooking/housework;Assist for transportation;Direct supervision/assist for medications management;Help with stairs or ramp for entrance;A little help with bathing/dressing/bathroom   Equipment Recommendations  Rolling walker (2 wheels)    Recommendations for Other Services       Precautions / Restrictions Precautions Precautions: Fall Precaution Comments: PCA, abdominal binder Restrictions Weight Bearing Restrictions: No     Mobility  Bed Mobility Overal bed mobility: Needs Assistance             General bed mobility comments: up in chair    Transfers Overall transfer level: Needs assistance Equipment used: Rolling walker (2 wheels) Transfers: Sit  to/from Stand Sit to Stand: Supervision           General transfer comment: for safety, no physical assist    Ambulation/Gait Ambulation/Gait assistance: Supervision Gait Distance (Feet): 700 Feet Assistive device: Rolling walker (2 wheels) Gait Pattern/deviations: Step-through pattern, Trunk flexed, Decreased stride length Gait velocity: mildly decr     General Gait Details: cues for upright posture as tolerated   Stairs             Wheelchair Mobility    Modified Rankin (Stroke Patients Only)       Balance Overall balance assessment: Needs assistance Sitting-balance support: No upper extremity supported Sitting balance-Leahy Scale: Good     Standing balance support: Bilateral upper extremity supported Standing balance-Leahy Scale: Fair Standing balance comment: for min steadying, pain bracing                            Cognition Arousal/Alertness: Awake/alert Behavior During Therapy: WFL for tasks assessed/performed Overall Cognitive Status: Within Functional Limits for tasks assessed                                          Exercises      General Comments        Pertinent Vitals/Pain Pain Assessment Pain Assessment: 0-10 Pain Score: 7  Pain Location: abdomen Pain Descriptors / Indicators: Sore Pain Intervention(s): Limited activity within patient's tolerance, Monitored during session, Repositioned, Other (comment) (Pt used PCA pump x2 during session)    Home Living  Prior Function            PT Goals (current goals can now be found in the care plan section) Acute Rehab PT Goals Patient Stated Goal: to return to independent PT Goal Formulation: With patient Time For Goal Achievement: 12/03/22 Potential to Achieve Goals: Good Progress towards PT goals: Progressing toward goals    Frequency    Min 3X/week      PT Plan Current plan remains appropriate     Co-evaluation              AM-PAC PT "6 Clicks" Mobility   Outcome Measure  Help needed turning from your back to your side while in a flat bed without using bedrails?: A Little Help needed moving from lying on your back to sitting on the side of a flat bed without using bedrails?: A Little Help needed moving to and from a bed to a chair (including a wheelchair)?: A Little Help needed standing up from a chair using your arms (e.g., wheelchair or bedside chair)?: A Little Help needed to walk in hospital room?: A Little Help needed climbing 3-5 steps with a railing? : A Lot 6 Click Score: 17    End of Session Equipment Utilized During Treatment: Other (comment) (abdominal binder) Activity Tolerance: Patient tolerated treatment well Patient left: in chair;with call bell/phone within reach Nurse Communication: Mobility status PT Visit Diagnosis: Pain;Difficulty in walking, not elsewhere classified (R26.2) Pain - part of body:  (abdomen)     Time: [id number] PT Time Calculation (min) (ACUTE ONLY): 17 min  Charges:  $Gait Training: 8-22 mins                     Stacie Glaze, PT DPT Acute Rehabilitation Services Pager [phone]  Office [phone]    Roxine Caddy E Ruffin Pyo 11/20/2022, 9:17 AM

## 2022-11-20 NOTE — Progress Notes (Signed)
Overall, I think Mr. Stemarie is doing quite nicely.  He is improving from his surgery.  He is still not passing gas.  When I listen to his abdomen, he does have bowel sounds although they are decreased.  He is out of bed.  He is not have any fevers.  There is no bleeding.  The pathology on his colonoscopy biopsy was adenocarcinoma.  I am sure that the biopsy on the liver will also be adenocarcinoma.  We will have to send off the liver for molecular testing to see if he has any MSI/MMR abnormalities.  There be other molecular test that we need to run.  He clearly will need systemic chemotherapy.  Will have to worry about this in a couple of weeks.  He will need to have a Port-A-Cath placed.  I probably would do this little bit further out so that there is no problems with respect to infection.  At this point, the really not much for [place] to do.  I am glad that he had the surgery.  This will certainly make systemic therapy little bit easier.  It will be interesting to see what his next CEA level is now that the primary has been removed.  Lattie Haw, MD  [facility] 4:32

## 2022-11-20 NOTE — Progress Notes (Signed)
[facility]                                                                               Trevonte Terrasi, is a 47 y.o. male, DOB - Feb 18, 1976, [id number] Admit date - 11/15/2022    Outpatient Primary MD for the patient is [name], [name], PA-C  LOS - 4  days    Brief summary   47 year old male with history of esophageal ulcer but no other medical issues comes to the hospital with rectal bleeding. This started about 2 to 3 days ago. He reports about a 20 pound weight loss over the last several months, and also significant constipation. Otherwise he is healthy, active, and has no known medical problems. Imaging in the ED was concerning for liver mets and possible sigmoid mass. 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 Gen surgery consulted and he is underwent sigmoid colectomy with primary anastomosis and liver biopsy on 11/18/22.  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   CT of the chest and abdomen shows No definite evidence of metastatic disease within the chest.  Two subpleural pulmonary nodules within the right lung, considered of very low likelihood to represent pulmonary metastatic disease. Continued observation on subsequent surveillance CT wouldbe warranted. Numerous large enhancing masses within the liver most compatible with metastases.  Pain improved. Off pCA, passing flatus and recommendations to ambulate out of bed.   Tobacco abuse:  On nicotine patch.   Lung nodules.  Surveillance CT in one year   Mild AKI:  ? Dehydration , poor oral intake. Gentle hydration a Repeat renal parameters show improvement.   Leukocytosis ?reactive.   Estimated body mass index is 29.58 kg/m as calculated from the following:   Height as of this  encounter: 5\' 10"  (1.778 m).   Weight as of this encounter: 93.5 kg.  Code Status: full code.  DVT Prophylaxis:  enoxaparin (LOVENOX) injection 40 mg Start: 11/19/22 1000 SCDs Start: 11/16/22 0959   Level of Care: Level of care: Med-Surg Family Communication: none at bedside.   Disposition Plan:     Remains inpatient appropriate: awaiting path and bowel function.   Procedures:  Sigmoid colectomy, liver biopsy by Dr Georgette Dover  Consultants:   General surgery Surgery.   Antimicrobials:   Anti-infectives (From admission, onward)    Start     Dose/Rate Route Frequency Ordered Stop   11/18/22 0600  cefoTEtan (CEFOTAN) 2 g in sodium chloride 0.9 % 100 mL IVPB        2 g 200 mL/hr over 30 Minutes Intravenous On call to O.R. 11/17/22 1326 11/18/22 0649   11/18/22 0200  piperacillin-tazobactam (ZOSYN) IVPB 3.375 g  Status:  Discontinued       See Hyperspace for full Linked Orders Report.   3.375 g 12.5 mL/hr over 240 Minutes Intravenous Every 8 hours 11/17/22 1908 11/18/22 1505   11/17/22 2000  piperacillin-tazobactam (ZOSYN) IVPB 3.375 g       See Hyperspace for full  Linked Orders Report.   3.375 g 100 mL/hr over 30 Minutes Intravenous  Once 11/17/22 1908 11/17/22 2019        Medications  Scheduled Meds:  acetaminophen  1,000 mg Oral Q8H   Chlorhexidine Gluconate Cloth  6 each Topical Q0600   enoxaparin (LOVENOX) injection  40 mg Subcutaneous Q24H   famotidine  20 mg Oral BID   ketorolac  30 mg Intravenous Q6H   methocarbamol  500 mg Oral Q6H   mupirocin ointment  1 Application Nasal BID   pantoprazole  40 mg Oral Daily   Continuous Infusions:  lactated ringers 100 mL/hr at 11/20/22 0739   PRN Meds:.alum & mag hydroxide-simeth, hydrALAZINE, HYDROmorphone (DILAUDID) injection, ondansetron **OR** ondansetron (ZOFRAN) IV, oxyCODONE    Subjective:   Dallin Darragh was seen and examined today. Passing flatus and ambulating.   Objective:   Vitals:   11/20/22 0427  11/20/22 0536 11/20/22 0739 11/20/22 0749  BP: 116/89   115/84  Pulse: 75   65  Resp: 17  17 16   Temp: 98.1 F (36.7 C)   97.7 F (36.5 C)  TempSrc: Oral   Oral  SpO2: 98%  98% 98%  Weight:  93.5 kg    Height:        Intake/Output Summary (Last 24 hours) at 11/20/2022 1351 Last data filed at 11/20/2022 0656 Gross per 24 hour  Intake 1311.9 ml  Output --  Net 1311.9 ml    Filed Weights   11/15/22 2208 11/20/22 0536  Weight: 91.6 kg 93.5 kg     Exam General exam: Appears calm and comfortable  Respiratory system: Clear to auscultation. Respiratory effort normal. Cardiovascular system: S1 & S2 heard, RRR. No JVD,  Gastrointestinal system: Abdomen is soft, bandaged. Tender. Bs+ Central nervous system: comfortable.  Extremities: Symmetric 5 x 5 power.       Data Reviewed:  I have personally reviewed following labs and imaging studies   CBC Lab Results  Component Value Date   WBC 16.0 (H) 11/19/2022   RBC 4.25 11/19/2022   HGB 12.8 (L) 11/19/2022   HCT 37.2 (L) 11/19/2022   MCV 87.5 11/19/2022   MCH 30.1 11/19/2022   PLT 323 11/19/2022   MCHC 34.4 11/19/2022   RDW 12.2 11/19/2022   LYMPHSABS 1,892 03/19/2016   MONOABS 860 03/19/2016   EOSABS 86 03/19/2016   BASOSABS 0 [id number]     Last metabolic panel Lab Results  Component Value Date   NA 135 11/20/2022   K 4.0 11/20/2022   CL 98 11/20/2022   CO2 28 11/20/2022   BUN 13 11/20/2022   CREATININE 1.15 11/20/2022   GLUCOSE 92 11/20/2022   GFRNONAA >60 11/20/2022   GFRAA >60 03/09/2016   CALCIUM 9.0 11/20/2022   PHOS 2.1 (L) 06/17/2008   PROT 6.3 (L) 11/18/2022   ALBUMIN 2.9 (L) 11/18/2022   BILITOT 1.3 (H) 11/18/2022   ALKPHOS 246 (H) 11/18/2022   AST 80 (H) 11/18/2022   ALT 52 (H) 11/18/2022   ANIONGAP 9 11/20/2022    CBG (last 3)  No results for input(s): "GLUCAP" in the last 72 hours.    Coagulation Profile: Recent Labs  Lab 11/16/22 1256  INR 1.1      Radiology Studies: No  results found.     Hosie Poisson M.D. [facility] 11/20/2022, 1:51 PM  Available via Epic secure chat 7am-7pm After 7 pm, please refer to night coverage provider listed on amion.

## 2022-11-21 LAB — BASIC METABOLIC PANEL
Anion gap: 5 (ref 5–15)
BUN: 11 mg/dL (ref 6–20)
CO2: 27 mmol/L (ref 22–32)
Calcium: 8.9 mg/dL (ref 8.9–10.3)
Chloride: 103 mmol/L (ref 98–111)
Creatinine, Ser: 1.03 mg/dL (ref 0.61–1.24)
GFR, Estimated: >60 mL/min (ref >60)
Glucose, Bld: 85 mg/dL (ref 70–99)
Potassium: 4.1 mmol/L (ref 3.5–5.1)
Sodium: 135 mmol/L (ref 135–145)

## 2022-11-21 LAB — CBC
HCT: 33.8 % — ABNORMAL LOW (ref 39.0–52.0)
Hemoglobin: 11 g/dL — ABNORMAL LOW (ref 13.0–17.0)
MCH: 29.4 pg (ref 26.0–34.0)
MCHC: 32.5 g/dL (ref 30.0–36.0)
MCV: 90.4 fL (ref 80.0–100.0)
Platelets: 275 10*3/uL (ref 150–400)
RBC: 3.74 MIL/uL — ABNORMAL LOW (ref 4.22–5.81)
RDW: 12.3 % (ref 11.5–15.5)
WBC: 8.2 10*3/uL (ref 4.0–10.5)
nRBC: 0 % (ref 0.0–0.2)

## 2022-11-21 MED ORDER — HYDROCORTISONE (PERIANAL) 2.5 % EX CREA: TOPICAL_CREAM | Freq: Three times a day (TID) | CUTANEOUS | Status: DC | PRN

## 2022-11-21 MED ORDER — HYDROMORPHONE HCL 1 MG/ML IJ SOLN
0.5000 mg | Freq: Four times a day (QID) | INTRAMUSCULAR | Status: DC | PRN
  Administered 2022-11-21 – 2022-11-23 (×2): 1 mg via INTRAVENOUS
  Filled 2022-11-21 (×2): qty 1

## 2022-11-21 MED ORDER — DOCUSATE SODIUM 100 MG PO CAPS
100.0000 mg | ORAL_CAPSULE | Freq: Two times a day (BID) | ORAL | Status: DC
  Administered 2022-11-21 – 2022-11-23 (×5): 100 mg via ORAL
  Filled 2022-11-21 (×5): qty 1

## 2022-11-21 MED ORDER — WITCH HAZEL-GLYCERIN EX PADS: MEDICATED_PAD | CUTANEOUS | Status: DC | PRN

## 2022-11-21 NOTE — Progress Notes (Signed)
[facility]                                                                               Richard Bowman, is a 47 y.o. male, DOB - 15-Jul-1976, [id number] Admit date - 11/15/2022    Outpatient Primary MD for the patient is [name], [name], PA-C  LOS - 5  days    Brief summary   47 year old male with history of esophageal ulcer but no other medical issues comes to the hospital with rectal bleeding. This started about 2 to 3 days ago. He reports about a 20 pound weight loss over the last several months, and also significant constipation. Otherwise he is healthy, active, and has no known medical problems. Imaging in the ED was concerning for liver mets and possible sigmoid mass. 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 Gen surgery consulted and he is underwent sigmoid colectomy with primary anastomosis and liver biopsy on 11/18/22.  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   CT of the chest and abdomen shows No definite evidence of metastatic disease within the chest.  Two subpleural pulmonary nodules within the right lung, considered of very low likelihood to represent pulmonary metastatic disease. Continued observation on subsequent surveillance CT wouldbe warranted. Numerous large enhancing masses within the liver most compatible with metastases.  Pain improved. Off pCA, passing flatus and recommendations to ambulate out of bed.  Advance diet as tolerated.  Patient continues to report pain around 6 to 7/10. He is on Toradol 30 mg every 6 hours, tylenol, oxycodone 5 to 10 mg every 4 hours prn, dilaudid 0.5 to 1 mg every 6 hours prn   One BM earlier this morning. Continues to have bloating and heartburn.  He is on PPI and mylanta.    Tobacco  abuse:  On nicotine patch.   Lung nodules.  Surveillance CT in one year   Mild AKI:  ? Dehydration , poor oral intake. Gentle hydration a Repeat renal parameters show improvement.   Leukocytosis ?reactive.   Hemorrhoids On anusol cream.     Estimated body mass index is 29.58 kg/m as calculated from the following:   Height as of this encounter: 5\' 10"  (1.778 m).   Weight as of this encounter: 93.5 kg.  Code Status: full code.  DVT Prophylaxis:  enoxaparin (LOVENOX) injection 40 mg Start: 11/19/22 1000 SCDs Start: 11/16/22 0959   Level of Care: Level of care: Med-Surg Family Communication: none at bedside.   Disposition Plan:     Remains inpatient appropriate: awaiting path and bowel function.   Procedures:  Sigmoid colectomy, liver biopsy by Dr Georgette Dover  Consultants:   General surgery Surgery.   Antimicrobials:   Anti-infectives (From admission, onward)    Start     Dose/Rate Route Frequency Ordered Stop   11/18/22 0600  cefoTEtan (CEFOTAN) 2 g in sodium chloride 0.9 % 100 mL IVPB        2 g 200 mL/hr over 30 Minutes Intravenous  On call to O.R. 11/17/22 1326 11/18/22 0649   11/18/22 0200  piperacillin-tazobactam (ZOSYN) IVPB 3.375 g  Status:  Discontinued       See Hyperspace for full Linked Orders Report.   3.375 g 12.5 mL/hr over 240 Minutes Intravenous Every 8 hours 11/17/22 1908 11/18/22 1505   11/17/22 2000  piperacillin-tazobactam (ZOSYN) IVPB 3.375 g       See Hyperspace for full Linked Orders Report.   3.375 g 100 mL/hr over 30 Minutes Intravenous  Once 11/17/22 1908 11/17/22 2019        Medications  Scheduled Meds:  acetaminophen  1,000 mg Oral Q8H   Chlorhexidine Gluconate Cloth  6 each Topical Q0600   docusate sodium  100 mg Oral BID   enoxaparin (LOVENOX) injection  40 mg Subcutaneous Q24H   famotidine  20 mg Oral BID   ketorolac  30 mg Intravenous Q6H   methocarbamol  500 mg Oral Q6H   mupirocin ointment  1 Application Nasal BID    pantoprazole  40 mg Oral Daily   Continuous Infusions:  lactated ringers 75 mL/hr at 11/21/22 0520   PRN Meds:.alum & mag hydroxide-simeth, hydrALAZINE, hydrocortisone, HYDROmorphone (DILAUDID) injection, ondansetron **OR** ondansetron (ZOFRAN) IV, oxyCODONE, witch hazel-glycerin    Subjective:   Richard Bowman was seen and examined today.  Pain still high at 6 tp 7/10.  BM early this morning. No nausea.   Objective:   Vitals:   11/20/22 1623 11/20/22 2031 11/21/22 0540 11/21/22 0734  BP: 118/74 119/78 116/83 135/67  Pulse: 73 74 (!) 59 (!) 55  Resp: 16 17 16 18   Temp: 98.3 F (36.8 C) 98.4 F (36.9 C)  98.1 F (36.7 C)  TempSrc: Oral Oral  Oral  SpO2: 97% 99% 95% 95%  Weight:      Height:       No intake or output data in the 24 hours ending 11/21/22 1435  Filed Weights   11/15/22 2208 11/20/22 0536  Weight: 91.6 kg 93.5 kg     Exam General exam: Appears calm and comfortable  Respiratory system: Clear to auscultation. Respiratory effort normal. Cardiovascular system: S1 & S2 heard, RRR. No JVD, murmurs, Gastrointestinal system: Abdomen is soft, tender, bs+ Central nervous system: Alert and oriented. No focal neurological deficits. Extremities: Symmetric 5 x 5 power. Skin: No rashes,  Psychiatry: Mood & affect appropriate.         Data Reviewed:  I have personally reviewed following labs and imaging studies   CBC Lab Results  Component Value Date   WBC 8.2 11/21/2022   RBC 3.74 (L) 11/21/2022   HGB 11.0 (L) 11/21/2022   HCT 33.8 (L) 11/21/2022   MCV 90.4 11/21/2022   MCH 29.4 11/21/2022   PLT 275 11/21/2022   MCHC 32.5 11/21/2022   RDW 12.3 11/21/2022   LYMPHSABS 1,892 03/19/2016   MONOABS 860 03/19/2016   EOSABS 86 03/19/2016   BASOSABS 0 [id number]     Last metabolic panel Lab Results  Component Value Date   NA 135 11/21/2022   K 4.1 11/21/2022   CL 103 11/21/2022   CO2 27 11/21/2022   BUN 11 11/21/2022   CREATININE 1.03  11/21/2022   GLUCOSE 85 11/21/2022   GFRNONAA >60 11/21/2022   GFRAA >60 03/09/2016   CALCIUM 8.9 11/21/2022   PHOS 2.1 (L) 06/17/2008   PROT 6.3 (L) 11/18/2022   ALBUMIN 2.9 (L) 11/18/2022   BILITOT 1.3 (H) 11/18/2022   ALKPHOS 246 (H) 11/18/2022   AST  80 (H) 11/18/2022   ALT 52 (H) 11/18/2022   ANIONGAP 5 11/21/2022    CBG (last 3)  No results for input(Bowman): "GLUCAP" in the last 72 hours.    Coagulation Profile: Recent Labs  Lab 11/16/22 1256  INR 1.1      Radiology Studies: No results found.     Hosie Poisson M.D. [facility] 11/21/2022, 2:35 PM  Available via Epic secure chat 7am-7pm After 7 pm, please refer to night coverage provider listed on amion.

## 2022-11-21 NOTE — Progress Notes (Signed)
[facility] Progress Note  3 Days Post-Op  Subjective: CC-  Up in chair. Abdominal pain well controlled. Only used dilaudid once yesterday. Some bloating/burping but denies any n/v. Passing more flatus and had 2 small Bms yesterday, and 1 moderate loose stool this morning. Tolerating full liquids.  Complaining of hemorrhoids, which were present and swollen prior to admission.  Objective: Vital signs in last 24 hours: Temp:  [98.1 F (36.7 C)-98.4 F (36.9 C)] 98.1 F (36.7 C) (03/23 0734) Pulse Rate:  [55-74] 55 (03/23 0734) Resp:  [16-18] 18 (03/23 0734) BP: (116-135)/(67-83) 135/67 (03/23 0734) SpO2:  [95 %-99 %] 95 % (03/23 0734) Last BM Date : 11/19/22  Intake/Output from previous day: No intake/output data recorded. Intake/Output this shift: No intake/output data recorded.  PE: Gen:  Alert, NAD, pleasant Abd - soft, mild distention, appropriately tender/ incisional tenderness, honeycomb to midline incision with small amount of dried blood  Lab Results:  Recent Labs    11/19/22 0322 11/21/22 0138  WBC 16.0* 8.2  HGB 12.8* 11.0*  HCT 37.2* 33.8*  PLT 323 275   BMET Recent Labs    11/20/22 0935 11/21/22 0138  NA 135 135  K 4.0 4.1  CL 98 103  CO2 28 27  GLUCOSE 92 85  BUN 13 11  CREATININE 1.15 1.03  CALCIUM 9.0 8.9   PT/INR No results for input(s): "LABPROT", "INR" in the last 72 hours. CMP     Component Value Date/Time   NA 135 11/21/2022 0138   K 4.1 11/21/2022 0138   CL 103 11/21/2022 0138   CO2 27 11/21/2022 0138   GLUCOSE 85 11/21/2022 0138   BUN 11 11/21/2022 0138   CREATININE 1.03 11/21/2022 0138   CALCIUM 8.9 11/21/2022 0138   PROT 6.3 (L) 11/18/2022 0324   ALBUMIN 2.9 (L) 11/18/2022 0324   AST 80 (H) 11/18/2022 0324   ALT 52 (H) 11/18/2022 0324   ALKPHOS 246 (H) 11/18/2022 0324   BILITOT 1.3 (H) 11/18/2022 0324   GFRNONAA >60 11/21/2022 0138   GFRAA >60 03/09/2016 0203   Lipase     Component Value Date/Time    LIPASE 35 11/15/2022 2208       Studies/Results: No results found.  Anti-infectives: Anti-infectives (From admission, onward)    Start     Dose/Rate Route Frequency Ordered Stop   11/18/22 0600  cefoTEtan (CEFOTAN) 2 g in sodium chloride 0.9 % 100 mL IVPB        2 g 200 mL/hr over 30 Minutes Intravenous On call to O.R. 11/17/22 1326 11/18/22 0649   11/18/22 0200  piperacillin-tazobactam (ZOSYN) IVPB 3.375 g  Status:  Discontinued       See Hyperspace for full Linked Orders Report.   3.375 g 12.5 mL/hr over 240 Minutes Intravenous Every 8 hours 11/17/22 1908 11/18/22 1505   11/17/22 2000  piperacillin-tazobactam (ZOSYN) IVPB 3.375 g       See Hyperspace for full Linked Orders Report.   3.375 g 100 mL/hr over 30 Minutes Intravenous  Once 11/17/22 1908 11/17/22 2019        Assessment/Plan Near obstructing sigmoid mass Multiple liver lesions - CT a/p shows numerous large enhancing masses within the liver most compatible with metastases; primary source not clearly visualized but there is abnormal soft tissue in the mesentery leading to the sigmoid colon which is concerning for adenopathy - CT chest with no definite evidence of metastatic disease within the chest - CEA 140 - Colonoscopy revealed a malignant-  appearing, intrinsic severe stenosis in the sigmoid colon that was non- traversed; an infiltrative near obstructing large friable mass was found in the sigmoid colon, the mass was circumferential, no bleeding was present, biopsies were taken, distal fold area 25cm from anal verge was tattooed. 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    POD#3 Sigmoid colectomy with primary anastomosis/ liver biopsy - 11/18/22 - Tsuei -Pathology pending. Oncology following -Advance to soft diet -Encourage ambulation - PT recommending no follow up -Abdominal binder -Plan to remove honeycomb dressing tomorrow 3/24 -Add anusol  cream and tucks pads PRN hemorrhoids  -Pain: Scheduled tylenol 1,000 mg q8h, robaxin 500 mg q 6h, and toradol. oxycodone 5-10 MG q 4h PRN. IV dilaudid q 3h PRN for breakthrough. -May be ready for discharge in 1-2 days   ID - none VTE - SCDs, per primary FEN - IVF per TRH, soft diet, ensure  Foley - none   Tobacco abuse    LOS: 5 days    Richard Bowman, [facility] 11/21/2022, 9:01 AM Please see Amion for pager number during day hours 7:00am-4:30pm

## 2022-11-21 NOTE — Progress Notes (Signed)
Mobility Specialist Progress Note   11/21/22 1117  Mobility  Activity Ambulated with assistance in hallway  Level of Assistance Standby assist, set-up cues, supervision of patient - no hands on  Assistive Device Front wheel walker  Distance Ambulated (ft) 1000 ft  Activity Response Tolerated well  Mobility Referral Yes  $Mobility charge 1 Mobility   Received pt in chair c/o abdominal pain(7/10) but agreeable. Ambulated in hallway w/ a steady gait but pain still present. Returned back to chair w/o fault and all needs met.   Holland Falling Mobility Specialist Please contact via SecureChat or  Rehab office at [phone]

## 2022-11-22 MED ORDER — DOCUSATE SODIUM 100 MG PO CAPS: 100.0000 mg | ORAL_CAPSULE | Freq: Two times a day (BID) | ORAL | 0 refills | Status: DC

## 2022-11-22 MED ORDER — POLYETHYLENE GLYCOL 3350 17 G PO PACK
17.0000 g | PACK | Freq: Every day | ORAL | Status: DC
  Administered 2022-11-22 – 2022-11-23 (×2): 17 g via ORAL
  Filled 2022-11-22 (×2): qty 1

## 2022-11-22 MED ORDER — ACETAMINOPHEN 500 MG PO TABS: 1000.0000 mg | ORAL_TABLET | Freq: Four times a day (QID) | ORAL | 0 refills | Status: AC | PRN

## 2022-11-22 MED ORDER — IBUPROFEN 200 MG PO TABS: 600.0000 mg | ORAL_TABLET | Freq: Four times a day (QID) | ORAL | Status: DC | PRN

## 2022-11-22 MED ORDER — METHOCARBAMOL 500 MG PO TABS: 500.0000 mg | ORAL_TABLET | Freq: Four times a day (QID) | ORAL | 0 refills | Status: DC | PRN

## 2022-11-22 MED ORDER — OXYCODONE HCL 10 MG PO TABS: 5.0000 mg | ORAL_TABLET | Freq: Four times a day (QID) | ORAL | 0 refills | Status: DC | PRN

## 2022-11-22 MED ORDER — ENSURE ENLIVE PO LIQD
237.0000 mL | Freq: Two times a day (BID) | ORAL | Status: DC
  Administered 2022-11-22 – 2022-11-23 (×3): 237 mL via ORAL

## 2022-11-22 MED ORDER — POLYETHYLENE GLYCOL 3350 17 G PO PACK: 17.0000 g | PACK | Freq: Every day | ORAL | 0 refills | Status: DC | PRN

## 2022-11-22 NOTE — Progress Notes (Signed)
[facility] Progress Note  4 Days Post-Op  Subjective: CC-  States that after dinner last night he had a little more abdominal discomfort. Feels gas moving in his abdomen but has not passed any more. Denies worsening bloating, nausea, or vomiting. Has not been OOB yet this morning.  Objective: Vital signs in last 24 hours: Temp:  [97.8 F (36.6 C)-98.4 F (36.9 C)] 97.8 F (36.6 C) (03/24 0752) Pulse Rate:  [58-66] 65 (03/24 0752) Resp:  [16-18] 16 (03/24 0752) BP: (127-141)/(64-87) 127/64 (03/24 0752) SpO2:  [96 %-98 %] 98 % (03/24 0752) Last BM Date : 11/21/22  Intake/Output from previous day: 03/23 0701 - 03/24 0700 In: 300 [P.O.:300] Out: -  Intake/Output this shift: No intake/output data recorded.  PE: Gen:  Alert, NAD, pleasant Abd - soft, mild distention, appropriately tender/ incisional tenderness, midline incision cdi with staples present and no erythema or drainage  Lab Results:  Recent Labs    11/21/22 0138  WBC 8.2  HGB 11.0*  HCT 33.8*  PLT 275   BMET Recent Labs    11/20/22 0935 11/21/22 0138  NA 135 135  K 4.0 4.1  CL 98 103  CO2 28 27  GLUCOSE 92 85  BUN 13 11  CREATININE 1.15 1.03  CALCIUM 9.0 8.9   PT/INR No results for input(s): "LABPROT", "INR" in the last 72 hours. CMP     Component Value Date/Time   NA 135 11/21/2022 0138   K 4.1 11/21/2022 0138   CL 103 11/21/2022 0138   CO2 27 11/21/2022 0138   GLUCOSE 85 11/21/2022 0138   BUN 11 11/21/2022 0138   CREATININE 1.03 11/21/2022 0138   CALCIUM 8.9 11/21/2022 0138   PROT 6.3 (L) 11/18/2022 0324   ALBUMIN 2.9 (L) 11/18/2022 0324   AST 80 (H) 11/18/2022 0324   ALT 52 (H) 11/18/2022 0324   ALKPHOS 246 (H) 11/18/2022 0324   BILITOT 1.3 (H) 11/18/2022 0324   GFRNONAA >60 11/21/2022 0138   GFRAA >60 03/09/2016 0203   Lipase     Component Value Date/Time   LIPASE 35 11/15/2022 2208       Studies/Results: No results found.  Anti-infectives: Anti-infectives  (From admission, onward)    Start     Dose/Rate Route Frequency Ordered Stop   11/18/22 0600  cefoTEtan (CEFOTAN) 2 g in sodium chloride 0.9 % 100 mL IVPB        2 g 200 mL/hr over 30 Minutes Intravenous On call to O.R. 11/17/22 1326 11/18/22 0649   11/18/22 0200  piperacillin-tazobactam (ZOSYN) IVPB 3.375 g  Status:  Discontinued       See Hyperspace for full Linked Orders Report.   3.375 g 12.5 mL/hr over 240 Minutes Intravenous Every 8 hours 11/17/22 1908 11/18/22 1505   11/17/22 2000  piperacillin-tazobactam (ZOSYN) IVPB 3.375 g       See Hyperspace for full Linked Orders Report.   3.375 g 100 mL/hr over 30 Minutes Intravenous  Once 11/17/22 1908 11/17/22 2019        Assessment/Plan Near obstructing sigmoid mass Multiple liver lesions - CT a/p shows numerous large enhancing masses within the liver most compatible with metastases; primary source not clearly visualized but there is abnormal soft tissue in the mesentery leading to the sigmoid colon which is concerning for adenopathy - CT chest with no definite evidence of metastatic disease within the chest - CEA 140 - Colonoscopy revealed a malignant- appearing, intrinsic severe stenosis in the sigmoid colon that  was non- traversed; an infiltrative near obstructing large friable mass was found in the sigmoid colon, the mass was circumferential, no bleeding was present, biopsies were taken, distal fold area 25cm from anal verge was tattooed. 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    POD#4 Sigmoid colectomy with primary anastomosis/ liver biopsy - 11/18/22 - Tsuei -Pathology pending. Oncology following -Encourage ambulation - PT recommending no follow up -Abdominal binder -Overall doing well. If tolerating breakfast and starts passing flatus again once mobilizing this morning, and if he feels better then ok for discharge later today from surgical standpoint. If he  still doesn't feel great then would recommend staying until tomorrow. Discharge instructions and follow up info on AVS. I will send prescriptions for pain medication to his pharmacy.   ID - none VTE - SCDs, per primary FEN - IVF per TRH, soft diet, ensure  Foley - none   Tobacco abuse    LOS: 6 days    Wellington Hampshire, [facility] 11/22/2022, 8:36 AM Please see Amion for pager number during day hours 7:00am-4:30pm

## 2022-11-22 NOTE — Progress Notes (Signed)
[facility]                                                                               Richard Bowman, is a 47 y.o. male, DOB - 1976/08/13, [id number] Admit date - 11/15/2022    Outpatient Primary MD for the patient is [name], [name], PA-C  LOS - 6  days    Brief summary   47 year old male with history of esophageal ulcer but no other medical issues comes to the hospital with rectal bleeding. This started about 2 to 3 days ago. He reports about a 20 pound weight loss over the last several months, and also significant constipation. Otherwise he is healthy, active, and has no known medical problems. Imaging in the ED was concerning for liver mets and possible sigmoid mass. 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 Gen surgery consulted and he is underwent sigmoid colectomy with primary anastomosis and liver biopsy on 11/18/22.  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   CT of the chest and abdomen shows No definite evidence of metastatic disease within the chest.  Two subpleural pulmonary nodules within the right lung, considered of very low likelihood to represent pulmonary metastatic disease. Continued observation on subsequent surveillance CT wouldbe warranted. Numerous large enhancing masses within the liver most compatible with metastases.  Pain improved. Off pCA, passing flatus and recommendations to ambulate out of bed.  Advance diet as tolerated.  Patient continues to report pain around 6 to 7/10. He is on Toradol 30 mg every 6 hours, tylenol, oxycodone 5 to 10 mg every 4 hours prn, dilaudid 0.5 to 1 mg every 6 hours prn   No BM today, advanced diet to regular today, monitor overnight and possible d/c in am.  He is on PPI and mylanta.     Tobacco abuse:  On nicotine patch.   Lung nodules.  Surveillance CT in one year   Mild AKI:  Resolved.   Leukocytosis ?reactive. Resolved.   Hemorrhoids On anusol cream.     Estimated body mass index is 29.96 kg/m as calculated from the following:   Height as of this encounter: 5\' 10"  (1.778 m).   Weight as of this encounter: 94.7 kg.  Code Status: full code.  DVT Prophylaxis:  enoxaparin (LOVENOX) injection 40 mg Start: 11/19/22 1000 SCDs Start: 11/16/22 0959   Level of Care: Level of care: Med-Surg Family Communication: none at bedside.   Disposition Plan:     Remains inpatient appropriate: awaiting path and bowel function.   Procedures:  Sigmoid colectomy, liver biopsy by Dr Georgette Dover  Consultants:   General surgery Surgery.   Antimicrobials:   Anti-infectives (From admission, onward)    Start     Dose/Rate Route Frequency Ordered Stop   11/18/22 0600  cefoTEtan (CEFOTAN) 2 g in sodium chloride 0.9 % 100 mL IVPB        2 g 200 mL/hr over 30 Minutes Intravenous On call to O.R. 11/17/22 1326 11/18/22 [id number]  11/18/22 0200  piperacillin-tazobactam (ZOSYN) IVPB 3.375 g  Status:  Discontinued       See Hyperspace for full Linked Orders Report.   3.375 g 12.5 mL/hr over 240 Minutes Intravenous Every 8 hours 11/17/22 1908 11/18/22 1505   11/17/22 2000  piperacillin-tazobactam (ZOSYN) IVPB 3.375 g       See Hyperspace for full Linked Orders Report.   3.375 g 100 mL/hr over 30 Minutes Intravenous  Once 11/17/22 1908 11/17/22 2019        Medications  Scheduled Meds:  acetaminophen  1,000 mg Oral Q8H   Chlorhexidine Gluconate Cloth  6 each Topical Q0600   docusate sodium  100 mg Oral BID   enoxaparin (LOVENOX) injection  40 mg Subcutaneous Q24H   famotidine  20 mg Oral BID   feeding supplement  237 mL Oral BID BM   ketorolac  30 mg Intravenous Q6H   methocarbamol  500 mg Oral Q6H   mupirocin ointment  1 Application Nasal BID   pantoprazole  40 mg Oral  Daily   polyethylene glycol  17 g Oral Daily   Continuous Infusions:   PRN Meds:.alum & mag hydroxide-simeth, hydrALAZINE, hydrocortisone, HYDROmorphone (DILAUDID) injection, ondansetron **OR** ondansetron (ZOFRAN) IV, oxyCODONE, witch hazel-glycerin    Subjective:   Richard Bowman was seen and examined today.  Pain is better controlled. No nausea or vomiting. Lot of belching and bloating.   Objective:   Vitals:   11/21/22 1559 11/21/22 1955 11/22/22 0442 11/22/22 0752  BP: 128/81 (!) 141/87 131/84 127/64  Pulse: 61 66 (!) 58 65  Resp: 18 17 17 16   Temp: 98.3 F (36.8 C) 98.4 F (36.9 C) 98 F (36.7 C) 97.8 F (36.6 C)  TempSrc: Oral Oral  Oral  SpO2: 98% 96% 96% 98%  Weight:      Height:        Intake/Output Summary (Last 24 hours) at 11/22/2022 1434 Last data filed at 11/22/2022 0420 Gross per 24 hour  Intake 300 ml  Output --  Net 300 ml    Filed Weights   11/15/22 2208 11/20/22 0536 11/21/22 0734  Weight: 91.6 kg 93.5 kg 94.7 kg     Exam General exam: Appears calm and comfortable  Respiratory system: Clear to auscultation. Respiratory effort normal. Cardiovascular system: S1 & S2 heard, RRR. No JVD, Gastrointestinal system: Abdomen is nondistended, soft and nontender.  Central nervous system: Alert and oriented. No focal neurological deficits. Extremities: Symmetric 5 x 5 power. Skin: No rashes, Psychiatry:  Mood & affect appropriate.         Data Reviewed:  I have personally reviewed following labs and imaging studies   CBC Lab Results  Component Value Date   WBC 8.2 11/21/2022   RBC 3.74 (L) 11/21/2022   HGB 11.0 (L) 11/21/2022   HCT 33.8 (L) 11/21/2022   MCV 90.4 11/21/2022   MCH 29.4 11/21/2022   PLT 275 11/21/2022   MCHC 32.5 11/21/2022   RDW 12.3 11/21/2022   LYMPHSABS 1,892 03/19/2016   MONOABS 860 03/19/2016   EOSABS 86 03/19/2016   BASOSABS 0 [id number]     Last metabolic panel Lab Results  Component Value Date   NA  135 11/21/2022   K 4.1 11/21/2022   CL 103 11/21/2022   CO2 27 11/21/2022   BUN 11 11/21/2022   CREATININE 1.03 11/21/2022   GLUCOSE 85 11/21/2022   GFRNONAA >60 11/21/2022   GFRAA >60 03/09/2016   CALCIUM 8.9 11/21/2022   PHOS 2.1 (L)  06/17/2008   PROT 6.3 (L) 11/18/2022   ALBUMIN 2.9 (L) 11/18/2022   BILITOT 1.3 (H) 11/18/2022   ALKPHOS 246 (H) 11/18/2022   AST 80 (H) 11/18/2022   ALT 52 (H) 11/18/2022   ANIONGAP 5 11/21/2022    CBG (last 3)  No results for input(s): "GLUCAP" in the last 72 hours.    Coagulation Profile: Recent Labs  Lab 11/16/22 1256  INR 1.1      Radiology Studies: No results found.     Hosie Poisson M.D. [facility] 11/22/2022, 2:34 PM  Available via Epic secure chat 7am-7pm After 7 pm, please refer to night coverage provider listed on amion.

## 2022-11-23 ENCOUNTER — Other Ambulatory Visit (HOSPITAL_COMMUNITY): Payer: Self-pay

## 2022-11-23 ENCOUNTER — Telehealth: Payer: Self-pay

## 2022-11-23 DIAGNOSIS — R935 Abnormal findings on diagnostic imaging of other abdominal regions, including retroperitoneum: Secondary | ICD-10-CM

## 2022-11-23 MED ORDER — ONDANSETRON HCL 4 MG PO TABS: 4.0000 mg | ORAL_TABLET | Freq: Four times a day (QID) | ORAL | 0 refills | Status: DC | PRN

## 2022-11-23 MED ORDER — METHOCARBAMOL 500 MG PO TABS
500.0000 mg | ORAL_TABLET | Freq: Four times a day (QID) | ORAL | 0 refills | Status: DC | PRN
  Filled 2022-11-23: qty 30, 8d supply, fill #0

## 2022-11-23 MED ORDER — OXYCODONE HCL 10 MG PO TABS
5.0000 mg | ORAL_TABLET | Freq: Four times a day (QID) | ORAL | 0 refills | Status: DC | PRN
  Filled 2022-11-23: qty 20, 5d supply, fill #0

## 2022-11-23 MED ORDER — HYDROCORTISONE (PERIANAL) 2.5 % EX CREA
TOPICAL_CREAM | Freq: Three times a day (TID) | CUTANEOUS | 0 refills | Status: DC | PRN
  Filled 2022-11-23: qty 30, 30d supply, fill #0

## 2022-11-23 MED ORDER — DOCUSATE SODIUM 100 MG PO CAPS: 100.0000 mg | ORAL_CAPSULE | Freq: Two times a day (BID) | ORAL | 0 refills | Status: DC

## 2022-11-23 MED ORDER — HYDROCORTISONE (PERIANAL) 2.5 % EX CREA: TOPICAL_CREAM | Freq: Three times a day (TID) | CUTANEOUS | 0 refills | Status: DC | PRN

## 2022-11-23 MED ORDER — ENSURE ENLIVE PO LIQD: 237.0000 mL | Freq: Two times a day (BID) | ORAL | 12 refills | Status: DC

## 2022-11-23 MED ORDER — ONDANSETRON HCL 4 MG PO TABS
4.0000 mg | ORAL_TABLET | Freq: Four times a day (QID) | ORAL | 0 refills | Status: DC | PRN
  Filled 2022-11-23: qty 20, 5d supply, fill #0

## 2022-11-23 MED ORDER — NICOTINE 21 MG/24HR TD PT24
21.0000 mg | MEDICATED_PATCH | TRANSDERMAL | 1 refills | Status: AC
  Filled 2022-11-23: qty 30, 30d supply, fill #0

## 2022-11-23 MED ORDER — POLYETHYLENE GLYCOL 3350 17 G PO PACK: 17.0000 g | PACK | Freq: Every day | ORAL | 0 refills | Status: DC | PRN

## 2022-11-23 NOTE — Progress Notes (Signed)
[facility] Progress Note  5 Days Post-Op  Subjective: CC-  Pain localized to the inferior portion of his incision. Worse with movement and when it get's caught on clothing. Discussed how to prevent this. Pain well controlled on po medications. Did require IV pain medication x 1 overnight but felt this was because he fell behind on prn meds. Tolerating soft diet and finishing 100% of trays. Taking it slow and eating meals over the course of 1 hour. No n/v. Burping/belching also seem to be better. Passing flatus. Formed bm yesterday. Mobilizing well. Voiding. Does have some pain with initiation of void. UA ordered.   Objective: Vital signs in last 24 hours: Temp:  [97.8 F (36.6 C)-98.7 F (37.1 C)] 97.8 F (36.6 C) (03/25 0747) Pulse Rate:  [53-62] 53 (03/25 0747) Resp:  [15-18] 18 (03/25 0747) BP: (114-133)/(73-88) 130/76 (03/25 0747) SpO2:  [96 %-98 %] 97 % (03/25 0747) Weight:  [92.2 kg] 92.2 kg (03/25 0500) Last BM Date : 11/21/22  Intake/Output from previous day: No intake/output data recorded. Intake/Output this shift: No intake/output data recorded.  PE: Gen:  Alert, NAD, pleasant Abd - Soft, ND, appropriately tender/ incisional tenderness, midline incision cdi with staples present and no erythema or drainage  Lab Results:  Recent Labs    11/21/22 0138  WBC 8.2  HGB 11.0*  HCT 33.8*  PLT 275    BMET Recent Labs    11/21/22 0138  NA 135  K 4.1  CL 103  CO2 27  GLUCOSE 85  BUN 11  CREATININE 1.03  CALCIUM 8.9    PT/INR No results for input(s): "LABPROT", "INR" in the last 72 hours. CMP     Component Value Date/Time   NA 135 11/21/2022 0138   K 4.1 11/21/2022 0138   CL 103 11/21/2022 0138   CO2 27 11/21/2022 0138   GLUCOSE 85 11/21/2022 0138   BUN 11 11/21/2022 0138   CREATININE 1.03 11/21/2022 0138   CALCIUM 8.9 11/21/2022 0138   PROT 6.3 (L) 11/18/2022 0324   ALBUMIN 2.9 (L) 11/18/2022 0324   AST 80 (H) 11/18/2022 0324   ALT 52  (H) 11/18/2022 0324   ALKPHOS 246 (H) 11/18/2022 0324   BILITOT 1.3 (H) 11/18/2022 0324   GFRNONAA >60 11/21/2022 0138   GFRAA >60 03/09/2016 0203   Lipase     Component Value Date/Time   LIPASE 35 11/15/2022 2208       Studies/Results: No results found.  Anti-infectives: Anti-infectives (From admission, onward)    Start     Dose/Rate Route Frequency Ordered Stop   11/18/22 0600  cefoTEtan (CEFOTAN) 2 g in sodium chloride 0.9 % 100 mL IVPB        2 g 200 mL/hr over 30 Minutes Intravenous On call to O.R. 11/17/22 1326 11/18/22 0649   11/18/22 0200  piperacillin-tazobactam (ZOSYN) IVPB 3.375 g  Status:  Discontinued       See Hyperspace for full Linked Orders Report.   3.375 g 12.5 mL/hr over 240 Minutes Intravenous Every 8 hours 11/17/22 1908 11/18/22 1505   11/17/22 2000  piperacillin-tazobactam (ZOSYN) IVPB 3.375 g       See Hyperspace for full Linked Orders Report.   3.375 g 100 mL/hr over 30 Minutes Intravenous  Once 11/17/22 1908 11/17/22 2019        Assessment/Plan Near obstructing sigmoid mass Multiple liver lesions - CT a/p shows numerous large enhancing masses within the liver most compatible with metastases; primary source not clearly  visualized but there is abnormal soft tissue in the mesentery leading to the sigmoid colon which is concerning for adenopathy - CT chest with no definite evidence of metastatic disease within the chest - CEA 140 - Colonoscopy revealed a malignant- appearing, intrinsic severe stenosis in the sigmoid colon that was non- traversed; an infiltrative near obstructing large friable mass was found in the sigmoid colon, the mass was circumferential, no bleeding was present, biopsies were taken, distal fold area 25cm from anal verge was tattooed. 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    POD#5 Sigmoid colectomy with primary anastomosis/ liver biopsy - 11/18/22 - Tsuei -  Pathology with invasive, moderately differentiated adenocarcinoma, 5.5 cm.  Carcinoma invades through the serosa with lymphovascular and perineural invasion present. 4/18 lymph nodes positive. All margins negative for invasive carcinoma. Liver bx c/w metastatic adenocarcinoma. pT4a, pN2a   - Pathology was discussed with patient. Oncology, Dr. [name] following. Will need f/u with his group. They are recommending port a cath placement a little further out.  - Tolerating diet and having bowel fct.  - Encourage ambulation - PT recommending no follow up - Abdominal binder when oob - Pulm toilet.  - No labs done today to review.   Patient is okay for discharge from a general surgery standpoint.  I have communicated this with the primary team.  Pain medication sent to his pharmacy.  Follow-up as been arranged. Discussed discharge instructions, restrictions and return/call back precautions.    ID - none VTE - SCDs, per primary FEN - IVF per TRH, soft diet, ensure  Foley - none. Check UA.    Tobacco abuse    LOS: 7 days    Jillyn Ledger, [facility] 11/23/2022, 9:35 AM Please see Amion for pager number during day hours 7:00am-4:30pm

## 2022-11-23 NOTE — Telephone Encounter (Signed)
Copied from [place] [id number]. Topic: Appointment Scheduling - Scheduling Inquiry for Clinic >> Nov 23, 2022  1:54 PM Marcellus Scott wrote: Reason for CRM: Sharyn Lull Case Manager stated the patient has colon and liver cancer, and she would like to ask if a hospital follow-up could be given to pt. No appointment is available within the next 7-14 days. I explained to Sharyn Lull that pt has missed his new patient appointments, so he is not established. However, she asked if it would be possible to see him since he has seen [name], Cari in office.   Sharyn Lull said she would callback. Declined to provide a callback number.   Please advise.   Appt scheduled

## 2022-11-23 NOTE — Progress Notes (Signed)
Mobility Specialist - Progress Note   11/23/22 1222  Mobility  Activity Ambulated with assistance in hallway  Level of Assistance Standby assist, set-up cues, supervision of patient - no hands on  Assistive Device None  Distance Ambulated (ft) 500 ft  Activity Response Tolerated well  Mobility Referral Yes  $Mobility charge 1 Mobility   Pt received EOB and agreeable. No complaints throughout. Pt returned to bed with all needs met.  Franki Monte  Mobility Specialist Please contact via Solicitor or Rehab office at [phone]

## 2022-11-23 NOTE — Discharge Summary (Signed)
Physician Discharge Summary   Patient: Richard Bowman MRN: [id number] DOB: 04/16/1976  Admit date:     11/15/2022  Discharge date: 11/23/22  Discharge Physician: Richard Bowman   PCP: Richard Rad, PA-C   Recommendations at discharge:   Please follow up with Richard Bowman as recommended.  Please follow up with PCP in one week.  Please follow up with general surgery as recommended.    Discharge Diagnoses: Principal Problem:   Rectal bleeding Active Problems:   Smoker   Transaminitis   Generalized abdominal pain   Metastasis to liver (HCC)   Abnormal CT of the abdomen   Mass of colon   Hospital Course:  47 year old male with history of esophageal ulcer but no other medical issues comes to the hospital with rectal bleeding. This started about 2 to 3 days ago. He reports about a 20 pound weight loss over the last several months, and also significant constipation. Otherwise he is healthy, active, and has no known medical problems. Imaging in the ED was concerning for liver mets and possible sigmoid mass. 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 Gen surgery consulted and he is underwent sigmoid colectomy with primary anastomosis and liver biopsy on 11/18/22.  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   CT of the chest and abdomen shows No definite evidence of metastatic disease within the chest.  Two subpleural pulmonary nodules within the right lung, considered of very low likelihood to represent pulmonary metastatic disease. Continued observation on subsequent surveillance CT wouldbe warranted. Numerous large enhancing masses within the liver most compatible with metastases.   Pain improved. Off pCA, passing flatus and recommendations to ambulate out of bed.  Advance diet as  tolerated.  Patient continues to report pain around 6 to 7/10. He is on Toradol 30 mg every 6 hours, tylenol, oxycodone 5 to 10 mg every 4 hours prn, dilaudid 0.5 to 1 mg every 6 hours prn    No BM today, advanced diet to regular today, monitor overnight and possible d/c in am.  He is on PPI and mylanta.      Tobacco abuse:  On nicotine patch.    Lung nodules.  Surveillance CT in one year    Mild AKI:  Resolved.    Leukocytosis ?reactive. Resolved.    Hemorrhoids On anusol cream.        Estimated body mass index is 29.96 kg/m as calculated from the following:   Height as of this encounter: 5\' 10"  (1.778 m).   Weight as of this encounter: 94.7 kg.         Consultants: oncology Gen surgery.  Procedures performed:  sigmoid colectomy with primary anastomosis and liver biopsy on 11/18/22. Disposition: Home Diet recommendation:  Discharge Diet Orders (From admission, onward)     Start     Ordered   11/23/22 0000  Diet - low sodium heart healthy        11/23/22 1211           Regular diet DISCHARGE MEDICATION: Allergies as of 11/23/2022   No Known Allergies      Medication List     STOP taking these medications    gabapentin 100 MG capsule Commonly known as: NEURONTIN       TAKE these medications    acetaminophen 500 MG tablet Commonly known  as: TYLENOL Take 2 tablets (1,000 mg total) by mouth every 6 (six) hours as needed for mild pain.   docusate sodium 100 MG capsule Commonly known as: COLACE Take 1 capsule (100 mg total) by mouth 2 (two) times daily.   feeding supplement Liqd Take 237 mLs by mouth 2 (two) times daily between meals.   hydrocortisone 2.5 % rectal cream Commonly known as: ANUSOL-HC Place rectally 3 (three) times daily as needed for hemorrhoids or anal itching.   ibuprofen 200 MG tablet Commonly known as: ADVIL Take 3 tablets (600 mg total) by mouth every 6 (six) hours as needed for moderate pain. What changed:   medication strength when to take this reasons to take this   methocarbamol 500 MG tablet Commonly known as: ROBAXIN Take 1 tablet (500 mg total) by mouth every 6 (six) hours as needed for muscle spasms.   nicotine 21 mg/24hr patch Commonly known as: NICODERM CQ - dosed in mg/24 hours Place 1 patch (21 mg total) onto the skin daily.   ondansetron 4 MG tablet Commonly known as: ZOFRAN Take 1 tablet (4 mg total) by mouth every 6 (six) hours as needed for nausea.   Oxycodone HCl 10 MG Tabs Take 0.5-1 tablets (5-10 mg total) by mouth every 6 (six) hours as needed.   polyethylene glycol 17 g packet Commonly known as: MIRALAX / GLYCOLAX Take 17 g by mouth daily as needed for mild constipation.        Follow-up Information     [name], Loraine Grip, PA-C. Schedule an appointment as soon as possible for a visit.   Specialty: Physician Assistant Why: You are scheduled for a post-hospitalization visit at the clinic tomorrow 11/24/22.  Please follow up with the clinc for your appointment. Contact information: [address] [place] [address] [phone]         Richard Mesa, MD. Go on 12/15/2022.   Specialty: General Surgery Why: Your appointment is 12/15/22 @ 3:50pm Please arrive 15 minutes early to check in Contact information: [place] [address] [phone]         [facility], [place]. Go on 12/02/2022.   Specialty: General Surgery Why: Your appointment is 12/02/22 @ 9:30am with a nurse for staple removal., Arrive 61min early to check in, fill out paperwork, Bring photo ID and insurance information Contact information: [address] [place] [phone]        Richard Napoleon, MD. Schedule an appointment as soon as possible for a visit.   Specialty: Oncology Why: For follow up Contact information: [address] [place]  [address] [phone]                Discharge Exam: Danley Danker Weights   11/20/22 0536 11/21/22 0734 11/23/22 0500  Weight: 93.5 kg 94.7 kg 92.2 kg   General exam: Appears calm and comfortable  Respiratory system: Clear to auscultation. Respiratory effort normal. Cardiovascular system: S1 & S2 heard, RRR. No JVD,  No pedal edema. Gastrointestinal system: Abdomen is soft, mildly tender in the incision site Central nervous system: Alert and oriented. No focal neurological deficits. Extremities: Symmetric 5 x 5 power. Skin: No rashes,  Psychiatry:  Mood & affect appropriate.    Condition at discharge: fair  The results of significant diagnostics from this hospitalization (including imaging, microbiology, ancillary and laboratory) are listed below for reference.   Imaging Studies: DG Abd Portable 2V  Result Date: 11/17/2022 CLINICAL DATA:  Abdominal pain EXAM: PORTABLE ABDOMEN - 2 VIEW COMPARISON:  CT abdomen and pelvis dated 11/16/2022 FINDINGS: Nonobstructive bowel gas pattern. No free air or pneumatosis. No abnormal radio-opaque calculi or mass effect. No acute or substantial osseous abnormality. The sacrum and coccyx are partially obscured by overlying bowel contents. Partially imaged lung bases are clear. IMPRESSION: Nonobstructive bowel gas pattern. Electronically Signed   By: Darrin Nipper M.D.   On: 11/17/2022 18:54   CT CHEST W CONTRAST  Result Date: 11/16/2022 CLINICAL DATA:  New diagnosis of stage IV colon cancer. Assess for thoracic disease. EXAM: CT CHEST WITH CONTRAST TECHNIQUE: Multidetector CT imaging of the chest was performed during intravenous contrast administration. RADIATION DOSE REDUCTION: This exam was performed according to the departmental dose-optimization program which includes automated exposure control, adjustment of the mA and/or kV according to patient size and/or use of iterative reconstruction technique. CONTRAST:  41mL OMNIPAQUE IOHEXOL 350 MG/ML SOLN  COMPARISON:  None Available. FINDINGS: Cardiovascular: No significant vascular findings. Normal heart size. No pericardial effusion. Mediastinum/Nodes: No enlarged mediastinal, hilar, or axillary lymph nodes. Thyroid gland, trachea, and esophagus demonstrate no significant findings. Lungs/Pleura: Mean 7 mm subpleural pulmonary nodule is seen within the basilar right middle lobe at axial image # 108/7. Similar 3 mm subpleural pulmonary nodule noted within the right lower lobe at axial image # 103/7. While not completely excluded, these are considered of very low likelihood to represent pulmonary metastatic disease. No additional focal pulmonary nodules or infiltrates. No pneumothorax or pleural effusion. Central airways are widely patent. Upper Abdomen: Multiple hypoattenuating masses are again identified throughout the liver, better seen on previously performed CT examination of the abdomen pelvis in keeping with bilobar hepatic metastatic disease. Musculoskeletal: No acute bone abnormality. No lytic or blastic bone lesion. IMPRESSION: 1. No definite evidence of metastatic disease within the chest. 2. Two subpleural pulmonary nodules within the right lung, considered of very low likelihood to represent pulmonary metastatic disease. Continued observation on subsequent surveillance CT would be warranted. 3. Bilobar hepatic metastatic disease, better seen on previously performed CT examination of the abdomen pelvis. Electronically Signed   By: Fidela Salisbury M.D.   On: 11/16/2022 23:25   CT ABDOMEN PELVIS W CONTRAST  Result Date: 11/16/2022 CLINICAL DATA:  Abdominal pain EXAM: CT ABDOMEN AND PELVIS WITH CONTRAST TECHNIQUE: Multidetector CT imaging of the abdomen and pelvis was performed using the standard protocol following bolus administration of intravenous contrast. RADIATION DOSE REDUCTION: This exam was performed according to the departmental dose-optimization program which includes automated exposure control,  adjustment of the mA and/or kV according to patient size and/or use of iterative reconstruction technique. CONTRAST:  36mL OMNIPAQUE IOHEXOL 350 MG/ML SOLN COMPARISON:  None Available. FINDINGS: Lower chest: No acute abnormality Hepatobiliary: Numerous large masses throughout the liver compatible with metastases. Index left hepatic mass on image 22 measures up to 7.6 cm. Index right hepatic mass on image 35 measures up to 8.6 cm. Gallbladder unremarkable. Pancreas: No focal abnormality or ductal dilatation. Spleen: No focal abnormality.  Normal size. Adrenals/Urinary Tract: No adrenal abnormality. No focal renal abnormality. No stones or hydronephrosis. Urinary bladder is unremarkable. Stomach/Bowel: No visible abnormality within the colon. However, there is ill-defined spiculated mass within the mesentery adjacent to the sigmoid colon measuring 2.9 x 2.1 cm. Given this finding and the masses within the liver, primary colon cancer is a concern. Stomach and small bowel decompressed, grossly unremarkable. Vascular/Lymphatic: Scattered aortic calcifications. No aneurysm. No retroperitoneal or  inguinal adenopathy. Reproductive: No visible focal abnormality. Other: No free fluid or free air. Musculoskeletal: No acute bony abnormality or focal bone lesion. IMPRESSION: Numerous large enhancing masses within the liver most compatible with metastases. Primary source not clearly visualized. However, there is abnormal soft tissue in the mesentery leading to the sigmoid colon which is concerning for adenopathy, possibly related to sigmoid colon malignancy although not visualized by CT. Consider further evaluation with colonoscopy. Electronically Signed   By: Rolm Baptise M.D.   On: 11/16/2022 02:21    Microbiology: Results for orders placed or performed during the hospital encounter of 11/15/22  Surgical pcr screen     Status: Abnormal   Collection Time: 11/18/22  9:38 AM   Specimen: Nasal Mucosa; Nasal Swab  Result  Value Ref Range Status   MRSA, PCR NEGATIVE NEGATIVE Final   Staphylococcus aureus POSITIVE (A) NEGATIVE Final    Comment: (NOTE) The Xpert SA Assay (FDA approved for NASAL specimens in patients 32 years of age and older), is one component of a comprehensive surveillance program. It is not intended to diagnose infection nor to guide or monitor treatment. Performed at [facility], [place] [address], [place], [place] [address]     Labs: CBC: Recent Labs  Lab 11/18/22 0324 11/19/22 0322 11/21/22 0138  WBC 12.1* 16.0* 8.2  HGB 12.0* 12.8* 11.0*  HCT 35.7* 37.2* 33.8*  MCV 88.1 87.5 90.4  PLT 275 323 [id number]   Basic Metabolic Panel: Recent Labs  Lab 11/18/22 0324 11/19/22 0322 11/20/22 0935 11/21/22 0138  NA 135 135 135 135  K 3.9 4.2 4.0 4.1  CL 99 99 98 103  CO2 27 26 28 27   GLUCOSE 112* 114* 92 85  BUN 12 8 13 11   CREATININE 1.35* 1.12 1.15 1.03  CALCIUM 8.9 8.9 9.0 8.9  MG 2.0  --   --   --    Liver Function Tests: Recent Labs  Lab 11/18/22 0324  AST 80*  ALT 52*  ALKPHOS 246*  BILITOT 1.3*  PROT 6.3*  ALBUMIN 2.9*   CBG: No results for input(s): "GLUCAP" in the last 168 hours.  Discharge time spent: 38 minutes.   Signed: Hosie Poisson, MD [facility]

## 2022-11-23 NOTE — Progress Notes (Signed)
Physical Therapy Treatment Patient Details Name: Richard Bowman MRN: [id number] DOB: Feb 04, 1976 Today's Date: 11/23/2022   History of Present Illness Richard Bowman is a 47 y.o. male admitted 11/15/22 with 3-4 months of generalized abdominal pain and intermittent bright red blood per rectum.  Found to have sigmoid colon obstructing mass with liver metastases.  Underwent sigmoid colectomy and liver biopsy 11/18/22.    PT Comments    Patient progressing well with mobility. Reports pain is under control and pt is having BMs. Session focused on stair training and mobility. Pt is mobilizing at Mod I-independent level without evidence of imbalance. Tolerated stairs without difficulty using rail for support. Discussed problem solving bed mobility at home with pt reporting he found a technique that works well for him to decrease discomfort. Able to donn abdominal brace independently. Eager to return home. Pt has met all goals and does not require further skilled therapy services. Reports feeling at 70% of baseline. All education completed. DIscharge from therapy.   Recommendations for follow up therapy are one component of a multi-disciplinary discharge planning process, led by the attending physician.  Recommendations may be updated based on patient status, additional functional criteria and insurance authorization.  Follow Up Recommendations   No PT follow up    Assistance Recommended at Discharge PRN  Patient can return home with the following Assistance with cooking/housework   Equipment Recommendations  None recommended by PT    Recommendations for Other Services       Precautions / Restrictions Precautions Precautions: None Precaution Comments: abdominal binder Restrictions Weight Bearing Restrictions: No     Mobility  Bed Mobility               General bed mobility comments: up in chair    Transfers Overall transfer level: Needs assistance Equipment used:  None Transfers: Sit to/from Stand Sit to Stand: Independent           General transfer comment: Stood from chair x2 without difficulty. Slow and guarded.    Ambulation/Gait Ambulation/Gait assistance: Modified independent (Device/Increase time) Gait Distance (Feet): 250 Feet Assistive device: None Gait Pattern/deviations: WFL(Within Functional Limits)   Gait velocity interpretation: 1.31 - 2.62 ft/sec, indicative of limited community ambulator   General Gait Details: Steady gait even with head turns and changes in direction. No evidence of imbalance. Good posture.   Stairs Stairs: Yes Stairs assistance: Modified independent (Device/Increase time) Stair Management: One rail Right, Alternating pattern Number of Stairs: 13 General stair comments: USe of rail, some pulling in abdomen when descending steps.   Wheelchair Mobility    Modified Rankin (Stroke Patients Only)       Balance Overall balance assessment: No apparent balance deficits (not formally assessed)                                          Cognition Arousal/Alertness: Awake/alert Behavior During Therapy: WFL for tasks assessed/performed Overall Cognitive Status: Within Functional Limits for tasks assessed                                          Exercises      General Comments General comments (skin integrity, edema, etc.): VSS on RA.      Pertinent Vitals/Pain Pain Assessment Pain Assessment: Faces Faces Pain Scale:  Hurts a little bit Pain Location: abdomen Pain Descriptors / Indicators: Sore Pain Intervention(s): Monitored during session    Home Living                          Prior Function            PT Goals (current goals can now be found in the care plan section) Progress towards PT goals: Goals met/education completed, patient discharged from PT    Frequency    Min 3X/week      PT Plan Current plan remains appropriate     Co-evaluation              AM-PAC PT "6 Clicks" Mobility   Outcome Measure  Help needed turning from your back to your side while in a flat bed without using bedrails?: None Help needed moving from lying on your back to sitting on the side of a flat bed without using bedrails?: None Help needed moving to and from a bed to a chair (including a wheelchair)?: None Help needed standing up from a chair using your arms (e.g., wheelchair or bedside chair)?: None Help needed to walk in hospital room?: None Help needed climbing 3-5 steps with a railing? : A Little 6 Click Score: 23    End of Session Equipment Utilized During Treatment: Other (comment) (abdominal binder) Activity Tolerance: Patient tolerated treatment well Patient left: in chair;with call bell/phone within reach;with nursing/sitter in room Nurse Communication: Mobility status PT Visit Diagnosis: Pain;Difficulty in walking, not elsewhere classified (R26.2) Pain - part of body:  (abdomen)     Time: [id number] PT Time Calculation (min) (ACUTE ONLY): 11 min  Charges:  $Gait Training: 8-22 mins                     Marisa Severin, PT, DPT Acute Rehabilitation Services Secure chat preferred Office [place] 11/23/2022, 9:57 AM

## 2022-11-23 NOTE — TOC Transition Note (Addendum)
Transition of Care [facility]) - CM/SW Discharge Note   Patient Details  Name: Richard Bowman MRN: [id number] Date of Birth: 01-31-1976  [facility] CM/SW Contact:  Curlene Labrum, RN Phone Number: 11/23/2022, 1:31 PM   Clinical Narrative:    CM met with the patient at the bedside and the patient was concerned about cost of discharge medications through [facility].  I sent a message to Wilkinson, PA to send medications through the [facility] for cost assistance.  11/23/22 1345 - CM met with the patient at the bedside to updated regarding discharge medications to be sent home from [facility].  I spoke with the patient and offered to schedule his PCP appointment since [facility] clinic schedule was full through May.  Alternative clinic will be schedule for PCP visit in the next 2 weeks - pending availability of opening.  11/23/22  1412 - CM met with the patient at the bedside and the patient states that [facility] was able to see him at the clinic tomorrow.  I called and Patient Care center back and they will cancel and patient plans to follow up at [facility].   Final next level of care: Home/Self Care Barriers to Discharge: Continued Medical Work up   Patient Goals and CMS Choice CMS Medicare.gov Compare Post Acute Care list provided to:: Patient Choice offered to / list presented to : Patient  Discharge Placement                         Discharge Plan and Services Additional resources added to the After Visit Summary for   In-house Referral: Development worker, community (Sent email to Shanon Rosser, Financial Counseling supervisor to follow up for Medicaid screening - patient with no insurance documented) Discharge Planning Services: CM Consult Post Acute Care Choice: Resumption of Svcs/PTA Provider, Durable Medical Equipment (Patient has a RW at home)                               Social Determinants of Health (SDOH) Interventions SDOH  Screenings   Food Insecurity: Patient Declined (11/16/2022)  Housing: Low Risk  (11/16/2022)  Transportation Needs: Patient Declined (11/16/2022)  Utilities: Patient Declined (11/16/2022)  Depression (PHQ2-9): Low Risk  (10/03/2020)  Tobacco Use: High Risk (11/19/2022)     Readmission Risk Interventions    11/18/2022    3:15 PM  Readmission Risk Prevention Plan  Post Dischage Appt Complete  Medication Screening Complete  Transportation Screening Complete

## 2022-11-24 ENCOUNTER — Encounter: Payer: Self-pay | Admitting: Family

## 2022-11-24 ENCOUNTER — Ambulatory Visit (INDEPENDENT_AMBULATORY_CARE_PROVIDER_SITE_OTHER): Payer: Medicaid Other | Admitting: Family

## 2022-11-24 VITALS — BP 120/83 | HR 80 | Temp 98.3°F | Resp 16 | Ht 69.0 in | Wt 200.0 lb

## 2022-11-24 DIAGNOSIS — Z87891 Personal history of nicotine dependence: Secondary | ICD-10-CM

## 2022-11-24 DIAGNOSIS — R1084 Generalized abdominal pain: Secondary | ICD-10-CM

## 2022-11-24 DIAGNOSIS — Z09 Encounter for follow-up examination after completed treatment for conditions other than malignant neoplasm: Secondary | ICD-10-CM

## 2022-11-24 DIAGNOSIS — K625 Hemorrhage of anus and rectum: Secondary | ICD-10-CM

## 2022-11-24 DIAGNOSIS — R911 Solitary pulmonary nodule: Secondary | ICD-10-CM | POA: Diagnosis not present

## 2022-11-24 DIAGNOSIS — K649 Unspecified hemorrhoids: Secondary | ICD-10-CM

## 2022-11-24 DIAGNOSIS — R7401 Elevation of levels of liver transaminase levels: Secondary | ICD-10-CM | POA: Diagnosis not present

## 2022-11-24 DIAGNOSIS — C787 Secondary malignant neoplasm of liver and intrahepatic bile duct: Secondary | ICD-10-CM

## 2022-11-24 DIAGNOSIS — R935 Abnormal findings on diagnostic imaging of other abdominal regions, including retroperitoneum: Secondary | ICD-10-CM | POA: Diagnosis not present

## 2022-11-24 DIAGNOSIS — K6389 Other specified diseases of intestine: Secondary | ICD-10-CM

## 2022-11-24 NOTE — Progress Notes (Signed)
.  Pt presents to establish care, and hospital f/u -discharged 11/23/22

## 2022-11-24 NOTE — Progress Notes (Signed)
Subjective:    Richard Bowman - 47 y.o. male MRN [id number]  Date of birth: 07/22/1976  HPI  Richard Bowman is to establish care and hospital discharge follow-up.   Current issues and/or concerns: 11/15/2022 - 11/23/2022 [facility] per MD note:  Recommendations at discharge:    Please follow up with Dr [name] as recommended.  Please follow up with PCP in one week.  Please follow up with general surgery as recommended.      Discharge Diagnoses: Principal Problem:   Rectal bleeding Active Problems:   Smoker   Transaminitis   Generalized abdominal pain   Metastasis to liver (HCC)   Abnormal CT of the abdomen   Mass of colon     Hospital Course:   47 year old male with history of esophageal ulcer but no other medical issues comes to the hospital with rectal bleeding. This started about 2 to 3 days ago. He reports about a 20 pound weight loss over the last several months, and also significant constipation. Otherwise he is healthy, active, and has no known medical problems. Imaging in the ED was concerning for liver mets and possible sigmoid mass. 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 Gen surgery consulted and he is underwent sigmoid colectomy with primary anastomosis and liver biopsy on 11/18/22.  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   CT of the chest and abdomen shows No definite evidence of metastatic disease within the chest.  Two subpleural pulmonary nodules within the right lung, considered of very low likelihood to represent pulmonary metastatic disease. Continued observation on subsequent surveillance CT wouldbe warranted. Numerous large enhancing masses within the liver most compatible with metastases.   Pain improved. Off pCA, passing  flatus and recommendations to ambulate out of bed.  Advance diet as tolerated.  Patient continues to report pain around 6 to 7/10. He is on Toradol 30 mg every 6 hours, tylenol, oxycodone 5 to 10 mg every 4 hours prn, dilaudid 0.5 to 1 mg every 6 hours prn    No BM today, advanced diet to regular today, monitor overnight and possible d/c in am.  He is on PPI and mylanta.    Tobacco abuse:  On nicotine patch.    Lung nodules.  Surveillance CT in one year    Mild AKI:  Resolved.    Leukocytosis ?reactive. Resolved.    Hemorrhoids On anusol cream.   Follow-Ups  Schedule an appointment with [name], Loraine Grip PA-C (Physician Assistant); You are scheduled for a post-hospitalization visit at the clinic tomorrow 11/24/22.  Please follow up with the clinc for your appointment. Go to [name], MD (General Surgery) on 12/15/2022; Your appointment is 12/15/22 @ 3:50pm Please arrive 15 minutes early to check in Go to [name], [place] (General Surgery) on 12/02/2022; Your appointment is 12/02/22 @ 9:30am with a nurse for staple removal., Arrive 56min early to check in, fill out paperwork, Bring photo ID and insurance information Schedule an appointment with [name], Rudell Cobb, MD (Oncology); For follow up   Today's visit 11/24/2022: - Stable since hospital discharge. Pain/discomfort especially with sudden movements such as coughing and hiccups. He is aware of his appointments with General Surgery and Oncology on next week. He is keeping his incision clean/dry and wearing abdominal binder. - Reports he is  no longer smoking since 11/15/2022.  - Reports no longer having rectal bleeding.  - No further issues/concerns for discussion today.    ROS per HPI    Health Maintenance:  Health Maintenance Due  Topic Date Due   COVID-19 Vaccine (1) Never done    Past Medical History: Patient Active Problem List   Diagnosis Date Noted   Generalized abdominal pain 11/17/2022   Metastasis to  liver ([place]) 11/17/2022   Abnormal CT of the abdomen 11/17/2022   Mass of colon 11/17/2022   Transaminitis 11/16/2022   Rectal bleeding 11/16/2022   Left arm pain 10/05/2020   Body aches 04/22/2016   Joint pain 04/02/2016   Muscle spasm 04/02/2016   Smoker 06/12/2014   Seizure-like activity ([place]) 06/12/2014      Social History   reports that he has been smoking cigarettes. He has a 26.00 pack-year smoking history. He has been exposed to tobacco smoke. He has never used smokeless tobacco. He reports current alcohol use. He reports that he does not use drugs.   Family History  family history includes COPD in his mother; Cancer in his mother.   Medications: reviewed and updated   Objective:   Physical Exam BP 120/83 (BP Location: Left Arm, Patient Position: Sitting, Cuff Size: Large)   Pulse 80   Temp 98.3 F (36.8 C)   Resp 16   Ht 5\' 9"  (1.753 m)   Wt 200 lb (90.7 kg)   SpO2 95%   BMI 29.53 kg/m   Physical Exam HENT:     Head: Normocephalic and atraumatic.  Eyes:     Extraocular Movements: Extraocular movements intact.     Conjunctiva/sclera: Conjunctivae normal.     Pupils: Pupils are equal, round, and reactive to light.  Cardiovascular:     Rate and Rhythm: Normal rate and regular rhythm.     Pulses: Normal pulses.     Heart sounds: Normal heart sounds.  Pulmonary:     Effort: Pulmonary effort is normal.     Breath sounds: Normal breath sounds.  Abdominal:     Comments: Surgical incision (with staples) clean/dry/intact. No evidence of drainage. No additional presentation. Abdominal binder was in place prior to examination. Elmon Else, CMA present.   Musculoskeletal:     Cervical back: Normal range of motion and neck supple.  Skin:    General: Skin is warm and dry.  Neurological:     General: No focal deficit present.     Mental Status: He is alert and oriented to person, place, and time.  Psychiatric:        Mood and Affect: Mood normal.         Behavior: Behavior normal.         Assessment & Plan:  1. Hospital discharge follow-up - Reviewed hospital course, current medications, ensured proper follow-up in place, and addressed concerns.    2. Metastasis to liver ([place]) 3. Generalized abdominal pain 4. Mass of colon 5. Abnormal CT of the abdomen 6. Transaminitis - Keep all scheduled appointments with Oncology.  - Keep all scheduled appointments with General Surgery.   7. Lung nodule 8. History of smoking - Referral to Pulmonology for further evaluation/management. - Ambulatory referral to Pulmonology  9. Rectal bleeding 10. Hemorrhoids, unspecified hemorrhoid type - Patient reports rectal bleeding resolved.  - Continue present management for hemorrhoids.     Patient was given clear instructions to go to Emergency Department or return to medical center if symptoms don't improve, worsen, or  new problems develop.The patient verbalized understanding.  I discussed the assessment and treatment plan with the patient. The patient was provided an opportunity to ask questions and all were answered. The patient agreed with the plan and demonstrated an understanding of the instructions.   The patient was advised to call back or seek an in-person evaluation if the symptoms worsen or if the condition fails to improve as anticipated.    Durene Fruits, NP 11/24/2022, 2:16 PM Primary Care at [facility]

## 2022-11-25 ENCOUNTER — Encounter: Payer: Self-pay | Admitting: *Deleted

## 2022-11-25 NOTE — Progress Notes (Signed)
Per Dr [name], request for [employer] testing sent on specimen [id number] DOS 11/18/2022.  Oncology Nurse Navigator Documentation     11/25/2022    8:00 AM  Oncology Nurse Navigator Flowsheets  Abnormal Finding Date 11/16/2022  Confirmed Diagnosis Date 11/18/2022  Diagnosis Status Pending Molecular Studies  Navigator Follow Up Date: 12/01/2022  Navigator Follow Up Reason: New Patient Appointment  Navigator Location [facility]  Navigator Encounter Type Pathology Review  Treatment Phase Pre-Tx/Tx Discussion  Barriers/Navigation Needs Coordination of Care  Interventions Coordination of Care  Acuity Level 2-Minimal Needs (1-2 Barriers Identified)  Coordination of Care Pathology  Time Spent with Patient 30

## 2022-11-27 LAB — SURGICAL PATHOLOGY

## 2022-12-01 ENCOUNTER — Inpatient Hospital Stay (HOSPITAL_BASED_OUTPATIENT_CLINIC_OR_DEPARTMENT_OTHER): Payer: Medicaid Other | Admitting: Hematology & Oncology

## 2022-12-01 ENCOUNTER — Encounter: Payer: Self-pay | Admitting: *Deleted

## 2022-12-01 ENCOUNTER — Other Ambulatory Visit: Payer: Self-pay | Admitting: *Deleted

## 2022-12-01 ENCOUNTER — Encounter: Payer: Self-pay | Admitting: Hematology & Oncology

## 2022-12-01 ENCOUNTER — Other Ambulatory Visit (HOSPITAL_BASED_OUTPATIENT_CLINIC_OR_DEPARTMENT_OTHER): Payer: Self-pay

## 2022-12-01 ENCOUNTER — Inpatient Hospital Stay: Payer: Medicaid Other | Attending: Hematology & Oncology

## 2022-12-01 VITALS — BP 121/81 | HR 104 | Temp 98.1°F | Resp 20 | Ht 69.0 in | Wt 192.0 lb

## 2022-12-01 DIAGNOSIS — C189 Malignant neoplasm of colon, unspecified: Secondary | ICD-10-CM

## 2022-12-01 DIAGNOSIS — Z5111 Encounter for antineoplastic chemotherapy: Secondary | ICD-10-CM | POA: Insufficient documentation

## 2022-12-01 DIAGNOSIS — C787 Secondary malignant neoplasm of liver and intrahepatic bile duct: Secondary | ICD-10-CM | POA: Insufficient documentation

## 2022-12-01 DIAGNOSIS — Z5189 Encounter for other specified aftercare: Secondary | ICD-10-CM | POA: Diagnosis not present

## 2022-12-01 DIAGNOSIS — C187 Malignant neoplasm of sigmoid colon: Secondary | ICD-10-CM | POA: Insufficient documentation

## 2022-12-01 DIAGNOSIS — R109 Unspecified abdominal pain: Secondary | ICD-10-CM | POA: Diagnosis not present

## 2022-12-01 DIAGNOSIS — K5900 Constipation, unspecified: Secondary | ICD-10-CM | POA: Insufficient documentation

## 2022-12-01 LAB — CBC WITH DIFFERENTIAL (CANCER CENTER ONLY)
Abs Immature Granulocytes: 0.07 10*3/uL (ref 0.00–0.07)
Basophils Absolute: 0.1 10*3/uL (ref 0.0–0.1)
Basophils Relative: 0 %
Eosinophils Absolute: 0.5 10*3/uL (ref 0.0–0.5)
Eosinophils Relative: 4 %
HCT: 38.9 % — ABNORMAL LOW (ref 39.0–52.0)
Hemoglobin: 13.1 g/dL (ref 13.0–17.0)
Immature Granulocytes: 1 %
Lymphocytes Relative: 14 %
Lymphs Abs: 2 10*3/uL (ref 0.7–4.0)
MCH: 29.8 pg (ref 26.0–34.0)
MCHC: 33.7 g/dL (ref 30.0–36.0)
MCV: 88.4 fL (ref 80.0–100.0)
Monocytes Absolute: 0.8 10*3/uL (ref 0.1–1.0)
Monocytes Relative: 6 %
Neutro Abs: 10.8 10*3/uL — ABNORMAL HIGH (ref 1.7–7.7)
Neutrophils Relative %: 75 %
Platelet Count: 468 10*3/uL — ABNORMAL HIGH (ref 150–400)
RBC: 4.4 MIL/uL (ref 4.22–5.81)
RDW: 12.6 % (ref 11.5–15.5)
WBC Count: 14.2 10*3/uL — ABNORMAL HIGH (ref 4.0–10.5)
nRBC: 0 % (ref 0.0–0.2)

## 2022-12-01 LAB — CEA (IN HOUSE-CHCC): CEA (CHCC-In House): 267.63 ng/mL — ABNORMAL HIGH (ref 0.00–5.00)

## 2022-12-01 LAB — IRON AND IRON BINDING CAPACITY (CC-WL,HP ONLY)
Iron: 52 ug/dL (ref 45–182)
Saturation Ratios: 18 % (ref 17.9–39.5)
TIBC: 284 ug/dL (ref 250–450)
UIBC: 232 ug/dL (ref 117–376)

## 2022-12-01 LAB — CMP (CANCER CENTER ONLY)
ALT: 111 U/L — ABNORMAL HIGH (ref 0–44)
AST: 121 U/L — ABNORMAL HIGH (ref 15–41)
Albumin: 3.9 g/dL (ref 3.5–5.0)
Alkaline Phosphatase: 484 U/L — ABNORMAL HIGH (ref 38–126)
Anion gap: 9 (ref 5–15)
BUN: 12 mg/dL (ref 6–20)
CO2: 27 mmol/L (ref 22–32)
Calcium: 10.1 mg/dL (ref 8.9–10.3)
Chloride: 102 mmol/L (ref 98–111)
Creatinine: 1.05 mg/dL (ref 0.61–1.24)
GFR, Estimated: >60 mL/min (ref >60)
Glucose, Bld: 150 mg/dL — ABNORMAL HIGH (ref 70–99)
Potassium: 4 mmol/L (ref 3.5–5.1)
Sodium: 138 mmol/L (ref 135–145)
Total Bilirubin: 0.6 mg/dL (ref 0.3–1.2)
Total Protein: 7.6 g/dL (ref 6.5–8.1)

## 2022-12-01 LAB — LACTATE DEHYDROGENASE: LDH: 967 U/L — ABNORMAL HIGH (ref 98–192)

## 2022-12-01 LAB — FERRITIN: Ferritin: 480 ng/mL — ABNORMAL HIGH (ref 24–336)

## 2022-12-01 MED ORDER — OXYCODONE HCL 10 MG PO TABS
5.0000 mg | ORAL_TABLET | Freq: Four times a day (QID) | ORAL | 0 refills | Status: DC | PRN
  Filled 2022-12-01: qty 60, 15d supply, fill #0

## 2022-12-01 MED ORDER — ONDANSETRON HCL 8 MG PO TABS
8.0000 mg | ORAL_TABLET | Freq: Three times a day (TID) | ORAL | 1 refills | Status: DC | PRN
Start: 2022-12-01 — End: 2023-01-05
  Filled 2022-12-01: qty 30, 10d supply, fill #0

## 2022-12-01 MED ORDER — LOPERAMIDE HCL 2 MG PO TABS
ORAL_TABLET | ORAL | 3 refills | Status: DC
Start: 2022-12-01 — End: 2023-08-27
  Filled 2022-12-01: qty 24, 3d supply, fill #0

## 2022-12-01 MED ORDER — PROCHLORPERAZINE MALEATE 10 MG PO TABS
10.0000 mg | ORAL_TABLET | Freq: Four times a day (QID) | ORAL | 1 refills | Status: DC | PRN
Start: 2022-12-01 — End: 2023-01-12
  Filled 2022-12-01: qty 30, 8d supply, fill #0

## 2022-12-01 MED ORDER — DEXAMETHASONE 4 MG PO TABS
8.0000 mg | ORAL_TABLET | Freq: Every day | ORAL | 1 refills | Status: DC
Start: 2022-12-01 — End: 2023-02-26
  Filled 2022-12-01: qty 30, 15d supply, fill #0

## 2022-12-01 NOTE — Progress Notes (Signed)
Patient here seeing Dr [name].  Was asked to do chemotherapy education while here in the office. Discussed side effects of Oxaliplatin, 5FU, Camptosar which include but are not limited to myelosuppression, decreased appetite, fatigue, fever, allergic or infusional reaction, mucositis, cardiac toxicity, cough, SOB, altered taste, nausea and vomiting, diarrhea, constipation, elevated LFTs myalgia and arthralgias, hair loss or thinning, rash, skin dryness, nail changes, peripheral neuropathy, discolored urine, delayed wound healing, mental changes (Chemo brain), increased risk of infections, weight loss.  Reviewed infusion room and office policy and procedure and phone numbers 24 hours x 7 days a week.  Reviewed ambulatory pump specifics and how to manage safe handling at home.  Reviewed when to call the office with any concerns or problems.  Scientist, clinical (histocompatibility and immunogenetics) given.  Discussed portacath insertion and EMLA cream administration.  Antiemetic protocol and chemotherapy schedule reviewed. Patient verbalized understanding of chemotherapy indications and possible side effects.  Teachback done

## 2022-12-01 NOTE — Progress Notes (Unsigned)
Hematology and Oncology Follow Up Visit  NAITHEN SCHUPPE [id number] Jul 17, 1976 47 y.o. 12/01/2022   Principle Diagnosis:  Metastatic adenocarcinoma of the colon-liver metastasis -- pMMR/MSI-low  Current Therapy:   FOLFOXIRI - start cycle #1 on 12/09/2022     Interim History:  Mr. Jakes is back for his first office visit.  I saw him in consultation at [facility].  He came in with abdominal pain.  He was found to have a near obstructing lesion in the sigmoid colon.  He had extensive liver metastasis.  He ultimately underwent a laparoscopic sigmoid colectomy on 11/18/2022.  The pathology report ([id number]) showed a moderately differentiated adenocarcinoma.  4/18 lymph nodes were positive.  All margins were negative.  He had a liver biopsy which was also positive for metastatic colon cancer.  He did have a preop CEA level of 140.  He is doing well.  He still has abdominal pain.  I will have to call in some pain medication for him.  He is eating a little bit better.  He does have a little bit of constipation.  I told him to try some MiraLAX.  He has had no obvious bleeding.  There is no leg swelling.  He has had no cough or shortness of breath.  He had a CT scan of the chest done while in the hospital.  This showed a couple tiny pulmonary nodules which were nonspecific.  1 nodule was 7 mm in the right middle lobe.  A second nodule was 3 mm right lower lobe.  He has had no fever.  There is been no headache.  Overall, I was his performance status is probably ECOG 0.  Medications:  Current Outpatient Medications:    acetaminophen (TYLENOL) 500 MG tablet, Take 2 tablets (1,000 mg total) by mouth every 6 (six) hours as needed for mild pain., Disp: 30 tablet, Rfl: 0   docusate sodium (COLACE) 100 MG capsule, Take 1 capsule (100 mg total) by mouth 2 (two) times daily., Disp: 10 capsule, Rfl: 0   hydrocortisone (ANUSOL-HC) 2.5 % rectal cream, Place rectally 3 (three) times daily as  needed for hemorrhoids or anal itching., Disp: 30 g, Rfl: 0   ibuprofen (ADVIL) 200 MG tablet, Take 3 tablets (600 mg total) by mouth every 6 (six) hours as needed for moderate pain., Disp: , Rfl:    methocarbamol (ROBAXIN) 500 MG tablet, Take 1 tablet (500 mg total) by mouth every 6 (six) hours as needed for muscle spasms., Disp: 30 tablet, Rfl: 0   ondansetron (ZOFRAN) 4 MG tablet, Take 1 tablet (4 mg total) by mouth every 6 (six) hours as needed for nausea., Disp: 20 tablet, Rfl: 0   Oxycodone HCl 10 MG TABS, Take 0.5-1 tablets (5-10 mg total) by mouth every 6 (six) hours as needed., Disp: 20 tablet, Rfl: 0   polyethylene glycol (MIRALAX / GLYCOLAX) 17 g packet, Take 17 g by mouth daily as needed for mild constipation., Disp: 14 each, Rfl: 0   feeding supplement (ENSURE ENLIVE / ENSURE PLUS) LIQD, Take 237 mLs by mouth 2 (two) times daily between meals. (Patient not taking: Reported on 12/01/2022), Disp: 237 mL, Rfl: 12   nicotine (NICODERM CQ - DOSED IN MG/24 HOURS) 21 mg/24hr patch, Place 1 patch (21 mg total) onto the skin daily. (Patient not taking: Reported on 12/01/2022), Disp: 30 patch, Rfl: 1  Allergies: No Known Allergies  Past Medical History, Surgical history, Social history, and Family History were reviewed and updated.  Review  of Systems: Review of Systems  Constitutional: Negative.   HENT:  Negative.    Eyes: Negative.   Respiratory: Negative.    Cardiovascular: Negative.   Gastrointestinal:  Positive for abdominal pain and constipation.  Endocrine: Negative.   Genitourinary: Negative.    Musculoskeletal: Negative.   Skin: Negative.   Neurological: Negative.   Hematological: Negative.   Psychiatric/Behavioral: Negative.      Physical Exam:  height is 5\' 9"  (1.753 m) and weight is 192 lb (87.1 kg). His oral temperature is 98.1 F (36.7 C). His blood pressure is 121/81 and his pulse is 104 (abnormal). His respiration is 20 and oxygen saturation is 100%.   Wt Readings from  Last 3 Encounters:  12/01/22 192 lb (87.1 kg)  11/24/22 200 lb (90.7 kg)  11/23/22 203 lb 4.2 oz (92.2 kg)    Physical Exam Vitals reviewed.  HENT:     Head: Normocephalic and atraumatic.  Eyes:     Pupils: Pupils are equal, round, and reactive to light.  Cardiovascular:     Rate and Rhythm: Normal rate and regular rhythm.     Heart sounds: Normal heart sounds.  Pulmonary:     Effort: Pulmonary effort is normal.     Breath sounds: Normal breath sounds.  Abdominal:     General: Bowel sounds are normal.     Palpations: Abdomen is soft.     Comments: Abdominal exam shows a well-healed laparotomy scar.  He has no fluid wave.  There is no guarding or rebound tenderness.  He does have hepatomegaly with his liver edge about 7-8 cm below the right costal margin.  There is no splenomegaly.  Musculoskeletal:        General: No tenderness or deformity. Normal range of motion.     Cervical back: Normal range of motion.  Lymphadenopathy:     Cervical: No cervical adenopathy.  Skin:    General: Skin is warm and dry.     Findings: No erythema or rash.  Neurological:     Mental Status: He is alert and oriented to person, place, and time.  Psychiatric:        Behavior: Behavior normal.        Thought Content: Thought content normal.        Judgment: Judgment normal.      Lab Results  Component Value Date   WBC 14.2 (H) 12/01/2022   HGB 13.1 12/01/2022   HCT 38.9 (L) 12/01/2022   MCV 88.4 12/01/2022   PLT 468 (H) 12/01/2022     Chemistry      Component Value Date/Time   NA 138 12/01/2022 1058   K 4.0 12/01/2022 1058   CL 102 12/01/2022 1058   CO2 27 12/01/2022 1058   BUN 12 12/01/2022 1058   CREATININE 1.05 12/01/2022 1058      Component Value Date/Time   CALCIUM 10.1 12/01/2022 1058   ALKPHOS 484 (H) 12/01/2022 1058   AST 121 (H) 12/01/2022 1058   ALT 111 (H) 12/01/2022 1058   BILITOT 0.6 12/01/2022 1058       Impression and Plan: Mr. Aldama is a very nice  47 year old white male.  He has really no past medical history.  He came to the hospital with abdominal pain.  He was found to have metastatic colon cancer.  I am awaiting the molecular studies to be done.  Regardless, we will set him up with systemic chemotherapy.  Given his young age, I think he would be a  great candidate for FOLFOXIRI.  At some point, we will probably add Avastin.  Will be interesting to see what his K-ras analysis is.  I talked him about chemotherapy.  He will need to have a Port-A-Cath placed.  Will see if Interventional Radiology can do this for [place].  I would like to think that he will respond.  I think the chance of responding should be about 80-85%.  Again, I am not too worried about what is in the lung.  These are incredibly small.  It is certainly conceivable that if we get a very good response in the liver, that we might be able to consider resecting out the masses in the liver.  I would give him 4 cycles of FOLFOXIRI and then I will do another scan on him.  The CEA will also give [place] a good idea as to how well he is responding.  He is in great shape.  He will continue to eat well.  I told to have small frequent meals.  For right now, we will go ahead and plan to see him back when he starts his second cycle of treatment.   [name], MD 4/2/202412:49 PM

## 2022-12-01 NOTE — Progress Notes (Signed)
START ON PATHWAY REGIMEN - Colorectal     A cycle is every 14 days:     Bevacizumab-xxxx      Irinotecan      Oxaliplatin      Leucovorin      Fluorouracil   **Always confirm dose/schedule in your pharmacy ordering system**  Patient Characteristics: Distant Metastases, Nonsurgical Candidate, KRAS/NRAS Mutation Positive/Unknown (BRAF V600 Wild-Type/Unknown), Standard Cytotoxic Therapy, First Line Standard Cytotoxic Therapy, Bevacizumab Eligible, PS = 0,1 Tumor Location: Colon Therapeutic Status: Distant Metastases Microsatellite/Mismatch Repair Status: MSS/pMMR BRAF Mutation Status: Awaiting Test Results KRAS/NRAS Mutation Status: Awaiting Test Results Preferred Therapy Approach: Standard Cytotoxic Therapy Standard Cytotoxic Line of Therapy: First Line Standard Cytotoxic Therapy ECOG Performance Status: 0 Bevacizumab Eligibility: Eligible Intent of Therapy: Non-Curative / Palliative Intent, Discussed with Patient

## 2022-12-01 NOTE — Progress Notes (Unsigned)
Initial RN Navigator Patient Visit  Name: Richard Bowman Diagnosis: Metastatic Colon Cancer  Met with patient prior to their visit with MD. Hanley Seamen patient "Your Patient Navigator" handout which explains my role, areas in which I am able to help, and all the contact information for myself and the office. Also gave patient MD and Navigator business card. Reviewed with patient the general overview of expected course after initial diagnosis and time frame for all steps to be completed.  Patient was diagnosed and staged while an inpatient. He comes into the office today for treatment discussion. Our chemo educator will be able to speak to him today.  He already has financial concerns. He initiated an application to medicaid while hospitalized. He hasn't worked since admission. He states he was a [employer] employee so he had no benefits. Referral to social work placed.   Patient qualifies for genetics. Reviewed benefits of testing and provided patient with genetic brochure.   Patient completed visit with Dr. [name].   Plan to start chemo on 12/09/2022. Port will need to be placed. Scheduled for 12/08/2022.  Called and spoke to patient. He is aware of date, location, arrival time of 9am. NPO after midnight, need for driver and 24 hour supervision. He may take meds with sips of water.   Patient understands all follow up procedures and expectations. They have my number to reach out for any further clarification or additional needs.   Oncology Nurse Navigator Documentation     12/01/2022   11:00 AM  Oncology Nurse Navigator Flowsheets  Phase of Treatment Chemo  Navigator Follow Up Date: 12/09/2022  Navigator Follow Up Reason: Chemotherapy  Navigator Location [facility]  Navigator Encounter Type Initial MedOnc  Patient Visit Type MedOnc  Treatment Phase Pre-Tx/Tx Discussion  Barriers/Navigation Needs Coordination of Care;Education;Financial Toxicity;Unemployed  Education Pain/ Symptom  Management;Preparing for Upcoming Surgery/ Treatment;Other  Interventions Coordination of Care;Education;Psycho-Social Support;Referrals  Acuity Level 3-Moderate Needs (3-4 Barriers Identified)  Referrals Nutrition/dietician;Social Work  Coordination of Care Radiology  Education Method Verbal;Written  Time Spent with Patient 60

## 2022-12-02 ENCOUNTER — Ambulatory Visit: Payer: Self-pay | Admitting: Dietician

## 2022-12-02 ENCOUNTER — Encounter: Payer: Self-pay | Admitting: Hematology & Oncology

## 2022-12-02 ENCOUNTER — Encounter: Payer: Self-pay | Admitting: *Deleted

## 2022-12-02 ENCOUNTER — Inpatient Hospital Stay: Payer: Medicaid Other | Admitting: Licensed Clinical Social Worker

## 2022-12-02 NOTE — Progress Notes (Signed)
Nutrition Assessment: Reached out to patient at home telephone number.    Reason for Assessment: New Patient Assessment   ASSESSMENT: Patient is a 47 year old white male.  He was recently hospitalized with abdominal pain and rectal bleeding and found to have  partially obstructing sigmoid colon mass that was resected. He's planned to undergo treatment with Oxaliplatin, 5FU, Camptosar of malignant for moderately differentiated adenocarcinoma. He is followed by Dr. [name]. Prior to hospitalization he was relatively healthy.   Spoke to patient at mobile telephone #.  Patient reports  Appetite is ok, still is afraid of eating.  When he eats he stops early before pain starts.  Little nausea this morning, he thinks it may be related to pain meds.  Denies GERD since surgery.  Bloated pain has subsided since surgery and eating smaller volumes of food.  Stools more formed, bowel movement yesterday. Since being discharged trying smaller meals. Yesterday bought some chicken nuggets, tolerated them well.  Usually eats on the run. Marland Kitchen Usual foods burgers, chicken tries to get a salad a couple times a week Breakfast: pancakes or breakfast burrito Snacks:  cookie, crackers, not big on chips but will eat them, nuts (cashews are favorite, peanut) fruit (precut cantaloupe or watermelon) Fluids: Monster in morning (doesn't ) water 3-4 bottles, Dr. Malachi Bonds 2, 1 juice daily (apple or orange)   Nutrition Focused Physical Exam: unable to perform NFPE   Medications: no vits or supplements, has sample Ensure complete not using yet, anti emetics ordered, also has Miralax for bowel regularity   Labs: Reviewed 4/2   Anthropometrics:  Has lost 31# (13.9%) past 6 weeks which is significant  Height: 69" Weight:  12/01/22  192# 11/15/22  202# 10/28/22  223# UBW: 220# BMI: 28.35   Estimated Energy Needs  Kcals: 2600-3000 Protein: 104-130 g Fluid: 3 L   NUTRITION DIAGNOSIS: Inadequate PO intake r/t to pain and  recent cancer diagnosis   INTERVENTION:   Relayed that nutrition services are wrap around service provided at no charge and encouraged continued communication if experiencing any nutritional impact symptoms (NIS). Educated on importance of adequate nourishment with calorie and protein energy intake  with nutrient dense foods when possible to maintain weight/strength and QOL.   Encouraged high protein foods as well as small frequent meals/snacks. Reviewed rationale for using oral nutrition supplements. Encourage increased fruit and vegetables for improved nutrient intake. Emailed Nutrition Tip sheet  for  Nutrition During Cancer Treatment, High Protein Snacking, with contact information provided.  MONITORING, EVALUATION, GOAL: weight trends, nutrition impact symptoms, PO intake, labs  Goal is weight maintenance  Next Visit: PRN at patient or provider request.  April Manson, RDN, LDN Registered Dietitian, [place] (Usual office hours: Tuesday-Thursday) Mobile: [phone]

## 2022-12-02 NOTE — Progress Notes (Signed)
Received a call from the patient asking clarifying questions about his port placement, chemo schedule and treatment.   He is also still experiencing abdominal pain, and toe/foot cramping. Patient is taking his prescribed medications as directed. We discussed non-pharmacological methods to help treat his pain like massage and heating pads. He stated he would try these. Patient expressed being stressed with everything he was dealing with stating "before I went into the hospital all I ever had to take was an ibuprofen". Actively listened to his concerns.   He knows he can call the office with any further needs.   Oncology Nurse Navigator Documentation     12/02/2022   10:30 AM  Oncology Nurse Navigator Flowsheets  Navigator Follow Up Date: 12/09/2022  Navigator Follow Up Reason: Chemotherapy  Navigator Location [facility]  Navigator Encounter Type Telephone  Telephone Symptom Mgt;Incoming Call  Patient Visit Type MedOnc  Treatment Phase Pre-Tx/Tx Discussion  Barriers/Navigation Needs Coordination of Care;Education;Financial Toxicity;Unemployed  Education Pain/ Symptom Management;Preparing for Upcoming Surgery/ Treatment;Other  Interventions Education;Psycho-Social Support  Acuity Level 3-Moderate Needs (3-4 Barriers Identified)  Education Method Verbal  Time Spent with Patient 30

## 2022-12-02 NOTE — Progress Notes (Signed)
[place] Work  Initial Assessment   Richard Bowman is a 47 y.o. year old male contacted by phone. Clinical Social Work was referred by nurse navigator for assessment of psychosocial needs.   SDOH (Social Determinants of Health) assessments performed: Yes SDOH Interventions    Flowsheet Row Clinical Support from 12/02/2022 in [place] at [place] Interventions   Financial Strain Interventions Financial Counselor       [place]: Patient Declined (11/16/2022)  Housing: Low Risk  (11/16/2022)  Transportation Needs: Patient Declined (11/16/2022)  Utilities: Patient Declined (11/16/2022)  Depression (PHQ2-9): Low Risk  (11/24/2022)  Financial Resource Strain: Medium Risk (12/02/2022)  Tobacco Use: Medium Risk (12/01/2022)     Distress Screen completed: No     No data to display            Family/Social Information:  Housing Arrangement: Patient lives with his sister at times. Family members/support persons in your life? Family and Friends Transportation concerns: no  Employment: Unemployed since being hospitalized. Income source: Supported by [facility] and Friends Financial concerns: Yes, due to illness and/or loss of work during treatment Type of concern: Rent/ mortgage and Medical bills Food access concerns: no Religious or spiritual practice: No Services Currently in place:  Medicaid and disability pending.  Coping/ Adjustment to diagnosis: Patient understands treatment plan and what happens next? 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 Patient enjoys time with family/ friends Current coping skills/ strengths: Capable of independent living , Armed forces logistics/support/administrative officer , General fund of knowledge , Motivation for treatment/growth , and Supportive family/friends     SUMMARY: Current  SDOH Barriers:  Financial constraints related to not working.  Clinical Social Work Clinical Goal(s):  Explore community resource options for unmet needs related to:  Financial Strain   Interventions: Discussed common feeling and emotions when being diagnosed with cancer, and the importance of support during treatment Informed patient of the support team roles and support services at [facility] Provided [place] contact information and encouraged patient to call with any questions or concerns Provided patient with information about the [employer] which he would like to be referred to.  Patient agreed to have CSW mail him [facility] program information.   Follow Up Plan: Patient will contact CSW with any support or resource needs Patient verbalizes understanding of plan: Yes    Rodman Pickle Marlei Glomski, LCSW

## 2022-12-03 ENCOUNTER — Institutional Professional Consult (permissible substitution): Payer: Self-pay | Admitting: Emergency Medicine

## 2022-12-03 ENCOUNTER — Other Ambulatory Visit: Payer: Self-pay | Admitting: Radiology

## 2022-12-03 ENCOUNTER — Other Ambulatory Visit: Payer: Self-pay

## 2022-12-03 NOTE — Progress Notes (Unsigned)
Pharmacist Chemotherapy Monitoring - Initial Assessment    Anticipated start date: 12/09/22   The following has been reviewed per standard work regarding the patient's treatment regimen: The patient's diagnosis, treatment plan and drug doses, and organ/hematologic function Lab orders and baseline tests specific to treatment regimen  The treatment plan start date, drug sequencing, and pre-medications Prior authorization status  Patient's documented medication list, including drug-drug interaction screen and prescriptions for anti-emetics and supportive care specific to the treatment regimen The drug concentrations, fluid compatibility, administration routes, and timing of the medications to be used The patient's access for treatment and lifetime cumulative dose history, if applicable  The patient's medication allergies and previous infusion related reactions, if applicable   Changes made to treatment plan:  N/A  Follow up needed:  Self pay, Estelle Grumbles notified.  Follow up labs day of treatment for possible dose adjustments.   Richard Bowman, Richard Bowman, Richard Bowman, 12/03/2022  1:32 PM

## 2022-12-04 ENCOUNTER — Other Ambulatory Visit: Payer: Self-pay | Admitting: *Deleted

## 2022-12-04 MED ORDER — LACTULOSE 10 GM/15ML PO SOLN: 10.0000 g | Freq: Three times a day (TID) | ORAL | 0 refills | Status: DC

## 2022-12-07 ENCOUNTER — Other Ambulatory Visit: Payer: Self-pay | Admitting: Internal Medicine

## 2022-12-08 ENCOUNTER — Ambulatory Visit: Payer: Self-pay | Admitting: Nurse Practitioner

## 2022-12-08 ENCOUNTER — Encounter: Payer: Self-pay | Admitting: *Deleted

## 2022-12-08 ENCOUNTER — Ambulatory Visit (HOSPITAL_COMMUNITY)
Admission: RE | Admit: 2022-12-08 | Discharge: 2022-12-08 | Disposition: A | Discharge status: Home / Self Care | Payer: Medicaid Other | Source: Ambulatory Visit | Attending: Hematology & Oncology | Admitting: Hematology & Oncology

## 2022-12-08 DIAGNOSIS — C787 Secondary malignant neoplasm of liver and intrahepatic bile duct: Secondary | ICD-10-CM | POA: Insufficient documentation

## 2022-12-08 DIAGNOSIS — Z538 Procedure and treatment not carried out for other reasons: Secondary | ICD-10-CM | POA: Insufficient documentation

## 2022-12-08 DIAGNOSIS — C189 Malignant neoplasm of colon, unspecified: Secondary | ICD-10-CM | POA: Diagnosis present

## 2022-12-08 MED ORDER — SODIUM CHLORIDE 0.9 % IV SOLN: INTRAVENOUS | Status: DC

## 2022-12-08 NOTE — Progress Notes (Signed)
   Pt arrives today for [facility] placement in IR He has no ride home and is unable to arrange one at this time.  Must reschedule as we sedate for this procedure  He will hear from scheduler for next available appt  He is aware and agreeable  Dr Myna Hidalgo and Mariane Baumgarten [facility] made aware

## 2022-12-08 NOTE — Progress Notes (Signed)
Received a call from patient stating he is at the hospital for port placement, but didn't remember needing a driver or supervision. He is going to reschedule. His chemo was scheduled for tomorrow, but will need to be rescheduled once port placement scheduled. Dr Myna Hidalgo notified of delay.   Port scheduled for 12/14/22. Chemo rescheduled for 12/16/2022.  Oncology Nurse Navigator Documentation     12/08/2022    9:30 AM  Oncology Nurse Navigator Flowsheets  Navigator Follow Up Date: 12/16/2022  Navigator Follow Up Reason: Chemotherapy  Navigator Location [facility]  Navigator Encounter Type Telephone  Telephone Incoming Call  Patient Visit Type MedOnc  Treatment Phase Pre-Tx/Tx Discussion  Barriers/Navigation Needs Coordination of Care;Education;Financial Toxicity;Unemployed  Education Other  Interventions Coordination of Care;Education  Acuity Level 3-Moderate Needs (3-4 Barriers Identified)  Coordination of Care Radiology;Appts  Education Method Verbal  Time Spent with Patient 30

## 2022-12-09 ENCOUNTER — Inpatient Hospital Stay: Payer: Medicaid Other

## 2022-12-09 ENCOUNTER — Ambulatory Visit: Payer: Self-pay | Admitting: Hematology & Oncology

## 2022-12-09 ENCOUNTER — Other Ambulatory Visit: Payer: Self-pay | Admitting: Radiology

## 2022-12-11 ENCOUNTER — Encounter: Payer: Self-pay | Admitting: *Deleted

## 2022-12-11 NOTE — Progress Notes (Signed)
Patient calling with continued issues with constipation. He has tried Miralax and Lactulose TID.  Instructed patient to take lactulose q2h until bowel movement. Also reviewed diet and fluid intake. He isn't have issues with nausea and his appetite is good. He's increasing fiber intake and fluid intake.   Also reviewed all his appointments this week. He is aware of prep, and appointment dates, time and location.  Oncology Nurse Navigator Documentation     12/11/2022   10:30 AM  Oncology Nurse Navigator Flowsheets  Navigator Follow Up Date: 12/16/2022  Navigator Follow Up Reason: Chemotherapy  Navigator Location [facility]  Navigator Encounter Type Telephone  Telephone Incoming Call;Symptom Mgt  Patient Visit Type MedOnc  Treatment Phase Pre-Tx/Tx Discussion  Barriers/Navigation Needs Coordination of Care;Education;Financial Toxicity;Unemployed  Education Pain/ Symptom Management  Interventions Education;Psycho-Social Support  Acuity Level 3-Moderate Needs (3-4 Barriers Identified)  Education Method Verbal  Time Spent with Patient 30

## 2022-12-12 ENCOUNTER — Encounter (HOSPITAL_COMMUNITY): Payer: Self-pay | Admitting: Emergency Medicine

## 2022-12-12 ENCOUNTER — Emergency Department (HOSPITAL_COMMUNITY)
Admission: EM | Admit: 2022-12-12 | Discharge: 2022-12-13 | Disposition: A | Discharge status: Home / Self Care | Payer: Medicaid Other | Attending: Emergency Medicine | Admitting: Emergency Medicine

## 2022-12-12 DIAGNOSIS — E878 Other disorders of electrolyte and fluid balance, not elsewhere classified: Secondary | ICD-10-CM | POA: Diagnosis not present

## 2022-12-12 DIAGNOSIS — Z85038 Personal history of other malignant neoplasm of large intestine: Secondary | ICD-10-CM | POA: Diagnosis not present

## 2022-12-12 DIAGNOSIS — E871 Hypo-osmolality and hyponatremia: Secondary | ICD-10-CM | POA: Insufficient documentation

## 2022-12-12 DIAGNOSIS — R7401 Elevation of levels of liver transaminase levels: Secondary | ICD-10-CM | POA: Diagnosis not present

## 2022-12-12 DIAGNOSIS — D72829 Elevated white blood cell count, unspecified: Secondary | ICD-10-CM | POA: Diagnosis not present

## 2022-12-12 DIAGNOSIS — K5903 Drug induced constipation: Secondary | ICD-10-CM | POA: Insufficient documentation

## 2022-12-12 DIAGNOSIS — R109 Unspecified abdominal pain: Secondary | ICD-10-CM | POA: Diagnosis present

## 2022-12-12 DIAGNOSIS — T50905A Adverse effect of unspecified drugs, medicaments and biological substances, initial encounter: Secondary | ICD-10-CM | POA: Insufficient documentation

## 2022-12-12 NOTE — ED Triage Notes (Addendum)
Pt is having trouble moving bowels. LBM- 12/01/22. Recent colon surgery on 20th for cancer. Pt has tried lactulose x 8 times yesterday, miralax blow out but not successful. Having belly pain but not as bad as before surgery. Has been in contact with MDs and following their recommendations. Is passing gas

## 2022-12-13 ENCOUNTER — Encounter (HOSPITAL_COMMUNITY): Payer: Self-pay

## 2022-12-13 ENCOUNTER — Emergency Department (HOSPITAL_COMMUNITY): Payer: Medicaid Other

## 2022-12-13 LAB — COMPREHENSIVE METABOLIC PANEL
ALT: 60 U/L — ABNORMAL HIGH (ref 0–44)
AST: 110 U/L — ABNORMAL HIGH (ref 15–41)
Albumin: 3.2 g/dL — ABNORMAL LOW (ref 3.5–5.0)
Alkaline Phosphatase: 420 U/L — ABNORMAL HIGH (ref 38–126)
Anion gap: 14 (ref 5–15)
BUN: 13 mg/dL (ref 6–20)
CO2: 24 mmol/L (ref 22–32)
Calcium: 9.7 mg/dL (ref 8.9–10.3)
Chloride: 92 mmol/L — ABNORMAL LOW (ref 98–111)
Creatinine, Ser: 1.19 mg/dL (ref 0.61–1.24)
GFR, Estimated: >60 mL/min (ref >60)
Glucose, Bld: 144 mg/dL — ABNORMAL HIGH (ref 70–99)
Potassium: 3.6 mmol/L (ref 3.5–5.1)
Sodium: 130 mmol/L — ABNORMAL LOW (ref 135–145)
Total Bilirubin: 0.9 mg/dL (ref 0.3–1.2)
Total Protein: 7.4 g/dL (ref 6.5–8.1)

## 2022-12-13 LAB — CBC WITH DIFFERENTIAL/PLATELET
Abs Immature Granulocytes: 0.07 10*3/uL (ref 0.00–0.07)
Basophils Absolute: 0 10*3/uL (ref 0.0–0.1)
Basophils Relative: 0 %
Eosinophils Absolute: 0.3 10*3/uL (ref 0.0–0.5)
Eosinophils Relative: 2 %
HCT: 36.6 % — ABNORMAL LOW (ref 39.0–52.0)
Hemoglobin: 12.8 g/dL — ABNORMAL LOW (ref 13.0–17.0)
Immature Granulocytes: 1 %
Lymphocytes Relative: 20 %
Lymphs Abs: 2.9 10*3/uL (ref 0.7–4.0)
MCH: 29.8 pg (ref 26.0–34.0)
MCHC: 35 g/dL (ref 30.0–36.0)
MCV: 85.1 fL (ref 80.0–100.0)
Monocytes Absolute: 1.1 10*3/uL — ABNORMAL HIGH (ref 0.1–1.0)
Monocytes Relative: 8 %
Neutro Abs: 10.4 10*3/uL — ABNORMAL HIGH (ref 1.7–7.7)
Neutrophils Relative %: 69 %
Platelets: 368 10*3/uL (ref 150–400)
RBC: 4.3 MIL/uL (ref 4.22–5.81)
RDW: 12.2 % (ref 11.5–15.5)
WBC: 14.8 10*3/uL — ABNORMAL HIGH (ref 4.0–10.5)
nRBC: 0 % (ref 0.0–0.2)

## 2022-12-13 LAB — LIPASE, BLOOD: Lipase: 26 U/L (ref 11–51)

## 2022-12-13 MED ORDER — POLYETHYLENE GLYCOL 3350 17 G PO PACK
17.0000 g | PACK | Freq: Every day | ORAL | 2 refills | Status: DC
  Filled 2022-12-13: qty 30, 30d supply, fill #0

## 2022-12-13 MED ORDER — ACETAMINOPHEN 325 MG PO TABS: 650.0000 mg | ORAL_TABLET | ORAL | Status: DC

## 2022-12-13 MED ORDER — LACTATED RINGERS IV BOLUS: 1000.0000 mL | Freq: Once | INTRAVENOUS | Status: DC

## 2022-12-13 MED ORDER — IOHEXOL 350 MG/ML SOLN
75.0000 mL | Freq: Once | INTRAVENOUS | Status: AC | PRN
  Administered 2022-12-13: 75 mL via INTRAVENOUS

## 2022-12-13 MED ORDER — SODIUM CHLORIDE 0.9 % IV BOLUS
1000.0000 mL | Freq: Once | INTRAVENOUS | Status: AC
  Administered 2022-12-13: 1000 mL via INTRAVENOUS

## 2022-12-13 MED ORDER — ACETAMINOPHEN 500 MG PO TABS: 1000.0000 mg | ORAL_TABLET | ORAL | Status: DC

## 2022-12-13 NOTE — Discharge Instructions (Addendum)
Your CT scan did not show any acute abnormal findings.  Some progression of the tumors that you were diagnosed with.  I have printed a copy of this for you.  Please be in touch with your oncologist and surgeon. You may always return the emergency room for any new or concerning symptoms.  Cleanout:  Miralax cleanout 5 capfuls in 24-36 oz gatorade, drink over 4-6 hrs. If stool is not clear after 24 hours, then repeat this dose for a second day.   After Cleanout:  Give Miralax 1 capful in 8 oz juice or gatorade daily for at least 4-6 weeks.  Schedule follow up with pediatrician if no improvement in constipation in 1-2 months, sooner if not resolved after cleanout.      GETTING TO GOOD BOWEL HEALTH. Irregular bowel habits such as constipation and diarrhea can lead to many problems over time.  Having one soft bowel movement a day is the most important way to prevent further problems.  The anorectal canal is designed to handle stretching and feces to safely manage our ability to get rid of solid waste (feces, poop, stool) out of our body.  BUT, hard constipated stools can act like ripping concrete bricks and diarrhea can be a burning fire to this very sensitive area of our body, causing inflamed hemorrhoids, anal fissures, increasing risk is perirectal abscesses, abdominal pain/bloating, an making irritable bowel worse.     The goal: ONE SOFT BOWEL MOVEMENT A DAY!  To have soft, regular bowel movements:  Drink at least 8 tall glasses of water a day.   Take plenty of fiber.  Fiber is the undigested part of plant food that passes into the colon, acting s "natures broom" to encourage bowel motility and movement.  Fiber can absorb and hold large amounts of water. This results in a larger, bulkier stool, which is soft and easier to pass. Work gradually over several weeks up to 6 servings a day of fiber (25g a day even more if needed) in the form of: Vegetables -- Root (potatoes, carrots, turnips), leafy  green (lettuce, salad greens, celery, spinach), or cooked high residue (cabbage, broccoli, etc) Fruit -- Fresh (unpeeled skin & pulp), Dried (prunes, apricots, cherries, etc ),  or stewed ( applesauce)  Whole grain breads, pasta, etc (whole wheat)  Bran cereals  Bulking Agents -- This type of water-retaining fiber generally is easily obtained each day by one of the following:  Psyllium bran -- The psyllium plant is remarkable because its ground seeds can retain so much water. This product is available as Metamucil, Konsyl, Effersyllium, Per Diem Fiber, or the less expensive generic preparation in drug and health food stores. Although labeled a laxative, it really is not a laxative.  Methylcellulose -- This is another fiber derived from wood which also retains water. It is available as Citrucel. Polyethylene Glycol - and "artificial" fiber commonly called Miralax or Glycolax.  It is helpful for people with gassy or bloated feelings with regular fiber Flax Seed - a less gassy fiber than psyllium No reading or other relaxing activity while on the toilet. If bowel movements take longer than 5 minutes, you are too constipated AVOID CONSTIPATION.  High fiber and water intake usually takes care of this.  Sometimes a laxative is needed to stimulate more frequent bowel movements, but  Laxatives are not a good long-term solution as it can wear the colon out. Osmotics (Milk of Magnesia, Fleets phosphosoda, Magnesium citrate, MiraLax, GoLytely) are safer than  Stimulants (  Senokot, Castor Oil, Dulcolax, Ex Lax)    Do not take laxatives for more than 7days in a row.  IF SEVERELY CONSTIPATED, try a Bowel Retraining Program: Do not use laxatives.  Eat a diet high in roughage, such as bran cereals and leafy vegetables.  Drink six (6) ounces of prune or apricot juice each morning.  Eat two (2) large servings of stewed fruit each day.  Take one (1) heaping tablespoon of a psyllium-based bulking agent twice a day.  Use sugar-free sweetener when possible to avoid excessive calories.  Eat a normal breakfast.  Set aside 15 minutes after breakfast to sit on the toilet, but do not strain to have a bowel movement.  If you do not have a bowel movement by the third day, use an enema and repeat the above steps.  Controlling diarrhea Switch to liquids and simpler foods for a few days to avoid stressing your intestines further. Avoid dairy products (especially milk & ice cream) for a short time.  The intestines often can lose the ability to digest lactose when stressed. Avoid foods that cause gassiness or bloating.  Typical foods include beans and other legumes, cabbage, broccoli, and dairy foods.  Every person has some sensitivity to other foods, so listen to our body and avoid those foods that trigger problems for you. Adding fiber (Citrucel, Metamucil, psyllium, Miralax) gradually can help thicken stools by absorbing excess fluid and retrain the intestines to act more normally.  Slowly increase the dose over a few weeks.  Too much fiber too soon can backfire and cause cramping & bloating. Probiotics (such as active yogurt, Align, etc) may help repopulate the intestines and colon with normal bacteria and calm down a sensitive digestive tract.  Most studies show it to be of mild help, though, and such products can be costly. Medicines: Bismuth subsalicylate (ex. Kayopectate, Pepto Bismol) every 30 minutes for up to 6 doses can help control diarrhea.  Avoid if pregnant. Loperamide (Immodium) can slow down diarrhea.  Start with two tablets (4mg  total) first and then try one tablet every 6 hours.  Avoid if you are having fevers or severe pain.  If you are not better or start feeling worse, stop all medicines and call your doctor for advice Call your doctor if you are getting worse or not better.  Sometimes further testing (cultures, endoscopy, X-ray studies, bloodwork, etc) may be needed to help diagnose and treat the cause of  the diarrhea.  Managing Pain  Pain after surgery or related to activity is often due to strain/injury to muscle, tendon, nerves and/or incisions.  This pain is usually short-term and will improve in a few months.   Many people find it helpful to do the following things TOGETHER to help speed the process of healing and to get back to regular activity more quickly:  Avoid heavy physical activity  no lifting greater than 20 pounds Do not "push through" the pain.  Listen to your body and avoid positions and maneuvers than reproduce the pain Walking is okay as tolerated, but go slowly and stop when getting sore.  Remember: If it hurts to do it, then don't do it! Take Anti-inflammatory medication  Take with food/snack around the clock for 1-2 weeks This helps the muscle and nerve tissues become less irritable and calm down faster Choose ONE of the following over-the-counter medications: Naproxen 220mg  tabs (ex. Aleve) 1-2 pills twice a day  Ibuprofen 200mg  tabs (ex. Advil, Motrin) 3-4 pills with every meal  and just before bedtime Acetaminophen  tabs (Tylenol) 1-2 pills with every meal and just before bedtime Use a Heating pad or Ice/Cold Pack 4-6 times a day May use warm bath/hottub  or showers Try Gentle Massage and/or Stretching  at the area of pain many times a day stop if you feel pain - do not overdo it  Try these steps together to help you body heal faster and avoid making things get worse.  Doing just one of these things may not be enough.    If you are not getting better after two weeks or are noticing you are getting worse, contact our office for further advice; we may need to re-evaluate you & see what other things we can do to help.

## 2022-12-13 NOTE — ED Notes (Signed)
Pt taken to CT.

## 2022-12-13 NOTE — H&P (Signed)
Chief Complaint: Patient was seen in consultation today for No chief complaint on file.  at the request of Ennever,Peter R  Referring Physician(s): Ennever,Peter R  Supervising Physician: {Supervising Physician:[id number]}  Patient Status: {IR Consult Patient Status:21879}  History of Present Illness: Richard Bowman is a 47 y.o. male ***  Past Medical History:  Diagnosis Date   Esophageal ulcer 2007   Renal disorder    CKD 10/28/20 "doesn't have it now"    Past Surgical History:  Procedure Laterality Date   BIOPSY  11/17/2022   Procedure: BIOPSY;  Surgeon: Napoleon Form, MD;  Location: [facility];  Service: Gastroenterology;;   COLON RESECTION SIGMOID N/A 11/18/2022   Procedure: OPEN SIGMOID COLECTOMY;  Surgeon: Manus Rudd, MD;  Location: [facility] OR;  Service: General;  Laterality: N/A;   COLONOSCOPY WITH PROPOFOL N/A 11/17/2022   Procedure: COLONOSCOPY WITH PROPOFOL;  Surgeon: Napoleon Form, MD;  Location: [facility];  Service: Gastroenterology;  Laterality: N/A;   ESOPHAGOGASTRODUODENOSCOPY (EGD) WITH PROPOFOL N/A 11/17/2022   Procedure: ESOPHAGOGASTRODUODENOSCOPY (EGD) WITH PROPOFOL;  Surgeon: Napoleon Form, MD;  Location: [facility];  Service: Gastroenterology;  Laterality: N/A;   LIVER BIOPSY N/A 11/18/2022   Procedure: LIVER BIOPSY;  Surgeon: Manus Rudd, MD;  Location: [facility] OR;  Service: General;  Laterality: N/A;   SUBMUCOSAL TATTOO INJECTION  11/17/2022   Procedure: SUBMUCOSAL TATTOO INJECTION;  Surgeon: Napoleon Form, MD;  Location: [facility];  Service: Gastroenterology;;    Allergies: Patient has no known allergies.  Medications: Prior to Admission medications   Medication Sig Start Date End Date Taking? Authorizing Provider  acetaminophen (TYLENOL) 500 MG tablet Take 2 tablets (1,000 mg total) by mouth every 6 (six) hours as needed for mild pain. 11/22/22   Meuth, Brooke A, PA-C  dexamethasone (DECADRON) 4 MG tablet Take 2 tablets  (8 mg total) by mouth daily for 3 days, starting the day after chemotherapy. Take with food. 12/01/22   Josph Macho, MD  docusate sodium (COLACE) 100 MG capsule Take 1 capsule (100 mg total) by mouth 2 (two) times daily. 11/23/22   Kathlen Mody, MD  feeding supplement (ENSURE ENLIVE / ENSURE PLUS) LIQD Take 237 mLs by mouth 2 (two) times daily between meals. Patient not taking: Reported on 12/01/2022 11/23/22   Kathlen Mody, MD  hydrocortisone (ANUSOL-HC) 2.5 % rectal cream Place rectally 3 (three) times daily as needed for hemorrhoids or anal itching. 11/23/22   Kathlen Mody, MD  ibuprofen (ADVIL) 200 MG tablet Take 3 tablets (600 mg total) by mouth every 6 (six) hours as needed for moderate pain. 11/22/22   Meuth, Lina Sar, PA-C  lactulose (CHRONULAC) 10 GM/15ML solution Take 15 mLs (10 g total) by mouth 3 (three) times daily. 12/04/22   Josph Macho, MD  loperamide (IMODIUM A-D) 2 MG tablet Take 2 tabs by mouth with first loose stool, then 1 tablet with each additional loose stool as needed. Do not exceed 8 tablets in a 24-hour period. 12/01/22   Josph Macho, MD  methocarbamol (ROBAXIN) 500 MG tablet Take 1 tablet (500 mg total) by mouth every 6 (six) hours as needed for muscle spasms. 11/23/22   Kathlen Mody, MD  nicotine (NICODERM CQ - DOSED IN MG/24 HOURS) 21 mg/24hr patch Place 1 patch (21 mg total) onto the skin daily. Patient not taking: Reported on 12/01/2022 11/23/22 11/23/23  Kathlen Mody, MD  ondansetron (ZOFRAN) 4 MG tablet Take 1 tablet (4 mg total) by mouth every 6 (six)  hours as needed for nausea. 11/23/22   Kathlen Mody, MD  ondansetron (ZOFRAN) 8 MG tablet Take 1 tablet (8 mg total) by mouth every 8 (eight) hours as needed for nausea or vomiting. Start on the third day after cisplatin. 12/01/22   Josph Macho, MD  Oxycodone HCl 10 MG TABS Take 0.5-1 tablets (5-10 mg total) by mouth every 6 (six) hours as needed. 12/01/22   Josph Macho, MD  polyethylene glycol (MIRALAX /  GLYCOLAX) 17 g packet Take 17 g by mouth daily as needed for mild constipation. 11/23/22   Kathlen Mody, MD  polyethylene glycol (MIRALAX MIX-IN PAX) 17 g packet Take 17 g by mouth daily. 12/13/22   Gailen Shelter, PA  prochlorperazine (COMPAZINE) 10 MG tablet Take 1 tablet (10 mg total) by mouth every 6 (six) hours as needed for nausea or vomiting. 12/01/22   Josph Macho, MD     Family History  Problem Relation Age of Onset   COPD Mother    Cancer Mother     Social History   Socioeconomic History   Marital status: Single    Spouse name: Not on file   Number of children: 0   Years of education: Not on file   Highest education level: Associate degree: occupational, Scientist, product/process development, or vocational program  Occupational History    Comment: NA   Occupation: Curator  Tobacco Use   Smoking status: Former    Packs/day: 1.00    Years: 26.00    Additional pack years: 0.00    Total pack years: 26.00    Types: Cigarettes    Quit date: 11/15/2022    Years since quitting: 0.0    Passive exposure: Current   Smokeless tobacco: Never   Tobacco comments:    Smoking 1/2 - 1 ppd  Vaping Use   Vaping Use: Former  Substance and Sexual Activity   Alcohol use: Yes    Alcohol/week: 0.0 standard drinks of alcohol    Comment: occ   Drug use: No   Sexual activity: Not Currently  Other Topics Concern   Not on file  Social History Narrative   Not on file   Social Determinants of Health   Financial Resource Strain: Medium Risk (12/02/2022)   Overall Financial Resource Strain (CARDIA)    Difficulty of Paying Living Expenses: Somewhat hard  Food Insecurity: Patient Declined (11/16/2022)   Hunger Vital Sign    Worried About Running Out of Food in the Last Year: Patient declined    Ran Out of Food in the Last Year: Patient declined  Transportation Needs: Patient Declined (11/16/2022)   PRAPARE - Administrator, Civil Service (Medical): Patient declined    Lack of Transportation  (Non-Medical): Patient declined  Physical Activity: Not on file  Stress: Not on file  Social Connections: Not on file    ECOG Status: {CHL ONC ECOG ZO:[id number]}  Review of Systems: A 12 point ROS discussed and pertinent positives are indicated in the HPI above.  All other systems are negative.  Review of Systems  Vital Signs: There were no vitals taken for this visit.    Physical Exam  Imaging: CT ABDOMEN PELVIS W CONTRAST  Result Date: 12/13/2022 CLINICAL DATA:  Abdominal pain, metastatic cancer EXAM: CT ABDOMEN AND PELVIS WITH CONTRAST TECHNIQUE: Multidetector CT imaging of the abdomen and pelvis was performed using the standard protocol following bolus administration of intravenous contrast. RADIATION DOSE REDUCTION: This exam was performed according to the departmental dose-optimization  program which includes automated exposure control, adjustment of the mA and/or kV according to patient size and/or use of iterative reconstruction technique. CONTRAST:  75mL OMNIPAQUE IOHEXOL 350 MG/ML SOLN COMPARISON:  11/16/2022 FINDINGS: Lower chest: Scarring/atelectasis in the right lower lobe. Hepatobiliary: Multifocal hepatic metastases, progressive. Index 9.3 cm lesion in the left hepatic lobe measures 9.3 cm (series 3/image 31), previously 7.6 cm. Index inferior right hepatic lobe metastasis measures 9.6 cm (series 3/image 40), previously 8.6 cm. Gallbladder is unremarkable. No intrahepatic or extrahepatic dilatation. Pancreas: Within normal limits. Spleen: Within normal limits. Adrenals/Urinary Tract: Adrenal glands are within normal limits. Kidneys with normal limits.  No hydronephrosis. Mildly thick-walled bladder, although underdistended. Stomach/Bowel: Stomach is within normal limits. No evidence of bowel obstruction. Normal appendix (series 3/image 72). Interval left hemicolectomy with suture line in the lower pelvis (series 3/image 37). Prior sigmoid colonic mass is no longer visualized.  Vascular/Lymphatic: No evidence of abdominal aortic aneurysm. Atherosclerotic calcifications of the abdominal aorta and branch vessels. No suspicious abdominopelvic lymphadenopathy. Specifically, the nodal soft tissue along the sigmoid mesocolon on the prior is no longer visualized, likely surgically absent. Reproductive: Prostate is unremarkable. Other: No abdominopelvic ascites. Musculoskeletal: Visualized osseous structures are within normal limits. IMPRESSION: Interval left hemicolectomy with resection of prior sigmoid colon mass and suspected resection of adjacent mesenteric nodal metastasis. Multifocal hepatic metastases, progressive. Electronically Signed   By: Charline Bills M.D.   On: 12/13/2022 02:49   DG Abd Portable 2V  Result Date: 11/17/2022 CLINICAL DATA:  Abdominal pain EXAM: PORTABLE ABDOMEN - 2 VIEW COMPARISON:  CT abdomen and pelvis dated 11/16/2022 FINDINGS: Nonobstructive bowel gas pattern. No free air or pneumatosis. No abnormal radio-opaque calculi or mass effect. No acute or substantial osseous abnormality. The sacrum and coccyx are partially obscured by overlying bowel contents. Partially imaged lung bases are clear. IMPRESSION: Nonobstructive bowel gas pattern. Electronically Signed   By: Agustin Cree M.D.   On: 11/17/2022 18:54   CT CHEST W CONTRAST  Result Date: 11/16/2022 CLINICAL DATA:  New diagnosis of stage IV colon cancer. Assess for thoracic disease. EXAM: CT CHEST WITH CONTRAST TECHNIQUE: Multidetector CT imaging of the chest was performed during intravenous contrast administration. RADIATION DOSE REDUCTION: This exam was performed according to the departmental dose-optimization program which includes automated exposure control, adjustment of the mA and/or kV according to patient size and/or use of iterative reconstruction technique. CONTRAST:  50mL OMNIPAQUE IOHEXOL 350 MG/ML SOLN COMPARISON:  None Available. FINDINGS: Cardiovascular: No significant vascular findings.  Normal heart size. No pericardial effusion. Mediastinum/Nodes: No enlarged mediastinal, hilar, or axillary lymph nodes. Thyroid gland, trachea, and esophagus demonstrate no significant findings. Lungs/Pleura: Mean 7 mm subpleural pulmonary nodule is seen within the basilar right middle lobe at axial image # 108/7. Similar 3 mm subpleural pulmonary nodule noted within the right lower lobe at axial image # 103/7. While not completely excluded, these are considered of very low likelihood to represent pulmonary metastatic disease. No additional focal pulmonary nodules or infiltrates. No pneumothorax or pleural effusion. Central airways are widely patent. Upper Abdomen: Multiple hypoattenuating masses are again identified throughout the liver, better seen on previously performed CT examination of the abdomen pelvis in keeping with bilobar hepatic metastatic disease. Musculoskeletal: No acute bone abnormality. No lytic or blastic bone lesion. IMPRESSION: 1. No definite evidence of metastatic disease within the chest. 2. Two subpleural pulmonary nodules within the right lung, considered of very low likelihood to represent pulmonary metastatic disease. Continued observation on subsequent surveillance CT would  be warranted. 3. Bilobar hepatic metastatic disease, better seen on previously performed CT examination of the abdomen pelvis. Electronically Signed   By: Helyn Numbers M.D.   On: 11/16/2022 23:25   CT ABDOMEN PELVIS W CONTRAST  Result Date: 11/16/2022 CLINICAL DATA:  Abdominal pain EXAM: CT ABDOMEN AND PELVIS WITH CONTRAST TECHNIQUE: Multidetector CT imaging of the abdomen and pelvis was performed using the standard protocol following bolus administration of intravenous contrast. RADIATION DOSE REDUCTION: This exam was performed according to the departmental dose-optimization program which includes automated exposure control, adjustment of the mA and/or kV according to patient size and/or use of iterative  reconstruction technique. CONTRAST:  75mL OMNIPAQUE IOHEXOL 350 MG/ML SOLN COMPARISON:  None Available. FINDINGS: Lower chest: No acute abnormality Hepatobiliary: Numerous large masses throughout the liver compatible with metastases. Index left hepatic mass on image 22 measures up to 7.6 cm. Index right hepatic mass on image 35 measures up to 8.6 cm. Gallbladder unremarkable. Pancreas: No focal abnormality or ductal dilatation. Spleen: No focal abnormality.  Normal size. Adrenals/Urinary Tract: No adrenal abnormality. No focal renal abnormality. No stones or hydronephrosis. Urinary bladder is unremarkable. Stomach/Bowel: No visible abnormality within the colon. However, there is ill-defined spiculated mass within the mesentery adjacent to the sigmoid colon measuring 2.9 x 2.1 cm. Given this finding and the masses within the liver, primary colon cancer is a concern. Stomach and small bowel decompressed, grossly unremarkable. Vascular/Lymphatic: Scattered aortic calcifications. No aneurysm. No retroperitoneal or inguinal adenopathy. Reproductive: No visible focal abnormality. Other: No free fluid or free air. Musculoskeletal: No acute bony abnormality or focal bone lesion. IMPRESSION: Numerous large enhancing masses within the liver most compatible with metastases. Primary source not clearly visualized. However, there is abnormal soft tissue in the mesentery leading to the sigmoid colon which is concerning for adenopathy, possibly related to sigmoid colon malignancy although not visualized by CT. Consider further evaluation with colonoscopy. Electronically Signed   By: Charlett Nose M.D.   On: 11/16/2022 02:21    Labs:  CBC: Recent Labs    11/19/22 0322 11/21/22 0138 12/01/22 1058 12/12/22 2338  WBC 16.0* 8.2 14.2* 14.8*  HGB 12.8* 11.0* 13.1 12.8*  HCT 37.2* 33.8* 38.9* 36.6*  PLT 323 275 468* 368    COAGS: Recent Labs    11/16/22 1256  INR 1.1    BMP: Recent Labs    11/20/22 0935  11/21/22 0138 12/01/22 1058 12/12/22 2338  NA 135 135 138 130*  K 4.0 4.1 4.0 3.6  CL 98 103 102 92*  CO2 GLUCOSE 92 85 150* 144*  BUN CALCIUM 9.0 8.9 10.1 9.7  CREATININE 1.15 1.03 1.05 1.19  GFRNONAA >60 >60 >60 >60    LIVER FUNCTION TESTS: Recent Labs    11/15/22 2208 11/18/22 0324 12/01/22 1058 12/12/22 2338  BILITOT 0.6 1.3* 0.6 0.9  AST 107* 80* 121* 110*  ALT 83* 52* 111* 60*  ALKPHOS 296* 246* 484* 420*  PROT 6.9 6.3* 7.6 7.4  ALBUMIN 3.4* 2.9* 3.9 3.2*      Assessment and Plan:  47 y.o. male outpatient. 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 Team is requesting a portacath for chemotherapy access.   T Chest from 3.18.24 shows RIJ is accessible. *** All labs and medications are within acceptable parameters. NKDA. Patient has been NPO since midnight.     Thank you for  this interesting consult.  I greatly enjoyed meeting YASEEN REIFEL and look forward to participating in their care.  A copy of this report was sent to the requesting provider on this date.  Electronically Signed: Alene Mires, NP 12/13/2022, 8:12 PM   I spent a total of {New LIDC:[id number]} {New Out-Pt:[id number]}  {Established Out-Pt:[id number]} in face to face in clinical consultation, greater than 50% of which was counseling/coordinating care for ***

## 2022-12-13 NOTE — ED Provider Notes (Signed)
[place] EMERGENCY DEPARTMENT AT [facility] Provider Note   CSN: [id number] Arrival date & time: 12/12/22  2323     History  Chief Complaint  Patient presents with   Abdominal Pain    Richard Bowman is a 47 y.o. male.   Abdominal Pain Patient is a 47 year old male with past medical history significant for metastatic adenocarcinoma of the colon/liver presented emergency room today with complaints of constipation  Patient had sigmoid colectomy and liver biopsy done 11/18/2022 he states that he had a few bowel movements after this surgery however states that his last bowel movement was 4/2.  He denies any tenesmus, denies any increased pain, fevers, urinary frequency urgency dysuria or hematuria.  No chest pain lightheadedness or dizziness.  He states he has had constipation issues since the surgery was done.     Home Medications Prior to Admission medications   Medication Sig Start Date End Date Taking? Authorizing Provider  polyethylene glycol (MIRALAX MIX-IN PAX) 17 g packet Take 17 g by mouth daily. 12/13/22  Yes Alexias Margerum, Stevphen Meuse S, PA  acetaminophen (TYLENOL) 500 MG tablet Take 2 tablets (1,000 mg total) by mouth every 6 (six) hours as needed for mild pain. 11/22/22   Meuth, Brooke A, PA-C  dexamethasone (DECADRON) 4 MG tablet Take 2 tablets (8 mg total) by mouth daily for 3 days, starting the day after chemotherapy. Take with food. 12/01/22   Josph Macho, MD  docusate sodium (COLACE) 100 MG capsule Take 1 capsule (100 mg total) by mouth 2 (two) times daily. 11/23/22   Kathlen Mody, MD  feeding supplement (ENSURE ENLIVE / ENSURE PLUS) LIQD Take 237 mLs by mouth 2 (two) times daily between meals. Patient not taking: Reported on 12/01/2022 11/23/22   Kathlen Mody, MD  hydrocortisone (ANUSOL-HC) 2.5 % rectal cream Place rectally 3 (three) times daily as needed for hemorrhoids or anal itching. 11/23/22   Kathlen Mody, MD  ibuprofen (ADVIL) 200 MG tablet Take 3 tablets (600  mg total) by mouth every 6 (six) hours as needed for moderate pain. 11/22/22   Meuth, Lina Sar, PA-C  lactulose (CHRONULAC) 10 GM/15ML solution Take 15 mLs (10 g total) by mouth 3 (three) times daily. 12/04/22   Josph Macho, MD  loperamide (IMODIUM A-D) 2 MG tablet Take 2 tabs by mouth with first loose stool, then 1 tablet with each additional loose stool as needed. Do not exceed 8 tablets in a 24-hour period. 12/01/22   Josph Macho, MD  methocarbamol (ROBAXIN) 500 MG tablet Take 1 tablet (500 mg total) by mouth every 6 (six) hours as needed for muscle spasms. 11/23/22   Kathlen Mody, MD  nicotine (NICODERM CQ - DOSED IN MG/24 HOURS) 21 mg/24hr patch Place 1 patch (21 mg total) onto the skin daily. Patient not taking: Reported on 12/01/2022 11/23/22 11/23/23  Kathlen Mody, MD  ondansetron (ZOFRAN) 4 MG tablet Take 1 tablet (4 mg total) by mouth every 6 (six) hours as needed for nausea. 11/23/22   Kathlen Mody, MD  ondansetron (ZOFRAN) 8 MG tablet Take 1 tablet (8 mg total) by mouth every 8 (eight) hours as needed for nausea or vomiting. Start on the third day after cisplatin. 12/01/22   Josph Macho, MD  Oxycodone HCl 10 MG TABS Take 0.5-1 tablets (5-10 mg total) by mouth every 6 (six) hours as needed. 12/01/22   Josph Macho, MD  polyethylene glycol (MIRALAX / GLYCOLAX) 17 g packet Take 17 g by mouth  daily as needed for mild constipation. 11/23/22   Kathlen Mody, MD  prochlorperazine (COMPAZINE) 10 MG tablet Take 1 tablet (10 mg total) by mouth every 6 (six) hours as needed for nausea or vomiting. 12/01/22   Josph Macho, MD      Allergies    Patient has no known allergies.    Review of Systems   Review of Systems  Gastrointestinal:  Positive for abdominal pain.    Physical Exam Updated Vital Signs BP 123/80 (BP Location: Left Arm)   Pulse 88   Temp 98.3 F (36.8 C) (Oral)   Resp 16   SpO2 100%  Physical Exam Vitals and nursing note reviewed.  Constitutional:      General: He  is not in acute distress.    Comments: Nontoxic-appearing 47 year old male  HENT:     Head: Normocephalic and atraumatic.     Nose: Nose normal.  Eyes:     General: No scleral icterus. Cardiovascular:     Rate and Rhythm: Normal rate and regular rhythm.     Pulses: Normal pulses.     Heart sounds: Normal heart sounds.     Comments: Tachycardia is not present heart rate is between 80 and 90 Pulmonary:     Effort: Pulmonary effort is normal. No respiratory distress.     Breath sounds: No wheezing.  Abdominal:     Palpations: Abdomen is soft.     Tenderness: There is no abdominal tenderness.     Comments: Mild epigastric tenderness and some umbilical tenderness  Skin of the abdomen with well-healing surgical scar  Musculoskeletal:     Cervical back: Normal range of motion.     Right lower leg: No edema.     Left lower leg: No edema.  Skin:    General: Skin is warm and dry.     Capillary Refill: Capillary refill takes less than 2 seconds.  Neurological:     Mental Status: He is alert. Mental status is at baseline.  Psychiatric:        Mood and Affect: Mood normal.        Behavior: Behavior normal.     ED Results / Procedures / Treatments   Labs (all labs ordered are listed, but only abnormal results are displayed) Labs Reviewed  CBC WITH DIFFERENTIAL/PLATELET - Abnormal; Notable for the following components:      Result Value   WBC 14.8 (*)    Hemoglobin 12.8 (*)    HCT 36.6 (*)    Neutro Abs 10.4 (*)    Monocytes Absolute 1.1 (*)    All other components within normal limits  COMPREHENSIVE METABOLIC PANEL - Abnormal; Notable for the following components:   Sodium 130 (*)    Chloride 92 (*)    Glucose, Bld 144 (*)    Albumin 3.2 (*)    AST 110 (*)    ALT 60 (*)    Alkaline Phosphatase 420 (*)    All other components within normal limits  LIPASE, BLOOD    EKG None  Radiology CT ABDOMEN PELVIS W CONTRAST  Result Date: 12/13/2022 CLINICAL DATA:  Abdominal  pain, metastatic cancer EXAM: CT ABDOMEN AND PELVIS WITH CONTRAST TECHNIQUE: Multidetector CT imaging of the abdomen and pelvis was performed using the standard protocol following bolus administration of intravenous contrast. RADIATION DOSE REDUCTION: This exam was performed according to the departmental dose-optimization program which includes automated exposure control, adjustment of the mA and/or kV according to patient size and/or use of  iterative reconstruction technique. CONTRAST:  34mL OMNIPAQUE IOHEXOL 350 MG/ML SOLN COMPARISON:  11/16/2022 FINDINGS: Lower chest: Scarring/atelectasis in the right lower lobe. Hepatobiliary: Multifocal hepatic metastases, progressive. Index 9.3 cm lesion in the left hepatic lobe measures 9.3 cm (series 3/image 31), previously 7.6 cm. Index inferior right hepatic lobe metastasis measures 9.6 cm (series 3/image 40), previously 8.6 cm. Gallbladder is unremarkable. No intrahepatic or extrahepatic dilatation. Pancreas: Within normal limits. Spleen: Within normal limits. Adrenals/Urinary Tract: Adrenal glands are within normal limits. Kidneys with normal limits.  No hydronephrosis. Mildly thick-walled bladder, although underdistended. Stomach/Bowel: Stomach is within normal limits. No evidence of bowel obstruction. Normal appendix (series 3/image 72). Interval left hemicolectomy with suture line in the lower pelvis (series 3/image 37). Prior sigmoid colonic mass is no longer visualized. Vascular/Lymphatic: No evidence of abdominal aortic aneurysm. Atherosclerotic calcifications of the abdominal aorta and branch vessels. No suspicious abdominopelvic lymphadenopathy. Specifically, the nodal soft tissue along the sigmoid mesocolon on the prior is no longer visualized, likely surgically absent. Reproductive: Prostate is unremarkable. Other: No abdominopelvic ascites. Musculoskeletal: Visualized osseous structures are within normal limits. IMPRESSION: Interval left hemicolectomy with  resection of prior sigmoid colon mass and suspected resection of adjacent mesenteric nodal metastasis. Multifocal hepatic metastases, progressive. Electronically Signed   By: Charline Bills M.D.   On: 12/13/2022 02:49    Procedures Procedures    Medications Ordered in ED Medications  sodium chloride 0.9 % bolus 1,000 mL (0 mLs Intravenous Stopped 12/13/22 0217)  iohexol (OMNIPAQUE) 350 MG/ML injection 75 mL (75 mLs Intravenous Contrast Given 12/13/22 0236)    ED Course/ Medical Decision Making/ A&P Clinical Course as of 12/13/22 0727  Sun Dec 13, 2022  0024 Constipation -- no BM since 4/2. Lactulose for a few days (5x lactulose yesterday). Not more pain than before.  [WF]    Clinical Course User Index [WF] Gailen Shelter, PA                             Medical Decision Making Amount and/or Complexity of Data Reviewed Labs: ordered. Radiology: ordered.  Risk Prescription drug management.   Patient is a 47 year old male with past medical history significant for metastatic adenocarcinoma of the colon/liver presented emergency room today with complaints of constipation  Patient had sigmoid colectomy and liver biopsy done 11/18/2022 he states that he had a few bowel movements after this surgery however states that his last bowel movement was 4/2.  He denies any tenesmus, denies any increased pain, fevers, urinary frequency urgency dysuria or hematuria.  No chest pain lightheadedness or dizziness.  He states he has had constipation issues since the surgery was done.  Physical exam without any significant abdominal tenderness.  No guarding or rebound clean well-healing surgical scar.  CBC with leukocytosis is not significantly changed from prior lipase within normal limits CMP with mild hyponatremia hypochloremia likely due to some dehydration.  Transaminases and alk phos elevated consistent with known liver disease  IMPRESSION:  Interval left hemicolectomy with resection of prior  sigmoid colon  mass and suspected resection of adjacent mesenteric nodal  metastasis.    Multifocal hepatic metastases, progressive.      Electronically Signed    By: Charline Bills M.D.    On: 12/13/2022 02:49   I discussed this case with my attending physician who cosigned this note including patient's presenting symptoms, physical exam, and planned diagnostics and interventions. Attending physician stated agreement with plan or made  changes to plan which were implemented.   Given 1 L normal saline for hydration, counseled at length about treatment of constipation.  He does have a large amount of stool in the colon diffusely.  No focal stool ball that I can disimpact.  Hydrated here, recommended bowel regimen at home he will follow-up with his outpatient team.  I suspect that his pain regimen including oxycodone is influencing this.  He states he can down regulate the amount of Percocet he is taking as his pain has been very under control.  Tolerating p.o. well-appearing at time of discharge.  Will discharge home at this time.   Final Clinical Impression(s) / ED Diagnoses Final diagnoses:  Drug-induced constipation    Rx / DC Orders ED Discharge Orders          Ordered    polyethylene glycol (MIRALAX MIX-IN PAX) 17 g packet  Daily        12/13/22 0405              Gailen Shelter, [place] 12/13/22 0729    Nira Conn, MD 12/13/22 [phone]

## 2022-12-14 ENCOUNTER — Other Ambulatory Visit: Payer: Self-pay

## 2022-12-14 ENCOUNTER — Other Ambulatory Visit: Payer: Self-pay | Admitting: Hematology & Oncology

## 2022-12-14 ENCOUNTER — Ambulatory Visit (HOSPITAL_COMMUNITY)
Admission: RE | Admit: 2022-12-14 | Discharge: 2022-12-14 | Disposition: A | Discharge status: Home / Self Care | Payer: Medicaid Other | Source: Ambulatory Visit | Attending: Hematology & Oncology | Admitting: Hematology & Oncology

## 2022-12-14 DIAGNOSIS — C787 Secondary malignant neoplasm of liver and intrahepatic bile duct: Secondary | ICD-10-CM

## 2022-12-14 DIAGNOSIS — C189 Malignant neoplasm of colon, unspecified: Secondary | ICD-10-CM | POA: Insufficient documentation

## 2022-12-14 HISTORY — PX: IR IMAGING GUIDED PORT INSERTION: IMG5740

## 2022-12-14 MED ORDER — FENTANYL CITRATE (PF) 100 MCG/2ML IJ SOLN
INTRAMUSCULAR | Status: AC
  Filled 2022-12-14: qty 2

## 2022-12-14 MED ORDER — MIDAZOLAM HCL 2 MG/2ML IJ SOLN
INTRAMUSCULAR | Status: AC
  Filled 2022-12-14: qty 2

## 2022-12-14 MED ORDER — HEPARIN SOD (PORK) LOCK FLUSH 100 UNIT/ML IV SOLN
INTRAVENOUS | Status: AC
  Filled 2022-12-14: qty 5

## 2022-12-14 MED ORDER — FENTANYL CITRATE (PF) 100 MCG/2ML IJ SOLN
INTRAMUSCULAR | Status: AC | PRN
  Administered 2022-12-14 (×3): 50 ug via INTRAVENOUS

## 2022-12-14 MED ORDER — SODIUM CHLORIDE 0.9 % IV SOLN: INTRAVENOUS | Status: DC

## 2022-12-14 MED ORDER — MIDAZOLAM HCL 2 MG/2ML IJ SOLN
INTRAMUSCULAR | Status: AC | PRN
  Administered 2022-12-14 (×3): 1 mg via INTRAVENOUS

## 2022-12-14 MED ORDER — LIDOCAINE-EPINEPHRINE 1 %-1:100000 IJ SOLN
INTRAMUSCULAR | Status: AC
  Filled 2022-12-14: qty 1

## 2022-12-14 NOTE — Procedures (Signed)
Interventional Radiology Procedure Note  Procedure: RT IJ POWER PORT    Complications: None  Estimated Blood Loss:  MIN  Findings: TIP SVCRA    M. TREVOR Pranshu Lyster, MD    

## 2022-12-16 ENCOUNTER — Inpatient Hospital Stay: Payer: Medicaid Other | Admitting: Licensed Clinical Social Worker

## 2022-12-16 ENCOUNTER — Inpatient Hospital Stay: Payer: Medicaid Other

## 2022-12-16 ENCOUNTER — Other Ambulatory Visit: Payer: Self-pay | Admitting: *Deleted

## 2022-12-16 ENCOUNTER — Other Ambulatory Visit (HOSPITAL_BASED_OUTPATIENT_CLINIC_OR_DEPARTMENT_OTHER): Payer: Self-pay

## 2022-12-16 ENCOUNTER — Other Ambulatory Visit: Payer: Self-pay | Admitting: Family

## 2022-12-16 ENCOUNTER — Telehealth: Payer: Self-pay | Admitting: Genetic Counselor

## 2022-12-16 ENCOUNTER — Encounter: Payer: Self-pay | Admitting: *Deleted

## 2022-12-16 ENCOUNTER — Other Ambulatory Visit: Payer: Self-pay

## 2022-12-16 ENCOUNTER — Encounter: Payer: Self-pay | Admitting: Hematology & Oncology

## 2022-12-16 VITALS — BP 116/63 | HR 75 | Temp 98.0°F | Resp 20

## 2022-12-16 DIAGNOSIS — C189 Malignant neoplasm of colon, unspecified: Secondary | ICD-10-CM

## 2022-12-16 DIAGNOSIS — B370 Candidal stomatitis: Secondary | ICD-10-CM

## 2022-12-16 DIAGNOSIS — Z5111 Encounter for antineoplastic chemotherapy: Secondary | ICD-10-CM | POA: Diagnosis not present

## 2022-12-16 LAB — CBC WITH DIFFERENTIAL (CANCER CENTER ONLY)
Abs Immature Granulocytes: 0.06 10*3/uL (ref 0.00–0.07)
Basophils Absolute: 0 10*3/uL (ref 0.0–0.1)
Basophils Relative: 0 %
Eosinophils Absolute: 0.7 10*3/uL — ABNORMAL HIGH (ref 0.0–0.5)
Eosinophils Relative: 5 %
HCT: 37.5 % — ABNORMAL LOW (ref 39.0–52.0)
Hemoglobin: 12.5 g/dL — ABNORMAL LOW (ref 13.0–17.0)
Immature Granulocytes: 1 %
Lymphocytes Relative: 15 %
Lymphs Abs: 1.9 10*3/uL (ref 0.7–4.0)
MCH: 29.1 pg (ref 26.0–34.0)
MCHC: 33.3 g/dL (ref 30.0–36.0)
MCV: 87.4 fL (ref 80.0–100.0)
Monocytes Absolute: 1.1 10*3/uL — ABNORMAL HIGH (ref 0.1–1.0)
Monocytes Relative: 9 %
Neutro Abs: 9.2 10*3/uL — ABNORMAL HIGH (ref 1.7–7.7)
Neutrophils Relative %: 70 %
Platelet Count: 324 10*3/uL (ref 150–400)
RBC: 4.29 MIL/uL (ref 4.22–5.81)
RDW: 12.6 % (ref 11.5–15.5)
WBC Count: 13.1 10*3/uL — ABNORMAL HIGH (ref 4.0–10.5)
nRBC: 0 % (ref 0.0–0.2)

## 2022-12-16 LAB — CMP (CANCER CENTER ONLY)
ALT: 63 U/L — ABNORMAL HIGH (ref 0–44)
AST: 109 U/L — ABNORMAL HIGH (ref 15–41)
Albumin: 3.4 g/dL — ABNORMAL LOW (ref 3.5–5.0)
Alkaline Phosphatase: 475 U/L — ABNORMAL HIGH (ref 38–126)
Anion gap: 8 (ref 5–15)
BUN: 11 mg/dL (ref 6–20)
CO2: 28 mmol/L (ref 22–32)
Calcium: 9.6 mg/dL (ref 8.9–10.3)
Chloride: 101 mmol/L (ref 98–111)
Creatinine: 1.1 mg/dL (ref 0.61–1.24)
GFR, Estimated: >60 mL/min (ref >60)
Glucose, Bld: 90 mg/dL (ref 70–99)
Potassium: 4.3 mmol/L (ref 3.5–5.1)
Sodium: 137 mmol/L (ref 135–145)
Total Bilirubin: 0.5 mg/dL (ref 0.3–1.2)
Total Protein: 7.2 g/dL (ref 6.5–8.1)

## 2022-12-16 MED ORDER — SODIUM CHLORIDE 0.9% FLUSH
10.0000 mL | Freq: Once | INTRAVENOUS | Status: AC
  Administered 2022-12-16: 10 mL

## 2022-12-16 MED ORDER — ATROPINE SULFATE 1 MG/ML IV SOLN
0.5000 mg | Freq: Once | INTRAVENOUS | Status: AC | PRN
  Administered 2022-12-16: 0.5 mg via INTRAVENOUS
  Filled 2022-12-16: qty 1

## 2022-12-16 MED ORDER — OXALIPLATIN CHEMO INJECTION 100 MG/20ML
85.0000 mg/m2 | Freq: Once | INTRAVENOUS | Status: AC
  Administered 2022-12-16: 175 mg via INTRAVENOUS
  Filled 2022-12-16: qty 35

## 2022-12-16 MED ORDER — SODIUM CHLORIDE 0.9 % IV SOLN
2400.0000 mg/m2 | INTRAVENOUS | Status: DC
  Administered 2022-12-16: 5000 mg via INTRAVENOUS
  Filled 2022-12-16: qty 100

## 2022-12-16 MED ORDER — SODIUM CHLORIDE 0.9 % IV SOLN
150.0000 mg | Freq: Once | INTRAVENOUS | Status: AC
  Administered 2022-12-16: 150 mg via INTRAVENOUS
  Filled 2022-12-16: qty 150

## 2022-12-16 MED ORDER — DEXTROSE 5 % IV SOLN: Freq: Once | INTRAVENOUS | Status: AC

## 2022-12-16 MED ORDER — NYSTATIN 100000 UNIT/ML MT SUSP
5.0000 mL | Freq: Four times a day (QID) | OROMUCOSAL | 1 refills | Status: AC
Start: 2022-12-16 — End: ?
  Filled 2022-12-16: qty 473, 24d supply, fill #0

## 2022-12-16 MED ORDER — OXYCODONE HCL 5 MG PO TABS
10.0000 mg | ORAL_TABLET | Freq: Once | ORAL | Status: AC
  Administered 2022-12-16: 10 mg via ORAL
  Filled 2022-12-16: qty 2

## 2022-12-16 MED ORDER — FLUCONAZOLE 100 MG PO TABS
100.0000 mg | ORAL_TABLET | Freq: Every day | ORAL | 1 refills | Status: DC
Start: 2022-12-16 — End: 2023-01-12
  Filled 2022-12-16: qty 30, 30d supply, fill #0

## 2022-12-16 MED ORDER — LIDOCAINE-PRILOCAINE 2.5-2.5 % EX CREA
1.0000 | TOPICAL_CREAM | CUTANEOUS | 0 refills | Status: DC | PRN
  Filled 2022-12-16: qty 30, 15d supply, fill #0

## 2022-12-16 MED ORDER — SODIUM CHLORIDE 0.9 % IV SOLN
10.0000 mg | Freq: Once | INTRAVENOUS | Status: AC
  Administered 2022-12-16: 10 mg via INTRAVENOUS
  Filled 2022-12-16: qty 10

## 2022-12-16 MED ORDER — PALONOSETRON HCL INJECTION 0.25 MG/5ML
0.2500 mg | Freq: Once | INTRAVENOUS | Status: AC
  Administered 2022-12-16: 0.25 mg via INTRAVENOUS
  Filled 2022-12-16: qty 5

## 2022-12-16 MED ORDER — LEUCOVORIN CALCIUM INJECTION 350 MG
400.0000 mg/m2 | Freq: Once | INTRAVENOUS | Status: AC
  Administered 2022-12-16: 824 mg via INTRAVENOUS
  Filled 2022-12-16: qty 41.2

## 2022-12-16 MED ORDER — SODIUM CHLORIDE 0.9 % IV SOLN
165.0000 mg/m2 | Freq: Once | INTRAVENOUS | Status: AC
  Administered 2022-12-16: 340 mg via INTRAVENOUS
  Filled 2022-12-16: qty 15

## 2022-12-16 NOTE — Progress Notes (Signed)
OK to start pre meds prior to CMET resulting per order of S. Montez Morita NP.

## 2022-12-16 NOTE — Telephone Encounter (Signed)
Invitae kit drawn by Christean Leaf at Dr. Gustavo Lah office for early onset metastatic colon cancer.  She will put in a genetic counseling referral as well.  Results will be entered into S drive to await the appoitment.

## 2022-12-16 NOTE — Patient Instructions (Signed)

## 2022-12-16 NOTE — Progress Notes (Signed)
[facility] CSW Progress Note  Visual merchandiser met with patient in infusion to assess needs.  He stated he has periods where he does not feel well.  He will sleep and that seems to help.    He continues staying with his sister during treatment.  Discussed food and transportation needs.  Provided two [employer].  His medicaid and disability are still pending.  No other needs expressed.  Provided active listening and supportive counseling.    Richard Lux Aaidyn San, LCSW

## 2022-12-16 NOTE — Progress Notes (Signed)
OK to treat today with AST-109 per order of S. Montez Morita NP.

## 2022-12-16 NOTE — Patient Instructions (Signed)
[facility]  Discharge Instructions: Thank you for choosing [facility] to provide your oncology and hematology care.   If you have a lab appointment with the Cancer Center, please go directly to the Cancer Center and check in at the registration area.  Wear comfortable clothing and clothing appropriate for easy access to any Portacath or PICC line.   We strive to give you quality time with your provider. You may need to reschedule your appointment if you arrive late (15 or more minutes).  Arriving late affects you and other patients whose appointments are after yours.  Also, if you miss three or more appointments without notifying the office, you may be dismissed from the clinic at the provider's discretion.      For prescription refill requests, have your pharmacy contact our office and allow 72 hours for refills to be completed.    Today you received the following chemotherapy and/or immunotherapy agents:  Oxaliplatin, Irinotecan, Leucovorin and 5FU.         To help prevent nausea and vomiting after your treatment, we encourage you to take your nausea medication as directed.  BELOW ARE SYMPTOMS THAT SHOULD BE REPORTED IMMEDIATELY: *FEVER GREATER THAN 100.4 F (38 C) OR HIGHER *CHILLS OR SWEATING *NAUSEA AND VOMITING THAT IS NOT CONTROLLED WITH YOUR NAUSEA MEDICATION *UNUSUAL SHORTNESS OF BREATH *UNUSUAL BRUISING OR BLEEDING *URINARY PROBLEMS (pain or burning when urinating, or frequent urination) *BOWEL PROBLEMS (unusual diarrhea, constipation, pain near the anus) TENDERNESS IN MOUTH AND THROAT WITH OR WITHOUT PRESENCE OF ULCERS (sore throat, sores in mouth, or a toothache) UNUSUAL RASH, SWELLING OR PAIN  UNUSUAL VAGINAL DISCHARGE OR ITCHING   Items with * indicate a potential emergency and should be followed up as soon as possible or go to the Emergency Department if any problems should occur.  Please show the CHEMOTHERAPY ALERT  CARD or IMMUNOTHERAPY ALERT CARD at check-in to the Emergency Department and triage nurse. Should you have questions after your visit or need to cancel or reschedule your appointment, please contact [facility]  [phone] and follow the prompts.  Office hours are 8:00 a.m. to 4:30 p.m. Monday - Friday. Please note that voicemails left after 4:00 p.m. may not be returned until the following business day.  We are closed weekends and major holidays. You have access to a nurse at all times for urgent questions. Please call the main number to the clinic [phone] and follow the prompts.  For any non-urgent questions, you may also contact your provider using MyChart. We now offer e-Visits for anyone 18 and older to request care online for non-urgent symptoms. For details visit [email or web address].   Also download the MyChart app! Go to the app store, search "MyChart", open the app, select [facility], and log in with your MyChart username and password.   

## 2022-12-16 NOTE — Progress Notes (Signed)
1140-Pt complaining of "feeling hot" and noted to be diaphoretic. VS stable. Pharmacy notified and Atropine given as prescribed by Dr. Myna Hidalgo.

## 2022-12-16 NOTE — Progress Notes (Signed)
Patient is here to begin treatment. His bowels have been moving the last few days and he feels much better. He is anxious, but ready to start treatment.   Discussed again with him his qualification for genetic testing. He has decided to get testing done. Referral placed, invitae kit drawn and shipped, and message sent to [employer]. Financial aid paperwork for testing also completed as patient currently is uninsured.   Oncology Nurse Navigator Documentation     12/16/2022    9:00 AM  Oncology Nurse Navigator Flowsheets  Phase of Treatment Chemo  Chemotherapy Actual Start Date: 12/16/2022  Navigator Follow Up Date: 12/29/2022  Navigator Follow Up Reason: Follow-up Appointment;Chemotherapy  Navigator Location [facility]  Navigator Encounter Type Treatment  Treatment Initiated Date 12/16/2022  Patient Visit Type MedOnc  Treatment Phase First Chemo Tx  Barriers/Navigation Needs Coordination of Care;Education;Financial Toxicity;Unemployed  Interventions Coordination of Care;Education;Psycho-Social Support;Referrals  Acuity Level 3-Moderate Needs (3-4 Barriers Identified)  Referrals Genetics  Coordination of Care Other  Education Method Verbal  Time Spent with Patient 30

## 2022-12-16 NOTE — Progress Notes (Signed)
Fluorouracil pump decreased to 2400 mg/m2 per instructions of Eileen Stanford, NP.

## 2022-12-17 ENCOUNTER — Inpatient Hospital Stay: Payer: Medicaid Other

## 2022-12-17 ENCOUNTER — Encounter (HOSPITAL_COMMUNITY): Payer: Self-pay | Admitting: Emergency Medicine

## 2022-12-17 ENCOUNTER — Emergency Department (HOSPITAL_COMMUNITY): Payer: Medicaid Other

## 2022-12-17 ENCOUNTER — Other Ambulatory Visit: Payer: Self-pay

## 2022-12-17 ENCOUNTER — Inpatient Hospital Stay (HOSPITAL_COMMUNITY)
Admission: EM | Admit: 2022-12-17 | Discharge: 2022-12-20 | DRG: 640 | Disposition: A | Discharge status: Home / Self Care | Payer: Medicaid Other | Attending: Internal Medicine | Admitting: Internal Medicine

## 2022-12-17 DIAGNOSIS — N182 Chronic kidney disease, stage 2 (mild): Secondary | ICD-10-CM | POA: Diagnosis present

## 2022-12-17 DIAGNOSIS — E875 Hyperkalemia: Principal | ICD-10-CM | POA: Diagnosis present

## 2022-12-17 DIAGNOSIS — Z87891 Personal history of nicotine dependence: Secondary | ICD-10-CM

## 2022-12-17 DIAGNOSIS — E883 Tumor lysis syndrome: Secondary | ICD-10-CM | POA: Diagnosis present

## 2022-12-17 DIAGNOSIS — Z8719 Personal history of other diseases of the digestive system: Secondary | ICD-10-CM

## 2022-12-17 DIAGNOSIS — C7801 Secondary malignant neoplasm of right lung: Secondary | ICD-10-CM | POA: Diagnosis present

## 2022-12-17 DIAGNOSIS — R001 Bradycardia, unspecified: Secondary | ICD-10-CM | POA: Diagnosis present

## 2022-12-17 DIAGNOSIS — Z825 Family history of asthma and other chronic lower respiratory diseases: Secondary | ICD-10-CM

## 2022-12-17 DIAGNOSIS — R531 Weakness: Secondary | ICD-10-CM

## 2022-12-17 DIAGNOSIS — R079 Chest pain, unspecified: Secondary | ICD-10-CM | POA: Diagnosis present

## 2022-12-17 DIAGNOSIS — N179 Acute kidney failure, unspecified: Secondary | ICD-10-CM | POA: Diagnosis present

## 2022-12-17 DIAGNOSIS — D630 Anemia in neoplastic disease: Secondary | ICD-10-CM | POA: Diagnosis present

## 2022-12-17 DIAGNOSIS — E2740 Unspecified adrenocortical insufficiency: Secondary | ICD-10-CM | POA: Diagnosis present

## 2022-12-17 DIAGNOSIS — Z79899 Other long term (current) drug therapy: Secondary | ICD-10-CM

## 2022-12-17 DIAGNOSIS — R0789 Other chest pain: Secondary | ICD-10-CM | POA: Diagnosis present

## 2022-12-17 DIAGNOSIS — E860 Dehydration: Secondary | ICD-10-CM | POA: Diagnosis present

## 2022-12-17 DIAGNOSIS — R7989 Other specified abnormal findings of blood chemistry: Secondary | ICD-10-CM | POA: Diagnosis present

## 2022-12-17 DIAGNOSIS — C787 Secondary malignant neoplasm of liver and intrahepatic bile duct: Secondary | ICD-10-CM | POA: Diagnosis present

## 2022-12-17 DIAGNOSIS — Z85038 Personal history of other malignant neoplasm of large intestine: Secondary | ICD-10-CM

## 2022-12-17 DIAGNOSIS — R066 Hiccough: Secondary | ICD-10-CM | POA: Diagnosis present

## 2022-12-17 DIAGNOSIS — K219 Gastro-esophageal reflux disease without esophagitis: Secondary | ICD-10-CM | POA: Diagnosis present

## 2022-12-17 DIAGNOSIS — Z95828 Presence of other vascular implants and grafts: Secondary | ICD-10-CM

## 2022-12-17 DIAGNOSIS — E872 Acidosis, unspecified: Secondary | ICD-10-CM | POA: Diagnosis present

## 2022-12-17 DIAGNOSIS — Z9049 Acquired absence of other specified parts of digestive tract: Secondary | ICD-10-CM

## 2022-12-17 HISTORY — DX: Malignant (primary) neoplasm, unspecified: C80.1

## 2022-12-17 LAB — URINALYSIS, ROUTINE W REFLEX MICROSCOPIC
Bacteria, UA: NONE SEEN
Bilirubin Urine: NEGATIVE
Glucose, UA: NEGATIVE mg/dL
Hgb urine dipstick: NEGATIVE
Ketones, ur: NEGATIVE mg/dL
Leukocytes,Ua: NEGATIVE
Nitrite: NEGATIVE
Protein, ur: 30 mg/dL — AB
Specific Gravity, Urine: 1.016 (ref 1.005–1.030)
pH: 5 (ref 5.0–8.0)

## 2022-12-17 LAB — BASIC METABOLIC PANEL
Anion gap: 20 — ABNORMAL HIGH (ref 5–15)
BUN: 27 mg/dL — ABNORMAL HIGH (ref 6–20)
CO2: 16 mmol/L — ABNORMAL LOW (ref 22–32)
Calcium: 10.5 mg/dL — ABNORMAL HIGH (ref 8.9–10.3)
Chloride: 100 mmol/L (ref 98–111)
Creatinine, Ser: 1.28 mg/dL — ABNORMAL HIGH (ref 0.61–1.24)
GFR, Estimated: >60 mL/min (ref >60)
Glucose, Bld: 126 mg/dL — ABNORMAL HIGH (ref 70–99)
Potassium: 5.5 mmol/L — ABNORMAL HIGH (ref 3.5–5.1)
Sodium: 136 mmol/L (ref 135–145)

## 2022-12-17 LAB — CBC
HCT: 47.7 % (ref 39.0–52.0)
Hemoglobin: 14.6 g/dL (ref 13.0–17.0)
MCH: 29 pg (ref 26.0–34.0)
MCHC: 30.6 g/dL (ref 30.0–36.0)
MCV: 94.6 fL (ref 80.0–100.0)
Platelets: 255 10*3/uL (ref 150–400)
RBC: 5.04 MIL/uL (ref 4.22–5.81)
RDW: 12.8 % (ref 11.5–15.5)
WBC: 19.6 10*3/uL — ABNORMAL HIGH (ref 4.0–10.5)
nRBC: 0 % (ref 0.0–0.2)

## 2022-12-17 LAB — TROPONIN I (HIGH SENSITIVITY): Troponin I (High Sensitivity): 6 ng/L (ref <18)

## 2022-12-17 MED ORDER — IOHEXOL 350 MG/ML SOLN
75.0000 mL | Freq: Once | INTRAVENOUS | Status: AC | PRN
  Administered 2022-12-17: 75 mL via INTRAVENOUS

## 2022-12-17 MED ORDER — SODIUM CHLORIDE 0.9 % IV BOLUS
1000.0000 mL | Freq: Once | INTRAVENOUS | Status: AC
  Administered 2022-12-17: 1000 mL via INTRAVENOUS

## 2022-12-17 NOTE — ED Notes (Signed)
Patient transported to CT scan . 

## 2022-12-17 NOTE — ED Provider Notes (Signed)
[place] [facility]GENC[facility] DE[facility]MENT AT [facility] Provider Note   CSN: [id number] Arrival date & time: 12/17/22  2129     History  Chief Complaint  Patient presents with   Shortness of Breath    Richard Bowman is a 47 y.o. male.   Shortness of Breath  Patient is a 47 year old male with a past medical history significant for metastatic colon cancer with mets to liver and possibly some mets to lungs (pulmonary nodules x2).   Patient presents emergency room today for complaints of sharp left-sided chest pain and shortness of breath that started around 9:30 PM.  He states that his symptoms persisted for approximately 2 hours but seem to have mostly resolved at this time.  Denies any nausea or vomiting.  He was seen 4/13 by me in the emergency room for constipation he states he has had bowel movements daily since that time.  No fevers or chills, no urinary frequency urgency dysuria or hematuria.  States all symptoms are resolved currently and he feels well.    Home Medications Prior to Admission medications   Medication Sig Start Date End Date Taking? Authorizing Provider  acetaminophen (TYLENOL) 500 MG tablet Take 2 tablets (1,000 mg total) by mouth every 6 (six) hours as needed for mild pain. 11/22/22   Meuth, Brooke A, PA-C  dexamethasone (DECADRON) 4 MG tablet Take 2 tablets (8 mg total) by mouth daily for 3 days, starting the day after chemotherapy. Take with food. 12/01/22   Josph Macho, MD  docusate sodium (COLACE) 100 MG capsule Take 1 capsule (100 mg total) by mouth 2 (two) times daily. 11/23/22   Kathlen Mody, MD  feeding supplement (ENSURE ENLIVE / ENSURE PLUS) LIQD Take 237 mLs by mouth 2 (two) times daily between meals. Patient not taking: Reported on 12/01/2022 11/23/22   Kathlen Mody, MD  fluconazole (DIFLUCAN) 100 MG tablet Take 1 tablet (100 mg total) by mouth daily. 12/16/22   Erenest Blank, NP  hydrocortisone (ANUSOL-HC) 2.5 % rectal cream Place rectally  3 (three) times daily as needed for hemorrhoids or anal itching. 11/23/22   Kathlen Mody, MD  ibuprofen (ADVIL) 200 MG tablet Take 3 tablets (600 mg total) by mouth every 6 (six) hours as needed for moderate pain. 11/22/22   Meuth, Lina Sar, PA-C  lactulose (CHRONULAC) 10 GM/15ML solution Take 15 mLs (10 g total) by mouth 3 (three) times daily. 12/04/22   Josph Macho, MD  lidocaine-prilocaine (EMLA) cream Apply 1 Application topically as needed. 12/16/22   Erenest Blank, NP  loperamide (IMODIUM A-D) 2 MG tablet Take 2 tabs by mouth with first loose stool, then 1 tablet with each additional loose stool as needed. Do not exceed 8 tablets in a 24-hour period. 12/01/22   Josph Macho, MD  methocarbamol (ROBAXIN) 500 MG tablet Take 1 tablet (500 mg total) by mouth every 6 (six) hours as needed for muscle spasms. 11/23/22   Kathlen Mody, MD  nicotine (NICODERM CQ - DOSED IN MG/24 HOURS) 21 mg/24hr patch Place 1 patch (21 mg total) onto the skin daily. Patient not taking: Reported on 12/01/2022 11/23/22 11/23/23  Kathlen Mody, MD  nystatin (MYCOSTATIN) 100000 UNIT/ML suspension Take 5 mLs (500,000 Units total) by mouth 4 (four) times daily. 12/16/22   Erenest Blank, NP  ondansetron (ZOFRAN) 8 MG tablet Take 1 tablet (8 mg total) by mouth every 8 (eight) hours as needed for nausea or vomiting. Start on the third day after  cisplatin. 12/01/22   Josph Macho, MD  Oxycodone HCl 10 MG TABS Take 0.5-1 tablets (5-10 mg total) by mouth every 6 (six) hours as needed. 12/01/22   Josph Macho, MD  polyethylene glycol (MIRALAX / GLYCOLAX) 17 g packet Take 17 g by mouth daily as needed for mild constipation. 11/23/22   Kathlen Mody, MD  prochlorperazine (COMPAZINE) 10 MG tablet Take 1 tablet (10 mg total) by mouth every 6 (six) hours as needed for nausea or vomiting. 12/01/22   Josph Macho, MD      Allergies    Patient has no known allergies.    Review of Systems   Review of Systems  Respiratory:  Positive  for shortness of breath.     Physical Exam Updated Vital Signs BP 100/69   Pulse (!) 52   Temp 97.7 F (36.5 C) (Oral)   Resp 20   Ht 5\' 10"  (1.778 m)   Wt 92 kg   SpO2 100%   BMI 29.10 kg/m  Physical Exam Vitals and nursing note reviewed.  Constitutional:      General: He is not in acute distress. HENT:     Head: Normocephalic and atraumatic.     Nose: Nose normal.     Mouth/Throat:     Mouth: Mucous membranes are dry.  Eyes:     General: No scleral icterus. Cardiovascular:     Rate and Rhythm: Normal rate and regular rhythm.     Pulses: Normal pulses.     Heart sounds: Normal heart sounds.  Pulmonary:     Effort: Pulmonary effort is normal. No respiratory distress.     Breath sounds: No wheezing.     Comments: Lungs are clear to auscultation no crackles Abdominal:     Palpations: Abdomen is soft.     Tenderness: There is abdominal tenderness.     Comments: Mild diffuse abdominal tenderness.  No guarding or rebound  Musculoskeletal:     Cervical back: Normal range of motion.     Right lower leg: No edema.     Left lower leg: No edema.  Skin:    General: Skin is warm and dry.     Capillary Refill: Capillary refill takes less than 2 seconds.  Neurological:     Mental Status: He is alert. Mental status is at baseline.  Psychiatric:        Mood and Affect: Mood normal.        Behavior: Behavior normal.     ED Results / Procedures / Treatments   Labs (all labs ordered are listed, but only abnormal results are displayed) Labs Reviewed  BASIC METABOLIC PANEL - Abnormal; Notable for the following components:      Result Value   Potassium 5.5 (*)    CO2 16 (*)    Glucose, Bld 126 (*)    BUN 27 (*)    Creatinine, Ser 1.28 (*)    Calcium 10.5 (*)    Anion gap 20 (*)    All other components within normal limits  CBC - Abnormal; Notable for the following components:   WBC 19.6 (*)    All other components within normal limits  HEPATIC FUNCTION PANEL -  Abnormal; Notable for the following components:   Albumin 3.0 (*)    AST 109 (*)    ALT 77 (*)    Alkaline Phosphatase 415 (*)    All other components within normal limits  URINALYSIS, ROUTINE W REFLEX MICROSCOPIC - Abnormal; Notable  for the following components:   Protein, ur 30 (*)    All other components within normal limits  LACTIC ACID, PLASMA - Abnormal; Notable for the following components:   Lactic Acid, Venous 3.0 (*)    All other components within normal limits  LACTIC ACID, PLASMA - Abnormal; Notable for the following components:   Lactic Acid, Venous 2.9 (*)    All other components within normal limits  SALICYLATE LEVEL - Abnormal; Notable for the following components:   Salicylate Lvl <7.0 (*)    All other components within normal limits  BASIC METABOLIC PANEL - Abnormal; Notable for the following components:   Sodium 134 (*)    Potassium 5.2 (*)    CO2 21 (*)    Glucose, Bld 123 (*)    BUN 29 (*)    Calcium 10.4 (*)    All other components within normal limits  BASIC METABOLIC PANEL - Abnormal; Notable for the following components:   Potassium 5.5 (*)    Glucose, Bld 122 (*)    BUN 27 (*)    Creatinine, Ser 1.27 (*)    All other components within normal limits  I-STAT VENOUS BLOOD GAS, ED - Abnormal; Notable for the following components:   pH, Ven 7.433 (*)    pCO2, Ven 36.6 (*)    Potassium 5.2 (*)    All other components within normal limits  TROPONIN I (HIGH SENSITIVITY)  TROPONIN I (HIGH SENSITIVITY)    EKG None  Radiology CT Angio Chest PE W/Cm &/Or Wo Cm  Result Date: 12/17/2022 CLINICAL DATA:  Sharp chest pain and shortness of breath. History of liver cancer. PE suspected EXAM: CT ANGIOGRAPHY CHEST WITH CONTRAST TECHNIQUE: Multidetector CT imaging of the chest was performed using the standard protocol during bolus administration of intravenous contrast. Multiplanar CT image reconstructions and MIPs were obtained to evaluate the vascular anatomy.  RADIATION DOSE REDUCTION: This exam was performed according to the departmental dose-optimization program which includes automated exposure control, adjustment of the mA and/or kV according to patient size and/or use of iterative reconstruction technique. CONTRAST:  75mL OMNIPAQUE IOHEXOL 350 MG/ML SOLN COMPARISON:  Chest radiograph 12/17/2022 and CT chest 11/16/2022 FINDINGS: Cardiovascular: Satisfactory opacification of the pulmonary arteries to the segmental level. No acute pulmonary embolism. Mild cardiomegaly. No pericardial effusion. Accessed right chest wall Port-A-Cath tip at the superior cavoatrial junction. Mediastinum/Nodes: Unremarkable esophagus. New subcarinal lymphadenopathy measuring 2.1 cm (5/57) and new 9 mm right hilar lymph node (5/63). Lungs/Pleura: Unchanged size of the 7 mm subpleural nodule in the right middle lobe (6/87). 6 mm nodule in the medial right upper lobe (5/38), increased from 11/16/2022 when it measured 4 mm. Bibasilar atelectasis/scarring. Upper Abdomen: Multiple hypoattenuating liver masses. See report from same day CT of the abdomen and pelvis for further details. Musculoskeletal: No acute fracture or destructive osseous lesion Review of the MIP images confirms the above findings. IMPRESSION: 1. No evidence of pulmonary embolism. 2. New subcarinal and right hilar lymphadenopathy concerning for metastatic disease. 3. Increased size of a 6 mm nodule in the medial right upper lobe, previously 4 mm. Unchanged size of the 7 mm subpleural nodule in the right middle lobe. Also concerning for metastatic disease. 4. Multiple hypoattenuating liver masses. See separate report from same day CT abdomen pelvis for further details. Electronically Signed   By: Minerva Fester M.D.   On: 12/17/2022 23:44   CT ABDOMEN PELVIS WO CONTRAST  Result Date: 12/17/2022 CLINICAL DATA:  Abdominal pain, acute, nonlocalized  EXAM: CT ABDOMEN AND PELVIS WITHOUT CONTRAST TECHNIQUE: Multidetector CT imaging  of the abdomen and pelvis was performed following the standard protocol without IV contrast. RADIATION DOSE REDUCTION: This exam was performed according to the departmental dose-optimization program which includes automated exposure control, adjustment of the mA and/or kV according to patient size and/or use of iterative reconstruction technique. COMPARISON:  CT angio chest 12/17/2022, CT abdomen pelvis 12/13/2022 FINDINGS: Lower chest: Partially visualized central venous catheter terminating within the right atrium. Right lower lobe atelectasis. Right base pulmonary nodule measuring 9 x 8 mm. Please see separately dictated CT angiography chest 12/17/2022. Hepatobiliary: Enlarged liver measuring up to 26 cm. Stable multiple hepatic lesions that are heterogeneous and centrally hypodense. The largest measures 9.9 x 9.4 cm. No gallstones, gallbladder wall thickening, or pericholecystic fluid. No biliary dilatation. Pancreas: No focal lesion. Normal pancreatic contour. No surrounding inflammatory changes. No main pancreatic ductal dilatation. Spleen: Normal in size without focal abnormality. Adrenals/Urinary Tract: No adrenal nodule bilaterally. No nephrolithiasis and no hydronephrosis. Excretion of previously administered intravenous contrast noted from bilateral kidneys. No ureterolithiasis or hydroureter. The urinary bladder is unremarkable. Stomach/Bowel: Sigmoid resection. Stomach is within normal limits. No evidence of bowel wall thickening or dilatation. Stool throughout the colon. Appendix appears normal. Vascular/Lymphatic: No abdominal aorta or iliac aneurysm. Mild atherosclerotic plaque of the aorta and its branches. No abdominal, pelvic, or inguinal lymphadenopathy. Reproductive: Prostate is unremarkable. Other: No intraperitoneal free fluid. No intraperitoneal free gas. No organized fluid collection. Musculoskeletal: No abdominal wall hernia or abnormality. Healed anterior abdominal incision. No suspicious  lytic or blastic osseous lesions. No acute displaced fracture. IMPRESSION: 1. Stable multiple hepatic lesions consistent with known metastases. 2. Stool throughout the colon-correlate for constipation. 3. Right base pulmonary nodule measuring 9 x 8 mm. Please see separately dictated CT angiography chest 12/17/2022. 4. Sigmoid resection Electronically Signed   By: Tish Frederickson M.D.   On: 12/17/2022 23:44   DG Chest 1 View  Result Date: 12/17/2022 CLINICAL DATA:  [id number] Chest pain [id number] EXAM: CHEST  1 VIEW COMPARISON:  CT chest 11/16/2022. chest x-ray 03/09/2016 FINDINGS: Accessed right chest wall Port-A-Cath with tip overlying the expected region of the superior cavoatrial junction. The heart and mediastinal contours are unchanged. Bibasilar atelectasis. No focal consolidation. No pulmonary edema. No pleural effusion. No pneumothorax. No acute osseous abnormality. IMPRESSION: No active disease. Electronically Signed   By: Tish Frederickson M.D.   On: 12/17/2022 22:16    Procedures .Critical Care  Performed by: Gailen Shelter, PA Authorized by: Gailen Shelter, PA   Critical care provider statement:    Critical care time (minutes):  35   Critical care time was exclusive of:  Separately billable procedures and treating other patients and teaching time   Critical care was necessary to treat or prevent imminent or life-threatening deterioration of the following conditions:  Metabolic crisis (hyperkalemia)   Critical care was time spent personally by me on the following activities:  Development of treatment plan with patient or surrogate, review of old charts, re-evaluation of patient's condition, pulse oximetry, ordering and review of radiographic studies, ordering and review of laboratory studies, ordering and performing treatments and interventions, obtaining history from patient or surrogate, examination of patient and evaluation of patient's response to treatment   Care discussed with: admitting  provider       Medications Ordered in ED Medications  iohexol (OMNIPAQUE) 350 MG/ML injection 75 mL (75 mLs Intravenous Contrast Given 12/17/22 2326)  sodium chloride 0.9 %  bolus 1,000 mL (0 mLs Intravenous Stopped 12/18/22 0135)  sodium chloride 0.9 % bolus 1,000 mL (0 mLs Intravenous Stopped 12/18/22 0135)  alum & mag hydroxide-simeth (MAALOX/MYLANTA) 200-200-20 MG/5ML suspension 30 mL (30 mLs Oral Given 12/18/22 0229)    And  lidocaine (XYLOCAINE) 2 % viscous mouth solution 15 mL (15 mLs Oral Given 12/18/22 0229)  famotidine (PEPCID) IVPB 20 mg premix (0 mg Intravenous Stopped 12/18/22 0256)  ondansetron (ZOFRAN) injection 4 mg (4 mg Intravenous Given 12/18/22 0435)  sodium zirconium cyclosilicate (LOKELMA) packet 10 g (10 g Oral Given 12/18/22 0436)  sodium chloride 0.9 % bolus 1,000 mL (1,000 mLs Intravenous New Bag/Given 12/18/22 0440)    ED Course/ Medical Decision Making/ A&P Clinical Course as of 12/18/22 0457  Thu Dec 17, 2022  2348 Neg for PE -- does show enlargement of pulm nodules.  IMPRESSION: 1. No evidence of pulmonary embolism. 2. New subcarinal and right hilar lymphadenopathy concerning for metastatic disease. 3. Increased size of a 6 mm nodule in the medial right upper lobe, previously 4 mm. Unchanged size of the 7 mm subpleural nodule in the right middle lobe. Also concerning for metastatic disease. 4. Multiple hypoattenuating liver masses. See separate report from same day CT abdomen pelvis for further details.   Electronically Signed   By: Minerva Fester M.D.   On: 12/17/2022 23:44   [WF]  2349 NAD -- stable liver masses 1. Stable multiple hepatic lesions consistent with known metastases. 2. Stool throughout the colon-correlate for constipation. 3. Right base pulmonary nodule measuring 9 x 8 mm. Please see separately dictated CT angiography chest 12/17/2022. 4. Sigmoid resection [WF]  Fri Dec 18, 2022  1610 Discussed with pharmacy - Fayrene Fearing. Lokelma reasonably  safe w constipation.  [WF]    Clinical Course User Index [WF] Gailen Shelter, [place]                             Medical Decision Making Amount and/or Complexity of Data Reviewed Labs: ordered. Radiology: ordered.  Risk OTC drugs. Prescription drug management. Decision regarding hospitalization.   This patient presents to the ED for concern of CP, this involves a number of treatment options, and is a complaint that carries with it a moderate to high risk of complications and morbidity. A differential diagnosis was considered for the patient's symptoms which is discussed below:   The emergent causes of chest pain include: Acute coronary syndrome, tamponade, pericarditis/myocarditis, aortic dissection, pulmonary embolism, tension pneumothorax, pneumonia, and esophageal rupture.   Co morbidities: Discussed in HPI   Brief History:  Patient is a 47 year old male with a past medical history significant for metastatic colon cancer with mets to liver and possibly some mets to lungs (pulmonary nodules x2).   Patient presents emergency room today for complaints of sharp left-sided chest pain and shortness of breath that started around 9:30 PM.  He states that his symptoms persisted for approximately 2 hours but seem to have mostly resolved at this time.  Denies any nausea or vomiting.  He was seen 4/13 by me in the emergency room for constipation he states he has had bowel movements daily since that time.  No fevers or chills, no urinary frequency urgency dysuria or hematuria.  States all symptoms are resolved currently and he feels well.    EMR reviewed including pt PMHx, past surgical history and past visits to ER.   See HPI for more details   Lab Tests:  I ordered and independently interpreted labs. Labs notable for  BMP w K 5.5, AG of 20 Repeat BMP 5.2 K and normal AG>  Lactic acid is elevated, improves with IV hydration has received 2 L normal saline with some improvement.   Patient remains tachycardic when standing up but has normal low heart rate when he is laying down.  BMP with hyperkalemia 5.5 did not improve with 2 L of normal saline troponin normal with no delta, LFTs stable, CBC with uptrending leukocytosis.  Imaging Studies:  Abnormal findings. I personally reviewed all imaging studies. Imaging notable for  Some enlargement of the pulmonary nodules on CT scan concerning for enlargement of tumors, CT abdomen pelvis without contrast unremarkable  Cardiac Monitoring:  The patient was maintained on a cardiac monitor.  I personally viewed and interpreted the cardiac monitored which showed an underlying rhythm of: NSR intermittently tachycardic when sitting up or standing EKG non-ischemic   Medicines ordered:  I ordered medication including Lokelma, Zofran, Pepcid, GI cocktail, normal saline 2 L for dehydration Reevaluation of the patient after these medicines showed that the patient improved I have reviewed the patients home medicines and have made adjustments as needed   Critical Interventions:     Consults/Attending Physician   I discussed this case with my attending physician who cosigned this note including patient's presenting symptoms, physical exam, and planned diagnostics and interventions. Attending physician stated agreement with plan or made changes to plan which were implemented.   Reevaluation:  After the interventions noted above I re-evaluated patient and found that they have :stayed the same   Social Determinants of Health:      Problem List / ED Course:  Hyperkalemia in the setting of significant dehydration continuing to hydrate with normal saline will avoid LR.  Patient is not on ACE or ARB or spironolactone Dehydration -receiving fluids IV. Vomiting -improved after Reglan.  Continue to monitor   Dispostion:  After consideration of the diagnostic results and the patients response to treatment, I feel that the  patent would benefit from admission  Final Clinical Impression(s) / ED Diagnoses Final diagnoses:  Hyperkalemia  Weakness  Dehydration    Rx / DC Orders ED Discharge Orders     None         Gailen Shelter, [place] 12/18/22 1610    Zadie Rhine, MD 12/18/22 [phone]

## 2022-12-17 NOTE — ED Provider Triage Note (Addendum)
Emergency Medicine Provider Triage Evaluation Note  Richard Bowman , a 47 y.o. male  was evaluated in triage.  Pt complains of chest pain.  Has stage IV liver cancer.  Received last dose of chemo now.  Chest pain started 40 minutes ago.  Located in the left chest that is nonradiating feels sharp.  Also endorsing shortness of breath.  It is exertional.  Denies fever cough congestion.  Review of Systems  Positive: See above Negative: See above  Physical Exam  There were no vitals taken for this visit. Gen:   Awake, no distress   Resp:  Normal effort  MSK:   Moves extremities without difficulty  Other:    Medical Decision Making  Medically screening exam initiated at 9:37 PM.  Appropriate orders placed.  OSA BARTNIK was informed that the remainder of the evaluation will be completed by another provider, this initial triage assessment does not replace that evaluation, and the importance of remaining in the ED until their evaluation is complete.  Work up started   Gareth Eagle, PA-C 12/17/22 2138    Gareth Eagle, PA-C 12/17/22 2139

## 2022-12-17 NOTE — ED Triage Notes (Signed)
Pt c/o centralized sharp chest pain and shortness of breath that started approx 40 minutes ago. Hx of liver cancer.

## 2022-12-17 NOTE — Progress Notes (Signed)
[facility] CSW Progress Note  Clinical Child psychotherapist contacted patient by phone to assess needs.  Patient wanted to verify CSW had his sister's address where he is staying temporarily.  Per his request, mailed [phone] and [address] and Wellness information.  His sister's address is [address], [place], [place] [address].    Pascal Lux Ethne Jeon, LCSW

## 2022-12-17 NOTE — ED Notes (Signed)
Pt returned from ct

## 2022-12-17 NOTE — ED Provider Notes (Incomplete)
EMERGENCY DEPARTMENT AT [facility] Provider Note   CSN: [id number] Arrival date & time: 12/17/22  2129     History {Add pertinent medical, surgical, social history, OB history to HPI:1} Chief Complaint  Patient presents with  . Shortness of Breath    Richard Bowman is a 47 y.o. male.   Shortness of Breath  Patient is a 47 year old male with a past medical history significant for metastatic colon cancer with mets to liver and possibly some mets to lungs (pulmonary nodules x2).   Patient presents emergency room today for complaints of sharp left-sided chest pain and shortness of breath that started around 9:30 PM.  He states that his symptoms persisted for approximately 2 hours but seem to have mostly resolved at this time.       Home Medications Prior to Admission medications   Medication Sig Start Date End Date Taking? Authorizing Provider  acetaminophen (TYLENOL) 500 MG tablet Take 2 tablets (1,000 mg total) by mouth every 6 (six) hours as needed for mild pain. 11/22/22   Meuth, Brooke A, PA-C  dexamethasone (DECADRON) 4 MG tablet Take 2 tablets (8 mg total) by mouth daily for 3 days, starting the day after chemotherapy. Take with food. 12/01/22   Josph Macho, MD  docusate sodium (COLACE) 100 MG capsule Take 1 capsule (100 mg total) by mouth 2 (two) times daily. 11/23/22   Kathlen Mody, MD  feeding supplement (ENSURE ENLIVE / ENSURE PLUS) LIQD Take 237 mLs by mouth 2 (two) times daily between meals. Patient not taking: Reported on 12/01/2022 11/23/22   Kathlen Mody, MD  fluconazole (DIFLUCAN) 100 MG tablet Take 1 tablet (100 mg total) by mouth daily. 12/16/22   Erenest Blank, NP  hydrocortisone (ANUSOL-HC) 2.5 % rectal cream Place rectally 3 (three) times daily as needed for hemorrhoids or anal itching. 11/23/22   Kathlen Mody, MD  ibuprofen (ADVIL) 200 MG tablet Take 3 tablets (600 mg total) by mouth every 6 (six) hours as needed for moderate pain.  11/22/22   Meuth, Lina Sar, PA-C  lactulose (CHRONULAC) 10 GM/15ML solution Take 15 mLs (10 g total) by mouth 3 (three) times daily. 12/04/22   Josph Macho, MD  lidocaine-prilocaine (EMLA) cream Apply 1 Application topically as needed. 12/16/22   Erenest Blank, NP  loperamide (IMODIUM A-D) 2 MG tablet Take 2 tabs by mouth with first loose stool, then 1 tablet with each additional loose stool as needed. Do not exceed 8 tablets in a 24-hour period. 12/01/22   Josph Macho, MD  methocarbamol (ROBAXIN) 500 MG tablet Take 1 tablet (500 mg total) by mouth every 6 (six) hours as needed for muscle spasms. 11/23/22   Kathlen Mody, MD  nicotine (NICODERM CQ - DOSED IN MG/24 HOURS) 21 mg/24hr patch Place 1 patch (21 mg total) onto the skin daily. Patient not taking: Reported on 12/01/2022 11/23/22 11/23/23  Kathlen Mody, MD  nystatin (MYCOSTATIN) 100000 UNIT/ML suspension Take 5 mLs (500,000 Units total) by mouth 4 (four) times daily. 12/16/22   Erenest Blank, NP  ondansetron (ZOFRAN) 8 MG tablet Take 1 tablet (8 mg total) by mouth every 8 (eight) hours as needed for nausea or vomiting. Start on the third day after cisplatin. 12/01/22   Josph Macho, MD  Oxycodone HCl 10 MG TABS Take 0.5-1 tablets (5-10 mg total) by mouth every 6 (six) hours as needed. 12/01/22   Josph Macho, MD  polyethylene glycol (MIRALAX / Ethelene Hal)  17 g packet Take 17 g by mouth daily as needed for mild constipation. 11/23/22   Kathlen Mody, MD  prochlorperazine (COMPAZINE) 10 MG tablet Take 1 tablet (10 mg total) by mouth every 6 (six) hours as needed for nausea or vomiting. 12/01/22   Josph Macho, MD      Allergies    Patient has no known allergies.    Review of Systems   Review of Systems  Respiratory:  Positive for shortness of breath.     Physical Exam Updated Vital Signs BP 135/82 (BP Location: Right Arm)   Pulse (!) 54   Temp (!) 97.5 F (36.4 C)   Resp 16   Ht  (1.778 m)   Wt 92 kg   SpO2 96%   BMI  29.10 kg/m  Physical Exam  ED Results / Procedures / Treatments   Labs (all labs ordered are listed, but only abnormal results are displayed) Labs Reviewed  BASIC METABOLIC PANEL - Abnormal; Notable for the following components:      Result Value   Potassium 5.5 (*)    CO2 16 (*)    Glucose, Bld 126 (*)    BUN 27 (*)    Creatinine, Ser 1.28 (*)    Calcium 10.5 (*)    Anion gap 20 (*)    All other components within normal limits  CBC - Abnormal; Notable for the following components:   WBC 19.6 (*)    All other components within normal limits  URINALYSIS, ROUTINE W REFLEX MICROSCOPIC - Abnormal; Notable for the following components:   Protein, ur 30 (*)    All other components within normal limits  HEPATIC FUNCTION PANEL  LACTIC ACID, PLASMA  LACTIC ACID, PLASMA  SALICYLATE LEVEL  BASIC METABOLIC PANEL  I-STAT VENOUS BLOOD GAS, ED  TROPONIN I (HIGH SENSITIVITY)  TROPONIN I (HIGH SENSITIVITY)    EKG None  Radiology DG Chest 1 View  Result Date: 12/17/2022 CLINICAL DATA:  [id number] Chest pain [id number] EXAM: CHEST  1 VIEW COMPARISON:  CT chest 11/16/2022. chest x-ray 03/09/2016 FINDINGS: Accessed right chest wall Port-A-Cath with tip overlying the expected region of the superior cavoatrial junction. The heart and mediastinal contours are unchanged. Bibasilar atelectasis. No focal consolidation. No pulmonary edema. No pleural effusion. No pneumothorax. No acute osseous abnormality. IMPRESSION: No active disease. Electronically Signed   By: Tish Frederickson M.D.   On: 12/17/2022 22:16    Procedures Procedures  {Document cardiac monitor, telemetry assessment procedure when appropriate:1}  Medications Ordered in ED Medications  sodium chloride 0.9 % bolus 1,000 mL (has no administration in time range)  iohexol (OMNIPAQUE) 350 MG/ML injection 75 mL (75 mLs Intravenous Contrast Given 12/17/22 2326)    ED Course/ Medical Decision Making/ A&P   {   Click here for ABCD2, HEART and  other calculatorsREFRESH Note before signing :1}                          Medical Decision Making Amount and/or Complexity of Data Reviewed Labs: ordered. Radiology: ordered.   ***  {Document critical care time when appropriate:1} {Document review of labs and clinical decision tools ie heart score, Chads2Vasc2 etc:1}  {Document your independent review of radiology images, and any outside records:1} {Document your discussion with family members, caretakers, and with consultants:1} {Document social determinants of health affecting pt's care:1} {Document your decision making why or why not admission, treatments were needed:1} Final Clinical Impression(s) / ED Diagnoses  Final diagnoses:  None    Rx / DC Orders ED Discharge Orders     None

## 2022-12-18 ENCOUNTER — Other Ambulatory Visit: Payer: Self-pay

## 2022-12-18 ENCOUNTER — Inpatient Hospital Stay: Payer: Medicaid Other

## 2022-12-18 ENCOUNTER — Observation Stay (HOSPITAL_COMMUNITY): Payer: Medicaid Other

## 2022-12-18 DIAGNOSIS — R008 Other abnormalities of heart beat: Secondary | ICD-10-CM | POA: Diagnosis not present

## 2022-12-18 DIAGNOSIS — E875 Hyperkalemia: Secondary | ICD-10-CM

## 2022-12-18 LAB — I-STAT VENOUS BLOOD GAS, ED
Acid-Base Excess: 0 mmol/L (ref 0.0–2.0)
Bicarbonate: 24.4 mmol/L (ref 20.0–28.0)
Calcium, Ion: 1.29 mmol/L (ref 1.15–1.40)
HCT: 39 % (ref 39.0–52.0)
Hemoglobin: 13.3 g/dL (ref 13.0–17.0)
O2 Saturation: 73 %
Potassium: 5.2 mmol/L — ABNORMAL HIGH (ref 3.5–5.1)
Sodium: 135 mmol/L (ref 135–145)
TCO2: 26 mmol/L (ref 22–32)
pCO2, Ven: 36.6 mmHg — ABNORMAL LOW (ref 44–60)
pH, Ven: 7.433 — ABNORMAL HIGH (ref 7.25–7.43)
pO2, Ven: 37 mmHg (ref 32–45)

## 2022-12-18 LAB — CBC
HCT: 35.2 % — ABNORMAL LOW (ref 39.0–52.0)
Hemoglobin: 11.6 g/dL — ABNORMAL LOW (ref 13.0–17.0)
MCH: 29 pg (ref 26.0–34.0)
MCHC: 33 g/dL (ref 30.0–36.0)
MCV: 88 fL (ref 80.0–100.0)
Platelets: 309 10*3/uL (ref 150–400)
RBC: 4 MIL/uL — ABNORMAL LOW (ref 4.22–5.81)
RDW: 12.7 % (ref 11.5–15.5)
WBC: 19.5 10*3/uL — ABNORMAL HIGH (ref 4.0–10.5)
nRBC: 0 % (ref 0.0–0.2)

## 2022-12-18 LAB — ECHOCARDIOGRAM COMPLETE
Area-P 1/2: 1.82 cm2
Height: 70 in
S' Lateral: 3.6 cm
Weight: 3245.17 oz

## 2022-12-18 LAB — LACTIC ACID, PLASMA
Lactic Acid, Venous: 2.5 mmol/L (ref 0.5–1.9)
Lactic Acid, Venous: 2.6 mmol/L (ref 0.5–1.9)
Lactic Acid, Venous: 2.9 mmol/L (ref 0.5–1.9)
Lactic Acid, Venous: 3 mmol/L (ref 0.5–1.9)

## 2022-12-18 LAB — BASIC METABOLIC PANEL
Anion gap: 10 (ref 5–15)
Anion gap: 11 (ref 5–15)
Anion gap: 13 (ref 5–15)
Anion gap: 14 (ref 5–15)
Anion gap: 14 (ref 5–15)
BUN: 24 mg/dL — ABNORMAL HIGH (ref 6–20)
BUN: 27 mg/dL — ABNORMAL HIGH (ref 6–20)
BUN: 27 mg/dL — ABNORMAL HIGH (ref 6–20)
BUN: 29 mg/dL — ABNORMAL HIGH (ref 6–20)
BUN: 29 mg/dL — ABNORMAL HIGH (ref 6–20)
CO2: 19 mmol/L — ABNORMAL LOW (ref 22–32)
CO2: 21 mmol/L — ABNORMAL LOW (ref 22–32)
CO2: 21 mmol/L — ABNORMAL LOW (ref 22–32)
CO2: 22 mmol/L (ref 22–32)
CO2: 23 mmol/L (ref 22–32)
Calcium: 10 mg/dL (ref 8.9–10.3)
Calcium: 10 mg/dL (ref 8.9–10.3)
Calcium: 10.1 mg/dL (ref 8.9–10.3)
Calcium: 10.4 mg/dL — ABNORMAL HIGH (ref 8.9–10.3)
Calcium: 9.5 mg/dL (ref 8.9–10.3)
Chloride: 103 mmol/L (ref 98–111)
Chloride: 103 mmol/L (ref 98–111)
Chloride: 104 mmol/L (ref 98–111)
Chloride: 106 mmol/L (ref 98–111)
Chloride: 99 mmol/L (ref 98–111)
Creatinine, Ser: 1.1 mg/dL (ref 0.61–1.24)
Creatinine, Ser: 1.11 mg/dL (ref 0.61–1.24)
Creatinine, Ser: 1.22 mg/dL (ref 0.61–1.24)
Creatinine, Ser: 1.27 mg/dL — ABNORMAL HIGH (ref 0.61–1.24)
Creatinine, Ser: 1.35 mg/dL — ABNORMAL HIGH (ref 0.61–1.24)
GFR, Estimated: >60 mL/min (ref >60)
GFR, Estimated: >60 mL/min (ref >60)
GFR, Estimated: >60 mL/min (ref >60)
GFR, Estimated: >60 mL/min (ref >60)
GFR, Estimated: >60 mL/min (ref >60)
Glucose, Bld: 106 mg/dL — ABNORMAL HIGH (ref 70–99)
Glucose, Bld: 111 mg/dL — ABNORMAL HIGH (ref 70–99)
Glucose, Bld: 113 mg/dL — ABNORMAL HIGH (ref 70–99)
Glucose, Bld: 122 mg/dL — ABNORMAL HIGH (ref 70–99)
Glucose, Bld: 123 mg/dL — ABNORMAL HIGH (ref 70–99)
Potassium: 4 mmol/L (ref 3.5–5.1)
Potassium: 4.9 mmol/L (ref 3.5–5.1)
Potassium: 5 mmol/L (ref 3.5–5.1)
Potassium: 5.2 mmol/L — ABNORMAL HIGH (ref 3.5–5.1)
Potassium: 5.5 mmol/L — ABNORMAL HIGH (ref 3.5–5.1)
Sodium: 134 mmol/L — ABNORMAL LOW (ref 135–145)
Sodium: 135 mmol/L (ref 135–145)
Sodium: 137 mmol/L (ref 135–145)
Sodium: 137 mmol/L (ref 135–145)
Sodium: 140 mmol/L (ref 135–145)

## 2022-12-18 LAB — HEPATIC FUNCTION PANEL
ALT: 77 U/L — ABNORMAL HIGH (ref 0–44)
AST: 109 U/L — ABNORMAL HIGH (ref 15–41)
Albumin: 3 g/dL — ABNORMAL LOW (ref 3.5–5.0)
Alkaline Phosphatase: 415 U/L — ABNORMAL HIGH (ref 38–126)
Bilirubin, Direct: 0.2 mg/dL (ref 0.0–0.2)
Indirect Bilirubin: 0.3 mg/dL (ref 0.3–0.9)
Total Bilirubin: 0.5 mg/dL (ref 0.3–1.2)
Total Protein: 7 g/dL (ref 6.5–8.1)

## 2022-12-18 LAB — TROPONIN I (HIGH SENSITIVITY)
Troponin I (High Sensitivity): 12 ng/L (ref <18)
Troponin I (High Sensitivity): 15 ng/L (ref <18)
Troponin I (High Sensitivity): 19 ng/L — ABNORMAL HIGH (ref <18)
Troponin I (High Sensitivity): 9 ng/L (ref <18)

## 2022-12-18 LAB — SEDIMENTATION RATE: Sed Rate: 40 mm/hr — ABNORMAL HIGH (ref 0–16)

## 2022-12-18 LAB — SALICYLATE LEVEL: Salicylate Lvl: <7 mg/dL — ABNORMAL LOW (ref 7.0–30.0)

## 2022-12-18 LAB — MAGNESIUM: Magnesium: 2.4 mg/dL (ref 1.7–2.4)

## 2022-12-18 LAB — C-REACTIVE PROTEIN: CRP: 2.1 mg/dL — ABNORMAL HIGH (ref <1.0)

## 2022-12-18 LAB — CK: Total CK: 139 U/L (ref 49–397)

## 2022-12-18 LAB — CORTISOL: Cortisol, Plasma: 2.2 ug/dL

## 2022-12-18 MED ORDER — OXYCODONE-ACETAMINOPHEN 5-325 MG PO TABS
2.0000 | ORAL_TABLET | Freq: Once | ORAL | Status: AC
  Administered 2022-12-18: 2 via ORAL
  Filled 2022-12-18: qty 2

## 2022-12-18 MED ORDER — INSULIN ASPART 100 UNIT/ML IV SOLN
10.0000 [IU] | Freq: Once | INTRAVENOUS | Status: AC
  Administered 2022-12-18: 10 [IU] via INTRAVENOUS

## 2022-12-18 MED ORDER — LIDOCAINE VISCOUS HCL 2 % MT SOLN
15.0000 mL | Freq: Once | OROMUCOSAL | Status: AC
  Administered 2022-12-18: 15 mL via ORAL
  Filled 2022-12-18: qty 15

## 2022-12-18 MED ORDER — ONDANSETRON HCL 4 MG/2ML IJ SOLN
4.0000 mg | Freq: Once | INTRAMUSCULAR | Status: AC
  Administered 2022-12-18: 4 mg via INTRAVENOUS
  Filled 2022-12-18: qty 2

## 2022-12-18 MED ORDER — ONDANSETRON HCL 4 MG/2ML IJ SOLN: 4.0000 mg | Freq: Once | INTRAMUSCULAR | Status: DC

## 2022-12-18 MED ORDER — OXYCODONE HCL 5 MG PO TABS
5.0000 mg | ORAL_TABLET | Freq: Four times a day (QID) | ORAL | Status: DC | PRN
  Administered 2022-12-18 – 2022-12-20 (×2): 10 mg via ORAL
  Filled 2022-12-18 (×2): qty 2

## 2022-12-18 MED ORDER — DEXTROSE 50 % IV SOLN
50.0000 mL | Freq: Once | INTRAVENOUS | Status: AC
  Administered 2022-12-18: 50 mL via INTRAVENOUS
  Filled 2022-12-18: qty 50

## 2022-12-18 MED ORDER — ALUM & MAG HYDROXIDE-SIMETH 200-200-20 MG/5ML PO SUSP
30.0000 mL | Freq: Four times a day (QID) | ORAL | Status: DC | PRN
  Administered 2022-12-18: 30 mL via ORAL
  Filled 2022-12-18: qty 30

## 2022-12-18 MED ORDER — SODIUM ZIRCONIUM CYCLOSILICATE 10 G PO PACK
10.0000 g | PACK | ORAL | Status: AC
  Administered 2022-12-18: 10 g via ORAL
  Filled 2022-12-18: qty 1

## 2022-12-18 MED ORDER — CALCIUM GLUCONATE-NACL 1-0.675 GM/50ML-% IV SOLN
1.0000 g | Freq: Once | INTRAVENOUS | Status: AC
  Administered 2022-12-18: 1000 mg via INTRAVENOUS
  Filled 2022-12-18: qty 50

## 2022-12-18 MED ORDER — ALUM & MAG HYDROXIDE-SIMETH 200-200-20 MG/5ML PO SUSP
30.0000 mL | Freq: Once | ORAL | Status: AC
  Administered 2022-12-18: 30 mL via ORAL
  Filled 2022-12-18: qty 30

## 2022-12-18 MED ORDER — COSYNTROPIN 0.25 MG IJ SOLR
0.2500 mg | Freq: Once | INTRAMUSCULAR | Status: AC
  Administered 2022-12-19: 0.25 mg via INTRAVENOUS
  Filled 2022-12-18: qty 0.25

## 2022-12-18 MED ORDER — SODIUM CHLORIDE 0.9 % IV BOLUS
1000.0000 mL | Freq: Once | INTRAVENOUS | Status: AC
  Administered 2022-12-18: 1000 mL via INTRAVENOUS

## 2022-12-18 MED ORDER — ENOXAPARIN SODIUM 40 MG/0.4ML IJ SOSY
40.0000 mg | PREFILLED_SYRINGE | Freq: Every day | INTRAMUSCULAR | Status: DC
  Administered 2022-12-18 – 2022-12-20 (×3): 40 mg via SUBCUTANEOUS
  Filled 2022-12-18 (×3): qty 0.4

## 2022-12-18 MED ORDER — NITROGLYCERIN 0.4 MG SL SUBL
0.4000 mg | SUBLINGUAL_TABLET | SUBLINGUAL | Status: AC | PRN
  Administered 2022-12-18 (×2): 0.4 mg via SUBLINGUAL

## 2022-12-18 MED ORDER — SODIUM CHLORIDE 0.9% FLUSH: 10.0000 mL | INTRAVENOUS | Status: DC | PRN

## 2022-12-18 MED ORDER — NITROGLYCERIN 0.4 MG SL SUBL
SUBLINGUAL_TABLET | SUBLINGUAL | Status: AC
  Administered 2022-12-18: 0.4 mg via SUBLINGUAL
  Filled 2022-12-18: qty 1

## 2022-12-18 MED ORDER — MORPHINE SULFATE (PF) 2 MG/ML IV SOLN
2.0000 mg | INTRAVENOUS | Status: DC | PRN
  Administered 2022-12-18 (×2): 2 mg via INTRAVENOUS
  Filled 2022-12-18 (×2): qty 1

## 2022-12-18 MED ORDER — ONDANSETRON HCL 4 MG/2ML IJ SOLN
4.0000 mg | Freq: Four times a day (QID) | INTRAMUSCULAR | Status: DC | PRN
  Administered 2022-12-18 – 2022-12-20 (×6): 4 mg via INTRAVENOUS
  Filled 2022-12-18 (×7): qty 2

## 2022-12-18 MED ORDER — ACETAMINOPHEN 650 MG RE SUPP: 650.0000 mg | Freq: Four times a day (QID) | RECTAL | Status: DC | PRN

## 2022-12-18 MED ORDER — FAMOTIDINE IN NACL 20-0.9 MG/50ML-% IV SOLN
20.0000 mg | Freq: Once | INTRAVENOUS | Status: AC
  Administered 2022-12-18: 20 mg via INTRAVENOUS
  Filled 2022-12-18: qty 50

## 2022-12-18 MED ORDER — PANTOPRAZOLE SODIUM 40 MG IV SOLR
40.0000 mg | Freq: Two times a day (BID) | INTRAVENOUS | Status: DC
  Administered 2022-12-18 – 2022-12-20 (×5): 40 mg via INTRAVENOUS
  Filled 2022-12-18 (×5): qty 10

## 2022-12-18 MED ORDER — CHLORPROMAZINE HCL 25 MG/ML IJ SOLN
25.0000 mg | Freq: Once | INTRAMUSCULAR | Status: AC
  Administered 2022-12-18: 25 mg via INTRAMUSCULAR
  Filled 2022-12-18: qty 1

## 2022-12-18 MED ORDER — CHLORHEXIDINE GLUCONATE CLOTH 2 % EX PADS
6.0000 | MEDICATED_PAD | Freq: Every day | CUTANEOUS | Status: DC
  Administered 2022-12-18 – 2022-12-20 (×3): 6 via TOPICAL

## 2022-12-18 MED ORDER — ACETAMINOPHEN 325 MG PO TABS
650.0000 mg | ORAL_TABLET | Freq: Four times a day (QID) | ORAL | Status: DC | PRN
  Administered 2022-12-18 (×2): 650 mg via ORAL
  Filled 2022-12-18 (×2): qty 2

## 2022-12-18 MED ORDER — SODIUM CHLORIDE 0.9 % IV SOLN: INTRAVENOUS | Status: DC

## 2022-12-18 NOTE — Progress Notes (Signed)
Pt complaining of chest pain 10/10. HR 140's. Dr. Virgel Manifold and RRT called. EKG done.

## 2022-12-18 NOTE — ED Notes (Signed)
Pt to vascular via stretcher

## 2022-12-18 NOTE — ED Notes (Signed)
ED TO INPATIENT HANDOFF REPORT  ED Nurse Name and Phone #: [phone]  S Name/Age/Gender Richard Bowman 47 y.o. male Room/Bed: 038C/038C  Code Status   Code Status: Full Code  Home/SNF/Other Home Patient oriented to: self, place, time, and situation Is this baseline? Yes   Triage Complete: Triage complete  Chief Complaint Hyperkalemia [E87.5]  Triage Note Pt c/o centralized sharp chest pain and shortness of breath that started approx 40 minutes ago. Hx of liver cancer.    Allergies No Known Allergies  Level of Care/Admitting Diagnosis ED Disposition     ED Disposition  Admit   Condition  --   Comment  Hospital Area: [facility] [[id number]]  Level of Care: Telemetry Medical [104]  May place patient in observation at [facility] or [facility] if equivalent level of care is available:: No  Covid Evaluation: Asymptomatic - no recent exposure (last 10 days) testing not required  Diagnosis: Hyperkalemia [621308]  Admitting Physician: Angie Fava [[id number]]  Attending Physician: Angie Fava [[id number]]          B Medical/Surgery History Past Medical History:  Diagnosis Date   Cancer    Esophageal ulcer 2007   Renal disorder    CKD 10/28/20 "doesn't have it now"   Past Surgical History:  Procedure Laterality Date   BIOPSY  11/17/2022   Procedure: BIOPSY;  Surgeon: Napoleon Form, MD;  Location: [facility] ENDOSCOPY;  Service: Gastroenterology;;   COLON RESECTION SIGMOID N/A 11/18/2022   Procedure: OPEN SIGMOID COLECTOMY;  Surgeon: Manus Rudd, MD;  Location: [facility] OR;  Service: General;  Laterality: N/A;   COLONOSCOPY WITH PROPOFOL N/A 11/17/2022   Procedure: COLONOSCOPY WITH PROPOFOL;  Surgeon: Napoleon Form, MD;  Location: [facility] ENDOSCOPY;  Service: Gastroenterology;  Laterality: N/A;   ESOPHAGOGASTRODUODENOSCOPY (EGD) WITH PROPOFOL N/A 11/17/2022   Procedure: ESOPHAGOGASTRODUODENOSCOPY (EGD) WITH PROPOFOL;  Surgeon: Napoleon Form, MD;  Location: [facility];  Service: Gastroenterology;  Laterality: N/A;   IR IMAGING GUIDED PORT INSERTION  12/14/2022   LIVER BIOPSY N/A 11/18/2022   Procedure: LIVER BIOPSY;  Surgeon: Manus Rudd, MD;  Location: [facility] OR;  Service: General;  Laterality: N/A;   SUBMUCOSAL TATTOO INJECTION  11/17/2022   Procedure: SUBMUCOSAL TATTOO INJECTION;  Surgeon: Napoleon Form, MD;  Location: [facility];  Service: Gastroenterology;;     A IV Location/Drains/Wounds Patient Lines/Drains/Airways Status     Active Line/Drains/Airways     Name Placement date Placement time Site Days   Implanted Port 12/14/22 Right Chest 12/14/22  1252  Chest  4   Peripheral IV 12/17/22 20 G Left Antecubital 12/17/22  2154  Antecubital  1   Peripheral IV 12/18/22 20 G 1" Left;Posterior Hand 12/18/22  0911  Hand  less than 1   Wound / Incision (Open or Dehisced) 12/14/22 Puncture Neck Right venous access for [facility] 12/14/22  1309  Neck  4            Intake/Output Last 24 hours  Intake/Output Summary (Last 24 hours) at 12/18/2022 1257 Last data filed at 12/18/2022 0808 Gross per 24 hour  Intake 3150 ml  Output --  Net 3150 ml    Labs/Imaging Results for orders placed or performed during the hospital encounter of 12/17/22 (from the past 48 hour(s))  Basic metabolic panel     Status: Abnormal   Collection Time: 12/17/22  9:50 PM  Result Value Ref Range   Sodium 136 135 - 145 mmol/L   Potassium 5.5 (  H) 3.5 - 5.1 mmol/L    Comment: HEMOLYSIS AT THIS LEVEL MAY AFFECT RESULT   Chloride 100 98 - 111 mmol/L   CO2 16 (L) 22 - 32 mmol/L   Glucose, Bld 126 (H) 70 - 99 mg/dL    Comment: Glucose reference range applies only to samples taken after fasting for at least 8 hours.   BUN 27 (H) 6 - 20 mg/dL   Creatinine, Ser 8.46 (H) 0.61 - 1.24 mg/dL   Calcium 96.2 (H) 8.9 - 10.3 mg/dL   GFR, Estimated >95 >28 mL/min    Comment: (NOTE) Calculated using the CKD-EPI Creatinine Equation (2021)    Anion gap 20  (H) 5 - 15    Comment: Performed at [facility], [address] [place], [place] [address]  CBC     Status: Abnormal   Collection Time: 12/17/22  9:50 PM  Result Value Ref Range   WBC 19.6 (H) 4.0 - 10.5 K/uL   RBC 5.04 4.22 - 5.81 MIL/uL   Hemoglobin 14.6 13.0 - 17.0 g/dL   HCT 40.1 02.7 - 25.3 %   MCV 94.6 80.0 - 100.0 fL   MCH 29.0 26.0 - 34.0 pg   MCHC 30.6 30.0 - 36.0 g/dL   RDW 66.4 40.3 - 47.4 %   Platelets 255 150 - 400 K/uL   nRBC 0.0 0.0 - 0.2 %    Comment: Performed at [facility], [address], [place], [place] [address]  Troponin I (High Sensitivity)     Status: None   Collection Time: 12/17/22  9:50 PM  Result Value Ref Range   Troponin I (High Sensitivity) 6 <18 ng/L    Comment: (NOTE) Elevated high sensitivity troponin I (hsTnI) values and significant  changes across serial measurements may suggest ACS but many other  chronic and acute conditions are known to elevate hsTnI results.  Refer to the "Links" section for chest pain algorithms and additional  guidance. Performed at [facility], [address], [place], [place] [address]   Urinalysis, Routine w reflex microscopic -Urine, Clean Catch     Status: Abnormal   Collection Time: 12/17/22 10:57 PM  Result Value Ref Range   Color, Urine YELLOW YELLOW   APPearance CLEAR CLEAR   Specific Gravity, Urine 1.016 1.005 - 1.030   pH 5.0 5.0 - 8.0   Glucose, UA NEGATIVE NEGATIVE mg/dL   Hgb urine dipstick NEGATIVE NEGATIVE   Bilirubin Urine NEGATIVE NEGATIVE   Ketones, ur NEGATIVE NEGATIVE mg/dL   Protein, ur 30 (A) NEGATIVE mg/dL   Nitrite NEGATIVE NEGATIVE   Leukocytes,Ua NEGATIVE NEGATIVE   RBC / HPF 0-5 0 - 5 RBC/hpf   WBC, UA 0-5 0 - 5 WBC/hpf   Bacteria, UA NONE SEEN NONE SEEN   Squamous Epithelial / HPF 0-5 0 - 5 /HPF   Mucus PRESENT     Comment: Performed at [facility], [address] [place], [place] [address]  Hepatic function panel     Status: Abnormal   Collection  Time: 12/17/22 11:50 PM  Result Value Ref Range   Total Protein 7.0 6.5 - 8.1 g/dL   Albumin 3.0 (L) 3.5 - 5.0 g/dL   AST 518 (H) 15 - 41 U/L   ALT 77 (H) 0 - 44 U/L   Alkaline Phosphatase 415 (H) 38 - 126 U/L   Total Bilirubin 0.5 0.3 - 1.2 mg/dL   Bilirubin, Direct 0.2 0.0 - 0.2 mg/dL  Indirect Bilirubin 0.3 0.3 - 0.9 mg/dL    Comment: Performed at [facility], [address], [place], [place] [address]  Troponin I (High Sensitivity)     Status: None   Collection Time: 12/17/22 11:50 PM  Result Value Ref Range   Troponin I (High Sensitivity) 9 <18 ng/L    Comment: (NOTE) Elevated high sensitivity troponin I (hsTnI) values and significant  changes across serial measurements may suggest ACS but many other  chronic and acute conditions are known to elevate hsTnI results.  Refer to the "Links" section for chest pain algorithms and additional  guidance. Performed at [facility], [address], [place], [place] [address]   Lactic acid, plasma     Status: Abnormal   Collection Time: 12/17/22 11:50 PM  Result Value Ref Range   Lactic Acid, Venous 3.0 (HH) 0.5 - 1.9 mmol/L    Comment: CRITICAL RESULT CALLED TO, READ BACK BY AND VERIFIED WITH R. Allaire,RN. 0040 12/18/22. LPAIT Performed at [facility], [address], [place], [place] [address]   Salicylate level     Status: Abnormal   Collection Time: 12/17/22 11:50 PM  Result Value Ref Range   Salicylate Lvl <7.0 (L) 7.0 - 30.0 mg/dL    Comment: Performed at [facility], [address], [place], [place] [address]  Basic metabolic panel     Status: Abnormal   Collection Time: 12/17/22 11:50 PM  Result Value Ref Range   Sodium 134 (L) 135 - 145 mmol/L   Potassium 5.2 (H) 3.5 - 5.1 mmol/L   Chloride 99 98 - 111 mmol/L   CO2 21 (L) 22 - 32 mmol/L   Glucose, Bld 123 (H) 70 - 99 mg/dL    Comment: Glucose reference range applies only to samples taken after fasting for at least 8 hours.   BUN 29 (H) 6 - 20  mg/dL   Creatinine, Ser 2.95 0.61 - 1.24 mg/dL   Calcium 62.1 (H) 8.9 - 10.3 mg/dL   GFR, Estimated >30 >86 mL/min    Comment: (NOTE) Calculated using the CKD-EPI Creatinine Equation (2021)    Anion gap 14 5 - 15    Comment: Performed at [facility], [address], [place], [place] [address]  I-Stat venous blood gas, [facility], [facility], [facility])     Status: Abnormal   Collection Time: 12/17/22 11:55 PM  Result Value Ref Range   pH, Ven 7.433 (H) 7.25 - 7.43   pCO2, Ven 36.6 (L) 44 - 60 mmHg   pO2, Ven 37 32 - 45 mmHg   Bicarbonate 24.4 20.0 - 28.0 mmol/L   TCO2 26 22 - 32 mmol/L   O2 Saturation 73 %   Acid-Base Excess 0.0 0.0 - 2.0 mmol/L   Sodium 135 135 - 145 mmol/L   Potassium 5.2 (H) 3.5 - 5.1 mmol/L   Calcium, Ion 1.29 1.15 - 1.40 mmol/L   HCT 39.0 39.0 - 52.0 %   Hemoglobin 13.3 13.0 - 17.0 g/dL   Sample type VENOUS    Comment NOTIFIED PHYSICIAN   Lactic acid, plasma     Status: Abnormal   Collection Time: 12/18/22  1:46 AM  Result Value Ref Range   Lactic Acid, Venous 2.9 (HH) 0.5 - 1.9 mmol/L    Comment: CRITICAL VALUE NOTED. VALUE IS CONSISTENT WITH PREVIOUSLY REPORTED/CALLED VALUE Performed at [facility], [address] [place], [place] [address]   Basic metabolic panel  Status: Abnormal   Collection Time: 12/18/22  2:30 AM  Result Value Ref Range   Sodium 137 135 - 145 mmol/L   Potassium 5.5 (H) 3.5 - 5.1 mmol/L   Chloride 103 98 - 111 mmol/L   CO2 23 22 - 32 mmol/L   Glucose, Bld 122 (H) 70 - 99 mg/dL    Comment: Glucose reference range applies only to samples taken after fasting for at least 8 hours.   BUN 27 (H) 6 - 20 mg/dL   Creatinine, Ser 1.61 (H) 0.61 - 1.24 mg/dL   Calcium 09.6 8.9 - 04.5 mg/dL   GFR, Estimated >40 >98 mL/min    Comment: (NOTE) Calculated using the CKD-EPI Creatinine Equation (2021)    Anion gap 11 5 - 15    Comment: Performed at [facility], [address], [place], [place] [address]  Basic metabolic panel      Status: Abnormal   Collection Time: 12/18/22  6:15 AM  Result Value Ref Range   Sodium 135 135 - 145 mmol/L   Potassium 5.0 3.5 - 5.1 mmol/L   Chloride 103 98 - 111 mmol/L   CO2 19 (L) 22 - 32 mmol/L   Glucose, Bld 111 (H) 70 - 99 mg/dL    Comment: Glucose reference range applies only to samples taken after fasting for at least 8 hours.   BUN 27 (H) 6 - 20 mg/dL   Creatinine, Ser 7.82 0.61 - 1.24 mg/dL   Calcium 9.5 8.9 - 95.6 mg/dL   GFR, Estimated >21 >30 mL/min    Comment: (NOTE) Calculated using the CKD-EPI Creatinine Equation (2021)    Anion gap 13 5 - 15    Comment: Performed at [facility], [address] [place], [place] [address]  CBC     Status: Abnormal   Collection Time: 12/18/22  6:15 AM  Result Value Ref Range   WBC 19.5 (H) 4.0 - 10.5 K/uL   RBC 4.00 (L) 4.22 - 5.81 MIL/uL   Hemoglobin 11.6 (L) 13.0 - 17.0 g/dL   HCT 46.9 (L) 62.9 - 52.8 %   MCV 88.0 80.0 - 100.0 fL   MCH 29.0 26.0 - 34.0 pg   MCHC 33.0 30.0 - 36.0 g/dL   RDW 41.3 24.4 - 01.0 %   Platelets 309 150 - 400 K/uL   nRBC 0.0 0.0 - 0.2 %    Comment: Performed at [facility], [address] [place], [place] [address]  CK     Status: None   Collection Time: 12/18/22  6:15 AM  Result Value Ref Range   Total CK 139 49 - 397 U/L    Comment: Performed at [facility], [address], [place], [place] [address]  Cortisol     Status: None   Collection Time: 12/18/22  6:50 AM  Result Value Ref Range   Cortisol, Plasma 2.2 ug/dL    Comment: (NOTE) AM    6.7 - 22.6 ug/dL PM   <34.7       ug/dL Performed at [facility], [address], [place], [place] [address]   Lactic acid, plasma     Status: Abnormal   Collection Time: 12/18/22  6:50 AM  Result Value Ref Range   Lactic Acid, Venous 2.6 (HH) 0.5 - 1.9 mmol/L    Comment: CRITICAL VALUE NOTED. VALUE IS CONSISTENT WITH PREVIOUSLY REPORTED/CALLED VALUE Performed at [facility] [place] [facility], [address] [place], [place]  [address]   Basic metabolic panel     Status: Abnormal   Collection Time: 12/18/22  9:00 AM  Result Value Ref Range   Sodium 137 135 - 145 mmol/L   Potassium 4.9 3.5 - 5.1 mmol/L   Chloride 106 98 - 111 mmol/L   CO2 21 (L) 22 - 32 mmol/L   Glucose, Bld 106 (H) 70 - 99 mg/dL    Comment: Glucose reference range applies only to samples taken after fasting for at least 8 hours.   BUN 24 (H) 6 - 20 mg/dL   Creatinine, Ser 7.82 0.61 - 1.24 mg/dL   Calcium 95.6 8.9 - 21.3 mg/dL   GFR, Estimated >08 >65 mL/min    Comment: (NOTE) Calculated using the CKD-EPI Creatinine Equation (2021)    Anion gap 10 5 - 15    Comment: Performed at [facility], [address], [place], [place] [address]  Lactic acid, plasma     Status: Abnormal   Collection Time: 12/18/22  9:00 AM  Result Value Ref Range   Lactic Acid, Venous 2.5 (HH) 0.5 - 1.9 mmol/L    Comment: CRITICAL RESULT CALLED TO, READ BACK BY AND VERIFIED WITH Tommy Rainwater RN AT [phone] BY D LONG Performed at [facility], [address], [place], [place] [address]   Sedimentation rate     Status: Abnormal   Collection Time: 12/18/22  9:00 AM  Result Value Ref Range   Sed Rate 40 (H) 0 - 16 mm/hr    Comment: Performed at [facility], [address] [place], [place] [address]  C-reactive protein     Status: Abnormal   Collection Time: 12/18/22  9:00 AM  Result Value Ref Range   CRP 2.1 (H) <1.0 mg/dL    Comment: Performed at [facility], [address] [place], [place] [address]  Troponin I (High Sensitivity)     Status: Abnormal   Collection Time: 12/18/22  9:00 AM  Result Value Ref Range   Troponin I (High Sensitivity) 19 (H) <18 ng/L    Comment: (NOTE) Elevated high sensitivity troponin I (hsTnI) values and significant  changes across serial measurements may suggest ACS but many other  chronic and acute conditions are known to elevate hsTnI results.  Refer to the "Links" section for chest pain algorithms and  additional  guidance. Performed at [facility], [address] [place], [place] [address]    CT Angio Chest PE W/Cm &/Or Wo Cm  Result Date: 12/17/2022 CLINICAL DATA:  Sharp chest pain and shortness of breath. History of liver cancer. PE suspected EXAM: CT ANGIOGRAPHY CHEST WITH CONTRAST TECHNIQUE: Multidetector CT imaging of the chest was performed using the standard protocol during bolus administration of intravenous contrast. Multiplanar CT image reconstructions and MIPs were obtained to evaluate the vascular anatomy. RADIATION DOSE REDUCTION: This exam was performed according to the departmental dose-optimization program which includes automated exposure control, adjustment of the mA and/or kV according to patient size and/or use of iterative reconstruction technique. CONTRAST:  75mL OMNIPAQUE IOHEXOL 350 MG/ML SOLN COMPARISON:  Chest radiograph 12/17/2022 and CT chest 11/16/2022 FINDINGS: Cardiovascular: Satisfactory opacification of the pulmonary arteries to the segmental level. No acute pulmonary embolism. Mild cardiomegaly. No pericardial effusion. Accessed right chest wall Port-A-Cath tip at the superior cavoatrial junction. Mediastinum/Nodes: Unremarkable esophagus. New subcarinal lymphadenopathy measuring 2.1 cm (5/57) and new 9 mm right hilar lymph node (5/63). Lungs/Pleura: Unchanged size of the 7 mm  subpleural nodule in the right middle lobe (6/87). 6 mm nodule in the medial right upper lobe (5/38), increased from 11/16/2022 when it measured 4 mm. Bibasilar atelectasis/scarring. Upper Abdomen: Multiple hypoattenuating liver masses. See report from same day CT of the abdomen and pelvis for further details. Musculoskeletal: No acute fracture or destructive osseous lesion Review of the MIP images confirms the above findings. IMPRESSION: 1. No evidence of pulmonary embolism. 2. New subcarinal and right hilar lymphadenopathy concerning for metastatic disease. 3. Increased size of a 6 mm  nodule in the medial right upper lobe, previously 4 mm. Unchanged size of the 7 mm subpleural nodule in the right middle lobe. Also concerning for metastatic disease. 4. Multiple hypoattenuating liver masses. See separate report from same day CT abdomen pelvis for further details. Electronically Signed   By: Minerva Fester M.D.   On: 12/17/2022 23:44   CT ABDOMEN PELVIS WO CONTRAST  Result Date: 12/17/2022 CLINICAL DATA:  Abdominal pain, acute, nonlocalized EXAM: CT ABDOMEN AND PELVIS WITHOUT CONTRAST TECHNIQUE: Multidetector CT imaging of the abdomen and pelvis was performed following the standard protocol without IV contrast. RADIATION DOSE REDUCTION: This exam was performed according to the departmental dose-optimization program which includes automated exposure control, adjustment of the mA and/or kV according to patient size and/or use of iterative reconstruction technique. COMPARISON:  CT angio chest 12/17/2022, CT abdomen pelvis 12/13/2022 FINDINGS: Lower chest: Partially visualized central venous catheter terminating within the right atrium. Right lower lobe atelectasis. Right base pulmonary nodule measuring 9 x 8 mm. Please see separately dictated CT angiography chest 12/17/2022. Hepatobiliary: Enlarged liver measuring up to 26 cm. Stable multiple hepatic lesions that are heterogeneous and centrally hypodense. The largest measures 9.9 x 9.4 cm. No gallstones, gallbladder wall thickening, or pericholecystic fluid. No biliary dilatation. Pancreas: No focal lesion. Normal pancreatic contour. No surrounding inflammatory changes. No main pancreatic ductal dilatation. Spleen: Normal in size without focal abnormality. Adrenals/Urinary Tract: No adrenal nodule bilaterally. No nephrolithiasis and no hydronephrosis. Excretion of previously administered intravenous contrast noted from bilateral kidneys. No ureterolithiasis or hydroureter. The urinary bladder is unremarkable. Stomach/Bowel: Sigmoid resection.  Stomach is within normal limits. No evidence of bowel wall thickening or dilatation. Stool throughout the colon. Appendix appears normal. Vascular/Lymphatic: No abdominal aorta or iliac aneurysm. Mild atherosclerotic plaque of the aorta and its branches. No abdominal, pelvic, or inguinal lymphadenopathy. Reproductive: Prostate is unremarkable. Other: No intraperitoneal free fluid. No intraperitoneal free gas. No organized fluid collection. Musculoskeletal: No abdominal wall hernia or abnormality. Healed anterior abdominal incision. No suspicious lytic or blastic osseous lesions. No acute displaced fracture. IMPRESSION: 1. Stable multiple hepatic lesions consistent with known metastases. 2. Stool throughout the colon-correlate for constipation. 3. Right base pulmonary nodule measuring 9 x 8 mm. Please see separately dictated CT angiography chest 12/17/2022. 4. Sigmoid resection Electronically Signed   By: Tish Frederickson M.D.   On: 12/17/2022 23:44   DG Chest 1 View  Result Date: 12/17/2022 CLINICAL DATA:  [id number] Chest pain [id number] EXAM: CHEST  1 VIEW COMPARISON:  CT chest 11/16/2022. chest x-ray 03/09/2016 FINDINGS: Accessed right chest wall Port-A-Cath with tip overlying the expected region of the superior cavoatrial junction. The heart and mediastinal contours are unchanged. Bibasilar atelectasis. No focal consolidation. No pulmonary edema. No pleural effusion. No pneumothorax. No acute osseous abnormality. IMPRESSION: No active disease. Electronically Signed   By: Tish Frederickson M.D.   On: 12/17/2022 22:16    Pending Labs Unresulted Labs (From admission, onward)  Start     Ordered   12/25/22 0500  Creatinine, serum  (enoxaparin (LOVENOX)    CrCl >/= 30 ml/min)  Weekly,   R     Comments: while on enoxaparin therapy    12/18/22 0620   12/19/22 0500  Basic metabolic panel  Tomorrow morning,   R        12/18/22 0620   12/19/22 0500  CBC  Tomorrow morning,   R        12/18/22 0620   12/19/22 0500   ACTH stimulation, 3 time points  (cosyntropin (CORTROSYN) test and labs)  Tomorrow morning,   R       Comments: For inpatient lab collection, please call lab to coordinate timing of collections with phlebotomy PRIOR to administering cosyntropin (CORTROSYN).    12/18/22 0837            Vitals/Pain Today's Vitals   12/18/22 1045 12/18/22 1100 12/18/22 1145 12/18/22 1200  BP: 110/68 105/69    Pulse: (!) 46 (!) 55 78 61  Resp: 15 11    Temp:      TempSrc:      SpO2: 96% 95% 99% 98%  Weight:      Height:      PainSc:        Isolation Precautions No active isolations  Medications Medications  ondansetron (ZOFRAN) injection 4 mg (4 mg Intravenous Given 12/18/22 1001)  acetaminophen (TYLENOL) tablet 650 mg (has no administration in time range)    Or  acetaminophen (TYLENOL) suppository 650 mg (has no administration in time range)  0.9 %  sodium chloride infusion ( Intravenous New Bag/Given 12/18/22 0557)  enoxaparin (LOVENOX) injection 40 mg (40 mg Subcutaneous Given 12/18/22 1002)  cosyntropin (CORTROSYN) injection 0.25 mg (has no administration in time range)  pantoprazole (PROTONIX) injection 40 mg (40 mg Intravenous Given 12/18/22 1001)  iohexol (OMNIPAQUE) 350 MG/ML injection 75 mL (75 mLs Intravenous Contrast Given 12/17/22 2326)  sodium chloride 0.9 % bolus 1,000 mL (0 mLs Intravenous Stopped 12/18/22 0135)  sodium chloride 0.9 % bolus 1,000 mL (0 mLs Intravenous Stopped 12/18/22 0135)  alum & mag hydroxide-simeth (MAALOX/MYLANTA) 200-200-20 MG/5ML suspension 30 mL (30 mLs Oral Given 12/18/22 0229)    And  lidocaine (XYLOCAINE) 2 % viscous mouth solution 15 mL (15 mLs Oral Given 12/18/22 0229)  famotidine (PEPCID) IVPB 20 mg premix (0 mg Intravenous Stopped 12/18/22 0256)  ondansetron (ZOFRAN) injection 4 mg (4 mg Intravenous Given 12/18/22 0435)  sodium zirconium cyclosilicate (LOKELMA) packet 10 g (10 g Oral Given 12/18/22 0436)  sodium chloride 0.9 % bolus 1,000 mL (0 mLs  Intravenous Stopped 12/18/22 0557)  oxyCODONE-acetaminophen (PERCOCET/ROXICET) 5-325 MG per tablet 2 tablet (2 tablets Oral Given 12/18/22 0525)  insulin aspart (novoLOG) injection 10 Units (10 Units Intravenous Given 12/18/22 0645)  dextrose 50 % solution 50 mL (50 mLs Intravenous Given 12/18/22 0646)  calcium gluconate 1 g/ 50 mL sodium chloride IVPB (0 mg Intravenous Stopped 12/18/22 0808)  chlorproMAZINE (THORAZINE) injection 25 mg (25 mg Intramuscular Given 12/18/22 1053)    Mobility walks     Focused Assessments Cardiac Assessment Handoff:  Cardiac Rhythm: Normal sinus rhythm (HR 65) Lab Results  Component Value Date   CKTOTAL 139 12/18/2022   No results found for: "DDIMER" Does the Patient currently have chest pain? No   , Pulmonary Assessment Handoff:  Lung sounds: Bilateral Breath Sounds: Clear L Breath Sounds: Clear R Breath Sounds: Clear O2 Device: Room Air  R Recommendations: See Admitting Provider Note  Report given to:   Additional Notes:

## 2022-12-18 NOTE — Progress Notes (Signed)
Port assessment: Chemo infusion complete. Pump disconnected. Discussed disposition of chemo pump with [facility] RN. Pt is to take pump back to office when he returns. Pt and nurse Porsche aware.

## 2022-12-18 NOTE — Progress Notes (Signed)
  Echocardiogram 2D Echocardiogram has been performed.  Richard Bowman 12/18/2022, 11:49 AM

## 2022-12-18 NOTE — H&P (Signed)
History and Physical    Patient: Richard Bowman ZOX:[id number] DOB: 03/24/1976 DOA: 12/17/2022 DOS: the patient was seen and examined on 12/18/2022 PCP: Roney Jaffe, PA-C  Patient coming from: Home  Chief Complaint:  Chief Complaint  Patient presents with   Shortness of Breath   HPI: Richard Bowman is a 47 y.o. male with medical history significant of diagnosis of metastatic colon cancer with metastasis to the liver with some metastasis to the lungs as represented by pulmonary nodules.  Patient is a 89 started on fluorouracil with first dose completing infusion today.  He presented to the ED with complaints of chest pain and shortness of breath.  He has recently been treated for severe constipation related to his pain medications.  Patient reports that he really has not had to take all the extra laxatives noting that by Sunday morning he was able to pass bowel movements as usual.  He has not had any nausea or vomiting.  Although he is active he has not had any excessive sweating or increase in activity.  While in the ER he was found to have acute kidney injury with hyperkalemia and EKG changes consistent with hyperkalemia.  His only offending medication is ibuprofen which she only takes about 600 mg daily as needed typically he alternates this with his Oxy IR 10 mg daily in an attempt to decrease his use of narcotics.  Several days before chest pain began patient was reporting near intractable hiccups.  After the hiccups began he began having some substernal epigastric chest pain that did not radiate and was not associated with exertion.  Again he has not had any nausea and vomiting.  He presented to the ER he was afebrile and normotensive with baseline bradycardia.  As noted his labs revealed mild acute kidney injury with hyperkalemia.  EDP treated as dehydration giving him multiple saline boluses as well as Lokelma dose.  He also had elevated lactic acid of 3 that has not significantly  improved after hydration.  His initial potassium was 5.5 noting 24 hours prior his potassium was 4.3 and his renal function was at baseline.  Given his liver metastasis he has mild elevation in AST and ALT which are unchanged from previous labs.  Initial troponin was normal and has increased slightly to 19 since arrival.  EKG more consistent with ST elevation related to hyperkalemia.  Aspirin level was normal.   Hospitalist has subsequently evaluated this patient for admission.  An ESR has been checked and was 40 and random serum cortisol was 2.1.  White count has increased to 19,500.  Serial EKGs continue to demonstrate mild ST segment elevation again consistent with recent hyperkalemia.  No QTc prolongation.  Patient continues to have hiccups and midsternal chest discomfort consistent with reflux.  The patient also described as similar to reflux.  At this juncture it is unclear if some of his symptoms are related to the fluorouracil.  This medication is set to complete infusion today.  Dr. Myna Hidalgo and his office has been notified and they will assist in removal of the pump.  They are aware of the abnormal lab findings.   Review of Systems: As mentioned in the history of present illness. All other systems reviewed and are negative. Past Medical History:  Diagnosis Date   Cancer    Esophageal ulcer 2007   Renal disorder    CKD 10/28/20 "doesn't have it now"   Past Surgical History:  Procedure Laterality Date   BIOPSY  11/17/2022   Procedure: BIOPSY;  Surgeon: Napoleon Form, MD;  Location: [facility] ENDOSCOPY;  Service: Gastroenterology;;   COLON RESECTION SIGMOID N/A 11/18/2022   Procedure: OPEN SIGMOID COLECTOMY;  Surgeon: Manus Rudd, MD;  Location: [facility] OR;  Service: General;  Laterality: N/A;   COLONOSCOPY WITH PROPOFOL N/A 11/17/2022   Procedure: COLONOSCOPY WITH PROPOFOL;  Surgeon: Napoleon Form, MD;  Location: [facility] ENDOSCOPY;  Service: Gastroenterology;  Laterality: N/A;    ESOPHAGOGASTRODUODENOSCOPY (EGD) WITH PROPOFOL N/A 11/17/2022   Procedure: ESOPHAGOGASTRODUODENOSCOPY (EGD) WITH PROPOFOL;  Surgeon: Napoleon Form, MD;  Location: [facility] ENDOSCOPY;  Service: Gastroenterology;  Laterality: N/A;   IR IMAGING GUIDED PORT INSERTION  12/14/2022   LIVER BIOPSY N/A 11/18/2022   Procedure: LIVER BIOPSY;  Surgeon: Manus Rudd, MD;  Location: [facility] OR;  Service: General;  Laterality: N/A;   SUBMUCOSAL TATTOO INJECTION  11/17/2022   Procedure: SUBMUCOSAL TATTOO INJECTION;  Surgeon: Napoleon Form, MD;  Location: [facility] ENDOSCOPY;  Service: Gastroenterology;;   Social History:  reports that he quit smoking about 4 weeks ago. His smoking use included cigarettes. He has a 26.00 pack-year smoking history. He has been exposed to tobacco smoke. He has never used smokeless tobacco. He reports current alcohol use. He reports that he does not use drugs.  No Known Allergies  Family History  Problem Relation Age of Onset   COPD Mother    Cancer Mother     Prior to Admission medications   Medication Sig Start Date End Date Taking? Authorizing Provider  acetaminophen (TYLENOL) 500 MG tablet Take 2 tablets (1,000 mg total) by mouth every 6 (six) hours as needed for mild pain. 11/22/22   Meuth, Brooke A, PA-C  dexamethasone (DECADRON) 4 MG tablet Take 2 tablets (8 mg total) by mouth daily for 3 days, starting the day after chemotherapy. Take with food. 12/01/22   Josph Macho, MD  docusate sodium (COLACE) 100 MG capsule Take 1 capsule (100 mg total) by mouth 2 (two) times daily. 11/23/22   Kathlen Mody, MD  feeding supplement (ENSURE ENLIVE / ENSURE PLUS) LIQD Take 237 mLs by mouth 2 (two) times daily between meals. Patient not taking: Reported on 12/01/2022 11/23/22   Kathlen Mody, MD  fluconazole (DIFLUCAN) 100 MG tablet Take 1 tablet (100 mg total) by mouth daily. 12/16/22   Erenest Blank, NP  hydrocortisone (ANUSOL-HC) 2.5 % rectal cream Place rectally 3 (three) times daily as  needed for hemorrhoids or anal itching. 11/23/22   Kathlen Mody, MD  ibuprofen (ADVIL) 200 MG tablet Take 3 tablets (600 mg total) by mouth every 6 (six) hours as needed for moderate pain. 11/22/22   Meuth, Lina Sar, PA-C  lactulose (CHRONULAC) 10 GM/15ML solution Take 15 mLs (10 g total) by mouth 3 (three) times daily. 12/04/22   Josph Macho, MD  lidocaine-prilocaine (EMLA) cream Apply 1 Application topically as needed. 12/16/22   Erenest Blank, NP  loperamide (IMODIUM A-D) 2 MG tablet Take 2 tabs by mouth with first loose stool, then 1 tablet with each additional loose stool as needed. Do not exceed 8 tablets in a 24-hour period. 12/01/22   Josph Macho, MD  methocarbamol (ROBAXIN) 500 MG tablet Take 1 tablet (500 mg total) by mouth every 6 (six) hours as needed for muscle spasms. 11/23/22   Kathlen Mody, MD  nicotine (NICODERM CQ - DOSED IN MG/24 HOURS) 21 mg/24hr patch Place 1 patch (21 mg total) onto the skin daily. Patient not taking: Reported on  12/01/2022 11/23/22 11/23/23  Kathlen Mody, MD  nystatin (MYCOSTATIN) 100000 UNIT/ML suspension Take 5 mLs (500,000 Units total) by mouth 4 (four) times daily. 12/16/22   Erenest Blank, NP  ondansetron (ZOFRAN) 8 MG tablet Take 1 tablet (8 mg total) by mouth every 8 (eight) hours as needed for nausea or vomiting. Start on the third day after cisplatin. 12/01/22   Josph Macho, MD  Oxycodone HCl 10 MG TABS Take 0.5-1 tablets (5-10 mg total) by mouth every 6 (six) hours as needed. 12/01/22   Josph Macho, MD  polyethylene glycol (MIRALAX / GLYCOLAX) 17 g packet Take 17 g by mouth daily as needed for mild constipation. 11/23/22   Kathlen Mody, MD  prochlorperazine (COMPAZINE) 10 MG tablet Take 1 tablet (10 mg total) by mouth every 6 (six) hours as needed for nausea or vomiting. 12/01/22   Josph Macho, MD    Physical Exam: Vitals:   12/18/22 0330 12/18/22 0415 12/18/22 0500 12/18/22 0545  BP: 101/63 100/69 117/82 116/75  Pulse: 68 (!) 52 (!)  55 (!) 49  Resp: 20     Temp:      TempSrc:      SpO2: 100% 100% 98% 99%  Weight:      Height:       Constitutional: NAD, calm, comfortable and otherwise appears to be healthy 47 year old male Eyes: PERRL, lids and conjunctivae normal ENMT: Mucous membranes are moist. Posterior pharynx clear of any exudate or lesions.Normal dentition.  Neck: normal, supple, no masses, no thyromegaly Respiratory: clear to auscultation bilaterally, no wheezing, no crackles. Normal respiratory effort. No accessory muscle use.  Cardiovascular: Regular rate and rhythm, no murmurs / rubs / gallops. No extremity edema. 2+ pedal pulses.  Abdomen: no tenderness, no masses palpated. No hepatosplenomegaly. Bowel sounds positive.  Recurrent hiccups causing substernal chest pain and reflux symptoms Musculoskeletal: no clubbing / cyanosis. No joint deformity upper and lower extremities. Good ROM, no contractures. Normal muscle tone.  Skin: no rashes, lesions, ulcers. No induration Neurologic: CN 2-12 grossly intact. Sensation intact,  Strength 5/5 x all 4 extremities.  Psychiatric: Normal judgment and insight. Alert and oriented x 3. Normal mood.  Other: External infusion pump in place for fluorouracil  Data Reviewed:  As per HPI  Follow-up labs this morning and now reveal a sodium of 137, potassium 4.9 (after administration of D50, insulin and calcium gluconate x 1), CO2 21, BUN 24 creatinine 1.10, repeat troponin up to 19, CK is normal at 139, WBC now up to 19,500, hemoglobin 11.6, ESR 40, random plasma cortisol 2.2  EKGs as noted in HPI CT abdomen and pelvis shows stable hepatic lesions, continued constipation, basilar pulmonary nodule subcentimeter in size.  Adrenals are noted to have no nodules bilaterally.  CTA of chest reveals no PE but there is new subcarinal and right hilar lymphadenopathy concerning for metastatic disease.  Increased size of 6 mm nodule in the right medial upper lobe otherwise previous  documented nodes unchanged.  Assessment and Plan: Chest pain and shortness of breath/abnormal troponin Reason for presentation Patient admits to lifting while he is having this chest discomfort.  Chest discomfort began after patient developed intractable hiccups and noted the hiccups would get worse with drinking water. No exertional component to the chest pain and chest pain does not radiate although does seem to be worse when he leans forward. I have discussed with Dr. Jacinto Halim with cardiology and they do not believe the chest pain is related to ischemic  etiology or pericarditis Second troponin did increase slightly but this potentially could be related to issues with his renal function and have now resolved. Patient is on fluorouracil and as a precaution we will check an echocardiogram  AKI with hyperkalemia Etiology unclear History does not support dehydration and patient states he only took 1 or 2 doses of recommended laxatives until he started having what he describes as normal bowel movements on Sunday Baseline creatinine 1.22 with peak to 1.27 with most recent creatinine down to 1.10.  Mild metabolic acidemia also improving Labs have improved with combination of fluid and medications to treat hyperkalemia (Lokelma, insulin/D50/calcium gluconate.) Current potassium down to 4.9 so can likely discontinue Lokelma and repeat labs to see if potassium goes back up Unclear at this juncture if hyperkalemia is related to his chemotherapeutic agent. Continue to avoid offending medications such as NSAIDs, ACE inhibitors etc. CK normal and no evidence of rhabdomyolysis  Low random serum cortisol Likely related to acute stressors Will check cosyntropin stim test overnight  Elevated lactic acid Does not appear to be related to dehydration and has only minimally improved with hydration CK is normal Continue to trend  Colon cancer with hepatic and pulmonary mets Dr. Myna Hidalgo aware of  admission Fluorouracil due to complete infusion today and oncology clinic will assist with removal and disposition of pump  Constipation Despite patient's as rotations that he is having normal bowel movements constipation has been demonstrated on recent imaging Instead of laxatives may need to start with scheduled stool softeners and advance treatment from that point Continue to improve fluid intake and will continue IV fluids for now  Intractable hiccups Unclear etiology potentially related to chemo Will give one-time dose of Thorazine 25 mg IM noting QTc normal  Reflux/GERD Possibly related to combination of constipation and recurrent hiccups Start Protonix 40 mg daily.  This will need to be changed to OTC at time of discharge    Advance Care Planning:   Code Status: Full Code   VTE prophylaxis: Lovenox  Consults: Oncology, cardiology  Family Communication: Patient only  Severity of Illness: The appropriate patient status for this patient is OBSERVATION. Observation status is judged to be reasonable and necessary in order to provide the required intensity of service to ensure the patient's safety. The patient's presenting symptoms, physical exam findings, and initial radiographic and laboratory data in the context of their medical condition is felt to place them at decreased risk for further clinical deterioration. Furthermore, it is anticipated that the patient will be medically stable for discharge from the hospital within 2 midnights of admission.   Author: Junious Silk, NP 12/18/2022 6:21 AM  For on call review [email or web address].

## 2022-12-18 NOTE — ED Notes (Addendum)
Pt sleepy after thorazine, awake, interactive, calm, NAD. Pt updated.

## 2022-12-18 NOTE — Plan of Care (Signed)
  Problem: Clinical Measurements: Goal: Ability to maintain clinical measurements within normal limits will improve Outcome: Progressing   

## 2022-12-18 NOTE — Progress Notes (Signed)
EKG reviewed. Earlier had discussed with NP CP felt to be musculoskeletal. No ST depression, global STE. Maybe rate related vs pericarditis. Trops negative x4.   Another trop ordered by the team and to call if elevated. Do not start heparin. Chemo induced clinical syndrome vs mets. D/W primary team from [facility] (hospitalist) who told me that she would get in touch with the hospitalist at [facility] and ask to evaluate. Advised her that I am available to see patient if felt cardiac  JG

## 2022-12-18 NOTE — Progress Notes (Signed)
  Carryover admission to the Day Admitter.  I discussed this case with the EDP, Solon Augusta, PA.  Per these discussions:   This is a 47 year old male with colon cancer and mets to the liver, possibly also to the lungs, undergoing chemotherapy, who is being admitted with dehydration, hyperkalemia after presenting with intractable nausea/vomiting.  He also had some pleuritic chest discomfort earlier yesterday, for which she underwent CTA chest, which showed no evidence of acute pulmonary embolism.  Initially tachycardic, improving slightly with IV fluids.  Initial serum potassium level 5.5, which is improved to 5.2 with interval IV fluids.  He is also now received a dose of Lokelma.  He appears dehydrated per report of EDP, and has persistent nausea/vomiting in the ED and spite of Zofran.  I have placed an order for observation to med/tele for further evaluation management of hyperkalemia, dehydration, intractable nausea/vomiting.    I have placed some additional preliminary admit orders via the adult multi-morbid admission order set. I have also ordered A repeat BMP to be checked at 8 AM.  Also ordered continuous normal saline at 125 cc/h and prn Zofran.    Newton Pigg, DO Hospitalist

## 2022-12-18 NOTE — ED Notes (Signed)
Up to b/r, steady gait, alert, NAD, calm, interactive.  

## 2022-12-18 NOTE — Significant Event (Addendum)
Rapid Response Event Note   Reason for Call : CP 10/10 Initial Focused Assessment:  Nursing staff notified me regarding Mr. Lucena having 10/10 CP, pointing to epigastric area and stating exact same pain as earlier today. No radiating pain. Skin is warm, pink and diaphoretic. Mild SOB with nausea. 12 lead EKG shows ST with STE in inferiolateral leads. SL NTG x 3 given q 5 mins. Pain is reduced to 4/5 out of 10. Trop I have been 19 and 15.  Dr. Odis Hollingshead notified over the phone and will discuss with Dr. Jacinto Halim. Awaiting call back with plan of care.  Hospitalist will come and evaluate pt at bedside.   2128-ST 111, 114/74 (86), RR 18 with sats 96% on 3L [place]   Interventions:  -12 Lead EKG stat -SL NTG 0.4mg  q x 3        MD Notified: Dr. Odis Hollingshead notified over the phone Call Time: 2059 Arrival Time: 2105 End Time: 2155  Rose Fillers, RN

## 2022-12-18 NOTE — Hospital Course (Addendum)
47 year old unfortunate gentleman with metastatic colon/sigmoid colon cancer recently admitted 3/17 and discharged 3/25 Found to have metastatic adenocarcinoma of the colon mets to the liver, also has pulmonary nodules x 2 status post sigmoid colectomy and liver biopsy on 11/18/2022 underwent Port-A-Cath placement recently followed by Dr. Myna Hidalgo and is started on chemotherapy infusion on Tuesday presented to the ED with pleuritic chest pain, shortness of breath, nausea vomiting. In the ED workup showed mild hyperkalemia, acute renal failure.  Had CT angio chest and abdomen pelvis-showed stable multiple hepatic lesion with known metastasis, constipation, right base pulmonary nodule, status post sigmoid resection, No-PE, new subcarinal and right hilar lymphadenopathy concerning for metastatic disease and increased in size of 6 mm nodule previously 4 mm on medial right upper lobe and 7 mm subpleural nodule in the right middle lobe concerning for metastatic disease. Patient was admitted for further management.He currently feels much better no more chest pain.  Remains on room air no abdominal pain lower extremity swelling or  fever chills. On admission reviewed w/ Cardiology Dr. Jacinto Halim no ST depression had global ST elevation may be related to rate versus pericarditis serial troponins are negative x 4. Patient admitted, continued on pain management, IV fluid hydration> AKI improved potassium improved, found to have adrenal insufficiency placed on hydrocortisone.  At this time pain is well-controlled no recurrence of chest pain, and is requesting for discharge.  Follow-up with oncology

## 2022-12-19 DIAGNOSIS — R079 Chest pain, unspecified: Secondary | ICD-10-CM | POA: Diagnosis present

## 2022-12-19 DIAGNOSIS — E872 Acidosis, unspecified: Secondary | ICD-10-CM | POA: Diagnosis not present

## 2022-12-19 DIAGNOSIS — C787 Secondary malignant neoplasm of liver and intrahepatic bile duct: Secondary | ICD-10-CM | POA: Diagnosis not present

## 2022-12-19 DIAGNOSIS — K219 Gastro-esophageal reflux disease without esophagitis: Secondary | ICD-10-CM | POA: Diagnosis present

## 2022-12-19 DIAGNOSIS — E875 Hyperkalemia: Secondary | ICD-10-CM | POA: Diagnosis not present

## 2022-12-19 DIAGNOSIS — R066 Hiccough: Secondary | ICD-10-CM | POA: Diagnosis present

## 2022-12-19 DIAGNOSIS — Z95828 Presence of other vascular implants and grafts: Secondary | ICD-10-CM | POA: Diagnosis not present

## 2022-12-19 DIAGNOSIS — C7801 Secondary malignant neoplasm of right lung: Secondary | ICD-10-CM | POA: Diagnosis not present

## 2022-12-19 DIAGNOSIS — N182 Chronic kidney disease, stage 2 (mild): Secondary | ICD-10-CM | POA: Diagnosis not present

## 2022-12-19 DIAGNOSIS — D630 Anemia in neoplastic disease: Secondary | ICD-10-CM | POA: Diagnosis not present

## 2022-12-19 DIAGNOSIS — Z85038 Personal history of other malignant neoplasm of large intestine: Secondary | ICD-10-CM | POA: Diagnosis not present

## 2022-12-19 DIAGNOSIS — Z79899 Other long term (current) drug therapy: Secondary | ICD-10-CM | POA: Diagnosis not present

## 2022-12-19 DIAGNOSIS — Z825 Family history of asthma and other chronic lower respiratory diseases: Secondary | ICD-10-CM | POA: Diagnosis not present

## 2022-12-19 DIAGNOSIS — Z9049 Acquired absence of other specified parts of digestive tract: Secondary | ICD-10-CM | POA: Diagnosis not present

## 2022-12-19 DIAGNOSIS — Z87891 Personal history of nicotine dependence: Secondary | ICD-10-CM | POA: Diagnosis not present

## 2022-12-19 DIAGNOSIS — R0602 Shortness of breath: Secondary | ICD-10-CM | POA: Diagnosis present

## 2022-12-19 DIAGNOSIS — R0789 Other chest pain: Secondary | ICD-10-CM | POA: Diagnosis present

## 2022-12-19 DIAGNOSIS — R7989 Other specified abnormal findings of blood chemistry: Secondary | ICD-10-CM | POA: Diagnosis present

## 2022-12-19 DIAGNOSIS — R001 Bradycardia, unspecified: Secondary | ICD-10-CM | POA: Diagnosis present

## 2022-12-19 DIAGNOSIS — E883 Tumor lysis syndrome: Secondary | ICD-10-CM | POA: Diagnosis not present

## 2022-12-19 DIAGNOSIS — Z8719 Personal history of other diseases of the digestive system: Secondary | ICD-10-CM | POA: Diagnosis not present

## 2022-12-19 DIAGNOSIS — E860 Dehydration: Secondary | ICD-10-CM | POA: Diagnosis not present

## 2022-12-19 DIAGNOSIS — E2740 Unspecified adrenocortical insufficiency: Secondary | ICD-10-CM | POA: Diagnosis not present

## 2022-12-19 DIAGNOSIS — N179 Acute kidney failure, unspecified: Secondary | ICD-10-CM | POA: Diagnosis not present

## 2022-12-19 LAB — CBC
HCT: 32.7 % — ABNORMAL LOW (ref 39.0–52.0)
Hemoglobin: 10.9 g/dL — ABNORMAL LOW (ref 13.0–17.0)
MCH: 29.3 pg (ref 26.0–34.0)
MCHC: 33.3 g/dL (ref 30.0–36.0)
MCV: 87.9 fL (ref 80.0–100.0)
Platelets: 256 10*3/uL (ref 150–400)
RBC: 3.72 MIL/uL — ABNORMAL LOW (ref 4.22–5.81)
RDW: 12.9 % (ref 11.5–15.5)
WBC: 14.4 10*3/uL — ABNORMAL HIGH (ref 4.0–10.5)
nRBC: 0 % (ref 0.0–0.2)

## 2022-12-19 LAB — BASIC METABOLIC PANEL WITH GFR
Anion gap: 5 (ref 5–15)
BUN: 29 mg/dL — ABNORMAL HIGH (ref 6–20)
CO2: 25 mmol/L (ref 22–32)
Calcium: 9.3 mg/dL (ref 8.9–10.3)
Chloride: 105 mmol/L (ref 98–111)
Creatinine, Ser: 1.25 mg/dL — ABNORMAL HIGH (ref 0.61–1.24)
GFR, Estimated: >60 mL/min
Glucose, Bld: 95 mg/dL (ref 70–99)
Potassium: 4.4 mmol/L (ref 3.5–5.1)
Sodium: 135 mmol/L (ref 135–145)

## 2022-12-19 LAB — PHOSPHORUS: Phosphorus: 4.5 mg/dL (ref 2.5–4.6)

## 2022-12-19 LAB — ACTH STIMULATION, 3 TIME POINTS
Cortisol, 30 Min: 13.9 ug/dL
Cortisol, 60 Min: 17.7 ug/dL
Cortisol, Base: 4.1 ug/dL

## 2022-12-19 LAB — TROPONIN T: Troponin T (Highly Sensitive): 14 ng/L (ref 0–22)

## 2022-12-19 LAB — MAGNESIUM: Magnesium: 2.3 mg/dL (ref 1.7–2.4)

## 2022-12-19 LAB — URIC ACID: Uric Acid, Serum: 11.4 mg/dL — ABNORMAL HIGH (ref 3.7–8.6)

## 2022-12-19 MED ORDER — HYDROMORPHONE HCL 1 MG/ML IJ SOLN
1.0000 mg | Freq: Once | INTRAMUSCULAR | Status: AC
  Administered 2022-12-19: 1 mg via INTRAVENOUS
  Filled 2022-12-19: qty 1

## 2022-12-19 MED ORDER — KETOROLAC TROMETHAMINE 15 MG/ML IJ SOLN
15.0000 mg | Freq: Four times a day (QID) | INTRAMUSCULAR | Status: DC | PRN
  Administered 2022-12-19 – 2022-12-20 (×2): 15 mg via INTRAVENOUS
  Filled 2022-12-19 (×2): qty 1

## 2022-12-19 MED ORDER — HYDROCORTISONE 5 MG PO TABS
5.0000 mg | ORAL_TABLET | Freq: Three times a day (TID) | ORAL | Status: DC
  Administered 2022-12-19 – 2022-12-20 (×2): 5 mg via ORAL
  Filled 2022-12-19 (×5): qty 1

## 2022-12-19 MED ORDER — HYDROMORPHONE HCL 1 MG/ML IJ SOLN
0.5000 mg | INTRAMUSCULAR | Status: DC | PRN
  Administered 2022-12-19 (×4): 1 mg via INTRAVENOUS
  Filled 2022-12-19 (×4): qty 1

## 2022-12-19 NOTE — Progress Notes (Addendum)
    Patient Name: [name]           DOB: 12/02/1975  MRN: [id number]      Admission Date: 12/17/2022  Attending Provider: Lanae Boast, MD  Primary Diagnosis: Hyperkalemia   Level of care: Telemetry Medical    CROSS COVER NOTE   Date of Service   12/19/2022   Richard Bowman, 47 y.o. male, was admitted on 12/17/2022 for Hyperkalemia.    HPI/Events of Note   2130- chest pain 10/10, pain located closer to epigastric area.  Associated symptoms including diaphoretic, SOB, nausea.  Prior troponin levels negative.  Order was placed for another troponin level.  12-lead EKG obtained by nursing staff.  EKG reviewed with Dr. Jacinto Halim (cardiology), no ST depression, global STE.  Does not recommend starting heparin.  Pain may be associated to metastatic disease, chemo-induced, pericarditis?  Pain relieved after patient received IV morphine, Tylenol, and SL nitro.  Nursing staff to continue reassessing pain frequently to improve pain management.    2300-potassium 4.0, troponin 12.    0045-patient reporting increase in chest pain again.  Expresses that he feels that morphine does not help enough.  Will trial 1 mg IV Dilaudid for pain relief.   0130-RN reports patient is resting, Dilaudid effective in improving pain.    Interventions/ Plan   Above        Anthoney Harada, DNP, [facility] [place]

## 2022-12-19 NOTE — Progress Notes (Signed)
Pt reported having chest pain while trying to have a bowel movement. Medicated with prn dilaudid wit some relief reported. MD notified of this episode

## 2022-12-19 NOTE — Progress Notes (Signed)
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 Myna Hidalgo and is started on chemotherapy infusion on Tuesday presented to the ED with pleuritic chest pain, shortness of breath, nausea vomiting. In the ED workup showed mild hyperkalemia, acute renal failure.  Had CT angio chest and abdomen pelvis-showed stable multiple hepatic lesion with known metastasis, constipation, right base pulmonary nodule, status post sigmoid resection, No-PE, new subcarinal and right hilar lymphadenopathy concerning for metastatic disease and increased in size of 6 mm nodule previously 4 mm on medial right upper lobe and 7 mm subpleural nodule in the right middle lobe concerning for metastatic disease. Patient was admitted for further management.He currently feels much better no more chest pain.  Remains on room air no abdominal pain lower extremity swelling or  fever chills. On admission reviewed w/ Cardiology Dr. Jacinto Halim no ST depression had global ST elevation may be related to rate versus pericarditis serial troponins are negative x 4.   Subjective: Seen and examined this morning He reported this am his chest pain had improved he felt better after Dilaudid and nitroglycerin Again later was complaining of chest pain in afternoon Overnight patient had chest pain troponin was repeated along with EKG no ischemic findings, Dr. Jacinto Halim was contacted subsequently pain improved with Dilaudid Remains afebrile, heart rate in 80s, labs shows creatinine better at 1.2 improving leukocytosis hemoglobin 10.9  g.  Assessment and Plan: Principal Problem:   Hyperkalemia   Chest pain Mildly elevated troponin: admission reviewed w/ Cardiology Dr. Jacinto Halim no ST depression had global ST elevation may be related to rate versus pericarditis serial troponins are negative x 4.  Continue pain managed with IV and oral opiates, hold NSAIDs trial if still having chest pain. Echo from 4/19 EF 55-60% no RWMA, G1 DD normal RV function, mitral valve and aortic valve.  I discussed with Dr. Jacinto Halim from cardiology this morning-?  Tumor lysis syndrome causing symptoms.-I added further labs uric acid.  Add Toradol> continue pain management.   AKI likely prerenal in the setting of chemotherapy:-Improved with IV fluid hydration 1.2, encourage oral hydration Recent Labs    12/01/22 1058 12/12/22 2338 12/16/22 0820 12/17/22 2150 12/17/22 2350 12/18/22 0230 12/18/22 0615 12/18/22 0900 12/18/22 2129 12/19/22 0429  BUN 12 13 11  27* 29* 27* 27* 24* 29* 29*  CREATININE 1.05 1.19 1.10 1.28* 1.22 1.27* 1.11 1.10 1.35* 1.25*    Hyperkalemia in the setting of AKI-resolved. Recent Labs  Lab 12/18/22 0230 12/18/22 0615 12/18/22 0900 12/18/22 2129 12/19/22 0429  K 5.5* 5.0 4.9 4.0 4.4    Low serum cortisol/concern for adrenal insufficiency: s/p cosyntropin test baseline cortisol 4.1 at 30-minute 13.9 at 60 minutes 17.7.Response suboptimal less than 18 indicative of adrenal insufficiency, given patient also presenting with hyperkalemia- we will start hydrocortisone bid. Will need outpatient follow-up with endocrine  Colon cancer with hepatic and pulmonary metastasis: Currently received his first chemoinfusion and discontinued 4/19, will be followed by Dr. Myna Hidalgo aware  Elevated lactic acid likely from dehydration and renal failure monitor Leukocytosis: In 19,000 range previously and 13,000, but is afebrile-chest x-ray, CT angio chest and CT abdomen no evidence of infection or pneumonia, wonder  if it is due to  chemotherapy/reactive.  Downtrending, monitor Recent Labs  Lab 12/12/22 2338 12/16/22 0820 12/17/22 2150 12/18/22 0615 12/19/22 0429  WBC 14.8* 13.1* 19.6* 19.5* 14.4*    Anemia likely from malignancy.  Monitor hemoglobin Recent Labs  Lab 12/16/22 0820 12/17/22 2150 12/17/22 2355 12/18/22 0615 12/19/22 0429  HGB 12.5* 14.6 13.3 11.6* 10.9*  HCT 37.5* 47.7 39.0 35.2* 32.7*   Constipation continue bowel regimen Intractable hiccups supportive care and meds PRN GERD/reflux continue PPI antiemetics  DVT prophylaxis: enoxaparin (LOVENOX) injection 40 mg Start: 12/18/22 1000 SCDs Start: 12/18/22 0531 Code Status:   Code Status: Full Code Family Communication: plan of care discussed with patient at bedside. Patient status is:  admitted as observation but remains hospitalized for ongoing  because of chest pain needing iv pain meds> changed to inpatient Level of care: Telemetry Medical   Dispo: The patient is from: home            Anticipated disposition: 1-2 days Objective: Vitals last 24 hrs: Vitals:   12/19/22 0121 12/19/22 0415 12/19/22 0500 12/19/22 0834  BP: 114/78 105/72  117/78  Pulse: (!) 102 98  83  Resp: 18 17  17   Temp:  97.8 F (36.6 C)  97.8 F (36.6 C)  TempSrc:  Oral  Oral  SpO2: 98% 99%  99%  Weight:   87.6 kg   Height:       Weight change: -4.4 kg  Physical Examination: General exam: alert awake, older than stated age HEENT:Oral mucosa moist, Ear/Nose WNL grossly Respiratory system: bilaterally clear BS, no use of accessory muscle Cardiovascular system: S1 & S2 +, No JVD. Gastrointestinal system: Abdomen soft,NT,ND, BS+ Nervous System:Alert, awake, moving extremities. Extremities: LE edema neg,distal peripheral pulses palpable.  Skin: No rashes,no icterus. MSK: Normal muscle bulk,tone, power  Medications reviewed:  Scheduled Meds:  Chlorhexidine Gluconate Cloth  6 each Topical Daily   enoxaparin (LOVENOX) injection  40 mg Subcutaneous Daily    pantoprazole (PROTONIX) IV  40 mg Intravenous Q12H   Continuous Infusions:  sodium chloride 125 mL/hr at 12/19/22 0509      Diet Order             Diet Heart Room service appropriate? Yes; Fluid consistency: Thin  Diet effective now                            Intake/Output Summary (Last 24 hours) at 12/19/2022 1400 Last data filed at 12/19/2022 1000 Gross per 24 hour  Intake 1076.34 ml  Output 800 ml  Net 276.34 ml   Net IO Since Admission: 3,426.34 mL [12/19/22 1400]  Wt Readings from Last 3 Encounters:  12/19/22 87.6 kg  12/14/22 83.9 kg  12/08/22 87.1 kg     Unresulted Labs (From admission, onward)     Start     Ordered   12/25/22 0500  Creatinine, serum  (enoxaparin (LOVENOX)    CrCl >/= 30 ml/min)  Weekly,   R     Comments: while on enoxaparin therapy    12/18/22 0620   12/19/22 1038  Uric acid  Once,   R       Question:  Specimen collection method  Answer:  IV Team=IV Team collect   12/19/22 1037   12/19/22 1038  Magnesium  Once,   R       Question:  Specimen collection method  Answer:  IV Team=IV Team collect   12/19/22 1037  12/19/22 1038  Phosphorus  Once,   R       Question:  Specimen collection method  Answer:  IV Team=IV Team collect   12/19/22 1037   12/18/22 1707  Troponin T  ONCE - STAT,   STAT        12/18/22 1707          Data Reviewed: I have personally reviewed following labs and imaging studies CBC: Recent Labs  Lab 12/12/22 2338 12/16/22 0820 12/17/22 2150 12/17/22 2355 12/18/22 0615 12/19/22 0429  WBC 14.8* 13.1* 19.6*  --  19.5* 14.4*  NEUTROABS 10.4* 9.2*  --   --   --   --   HGB 12.8* 12.5* 14.6 13.3 11.6* 10.9*  HCT 36.6* 37.5* 47.7 39.0 35.2* 32.7*  MCV 85.1 87.4 94.6  --  88.0 87.9  PLT 368 324 255  --  309 256   Basic Metabolic Panel: Recent Labs  Lab 12/18/22 0230 12/18/22 0615 12/18/22 0900 12/18/22 2129 12/19/22 0429  NA 137 135 137 140 135  K 5.5* 5.0 4.9 4.0 4.4  CL 103 103 106 104 105  CO2 23  19* 21* 22 25  GLUCOSE 122* 111* 106* 113* 95  BUN 27* 27* 24* 29* 29*  CREATININE 1.27* 1.11 1.10 1.35* 1.25*  CALCIUM 10.1 9.5 10.0 10.0 9.3  MG  --   --   --  2.4  --    GFR: Estimated Creatinine Clearance: 84.1 mL/min (A) (by C-G formula based on SCr of 1.25 mg/dL (H)). Liver Function Tests: Recent Labs  Lab 12/12/22 2338 12/16/22 0820 12/17/22 2350  AST 110* 109* 109*  ALT 60* 63* 77*  ALKPHOS 420* 475* 415*  BILITOT 0.9 0.5 0.5  PROT 7.4 7.2 7.0  ALBUMIN 3.2* 3.4* 3.0*   Recent Labs  Lab 12/12/22 2338  LIPASE 26   No results for input(s): "AMMONIA" in the last 168 hours. Coagulation Profile: No results for input(s): "INR", "PROTIME" in the last 168 hours. BNP (last 3 results) No results for input(s): "PROBNP" in the last 8760 hours. HbA1C: No results for input(s): "HGBA1C" in the last 72 hours. CBG: No results for input(s): "GLUCAP" in the last 168 hours. Lipid Profile: No results for input(s): "CHOL", "HDL", "LDLCALC", "TRIG", "CHOLHDL", "LDLDIRECT" in the last 72 hours. Thyroid Function Tests: No results for input(s): "TSH", "T4TOTAL", "FREET4", "T3FREE", "THYROIDAB" in the last 72 hours. Sepsis Labs: Recent Labs  Lab 12/17/22 2350 12/18/22 0146 12/18/22 0650 12/18/22 0900  LATICACIDVEN 3.0* 2.9* 2.6* 2.5*    No results found for this or any previous visit (from the past 240 hour(s)).  Antimicrobials: Anti-infectives (From admission, onward)    None      Culture/Microbiology    Component Value Date/Time   SDES SPUTUM 06/18/2008 0138   SPECREQUEST IMMUNE:NORM 06/18/2008 0138   CULT NO GROWTH 06/17/2008 0230   REPTSTATUS 06/18/2008 FINAL 06/18/2008 0138  Other culture-see note Radiology Studies: ECHOCARDIOGRAM COMPLETE  Result Date: 12/18/2022    ECHOCARDIOGRAM REPORT   Patient Name:   Richard Bowman Date of Exam: 12/18/2022 Medical Rec #:  [id number]        Height:       70.0 in Accession #:    [id number]       Weight:       202.8 lb Date of  Birth:  1976-05-02        BSA:          2.100 m Patient Age:    48  years         BP:           103/78 mmHg Patient Gender: M                HR:           55 bpm. Exam Location:  Inpatient Procedure: 2D Echo Indications:    other abnormalities of the heart  History:        Patient has no prior history of Echocardiogram examinations.                 Cancer; Risk Factors:Former Smoker.  Sonographer:    Delcie Roch RDCS Referring Phys: [id number] ALLISON L ELLIS IMPRESSIONS  1. Left ventricular ejection fraction, by estimation, is 55 to 60%. The left ventricle has normal function. The left ventricle has no regional wall motion abnormalities. Left ventricular diastolic parameters are consistent with Grade I diastolic dysfunction (impaired relaxation).  2. Right ventricular systolic function is normal. The right ventricular size is normal. Tricuspid regurgitation signal is inadequate for assessing PA pressure.  3. The mitral valve is normal in structure. No evidence of mitral valve regurgitation. No evidence of mitral stenosis.  4. The aortic valve is tricuspid. Aortic valve regurgitation is not visualized. No aortic stenosis is present.  5. The inferior vena cava is normal in size with <50% respiratory variability, suggesting right atrial pressure of 8 mmHg. FINDINGS  Left Ventricle: Left ventricular ejection fraction, by estimation, is 55 to 60%. The left ventricle has normal function. The left ventricle has no regional wall motion abnormalities. The left ventricular internal cavity size was normal in size. There is  no left ventricular hypertrophy. Left ventricular diastolic parameters are consistent with Grade I diastolic dysfunction (impaired relaxation). Right Ventricle: The right ventricular size is normal. No increase in right ventricular wall thickness. Right ventricular systolic function is normal. Tricuspid regurgitation signal is inadequate for assessing PA pressure. Left Atrium: Left atrial size was normal in  size. Right Atrium: Right atrial size was normal in size. Pericardium: There is no evidence of pericardial effusion. Mitral Valve: The mitral valve is normal in structure. No evidence of mitral valve regurgitation. No evidence of mitral valve stenosis. Tricuspid Valve: The tricuspid valve is normal in structure. Tricuspid valve regurgitation is not demonstrated. Aortic Valve: The aortic valve is tricuspid. Aortic valve regurgitation is not visualized. No aortic stenosis is present. Pulmonic Valve: The pulmonic valve was normal in structure. Pulmonic valve regurgitation is not visualized. Aorta: The aortic root is normal in size and structure. Venous: The inferior vena cava is normal in size with less than 50% respiratory variability, suggesting right atrial pressure of 8 mmHg. IAS/Shunts: No atrial level shunt detected by color flow Doppler.  LEFT VENTRICLE PLAX 2D LVIDd:         5.10 cm   Diastology LVIDs:         3.60 cm   LV e' medial:    6.53 cm/s LV PW:         1.00 cm   LV E/e' medial:  7.6 LV IVS:        1.00 cm   LV e' lateral:   8.16 cm/s LVOT diam:     2.30 cm   LV E/e' lateral: 6.1 LV SV:         107 LV SV Index:   51 LVOT Area:     4.15 cm  RIGHT VENTRICLE  IVC RV Basal diam:  2.90 cm     IVC diam: 2.00 cm RV S prime:     14.10 cm/s TAPSE (M-mode): 2.0 cm LEFT ATRIUM             Index        RIGHT ATRIUM           Index LA diam:        3.90 cm 1.86 cm/m   RA Area:     10.70 cm LA Vol (A2C):   53.3 ml 25.38 ml/m  RA Volume:   22.10 ml  10.52 ml/m LA Vol (A4C):   44.2 ml 21.05 ml/m LA Biplane Vol: 50.0 ml 23.81 ml/m  AORTIC VALVE LVOT Vmax:   115.00 cm/s LVOT Vmean:  69.400 cm/s LVOT VTI:    0.258 m  AORTA Ao Root diam: 3.60 cm MITRAL VALVE MV Area (PHT): 1.82 cm    SHUNTS MV Decel Time: 417 msec    Systemic VTI:  0.26 m MV E velocity: 49.70 cm/s  Systemic Diam: 2.30 cm MV A velocity: 34.30 cm/s MV E/A ratio:  1.45 Dalton McleanMD Electronically signed by Wilfred Lacy Signature  Date/Time: 12/18/2022/3:00:30 PM    Final    CT Angio Chest PE W/Cm &/Or Wo Cm  Result Date: 12/17/2022 CLINICAL DATA:  Sharp chest pain and shortness of breath. History of liver cancer. PE suspected EXAM: CT ANGIOGRAPHY CHEST WITH CONTRAST TECHNIQUE: Multidetector CT imaging of the chest was performed using the standard protocol during bolus administration of intravenous contrast. Multiplanar CT image reconstructions and MIPs were obtained to evaluate the vascular anatomy. RADIATION DOSE REDUCTION: This exam was performed according to the departmental dose-optimization program which includes automated exposure control, adjustment of the mA and/or kV according to patient size and/or use of iterative reconstruction technique. CONTRAST:  75mL OMNIPAQUE IOHEXOL 350 MG/ML SOLN COMPARISON:  Chest radiograph 12/17/2022 and CT chest 11/16/2022 FINDINGS: Cardiovascular: Satisfactory opacification of the pulmonary arteries to the segmental level. No acute pulmonary embolism. Mild cardiomegaly. No pericardial effusion. Accessed right chest wall Port-A-Cath tip at the superior cavoatrial junction. Mediastinum/Nodes: Unremarkable esophagus. New subcarinal lymphadenopathy measuring 2.1 cm (5/57) and new 9 mm right hilar lymph node (5/63). Lungs/Pleura: Unchanged size of the 7 mm subpleural nodule in the right middle lobe (6/87). 6 mm nodule in the medial right upper lobe (5/38), increased from 11/16/2022 when it measured 4 mm. Bibasilar atelectasis/scarring. Upper Abdomen: Multiple hypoattenuating liver masses. See report from same day CT of the abdomen and pelvis for further details. Musculoskeletal: No acute fracture or destructive osseous lesion Review of the MIP images confirms the above findings. IMPRESSION: 1. No evidence of pulmonary embolism. 2. New subcarinal and right hilar lymphadenopathy concerning for metastatic disease. 3. Increased size of a 6 mm nodule in the medial right upper lobe, previously 4 mm. Unchanged  size of the 7 mm subpleural nodule in the right middle lobe. Also concerning for metastatic disease. 4. Multiple hypoattenuating liver masses. See separate report from same day CT abdomen pelvis for further details. Electronically Signed   By: Minerva Fester M.D.   On: 12/17/2022 23:44   CT ABDOMEN PELVIS WO CONTRAST  Result Date: 12/17/2022 CLINICAL DATA:  Abdominal pain, acute, nonlocalized EXAM: CT ABDOMEN AND PELVIS WITHOUT CONTRAST TECHNIQUE: Multidetector CT imaging of the abdomen and pelvis was performed following the standard protocol without IV contrast. RADIATION DOSE REDUCTION: This exam was performed according to the departmental dose-optimization program which includes automated exposure control, adjustment of  the mA and/or kV according to patient size and/or use of iterative reconstruction technique. COMPARISON:  CT angio chest 12/17/2022, CT abdomen pelvis 12/13/2022 FINDINGS: Lower chest: Partially visualized central venous catheter terminating within the right atrium. Right lower lobe atelectasis. Right base pulmonary nodule measuring 9 x 8 mm. Please see separately dictated CT angiography chest 12/17/2022. Hepatobiliary: Enlarged liver measuring up to 26 cm. Stable multiple hepatic lesions that are heterogeneous and centrally hypodense. The largest measures 9.9 x 9.4 cm. No gallstones, gallbladder wall thickening, or pericholecystic fluid. No biliary dilatation. Pancreas: No focal lesion. Normal pancreatic contour. No surrounding inflammatory changes. No main pancreatic ductal dilatation. Spleen: Normal in size without focal abnormality. Adrenals/Urinary Tract: No adrenal nodule bilaterally. No nephrolithiasis and no hydronephrosis. Excretion of previously administered intravenous contrast noted from bilateral kidneys. No ureterolithiasis or hydroureter. The urinary bladder is unremarkable. Stomach/Bowel: Sigmoid resection. Stomach is within normal limits. No evidence of bowel wall thickening  or dilatation. Stool throughout the colon. Appendix appears normal. Vascular/Lymphatic: No abdominal aorta or iliac aneurysm. Mild atherosclerotic plaque of the aorta and its branches. No abdominal, pelvic, or inguinal lymphadenopathy. Reproductive: Prostate is unremarkable. Other: No intraperitoneal free fluid. No intraperitoneal free gas. No organized fluid collection. Musculoskeletal: No abdominal wall hernia or abnormality. Healed anterior abdominal incision. No suspicious lytic or blastic osseous lesions. No acute displaced fracture. IMPRESSION: 1. Stable multiple hepatic lesions consistent with known metastases. 2. Stool throughout the colon-correlate for constipation. 3. Right base pulmonary nodule measuring 9 x 8 mm. Please see separately dictated CT angiography chest 12/17/2022. 4. Sigmoid resection Electronically Signed   By: Tish Frederickson M.D.   On: 12/17/2022 23:44   DG Chest 1 View  Result Date: 12/17/2022 CLINICAL DATA:  [id number] Chest pain [id number] EXAM: CHEST  1 VIEW COMPARISON:  CT chest 11/16/2022. chest x-ray 03/09/2016 FINDINGS: Accessed right chest wall Port-A-Cath with tip overlying the expected region of the superior cavoatrial junction. The heart and mediastinal contours are unchanged. Bibasilar atelectasis. No focal consolidation. No pulmonary edema. No pleural effusion. No pneumothorax. No acute osseous abnormality. IMPRESSION: No active disease. Electronically Signed   By: Tish Frederickson M.D.   On: 12/17/2022 22:16     LOS: 0 days   Lanae Boast, MD [facility]  12/19/2022, 2:00 PM

## 2022-12-20 DIAGNOSIS — E875 Hyperkalemia: Secondary | ICD-10-CM | POA: Diagnosis not present

## 2022-12-20 MED ORDER — SENNOSIDES-DOCUSATE SODIUM 8.6-50 MG PO TABS
1.0000 | ORAL_TABLET | Freq: Two times a day (BID) | ORAL | Status: DC
  Administered 2022-12-20: 1 via ORAL
  Filled 2022-12-20: qty 1

## 2022-12-20 MED ORDER — HYDROCORTISONE 5 MG PO TABS: 5.0000 mg | ORAL_TABLET | Freq: Three times a day (TID) | ORAL | 0 refills | Status: DC

## 2022-12-20 MED ORDER — POLYETHYLENE GLYCOL 3350 17 G PO PACK: 17.0000 g | PACK | Freq: Every day | ORAL | Status: DC | PRN

## 2022-12-20 MED ORDER — HEPARIN SOD (PORK) LOCK FLUSH 100 UNIT/ML IV SOLN
500.0000 [IU] | INTRAVENOUS | Status: AC | PRN
  Administered 2022-12-20: 500 [IU]

## 2022-12-20 NOTE — Discharge Summary (Signed)
Physician Discharge Summary  Richard Bowman:[id number] DOB: [birth year] DOA: 12/17/2022  PCP: Roney Jaffe, PA-C  Admit date: 12/17/2022 Discharge date: 12/20/2022 Recommendations for Outpatient Follow-up:  Follow up with PCP in 1 weeks-call for appointment Please obtain BMP/CBC in one week Call Dr. Myna Hidalgo office tomorrow for further plan about pain management and chemotherapy Follow-up with endocrinology/Dr. Sharl Ma number is provided  Discharge Dispo: Home Discharge Condition: Stable Code Status:   Code Status: Full Code Diet recommendation:  Diet Order             Diet - low sodium heart healthy           Diet Heart Room service appropriate? 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 Myna Hidalgo and is started on chemotherapy infusion on Tuesday presented to the ED with pleuritic chest pain, shortness of breath, nausea vomiting. In the ED workup showed mild hyperkalemia, acute renal failure.  Had CT angio chest and abdomen pelvis-showed stable multiple hepatic lesion with known metastasis, constipation, right base pulmonary nodule, status post sigmoid resection, No-PE, new subcarinal and right hilar lymphadenopathy concerning for metastatic disease and increased in size of 6 mm nodule previously 4 mm on medial right upper lobe and 7 mm subpleural nodule in the right middle lobe concerning for metastatic disease. Patient was admitted for further management.He currently feels much better no more chest pain.  Remains on room air no abdominal pain lower extremity swelling or  fever chills. On admission  reviewed w/ Cardiology Dr. Jacinto Halim no ST depression had global ST elevation may be related to rate versus pericarditis serial troponins are negative x 4. Patient admitted, continued on pain management, IV fluid hydration> AKI improved potassium improved, found to have adrenal insufficiency placed on hydrocortisone.  At this time pain is well-controlled no recurrence of chest pain, and is requesting for discharge.  Follow-up with oncology     Discharge Diagnoses:  Principal Problem:   Hyperkalemia Active Problems:   Chest pain     Chest pain Mildly elevated troponin: On admission reviewed w/ Cardiology Dr. Jacinto Halim no evidence of acute ischemia.  Chest pain likely multifactorial in the setting of patient's chemo. ST elevation may be related to rate versus pericarditis> serial troponins are negative x 4. Echo from 4/19 EF 55-60% no RWMA, G1 DD normal RV function, mitral valve and aortic valve.  Pain has resolved, doing well.  Follow-up plan continue oxy/NSAIDs Tylenol at home    AKI likely prerenal in the setting of chemotherapy: Improved to 1.2 Recent Labs    12/01/22 1058 12/12/22 2338 12/16/22 0820 12/17/22 2150 12/17/22 2350 12/18/22 0230 12/18/22 0615 12/18/22 0900 12/18/22 2129 12/19/22 0429  BUN 27* 29* 27* 27* 24* 29* 29*  CREATININE 1.05 1.19 1.10 1.28* 1.22 1.27* 1.11 1.10 1.35* 1.25*     Hyperkalemia resolved-??Tumor lysis in the setting of chemotherapy- but no other evidence.  Possibly explained by WBC improving improved Recent Labs  Lab 12/18/22 0230 12/18/22 0615 12/18/22 0900 12/18/22 2129 12/19/22 0429  K 5.5* 5.0 4.9 4.0 4.4     Low serum  cortisol/concern for adrenal insufficiency: s/p cosyntropin test baseline cortisol 4.1 at 30-minute 13.9 at 60 minutes 17.7.Response suboptimal less than 18 indicative of adrenal insufficiency, given patient also presenting with hyperkalemia- started on hydrocortisone po > advised to follow-up with endocrinology as  outpatient   Colon cancer with hepatic and pulmonary metastasis: Currently received his first chemoinfusion and discontinued 4/19, will be followed by Dr. Myna Hidalgo aware  Elevated lactic acid likely from dehydration and renal failure monitor Leukocytosis: In 19,000 range previously and 13,000, but is afebrile-chest x-ray, CT angio chest and CT abdomen no evidence of infection or pneumonia, wonder if it is due to chemotherapy/reactive.  Downtrended to baseline level Recent Labs  Lab 12/16/22 0820 12/17/22 2150 12/18/22 0615 12/19/22 0429  WBC 13.1* 19.6* 19.5* 14.4*     Anemia likely from malignancy.  Monitor hemoglobin Recent Labs  Lab 12/16/22 0820 12/17/22 2150 12/17/22 2355 12/18/22 0615 12/19/22 0429  HGB 12.5* 14.6 13.3 11.6* 10.9*  HCT 37.5* 47.7 39.0 35.2* 32.7*   Constipation continue bowel regimen/stool softener Intractable hiccups supportive care and meds PRN GERD/reflux continue PPI antiemetics   Consults: Onco and cardio oh phone Subjective:  Alert awake and oriented  Nurse paged earlier " patient wants to leave" No recurrence of chest pain, requesting for discharge home today.  He will follow-up with endocrinology as outpatient  Discharge Exam: Vitals:   12/20/22 0500 12/20/22 0814  BP: 114/72 122/85  Pulse: 77 81  Resp: 17   Temp: 98.1 F (36.7 C) 98.3 F (36.8 C)  SpO2: 98% 96%   General: Pt is alert, awake, not in acute distress Cardiovascular: RRR, S1/S2 +, no rubs, no gallops Respiratory: CTA bilaterally, no wheezing, no rhonchi Abdominal: Soft, NT, ND, bowel sounds + Extremities: no edema, no cyanosis  Discharge Instructions  Discharge Instructions     Diet - low sodium heart healthy   Complete by: As directed    Discharge instructions   Complete by: As directed    YOU WILL NEED ENDOCRINE referral for adrenal issues  Please call Dr Myna Hidalgo office Monday to discuss about your pain  Please call call MD or return to ER for similar or  worsening recurring problem that brought you to hospital or if any fever,nausea/vomiting,abdominal pain, uncontrolled pain, chest pain,  shortness of breath or any other alarming symptoms.  Please follow-up your doctor as instructed in a week time and call the office for appointment.  Please avoid alcohol, smoking, or any other illicit substance and maintain healthy habits including taking your regular medications as prescribed.  You were cared for by a hospitalist during your hospital stay. If you have any questions about your discharge medications or the care you received while you were in the hospital after you are discharged, you can call the unit and ask to speak with the hospitalist on call if the hospitalist that took care of you is not available.  Once you are discharged, your primary care physician will handle any further medical issues. Please note that NO REFILLS for any discharge medications will be authorized once you are discharged, as it is imperative that you return to your primary care physician (or establish a relationship with a primary care physician if you do not have one) for your aftercare needs so that they can reassess your need for medications and monitor your lab values   Increase activity slowly   Complete by: As directed    No wound care   Complete by: As directed  Allergies as of 12/20/2022   No Known Allergies      Medication List     TAKE these medications    acetaminophen 500 MG tablet Commonly known as: TYLENOL Take 2 tablets (1,000 mg total) by mouth every 6 (six) hours as needed for mild pain.   dexamethasone 4 MG tablet Commonly known as: DECADRON Take 2 tablets (8 mg total) by mouth daily for 3 days, starting the day after chemotherapy. Take with food.   docusate sodium 100 MG capsule Commonly known as: COLACE Take 1 capsule (100 mg total) by mouth 2 (two) times daily.   lactose free nutrition Liqd Take 237 mLs by mouth daily as needed  (Supplement).   feeding supplement Liqd Take 237 mLs by mouth 2 (two) times daily between meals.   fluconazole 100 MG tablet Commonly known as: DIFLUCAN Take 1 tablet (100 mg total) by mouth daily.   hydrocortisone 5 MG tablet Commonly known as: CORTEF Take 1 tablet (5 mg total) by mouth 3 (three) times daily.   ibuprofen 200 MG tablet Commonly known as: ADVIL Take 3 tablets (600 mg total) by mouth every 6 (six) hours as needed for moderate pain.   lactulose 10 GM/15ML solution Commonly known as: CHRONULAC Take 15 mLs (10 g total) by mouth 3 (three) times daily.   lidocaine-prilocaine cream Commonly known as: EMLA Apply 1 Application topically as needed. What changed: reasons to take this   methocarbamol 500 MG tablet Commonly known as: ROBAXIN Take 1 tablet (500 mg total) by mouth every 6 (six) hours as needed for muscle spasms.   nicotine 21 mg/24hr patch Commonly known as: NICODERM CQ - dosed in mg/24 hours Place 1 patch (21 mg total) onto the skin daily.   nystatin 100000 UNIT/ML suspension Commonly known as: MYCOSTATIN Take 5 mLs (500,000 Units total) by mouth 4 (four) times daily.   ondansetron 8 MG tablet Commonly known as: Zofran Take 1 tablet (8 mg total) by mouth every 8 (eight) hours as needed for nausea or vomiting. Start on the third day after cisplatin.   Oxycodone HCl 10 MG Tabs Take 0.5-1 tablets (5-10 mg total) by mouth every 6 (six) hours as needed. What changed:  how much to take reasons to take this   polyethylene glycol 17 g packet Commonly known as: MIRALAX / GLYCOLAX Take 17 g by mouth daily as needed for mild constipation.   prochlorperazine 10 MG tablet Commonly known as: COMPAZINE Take 1 tablet (10 mg total) by mouth every 6 (six) hours as needed for nausea or vomiting.   SM Anti-Diarrheal 2 MG tablet Generic drug: loperamide Take 2 tabs by mouth with first loose stool, then 1 tablet with each additional loose stool as needed. Do not  exceed 8 tablets in a 24-hour period.        Follow-up Information     [name], [name], PA-C Follow up in 1 week(s).   Specialty: Physician Assistant Contact information: [address] [place] [address] [phone]         Talmage Coin, MD Follow up in 1 week(s).   Specialty: Endocrinology Contact information: [address] [place] [address] [phone]                No Known Allergies  The results of significant diagnostics from this hospitalization (including imaging, microbiology, ancillary and laboratory) are listed below for reference.    Microbiology: No results found for this or any previous visit (  from the past 240 hour(s)).  Procedures/Studies: ECHOCARDIOGRAM COMPLETE  Result Date: 12/18/2022    ECHOCARDIOGRAM REPORT   Patient Name:   DUB MACLELLAN Date of Exam: 12/18/2022 Medical Rec #:  [id number]        Height:       70.0 in Accession #:    [id number]       Weight:       202.8 lb Date of Birth:  1976/03/11        BSA:          2.100 m Patient Age:    46 years         BP:           103/78 mmHg Patient Gender: M                HR:           55 bpm. Exam Location:  Inpatient Procedure: 2D Echo Indications:    other abnormalities of the heart  History:        Patient has no prior history of Echocardiogram examinations.                 Cancer; Risk Factors:Former Smoker.  Sonographer:    Delcie Roch RDCS Referring Phys: [id number] ALLISON L ELLIS IMPRESSIONS  1. Left ventricular ejection fraction, by estimation, is 55 to 60%. The left ventricle has normal function. The left ventricle has no regional wall motion abnormalities. Left ventricular diastolic parameters are consistent with Grade I diastolic dysfunction (impaired relaxation).  2. Right ventricular systolic function is normal. The right ventricular size is normal. Tricuspid regurgitation signal is inadequate for assessing PA pressure.  3. The mitral valve is normal in  structure. No evidence of mitral valve regurgitation. No evidence of mitral stenosis.  4. The aortic valve is tricuspid. Aortic valve regurgitation is not visualized. No aortic stenosis is present.  5. The inferior vena cava is normal in size with <50% respiratory variability, suggesting right atrial pressure of 8 mmHg. FINDINGS  Left Ventricle: Left ventricular ejection fraction, by estimation, is 55 to 60%. The left ventricle has normal function. The left ventricle has no regional wall motion abnormalities. The left ventricular internal cavity size was normal in size. There is  no left ventricular hypertrophy. Left ventricular diastolic parameters are consistent with Grade I diastolic dysfunction (impaired relaxation). Right Ventricle: The right ventricular size is normal. No increase in right ventricular wall thickness. Right ventricular systolic function is normal. Tricuspid regurgitation signal is inadequate for assessing PA pressure. Left Atrium: Left atrial size was normal in size. Right Atrium: Right atrial size was normal in size. Pericardium: There is no evidence of pericardial effusion. Mitral Valve: The mitral valve is normal in structure. No evidence of mitral valve regurgitation. No evidence of mitral valve stenosis. Tricuspid Valve: The tricuspid valve is normal in structure. Tricuspid valve regurgitation is not demonstrated. Aortic Valve: The aortic valve is tricuspid. Aortic valve regurgitation is not visualized. No aortic stenosis is present. Pulmonic Valve: The pulmonic valve was normal in structure. Pulmonic valve regurgitation is not visualized. Aorta: The aortic root is normal in size and structure. Venous: The inferior vena cava is normal in size with less than 50% respiratory variability, suggesting right atrial pressure of 8 mmHg. IAS/Shunts: No atrial level shunt detected by color flow Doppler.  LEFT VENTRICLE PLAX 2D LVIDd:         5.10 cm   Diastology LVIDs:  3.60 cm   LV e'  medial:    6.53 cm/s LV PW:         1.00 cm   LV E/e' medial:  7.6 LV IVS:        1.00 cm   LV e' lateral:   8.16 cm/s LVOT diam:     2.30 cm   LV E/e' lateral: 6.1 LV SV:         107 LV SV Index:   51 LVOT Area:     4.15 cm  RIGHT VENTRICLE             IVC RV Basal diam:  2.90 cm     IVC diam: 2.00 cm RV S prime:     14.10 cm/s TAPSE (M-mode): 2.0 cm LEFT ATRIUM             Index        RIGHT ATRIUM           Index LA diam:        3.90 cm 1.86 cm/m   RA Area:     10.70 cm LA Vol (A2C):   53.3 ml 25.38 ml/m  RA Volume:   22.10 ml  10.52 ml/m LA Vol (A4C):   44.2 ml 21.05 ml/m LA Biplane Vol: 50.0 ml 23.81 ml/m  AORTIC VALVE LVOT Vmax:   115.00 cm/s LVOT Vmean:  69.400 cm/s LVOT VTI:    0.258 m  AORTA Ao Root diam: 3.60 cm MITRAL VALVE MV Area (PHT): 1.82 cm    SHUNTS MV Decel Time: 417 msec    Systemic VTI:  0.26 m MV E velocity: 49.70 cm/s  Systemic Diam: 2.30 cm MV A velocity: 34.30 cm/s MV E/A ratio:  1.45 Dalton McleanMD Electronically signed by Wilfred Lacy Signature Date/Time: 12/18/2022/3:00:30 PM    Final    CT Angio Chest PE W/Cm &/Or Wo Cm  Result Date: 12/17/2022 CLINICAL DATA:  Sharp chest pain and shortness of breath. History of liver cancer. PE suspected EXAM: CT ANGIOGRAPHY CHEST WITH CONTRAST TECHNIQUE: Multidetector CT imaging of the chest was performed using the standard protocol during bolus administration of intravenous contrast. Multiplanar CT image reconstructions and MIPs were obtained to evaluate the vascular anatomy. RADIATION DOSE REDUCTION: This exam was performed according to the departmental dose-optimization program which includes automated exposure control, adjustment of the mA and/or kV according to patient size and/or use of iterative reconstruction technique. CONTRAST:  75mL OMNIPAQUE IOHEXOL 350 MG/ML SOLN COMPARISON:  Chest radiograph 12/17/2022 and CT chest 11/16/2022 FINDINGS: Cardiovascular: Satisfactory opacification of the pulmonary arteries to the segmental  level. No acute pulmonary embolism. Mild cardiomegaly. No pericardial effusion. Accessed right chest wall Port-A-Cath tip at the superior cavoatrial junction. Mediastinum/Nodes: Unremarkable esophagus. New subcarinal lymphadenopathy measuring 2.1 cm (5/57) and new 9 mm right hilar lymph node (5/63). Lungs/Pleura: Unchanged size of the 7 mm subpleural nodule in the right middle lobe (6/87). 6 mm nodule in the medial right upper lobe (5/38), increased from 11/16/2022 when it measured 4 mm. Bibasilar atelectasis/scarring. Upper Abdomen: Multiple hypoattenuating liver masses. See report from same day CT of the abdomen and pelvis for further details. Musculoskeletal: No acute fracture or destructive osseous lesion Review of the MIP images confirms the above findings. IMPRESSION: 1. No evidence of pulmonary embolism. 2. New subcarinal and right hilar lymphadenopathy concerning for metastatic disease. 3. Increased size of a 6 mm nodule in the medial right upper lobe, previously 4 mm. Unchanged size of the 7 mm subpleural  nodule in the right middle lobe. Also concerning for metastatic disease. 4. Multiple hypoattenuating liver masses. See separate report from same day CT abdomen pelvis for further details. Electronically Signed   By: Minerva Fester M.D.   On: 12/17/2022 23:44   CT ABDOMEN PELVIS WO CONTRAST  Result Date: 12/17/2022 CLINICAL DATA:  Abdominal pain, acute, nonlocalized EXAM: CT ABDOMEN AND PELVIS WITHOUT CONTRAST TECHNIQUE: Multidetector CT imaging of the abdomen and pelvis was performed following the standard protocol without IV contrast. RADIATION DOSE REDUCTION: This exam was performed according to the departmental dose-optimization program which includes automated exposure control, adjustment of the mA and/or kV according to patient size and/or use of iterative reconstruction technique. COMPARISON:  CT angio chest 12/17/2022, CT abdomen pelvis 12/13/2022 FINDINGS: Lower chest: Partially visualized  central venous catheter terminating within the right atrium. Right lower lobe atelectasis. Right base pulmonary nodule measuring 9 x 8 mm. Please see separately dictated CT angiography chest 12/17/2022. Hepatobiliary: Enlarged liver measuring up to 26 cm. Stable multiple hepatic lesions that are heterogeneous and centrally hypodense. The largest measures 9.9 x 9.4 cm. No gallstones, gallbladder wall thickening, or pericholecystic fluid. No biliary dilatation. Pancreas: No focal lesion. Normal pancreatic contour. No surrounding inflammatory changes. No main pancreatic ductal dilatation. Spleen: Normal in size without focal abnormality. Adrenals/Urinary Tract: No adrenal nodule bilaterally. No nephrolithiasis and no hydronephrosis. Excretion of previously administered intravenous contrast noted from bilateral kidneys. No ureterolithiasis or hydroureter. The urinary bladder is unremarkable. Stomach/Bowel: Sigmoid resection. Stomach is within normal limits. No evidence of bowel wall thickening or dilatation. Stool throughout the colon. Appendix appears normal. Vascular/Lymphatic: No abdominal aorta or iliac aneurysm. Mild atherosclerotic plaque of the aorta and its branches. No abdominal, pelvic, or inguinal lymphadenopathy. Reproductive: Prostate is unremarkable. Other: No intraperitoneal free fluid. No intraperitoneal free gas. No organized fluid collection. Musculoskeletal: No abdominal wall hernia or abnormality. Healed anterior abdominal incision. No suspicious lytic or blastic osseous lesions. No acute displaced fracture. IMPRESSION: 1. Stable multiple hepatic lesions consistent with known metastases. 2. Stool throughout the colon-correlate for constipation. 3. Right base pulmonary nodule measuring 9 x 8 mm. Please see separately dictated CT angiography chest 12/17/2022. 4. Sigmoid resection Electronically Signed   By: Tish Frederickson M.D.   On: 12/17/2022 23:44   DG Chest 1 View  Result Date:  12/17/2022 CLINICAL DATA:  [id number] Chest pain [id number] EXAM: CHEST  1 VIEW COMPARISON:  CT chest 11/16/2022. chest x-ray 03/09/2016 FINDINGS: Accessed right chest wall Port-A-Cath with tip overlying the expected region of the superior cavoatrial junction. The heart and mediastinal contours are unchanged. Bibasilar atelectasis. No focal consolidation. No pulmonary edema. No pleural effusion. No pneumothorax. No acute osseous abnormality. IMPRESSION: No active disease. Electronically Signed   By: Tish Frederickson M.D.   On: 12/17/2022 22:16   IR IMAGING GUIDED PORT INSERTION  Result Date: 12/14/2022 CLINICAL DATA:  Metastatic colon cancer to THE LIVER, ACCESS FOR CHEMOTHERAPY EXAM: RIGHT INTERNAL JUGULAR SINGLE LUMEN POWER PORT CATHETER INSERTION Date:  12/14/2022 12/14/2022 1:11 pm Radiologist:  Judie Petit. Ruel Favors, MD Guidance:  Ultrasound and fluoroscopic MEDICATIONS: 1% lidocaine local with epinephrine ANESTHESIA/SEDATION: Versed 3.0 mg IV; Fentanyl 150 mcg IV; Moderate Sedation Time:  26 minutes The patient was continuously monitored during the procedure by the interventional radiology nurse under my direct supervision. FLUOROSCOPY: One minutes, 0 seconds (6 mGy) COMPLICATIONS: None immediate. CONTRAST:  None. PROCEDURE: Informed consent was obtained from the patient following explanation of the procedure, risks, benefits and alternatives. The patient understands,  agrees and consents for the procedure. All questions were addressed. A time out was performed. Maximal barrier sterile technique utilized including caps, mask, sterile gowns, sterile gloves, large sterile drape, hand hygiene, and 2% chlorhexidine scrub. Under sterile conditions and local anesthesia, right internal jugular micropuncture venous access was performed. Access was performed with ultrasound. Images were obtained for documentation of the patent right internal jugular vein. A guide wire was inserted followed by a transitional dilator. This allowed  insertion of a guide wire and catheter into the IVC. Measurements were obtained from the SVC / RA junction back to the right IJ venotomy site. In the right infraclavicular chest, a subcutaneous pocket was created over the second anterior rib. This was done under sterile conditions and local anesthesia. 1% lidocaine with epinephrine was utilized for this. A 2.5 cm incision was made in the skin. Blunt dissection was performed to create a subcutaneous pocket over the right pectoralis major muscle. The pocket was flushed with saline vigorously. There was adequate hemostasis. The port catheter was assembled and checked for leakage. The port catheter was secured in the pocket with two retention sutures. The tubing was tunneled subcutaneously to the right venotomy site and inserted into the SVC/RA junction through a valved peel-away sheath. Position was confirmed with fluoroscopy. Images were obtained for documentation. The patient tolerated the procedure well. No immediate complications. Incisions were closed in a two layer fashion with 4 - 0 Vicryl suture. Dermabond was applied to the skin. The port catheter was accessed, blood was aspirated followed by saline and heparin flushes. Needle was removed. A dry sterile dressing was applied. IMPRESSION: Ultrasound and fluoroscopically guided right internal jugular single lumen power port catheter insertion. Tip in the SVC/RA junction. Catheter ready for use. Electronically Signed   By: Judie Petit.  Shick M.D.   On: 12/14/2022 13:14   CT ABDOMEN PELVIS W CONTRAST  Result Date: 12/13/2022 CLINICAL DATA:  Abdominal pain, metastatic cancer EXAM: CT ABDOMEN AND PELVIS WITH CONTRAST TECHNIQUE: Multidetector CT imaging of the abdomen and pelvis was performed using the standard protocol following bolus administration of intravenous contrast. RADIATION DOSE REDUCTION: This exam was performed according to the departmental dose-optimization program which includes automated exposure control,  adjustment of the mA and/or kV according to patient size and/or use of iterative reconstruction technique. CONTRAST:  75mL OMNIPAQUE IOHEXOL 350 MG/ML SOLN COMPARISON:  11/16/2022 FINDINGS: Lower chest: Scarring/atelectasis in the right lower lobe. Hepatobiliary: Multifocal hepatic metastases, progressive. Index 9.3 cm lesion in the left hepatic lobe measures 9.3 cm (series 3/image 31), previously 7.6 cm. Index inferior right hepatic lobe metastasis measures 9.6 cm (series 3/image 40), previously 8.6 cm. Gallbladder is unremarkable. No intrahepatic or extrahepatic dilatation. Pancreas: Within normal limits. Spleen: Within normal limits. Adrenals/Urinary Tract: Adrenal glands are within normal limits. Kidneys with normal limits.  No hydronephrosis. Mildly thick-walled bladder, although underdistended. Stomach/Bowel: Stomach is within normal limits. No evidence of bowel obstruction. Normal appendix (series 3/image 72). Interval left hemicolectomy with suture line in the lower pelvis (series 3/image 37). Prior sigmoid colonic mass is no longer visualized. Vascular/Lymphatic: No evidence of abdominal aortic aneurysm. Atherosclerotic calcifications of the abdominal aorta and branch vessels. No suspicious abdominopelvic lymphadenopathy. Specifically, the nodal soft tissue along the sigmoid mesocolon on the prior is no longer visualized, likely surgically absent. Reproductive: Prostate is unremarkable. Other: No abdominopelvic ascites. Musculoskeletal: Visualized osseous structures are within normal limits. IMPRESSION: Interval left hemicolectomy with resection of prior sigmoid colon mass and suspected resection of adjacent mesenteric nodal metastasis.  Multifocal hepatic metastases, progressive. Electronically Signed   By: Charline Bills M.D.   On: 12/13/2022 02:49    Labs: BNP (last 3 results) No results for input(s): "BNP" in the last 8760 hours. Basic Metabolic Panel: Recent Labs  Lab 12/18/22 0230  12/18/22 0615 12/18/22 0900 12/18/22 2129 12/19/22 0429 12/19/22 1458  NA 137 135 137 140 135  --   K 5.5* 5.0 4.9 4.0 4.4  --   CL 103 103 106 104 105  --   CO2 23 19* 21* 22 25  --   GLUCOSE 122* 111* 106* 113* 95  --   BUN 27* 27* 24* 29* 29*  --   CREATININE 1.27* 1.11 1.10 1.35* 1.25*  --   CALCIUM 10.1 9.5 10.0 10.0 9.3  --   MG  --   --   --  2.4  --  2.3  PHOS  --   --   --   --   --  4.5   Liver Function Tests: Recent Labs  Lab 12/16/22 0820 12/17/22 2350  AST 109* 109*  ALT 63* 77*  ALKPHOS 475* 415*  BILITOT 0.5 0.5  PROT 7.2 7.0  ALBUMIN 3.4* 3.0*   No results for input(s): "LIPASE", "AMYLASE" in the last 168 hours. No results for input(s): "AMMONIA" in the last 168 hours. CBC: Recent Labs  Lab 12/16/22 0820 12/17/22 2150 12/17/22 2355 12/18/22 0615 12/19/22 0429  WBC 13.1* 19.6*  --  19.5* 14.4*  NEUTROABS 9.2*  --   --   --   --   HGB 12.5* 14.6 13.3 11.6* 10.9*  HCT 37.5* 47.7 39.0 35.2* 32.7*  MCV 87.4 94.6  --  88.0 87.9  PLT 324 255  --  309 256   Cardiac Enzymes: Recent Labs  Lab 12/18/22 0615  CKTOTAL 139  Anemia work up No results for input(s): "VITAMINB12", "FOLATE", "FERRITIN", "TIBC", "IRON", "RETICCTPCT" in the last 72 hours. Urinalysis    Component Value Date/Time   COLORURINE YELLOW 12/17/2022 2257   APPEARANCEUR CLEAR 12/17/2022 2257   LABSPEC 1.016 12/17/2022 2257   PHURINE 5.0 12/17/2022 2257   GLUCOSEU NEGATIVE 12/17/2022 2257   HGBUR NEGATIVE 12/17/2022 2257   BILIRUBINUR NEGATIVE 12/17/2022 2257   BILIRUBINUR small 03/19/2016 1317   KETONESUR NEGATIVE 12/17/2022 2257   PROTEINUR 30 (A) 12/17/2022 2257   UROBILINOGEN 0.2 03/19/2016 1317   UROBILINOGEN 1.0 08/10/2014 2326   NITRITE NEGATIVE 12/17/2022 2257   LEUKOCYTESUR NEGATIVE 12/17/2022 2257   Sepsis Labs Recent Labs  Lab 12/16/22 0820 12/17/22 2150 12/18/22 0615 12/19/22 0429  WBC 13.1* 19.6* 19.5* 14.4*   Microbiology No results found for this or any  previous visit (from the past 240 hour(s)). Time coordinating discharge: 25 minutes  SIGNED: Lanae Boast, MD  [facility] 12/20/2022, 2:15 PM  If 7PM-7AM, please contact night-coverage [email or web address]

## 2022-12-20 NOTE — Progress Notes (Signed)
Nsg Discharge Note  Admit Date:  12/17/2022 Discharge date: 12/20/2022   Richard Bowman to be D/C'd Home per MD order.  AVS completed. Patient/caregiver able to verbalize understanding.  Discharge Medication: Allergies as of 12/20/2022   No Known Allergies      Medication List     TAKE these medications    acetaminophen 500 MG tablet Commonly known as: TYLENOL Take 2 tablets (1,000 mg total) by mouth every 6 (six) hours as needed for mild pain.   dexamethasone 4 MG tablet Commonly known as: DECADRON Take 2 tablets (8 mg total) by mouth daily for 3 days, starting the day after chemotherapy. Take with food.   docusate sodium 100 MG capsule Commonly known as: COLACE Take 1 capsule (100 mg total) by mouth 2 (two) times daily.   lactose free nutrition Liqd Take 237 mLs by mouth daily as needed (Supplement).   feeding supplement Liqd Take 237 mLs by mouth 2 (two) times daily between meals.   fluconazole 100 MG tablet Commonly known as: DIFLUCAN Take 1 tablet (100 mg total) by mouth daily.   hydrocortisone 5 MG tablet Commonly known as: CORTEF Take 1 tablet (5 mg total) by mouth 3 (three) times daily.   ibuprofen 200 MG tablet Commonly known as: ADVIL Take 3 tablets (600 mg total) by mouth every 6 (six) hours as needed for moderate pain.   lactulose 10 GM/15ML solution Commonly known as: CHRONULAC Take 15 mLs (10 g total) by mouth 3 (three) times daily.   lidocaine-prilocaine cream Commonly known as: EMLA Apply 1 Application topically as needed. What changed: reasons to take this   methocarbamol 500 MG tablet Commonly known as: ROBAXIN Take 1 tablet (500 mg total) by mouth every 6 (six) hours as needed for muscle spasms.   nicotine 21 mg/24hr patch Commonly known as: NICODERM CQ - dosed in mg/24 hours Place 1 patch (21 mg total) onto the skin daily.   nystatin 100000 UNIT/ML suspension Commonly known as: MYCOSTATIN Take 5 mLs (500,000 Units total) by mouth 4  (four) times daily.   ondansetron 8 MG tablet Commonly known as: Zofran Take 1 tablet (8 mg total) by mouth every 8 (eight) hours as needed for nausea or vomiting. Start on the third day after cisplatin.   Oxycodone HCl 10 MG Tabs Take 0.5-1 tablets (5-10 mg total) by mouth every 6 (six) hours as needed. What changed:  how much to take reasons to take this   polyethylene glycol 17 g packet Commonly known as: MIRALAX / GLYCOLAX Take 17 g by mouth daily as needed for mild constipation.   prochlorperazine 10 MG tablet Commonly known as: COMPAZINE Take 1 tablet (10 mg total) by mouth every 6 (six) hours as needed for nausea or vomiting.   SM Anti-Diarrheal 2 MG tablet Generic drug: loperamide Take 2 tabs by mouth with first loose stool, then 1 tablet with each additional loose stool as needed. Do not exceed 8 tablets in a 24-hour period.        Discharge Assessment: Vitals:   12/20/22 0500 12/20/22 0814  BP: 114/72 122/85  Pulse: 77 81  Resp: 17   Temp: 98.1 F (36.7 C) 98.3 F (36.8 C)  SpO2: 98% 96%   Skin clean, dry and intact without evidence of skin break down, no evidence of skin tears noted. IV catheter discontinued intact. Site without signs and symptoms of complications - no redness or edema noted at insertion site, patient denies c/o pain - only slight  tenderness at site.  Dressing with slight pressure applied.  D/c Instructions-Education: Discharge instructions given to patient/family with verbalized understanding. D/c education completed with patient/family including follow up instructions, medication list, d/c activities limitations if indicated, with other d/c instructions as indicated by MD - patient able to verbalize understanding, all questions fully answered. Patient instructed to return to ED, call 911, or call MD for any changes in condition.  Patient escorted via WC, and D/C home via private auto.  Kizzie Bane, RN 12/20/2022 12:43 PM

## 2022-12-21 ENCOUNTER — Inpatient Hospital Stay: Payer: Medicaid Other

## 2022-12-21 ENCOUNTER — Other Ambulatory Visit: Payer: Self-pay

## 2022-12-21 ENCOUNTER — Encounter: Payer: Self-pay | Admitting: Hematology & Oncology

## 2022-12-21 ENCOUNTER — Other Ambulatory Visit (HOSPITAL_BASED_OUTPATIENT_CLINIC_OR_DEPARTMENT_OTHER): Payer: Self-pay

## 2022-12-21 VITALS — BP 115/69 | HR 81 | Temp 97.6°F | Resp 17

## 2022-12-21 DIAGNOSIS — R109 Unspecified abdominal pain: Secondary | ICD-10-CM | POA: Diagnosis not present

## 2022-12-21 DIAGNOSIS — C787 Secondary malignant neoplasm of liver and intrahepatic bile duct: Secondary | ICD-10-CM | POA: Diagnosis present

## 2022-12-21 DIAGNOSIS — K5900 Constipation, unspecified: Secondary | ICD-10-CM | POA: Diagnosis not present

## 2022-12-21 DIAGNOSIS — C189 Malignant neoplasm of colon, unspecified: Secondary | ICD-10-CM

## 2022-12-21 DIAGNOSIS — Z5111 Encounter for antineoplastic chemotherapy: Secondary | ICD-10-CM | POA: Diagnosis present

## 2022-12-21 DIAGNOSIS — C187 Malignant neoplasm of sigmoid colon: Secondary | ICD-10-CM | POA: Diagnosis present

## 2022-12-21 DIAGNOSIS — Z5189 Encounter for other specified aftercare: Secondary | ICD-10-CM | POA: Diagnosis not present

## 2022-12-21 MED ORDER — PEGFILGRASTIM-CBQV 6 MG/0.6ML ~~LOC~~ SOSY
6.0000 mg | PREFILLED_SYRINGE | Freq: Once | SUBCUTANEOUS | Status: AC
  Administered 2022-12-21: 6 mg via SUBCUTANEOUS
  Filled 2022-12-21: qty 0.6

## 2022-12-21 MED ORDER — LIDOCAINE-PRILOCAINE 2.5-2.5 % EX CREA
1.0000 | TOPICAL_CREAM | CUTANEOUS | 0 refills | Status: DC | PRN
  Filled 2022-12-21: qty 30, 30d supply, fill #0

## 2022-12-21 NOTE — Patient Instructions (Signed)

## 2022-12-22 ENCOUNTER — Other Ambulatory Visit: Payer: Self-pay

## 2022-12-23 ENCOUNTER — Inpatient Hospital Stay: Payer: Medicaid Other

## 2022-12-23 ENCOUNTER — Inpatient Hospital Stay: Payer: Medicaid Other | Admitting: Hematology & Oncology

## 2022-12-23 ENCOUNTER — Encounter: Payer: Self-pay | Admitting: Hematology & Oncology

## 2022-12-23 ENCOUNTER — Other Ambulatory Visit: Payer: Self-pay | Admitting: *Deleted

## 2022-12-23 ENCOUNTER — Other Ambulatory Visit (HOSPITAL_BASED_OUTPATIENT_CLINIC_OR_DEPARTMENT_OTHER): Payer: Self-pay

## 2022-12-23 ENCOUNTER — Telehealth: Payer: Self-pay | Admitting: *Deleted

## 2022-12-23 MED ORDER — OXYCODONE HCL 5 MG PO TABS
5.0000 mg | ORAL_TABLET | Freq: Four times a day (QID) | ORAL | 0 refills | Status: DC | PRN
  Filled 2022-12-23: qty 60, 15d supply, fill #0

## 2022-12-23 NOTE — Telephone Encounter (Signed)
Patient called with complaint of constipation. Patient stated,"my friend is a Engineer, civil (consulting) and she recommends Movantik for constipation." Per Dr. Myna Hidalgo, he needs to take Colace and Miralax twice a day, and increase oral intake of fluids. He verbalized understanding.

## 2022-12-24 ENCOUNTER — Telehealth: Payer: Self-pay | Admitting: *Deleted

## 2022-12-24 NOTE — Telephone Encounter (Signed)
Returned patient's phone call regarding several questions. Per Dr. Myna Hidalgo, decrease Hydrocortiosne 10 mg tablets to twice a day, take Benadryl 25 mg at bedtime for itching, stop using Ibuprofen and a heating pad  on the right lateral side, Hydrocodone has been decreased to 5 mg. He verbalized understanding.

## 2022-12-25 ENCOUNTER — Inpatient Hospital Stay: Payer: Medicaid Other

## 2022-12-28 ENCOUNTER — Encounter: Payer: Self-pay | Admitting: Genetic Counselor

## 2022-12-28 DIAGNOSIS — Z1379 Encounter for other screening for genetic and chromosomal anomalies: Secondary | ICD-10-CM | POA: Insufficient documentation

## 2022-12-29 ENCOUNTER — Encounter: Payer: Self-pay | Admitting: *Deleted

## 2022-12-29 ENCOUNTER — Inpatient Hospital Stay: Payer: Medicaid Other

## 2022-12-29 ENCOUNTER — Encounter: Payer: Self-pay | Admitting: Hematology & Oncology

## 2022-12-29 ENCOUNTER — Inpatient Hospital Stay (HOSPITAL_BASED_OUTPATIENT_CLINIC_OR_DEPARTMENT_OTHER): Payer: Medicaid Other | Admitting: Hematology & Oncology

## 2022-12-29 VITALS — BP 112/57 | HR 79 | Temp 98.7°F | Resp 18 | Ht 70.0 in | Wt 180.0 lb

## 2022-12-29 DIAGNOSIS — C189 Malignant neoplasm of colon, unspecified: Secondary | ICD-10-CM

## 2022-12-29 DIAGNOSIS — Z5111 Encounter for antineoplastic chemotherapy: Secondary | ICD-10-CM | POA: Diagnosis not present

## 2022-12-29 DIAGNOSIS — C787 Secondary malignant neoplasm of liver and intrahepatic bile duct: Secondary | ICD-10-CM

## 2022-12-29 LAB — CBC WITH DIFFERENTIAL (CANCER CENTER ONLY)
Abs Immature Granulocytes: 6.41 10*3/uL — ABNORMAL HIGH (ref 0.00–0.07)
Basophils Absolute: 0.1 10*3/uL (ref 0.0–0.1)
Basophils Relative: 0 %
Eosinophils Absolute: 1 10*3/uL — ABNORMAL HIGH (ref 0.0–0.5)
Eosinophils Relative: 3 %
HCT: 38.6 % — ABNORMAL LOW (ref 39.0–52.0)
Hemoglobin: 12.7 g/dL — ABNORMAL LOW (ref 13.0–17.0)
Immature Granulocytes: 19 %
Lymphocytes Relative: 14 %
Lymphs Abs: 4.6 10*3/uL — ABNORMAL HIGH (ref 0.7–4.0)
MCH: 28.7 pg (ref 26.0–34.0)
MCHC: 32.9 g/dL (ref 30.0–36.0)
MCV: 87.3 fL (ref 80.0–100.0)
Monocytes Absolute: 2.2 10*3/uL — ABNORMAL HIGH (ref 0.1–1.0)
Monocytes Relative: 6 %
Neutro Abs: 19.4 10*3/uL — ABNORMAL HIGH (ref 1.7–7.7)
Neutrophils Relative %: 58 %
Platelet Count: 421 10*3/uL — ABNORMAL HIGH (ref 150–400)
RBC: 4.42 MIL/uL (ref 4.22–5.81)
RDW: 13.1 % (ref 11.5–15.5)
Smear Review: NORMAL
WBC Count: 33.6 10*3/uL — ABNORMAL HIGH (ref 4.0–10.5)
nRBC: 0.1 % (ref 0.0–0.2)

## 2022-12-29 LAB — CMP (CANCER CENTER ONLY)
ALT: 49 U/L — ABNORMAL HIGH (ref 0–44)
AST: 49 U/L — ABNORMAL HIGH (ref 15–41)
Albumin: 3.5 g/dL (ref 3.5–5.0)
Alkaline Phosphatase: 427 U/L — ABNORMAL HIGH (ref 38–126)
Anion gap: 9 (ref 5–15)
BUN: 19 mg/dL (ref 6–20)
CO2: 31 mmol/L (ref 22–32)
Calcium: 9.7 mg/dL (ref 8.9–10.3)
Chloride: 100 mmol/L (ref 98–111)
Creatinine: 1.22 mg/dL (ref 0.61–1.24)
GFR, Estimated: >60 mL/min (ref >60)
Glucose, Bld: 103 mg/dL — ABNORMAL HIGH (ref 70–99)
Potassium: 3.9 mmol/L (ref 3.5–5.1)
Sodium: 140 mmol/L (ref 135–145)
Total Bilirubin: 0.3 mg/dL (ref 0.3–1.2)
Total Protein: 6.6 g/dL (ref 6.5–8.1)

## 2022-12-29 LAB — LACTATE DEHYDROGENASE: LDH: 364 U/L — ABNORMAL HIGH (ref 98–192)

## 2022-12-29 LAB — CEA (IN HOUSE-CHCC): CEA (CHCC-In House): 285.09 ng/mL — ABNORMAL HIGH (ref 0.00–5.00)

## 2022-12-29 NOTE — Progress Notes (Signed)
Hematology and Oncology Follow Up Visit  Richard Bowman [id number] 05-Nov-1975 47 y.o. 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  He has had his first cycle of chemotherapy.  He did okay with this.  He did have some problems with constipation.  We have him on some laxative right now.  He states going little bit better.    He says he is eating a little bit better.  He ate fairly well over the weekend.  He is on some steroids to try to help him eat a little bit better.  His last CEA level was 267.  This was before he had treatment.  We will have to see how it goes today with this.  He has had no cough or shortness of breath.  Pain wise, he seems to be doing fairly well with this.  There is been no leg swelling.  He has had no bleeding.  There is been no fever.  Overall, his performance status is ECOG 1.    Medications:  Current Outpatient Medications:    dexamethasone (DECADRON) 4 MG tablet, Take 2 tablets (8 mg total) by mouth daily for 3 days, starting the day after chemotherapy. Take with food., Disp: 30 tablet, Rfl: 1   ondansetron (ZOFRAN) 8 MG tablet, Take 1 tablet (8 mg total) by mouth every 8 (eight) hours as needed for nausea or vomiting. Start on the third day after cisplatin., Disp: 30 tablet, Rfl: 1   oxyCODONE (OXY IR/ROXICODONE) 5 MG immediate release tablet, Take 1 tablet (5 mg total) by mouth every 6 (six) hours as needed for severe pain., Disp: 60 tablet, Rfl: 0   acetaminophen (TYLENOL) 500 MG tablet, Take 2 tablets (1,000 mg total) by mouth every 6 (six) hours as needed for mild pain. (Patient not taking: Reported on 12/18/2022), Disp: 30 tablet, Rfl: 0   docusate sodium (COLACE) 100 MG capsule, Take 1 capsule (100 mg total) by mouth 2 (two) times daily.  (Patient not taking: Reported on 12/18/2022), Disp: 10 capsule, Rfl: 0   feeding supplement (ENSURE ENLIVE / ENSURE PLUS) LIQD, Take 237 mLs by mouth 2 (two) times daily between meals. (Patient not taking: Reported on 12/01/2022), Disp: 237 mL, Rfl: 12   fluconazole (DIFLUCAN) 100 MG tablet, Take 1 tablet (100 mg total) by mouth daily. (Patient not taking: Reported on 12/18/2022), Disp: 30 tablet, Rfl: 1   hydrocortisone (CORTEF) 5 MG tablet, Take 1 tablet (5 mg total) by mouth 3 (three) times daily. (Patient not taking: Reported on 12/29/2022), Disp: 90 tablet, Rfl: 0   ibuprofen (ADVIL) 200 MG tablet, Take 3 tablets (600 mg total) by mouth every 6 (six) hours as needed for moderate pain. (Patient not taking: Reported on 12/29/2022), Disp: , Rfl:    lactose free nutrition (BOOST) LIQD, Take 237 mLs by mouth daily as needed (Supplement). (Patient not taking: Reported on 12/29/2022), Disp: , Rfl:    lactulose (CHRONULAC) 10 GM/15ML solution, Take 15 mLs (10 g total) by mouth 3 (three) times daily. (Patient not taking: Reported on 12/18/2022), Disp: 236 mL, Rfl: 0   lidocaine-prilocaine (EMLA) cream, Apply 1 Application topically as needed. (Patient not taking: Reported on 12/29/2022), Disp: 30 g, Rfl: 0   loperamide (IMODIUM A-D) 2 MG tablet, Take 2 tabs by  mouth with first loose stool, then 1 tablet with each additional loose stool as needed. Do not exceed 8 tablets in a 24-hour period. (Patient not taking: Reported on 12/18/2022), Disp: 100 tablet, Rfl: 3   methocarbamol (ROBAXIN) 500 MG tablet, Take 1 tablet (500 mg total) by mouth every 6 (six) hours as needed for muscle spasms. (Patient not taking: Reported on 12/18/2022), Disp: 30 tablet, Rfl: 0   nicotine (NICODERM CQ - DOSED IN MG/24 HOURS) 21 mg/24hr patch, Place 1 patch (21 mg total) onto the skin daily. (Patient not taking: Reported on 12/01/2022), Disp: 30 patch, Rfl: 1   nystatin (MYCOSTATIN) 100000 UNIT/ML suspension, Take 5 mLs (500,000 Units total) by  mouth 4 (four) times daily. (Patient not taking: Reported on 12/29/2022), Disp: 473 mL, Rfl: 1   polyethylene glycol (MIRALAX / GLYCOLAX) 17 g packet, Take 17 g by mouth daily as needed for mild constipation. (Patient not taking: Reported on 12/29/2022), Disp: 14 each, Rfl: 0   prochlorperazine (COMPAZINE) 10 MG tablet, Take 1 tablet (10 mg total) by mouth every 6 (six) hours as needed for nausea or vomiting. (Patient not taking: Reported on 12/29/2022), Disp: 30 tablet, Rfl: 1  Allergies: No Known Allergies  Past Medical History, Surgical history, Social history, and Family History were reviewed and updated.  Review of Systems: Review of Systems  Constitutional: Negative.   HENT:  Negative.    Eyes: Negative.   Respiratory: Negative.    Cardiovascular: Negative.   Gastrointestinal:  Positive for abdominal pain and constipation.  Endocrine: Negative.   Genitourinary: Negative.    Musculoskeletal: Negative.   Skin: Negative.   Neurological: Negative.   Hematological: Negative.   Psychiatric/Behavioral: Negative.      Physical Exam:  height is 5\' 10"  (1.778 m) and weight is 180 lb (81.6 kg). His oral temperature is 98.7 F (37.1 C). His blood pressure is 112/57 (abnormal) and his pulse is 79. His respiration is 18 and oxygen saturation is 99%.   Wt Readings from Last 3 Encounters:  12/29/22 180 lb (81.6 kg)  12/20/22 188 lb 15 oz (85.7 kg)  12/14/22 185 lb (83.9 kg)    Physical Exam Vitals reviewed.  HENT:     Head: Normocephalic and atraumatic.  Eyes:     Pupils: Pupils are equal, round, and reactive to light.  Cardiovascular:     Rate and Rhythm: Normal rate and regular rhythm.     Heart sounds: Normal heart sounds.  Pulmonary:     Effort: Pulmonary effort is normal.     Breath sounds: Normal breath sounds.  Abdominal:     General: Bowel sounds are normal.     Palpations: Abdomen is soft.     Comments: Abdominal exam shows a well-healed laparotomy scar.  He has no fluid  wave.  There is no guarding or rebound tenderness.  He does have hepatomegaly with his liver edge about 7-8 cm below the right costal margin.  There is no splenomegaly.  Musculoskeletal:        General: No tenderness or deformity. Normal range of motion.     Cervical back: Normal range of motion.  Lymphadenopathy:     Cervical: No cervical adenopathy.  Skin:    General: Skin is warm and dry.     Findings: No erythema or rash.  Neurological:     Mental Status: He is alert and oriented to person, place, and time.  Psychiatric:        Behavior: Behavior normal.  Thought Content: Thought content normal.        Judgment: Judgment normal.      Lab Results  Component Value Date   WBC 33.6 (H) 12/29/2022   HGB 12.7 (L) 12/29/2022   HCT 38.6 (L) 12/29/2022   MCV 87.3 12/29/2022   PLT 421 (H) 12/29/2022     Chemistry      Component Value Date/Time   NA 140 12/29/2022 0805   K 3.9 12/29/2022 0805   CL 100 12/29/2022 0805   CO2 31 12/29/2022 0805   BUN 19 12/29/2022 0805   CREATININE 1.22 12/29/2022 0805      Component Value Date/Time   CALCIUM 9.7 12/29/2022 0805   ALKPHOS 427 (H) 12/29/2022 0805   AST 49 (H) 12/29/2022 0805   ALT 49 (H) 12/29/2022 0805   BILITOT 0.3 12/29/2022 0805       Impression and Plan: Richard Bowman is a very nice 47 year old white male.  He has really no past medical history.  He came to the hospital with abdominal pain.  He was found to have metastatic colon cancer.  We will go ahead with a second cycle of chemotherapy.  He is K-ras mutated.  As such, we cannot use EGFR inhibitors.  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  As such, we might be able to utilize anti-HER2 therapy in the future.  I still would just go with FOLFOXIRI.  I do not think we need to add Avastin right now.  Hopefully, we will see his CEA level coming down.  We will plan to get him back in 2 weeks for his third cycle of treatment.    Josph Macho, MD 4/30/20248:40  AM

## 2022-12-29 NOTE — Progress Notes (Unsigned)
Patient tolerated cycle one fairly well, but still has some issues with pain and constipation. Patient wasn't aware he was getting treated today, so he will come back tomorrow for cycle two.   Oncology Nurse Navigator Documentation     12/29/2022    8:45 AM  Oncology Nurse Navigator Flowsheets  Navigator Follow Up Date: 12/30/2022  Navigator Follow Up Reason: Chemotherapy  Navigator Location [facility]  Navigator Encounter Type Treatment;Appt/Treatment Plan Review  Patient Visit Type MedOnc  Treatment Phase Active Tx  Barriers/Navigation Needs Coordination of Care;Education;Financial Toxicity;Unemployed  Interventions Psycho-Social Support  Acuity Level 3-Moderate Needs (3-4 Barriers Identified)  Time Spent with Patient 15

## 2022-12-30 ENCOUNTER — Encounter: Payer: Self-pay | Admitting: Hematology & Oncology

## 2022-12-30 ENCOUNTER — Encounter: Payer: Self-pay | Admitting: *Deleted

## 2022-12-30 ENCOUNTER — Inpatient Hospital Stay: Payer: Medicaid Other | Attending: Hematology & Oncology

## 2022-12-30 ENCOUNTER — Inpatient Hospital Stay: Payer: Medicaid Other | Admitting: Licensed Clinical Social Worker

## 2022-12-30 VITALS — BP 103/58 | HR 74 | Temp 98.6°F | Resp 16

## 2022-12-30 DIAGNOSIS — R112 Nausea with vomiting, unspecified: Secondary | ICD-10-CM | POA: Diagnosis not present

## 2022-12-30 DIAGNOSIS — C787 Secondary malignant neoplasm of liver and intrahepatic bile duct: Secondary | ICD-10-CM

## 2022-12-30 DIAGNOSIS — C187 Malignant neoplasm of sigmoid colon: Secondary | ICD-10-CM | POA: Insufficient documentation

## 2022-12-30 DIAGNOSIS — Z5189 Encounter for other specified aftercare: Secondary | ICD-10-CM | POA: Insufficient documentation

## 2022-12-30 DIAGNOSIS — Z5111 Encounter for antineoplastic chemotherapy: Secondary | ICD-10-CM | POA: Diagnosis present

## 2022-12-30 DIAGNOSIS — Z79899 Other long term (current) drug therapy: Secondary | ICD-10-CM | POA: Insufficient documentation

## 2022-12-30 MED ORDER — DEXTROSE 5 % IV SOLN: Freq: Once | INTRAVENOUS | Status: AC

## 2022-12-30 MED ORDER — PALONOSETRON HCL INJECTION 0.25 MG/5ML
0.2500 mg | Freq: Once | INTRAVENOUS | Status: AC
  Administered 2022-12-30: 0.25 mg via INTRAVENOUS
  Filled 2022-12-30: qty 5

## 2022-12-30 MED ORDER — OXALIPLATIN CHEMO INJECTION 100 MG/20ML
85.0000 mg/m2 | Freq: Once | INTRAVENOUS | Status: AC
  Administered 2022-12-30: 175 mg via INTRAVENOUS
  Filled 2022-12-30: qty 35

## 2022-12-30 MED ORDER — SODIUM CHLORIDE 0.9% FLUSH: 10.0000 mL | INTRAVENOUS | Status: DC | PRN

## 2022-12-30 MED ORDER — LEUCOVORIN CALCIUM INJECTION 350 MG
400.0000 mg/m2 | Freq: Once | INTRAVENOUS | Status: AC
  Administered 2022-12-30: 824 mg via INTRAVENOUS
  Filled 2022-12-30: qty 41.2

## 2022-12-30 MED ORDER — SODIUM CHLORIDE 0.9 % IV SOLN
2400.0000 mg/m2 | INTRAVENOUS | Status: DC
  Administered 2022-12-30: 5000 mg via INTRAVENOUS
  Filled 2022-12-30: qty 100

## 2022-12-30 MED ORDER — ATROPINE SULFATE 1 MG/ML IV SOLN
0.5000 mg | Freq: Once | INTRAVENOUS | Status: AC | PRN
  Administered 2022-12-30: 0.5 mg via INTRAVENOUS
  Filled 2022-12-30: qty 1

## 2022-12-30 MED ORDER — SODIUM CHLORIDE 0.9 % IV SOLN
150.0000 mg | Freq: Once | INTRAVENOUS | Status: AC
  Administered 2022-12-30: 150 mg via INTRAVENOUS
  Filled 2022-12-30: qty 150

## 2022-12-30 MED ORDER — SODIUM CHLORIDE 0.9 % IV SOLN
10.0000 mg | Freq: Once | INTRAVENOUS | Status: AC
  Administered 2022-12-30: 10 mg via INTRAVENOUS
  Filled 2022-12-30: qty 10

## 2022-12-30 MED ORDER — HEPARIN SOD (PORK) LOCK FLUSH 100 UNIT/ML IV SOLN: 500.0000 [IU] | Freq: Once | INTRAVENOUS | Status: DC | PRN

## 2022-12-30 MED ORDER — LORAZEPAM 2 MG/ML IJ SOLN
0.5000 mg | Freq: Once | INTRAMUSCULAR | Status: AC
  Administered 2022-12-30: 0.5 mg via INTRAVENOUS
  Filled 2022-12-30: qty 1

## 2022-12-30 MED ORDER — SODIUM CHLORIDE 0.9 % IV SOLN
165.0000 mg/m2 | Freq: Once | INTRAVENOUS | Status: AC
  Administered 2022-12-30: 340 mg via INTRAVENOUS
  Filled 2022-12-30: qty 15

## 2022-12-30 NOTE — Addendum Note (Signed)
Addended by: Arlan Organ R on: 12/30/2022 09:30 AM   Modules accepted: Orders

## 2022-12-30 NOTE — Progress Notes (Unsigned)
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  While MUTYH is a colon cancer gene, you need two mutations in it to be affected with a hereditary syndrome.  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 Reviewed information with him. He was appreciative of the information.  Oncology Nurse Navigator Documentation     12/30/2022    9:15 AM  Oncology Nurse Navigator Flowsheets  Navigator Follow Up Date: 01/12/2023  Navigator Follow Up Reason: Follow-up Appointment;Chemotherapy  Navigator Location [facility]  Navigator Encounter Type Treatment  Patient Visit Type MedOnc  Treatment Phase Active Tx  Barriers/Navigation Needs Coordination of Care;Education;Financial Toxicity;Unemployed  Education Other  Interventions Education  Acuity Level 3-Moderate Needs (3-4 Barriers Identified)  Coordination of Care Other  Education Method Verbal;Written  Time Spent with Patient 15

## 2022-12-30 NOTE — Progress Notes (Signed)
Changing fluorouracil pump dose to 2400mg /m2 for today's and future doses per Dr. Gustavo Lah instructions.

## 2022-12-30 NOTE — Progress Notes (Signed)
[facility] CSW Progress Note  Visual merchandiser met with patient in infusion to assess needs.  Discussed his financial strain.  He stated he was approved for disability.  Provided him with a bag from the food pantry and made a referral to Mady Haagensen for the [employer].  He is currently staying with his sister, but this is temporary.  He said he may have an apartment in the future.  Provided active listening and supportive counseling.    Dorothey Baseman, LCSW Clinical Social Worker [facility]

## 2022-12-30 NOTE — Patient Instructions (Signed)
[facility]  Discharge Instructions: Thank you for choosing [facility] to provide your oncology and hematology care.   If you have a lab appointment with the Cancer Center, please go directly to the Cancer Center and check in at the registration area.  Wear comfortable clothing and clothing appropriate for easy access to any Portacath or PICC line.   We strive to give you quality time with your provider. You may need to reschedule your appointment if you arrive late (15 or more minutes).  Arriving late affects you and other patients whose appointments are after yours.  Also, if you miss three or more appointments without notifying the office, you may be dismissed from the clinic at the provider's discretion.      For prescription refill requests, have your pharmacy contact our office and allow 72 hours for refills to be completed.    Today you received the following chemotherapy and/or immunotherapy agents FOLFOXIRI      To help prevent nausea and vomiting after your treatment, we encourage you to take your nausea medication as directed.  BELOW ARE SYMPTOMS THAT SHOULD BE REPORTED IMMEDIATELY: *FEVER GREATER THAN 100.4 F (38 C) OR HIGHER *CHILLS OR SWEATING *NAUSEA AND VOMITING THAT IS NOT CONTROLLED WITH YOUR NAUSEA MEDICATION *UNUSUAL SHORTNESS OF BREATH *UNUSUAL BRUISING OR BLEEDING *URINARY PROBLEMS (pain or burning when urinating, or frequent urination) *BOWEL PROBLEMS (unusual diarrhea, constipation, pain near the anus) TENDERNESS IN MOUTH AND THROAT WITH OR WITHOUT PRESENCE OF ULCERS (sore throat, sores in mouth, or a toothache) UNUSUAL RASH, SWELLING OR PAIN  UNUSUAL VAGINAL DISCHARGE OR ITCHING   Items with * indicate a potential emergency and should be followed up as soon as possible or go to the Emergency Department if any problems should occur.  Please show the CHEMOTHERAPY ALERT CARD or IMMUNOTHERAPY ALERT CARD at  check-in to the Emergency Department and triage nurse. Should you have questions after your visit or need to cancel or reschedule your appointment, please contact [facility]  [phone] and follow the prompts.  Office hours are 8:00 a.m. to 4:30 p.m. Monday - Friday. Please note that voicemails left after 4:00 p.m. may not be returned until the following business day.  We are closed weekends and major holidays. You have access to a nurse at all times for urgent questions. Please call the main number to the clinic [phone] and follow the prompts.  For any non-urgent questions, you may also contact your provider using MyChart. We now offer e-Visits for anyone 83 and older to request care online for non-urgent symptoms. For details visit [email or web address].   Also download the MyChart app! Go to the app store, search "MyChart", open the app, select [facility], and log in with your MyChart username and password.

## 2022-12-31 ENCOUNTER — Telehealth: Payer: Self-pay | Admitting: *Deleted

## 2022-12-31 ENCOUNTER — Other Ambulatory Visit: Payer: Self-pay

## 2022-12-31 ENCOUNTER — Ambulatory Visit: Payer: Self-pay

## 2022-12-31 ENCOUNTER — Encounter: Payer: Self-pay | Admitting: Hematology & Oncology

## 2022-12-31 NOTE — Telephone Encounter (Signed)
Call placed to check on patient with no answer.  Message left to instruct pt to call back if N/V persists or with any further questions.

## 2022-12-31 NOTE — Telephone Encounter (Signed)
Call received from patient stating that he has been vomiting for 10 minutes and would like to know what to do.  Pt states that the nausea started at 0300 and that he did not take anything for nausea at that time.  He states that he took Oxycodone and Decadron this morning right before he vomited.  Pt instructed to take something for nausea as soon as he is nauseated and not to wait.  Anti-emetics reviewed with pt.  Teach back done.  Instructed pt that I would call back to check on him in a few hours.  Pt has no questions at this time.

## 2023-01-01 ENCOUNTER — Inpatient Hospital Stay: Payer: Medicaid Other

## 2023-01-01 VITALS — BP 99/69 | HR 60 | Temp 97.9°F | Resp 17

## 2023-01-01 DIAGNOSIS — Z5111 Encounter for antineoplastic chemotherapy: Secondary | ICD-10-CM | POA: Diagnosis not present

## 2023-01-01 DIAGNOSIS — C189 Malignant neoplasm of colon, unspecified: Secondary | ICD-10-CM

## 2023-01-01 MED ORDER — PEGFILGRASTIM-CBQV 6 MG/0.6ML ~~LOC~~ SOSY
6.0000 mg | PREFILLED_SYRINGE | Freq: Once | SUBCUTANEOUS | Status: AC
  Administered 2023-01-01: 6 mg via SUBCUTANEOUS
  Filled 2023-01-01: qty 0.6

## 2023-01-01 MED ORDER — SODIUM CHLORIDE 0.9% FLUSH
10.0000 mL | INTRAVENOUS | Status: DC | PRN
  Administered 2023-01-01: 10 mL

## 2023-01-01 MED ORDER — HEPARIN SOD (PORK) LOCK FLUSH 100 UNIT/ML IV SOLN
500.0000 [IU] | Freq: Once | INTRAVENOUS | Status: AC | PRN
  Administered 2023-01-01: 500 [IU]

## 2023-01-01 MED ORDER — LORAZEPAM 2 MG/ML IJ SOLN
1.0000 mg | Freq: Once | INTRAMUSCULAR | Status: AC
  Administered 2023-01-01: 1 mg via INTRAVENOUS
  Filled 2023-01-01: qty 1

## 2023-01-01 MED ORDER — SODIUM CHLORIDE 0.9 % IV SOLN: INTRAVENOUS | Status: DC

## 2023-01-01 MED ORDER — FAMOTIDINE IN NACL 20-0.9 MG/50ML-% IV SOLN
20.0000 mg | INTRAVENOUS | Status: AC
  Administered 2023-01-01 (×2): 20 mg via INTRAVENOUS
  Filled 2023-01-01: qty 50

## 2023-01-01 NOTE — Patient Instructions (Signed)
Lorazepam Injection What is this medication? LORAZEPAM (lor A ze pam) treats seizures. It is also used to treat anxiety, including before a procedure. It works by Child psychotherapist system slow down. It belongs to a group of medications called benzodiazepines. This medicine may be used for other purposes; ask your health care provider or pharmacist if you have questions. COMMON BRAND NAME(S): Ativan What should I tell my care team before I take this medication? They need to know if you have any of these conditions: Glaucoma Kidney disease Liver disease Lung or breathing disease, such as asthma or COPD Mental health condition Myasthenia gravis Sleep apnea Substance use disorder Suicidal thoughts, plans, or attempt by you or a family member An unusual or allergic reaction to lorazepam, other medications, foods, dyes, or preservatives Pregnant or trying to get pregnant Breast-feeding How should I use this medication? This medication is for injection into a muscle or into a vein. It is given in a hospital or clinic. Talk to your care team about the use of this medication in children. Special care may be needed. Overdosage: If you think you have taken too much of this medicine contact a poison control center or emergency room at once. NOTE: This medicine is only for you. Do not share this medicine with others. What if I miss a dose? This does not apply. What may interact with this medication? Do not take this medication with any of the following: Calcium, Magnesium, Potassium, Sodium oxybates Sodium oxybate This medication may also interact with the following: Alcohol Certain antihistamines Certain medications for depression, such as amitriptyline or trazodone Certain medications for seizures, such as phenobarbital or primidone Clozapine Divalproex sodium Estrogen and progestin hormones Haloperidol Loxapine MAOIs, such as Marplan, Nardil, and Parnate Medications that cause  drowsiness before a procedure, such as propofol Medications that help you fall asleep Medications that relax muscles Opioids for pain or cough Phenothiazines, such as chlorpromazine, prochlorperazine, thioridazine Probenecid Scopolamine Valproate Valproic acid This list may not describe all possible interactions. Give your health care provider a list of all the medicines, herbs, non-prescription drugs, or dietary supplements you use. Also tell them if you smoke, drink alcohol, or use illegal drugs. Some items may interact with your medicine. What should I watch for while using this medication? Visit your care team for regular checks on your progress. Tell your care team if your symptoms do not start to get better or if they get worse. This medication may affect your coordination, reaction time, or judgment. Do not drive or operate machinery until you know how this medication affects you. Sit up or stand slowly to reduce the risk of dizzy or fainting spells. Drinking alcohol with this medication can increase the risk of these side effects. If you take other medications that also cause drowsiness such as other opioid pain medications, benzodiazepines, or other medications for sleep, you may have more side effects. Give your care team a list of all medications you use. They will tell you how much medication to take. Do not take more medication than directed. Call emergency services if you have problems breathing or are unusually tired or sleepy. If you or your family notice any changes in your behavior, such as new or worsening depression, thoughts of harming yourself, anxiety, other unusual or disturbing thoughts, or memory loss, call your care team right away. This medication has a risk of abuse and dependence. Your care team will check you for this while you take this medication. Long term  use of this medication may cause your brain and body to depend on it. This can happen even when used as directed  by your care team. You and your care team will work together to determine how long you will need to take this medication. If your care team wants you to stop this medication, the dose will be slowly lowered over time to reduce the risk of side effects. Talk to your care team if you wish to become pregnant or think you might be pregnant. Talk to your care team before breastfeeding. Changes to your treatment plan may be needed. What side effects may I notice from receiving this medication? Side effects that you should report to your care team as soon as possible: Allergic reactions--skin rash, itching, hives, swelling of the face, lips, tongue, or throat CNS depression--slow or shallow breathing, shortness of breath, feeling faint, dizziness, confusion, difficulty staying awake Thoughts of suicide or self-harm, worsening mood, feelings of depression Side effects that usually do not require medical attention (report to your care team if they continue or are bothersome): Dizziness Drowsiness Headache Nausea Vomiting This list may not describe all possible side effects. Call your doctor for medical advice about side effects. You may report side effects to FDA at 1-800-FDA-1088. Where should I keep my medication? This medication will be given to you in a hospital or clinic. You will not be given this medication to take home. NOTE: This sheet is a summary. It may not cover all possible information. If you have questions about this medicine, talk to your doctor, pharmacist, or health care provider.  2023 Elsevier/Gold Standard (2007-10-08 00:00:00) Famotidine Solution for Injection What is this medication? FAMOTIDINE (fa MOE ti deen) treats stomach ulcers, reflux disease, or other conditions that cause too much stomach acid. It works by reducing the amount of acid in the stomach. This medicine may be used for other purposes; ask your health care provider or pharmacist if you have questions. COMMON  BRAND NAME(S): Pepcid What should I tell my care team before I take this medication? They need to know if you have any of these conditions: Kidney or liver disease An unusual or allergic reaction to famotidine, other medications, foods, dyes, or preservatives Pregnant or trying to get pregnant Breast-feeding How should I use this medication? This medication is for infusion into a vein. It is given in a hospital or clinic setting. Talk to your care team regarding the use of this medication in children. Special care may be needed. Overdosage: If you think you have taken too much of this medicine contact a poison control center or emergency room at once. NOTE: This medicine is only for you. Do not share this medicine with others. What if I miss a dose? This does not apply. What may interact with this medication? Delavirdine Itraconazole Ketoconazole This list may not describe all possible interactions. Give your health care provider a list of all the medicines, herbs, non-prescription drugs, or dietary supplements you use. Also tell them if you smoke, drink alcohol, or use illegal drugs. Some items may interact with your medicine. What should I watch for while using this medication? Tell your doctor or health care professional if your condition does not start to get better or gets worse. Do not take with aspirin, ibuprofen, or other antiinflammatory medications. These can aggravate your condition. Do not smoke cigarettes or drink alcohol. These increase irritation in your stomach and can increase the time it will take for ulcers to heal. Cigarettes  and alcohol can also worsen acid reflux or heartburn. If you get black, tarry stools or vomit up what looks like coffee grounds, call your doctor or health care professional at once. You may have a bleeding ulcer. This medication may cause a decrease in vitamin B12. Make sure that you get enough vitamin B12 while you are taking this medication.  Discuss the foods you eat and the vitamins you take with your care team. What side effects may I notice from receiving this medication? Side effects that you should report to your care team as soon as possible: Allergic reactions--skin rash, itching, hives, swelling of the face, lips, tongue, or throat Confusion Hallucinations Side effects that usually do not require medical attention (report to your care team if they continue or are bothersome): Constipation Diarrhea Dizziness Headache This list may not describe all possible side effects. Call your doctor for medical advice about side effects. You may report side effects to FDA at 1-800-FDA-1088. Where should I keep my medication? This medication is given in a hospital or clinic. You will not be given this medication to store at home. NOTE: This sheet is a summary. It may not cover all possible information. If you have questions about this medicine, talk to your doctor, pharmacist, or health care provider.  2023 Elsevier/Gold Standard (2020-08-20 00:00:00) Dehydration, Adult Dehydration is a condition in which there is not enough water or other fluids in the body. This happens when a person loses more fluids than they take in. Important organs cannot work right without the right amount of fluids. Any loss of fluids from the body can cause dehydration. Dehydration can be mild, worse, or very bad. It should be treated right away to keep it from getting very bad. What are the causes? Conditions that cause loss of water in the body. They include: Watery poop (diarrhea). Vomiting. Sweating a lot. Fever. Infection. Peeing (urinating) a lot. Not drinking enough fluids. Certain medicines, such as medicines that take extra fluid out of the body (diuretics). Lack of safe drinking water. Not being able to get enough water and food. What increases the risk? Having a long-term (chronic) illness that has not been treated the right way, such  as: Diabetes. Heart disease. Kidney disease. Being 32 years of age or older. Having a disability. Living in a place that is high above the ground or sea (high in altitude). The thinner, drier air causes more fluid loss. Doing exercises that put stress on your body for a long time. Being active when in hot places. What are the signs or symptoms? Symptoms of dehydration depend on how bad it is. Mild or worse dehydration Thirst. Dry lips or dry mouth. Feeling dizzy or light-headed. Muscle cramps. Passing little pee or dark pee. Pee may be the color of tea. Headache. Very bad dehydration Changes in skin. Skin may: Be cold to the touch (clammy). Be blotchy or pale. Not go back to normal right after you pinch it and let it go. Little or no tears, pee, or sweat. Fast breathing. Low blood pressure. Weak pulse. Pulse that is more than 100 beats a minute when you are sitting still. Other changes, such as: Feeling very thirsty. Eyes that look hollow (sunken). Cold hands and feet. Being confused. Being very tired (lethargic) or having trouble waking from sleep. Losing weight. Loss of consciousness. How is this treated? Treatment for this condition depends on how bad your dehydration is. Treatment should start right away. Do not wait until your condition  gets very bad. Very bad dehydration is an emergency. You will need to go to a hospital. Mild or worse dehydration can be treated at home. You may be asked to: Drink more fluids. Drink an oral rehydration solution (ORS). This drink gives you the right amount of fluids, salts, and minerals (electrolytes). Very bad dehydration can be treated: With fluids through an IV tube. By correcting low levels of electrolytes in the body. By treating the problem that caused your dehydration. Follow these instructions at home: Oral rehydration solution If told by your doctor, drink an ORS: Make an ORS. Use instructions on the package. Start by  drinking small amounts, about  cup (120 mL) every 5-10 minutes. Slowly drink more until you have had the amount that your doctor said to have.  Eating and drinking  Drink enough clear fluid to keep your pee pale yellow. If you were told to drink an ORS, finish the ORS first. Then, start slowly drinking other clear fluids. Drink fluids such as: Water. Do not drink only water. Doing that can make the salt (sodium) level in your body get too low. Water from ice chips you suck on. Fruit juice that you have added water to (diluted). Low-calorie sports drinks. Eat foods that have the right amounts of salts and minerals, such as bananas, oranges, potatoes, tomatoes, or spinach. Do not drink alcohol. Avoid drinks that have caffeine or sugar. These include:: High-calorie sports drinks. Fruit juice that you did not add water to. Soda. Coffee or energy drinks. Avoid foods that are greasy or have a lot of fat or sugar. General instructions Take over-the-counter and prescription medicines only as told by your doctor. Do not take sodium tablets. Doing that can make the salt level in your body get too high. Return to your normal activities as told by your doctor. Ask your doctor what activities are safe for you. Keep all follow-up visits. Your doctor may check and change your treatment. Contact a doctor if: You have pain in your belly (abdomen) and the pain: Gets worse. Stays in one place. You have a rash. You have a stiff neck. You get angry or annoyed more easily than normal. You are more tired or have a harder time waking than normal. You feel weak or dizzy. You feel very thirsty. Get help right away if: You have any symptoms of very bad dehydration. You vomit every time you eat or drink. Your vomiting gets worse, does not go away, or you vomit blood or green stuff. You are getting treatment, but symptoms are getting worse. You have a fever. You have a very bad headache. You  have: Diarrhea that gets worse or does not go away. Blood in your poop (stool). This may cause poop to look black and tarry. No pee in 6-8 hours. Only a small amount of pee in 6-8 hours, and the pee is very dark. You have trouble breathing. These symptoms may be an emergency. Get help right away. Call 911. Do not wait to see if the symptoms will go away. Do not drive yourself to the hospital. This information is not intended to replace advice given to you by your health care provider. Make sure you discuss any questions you have with your health care provider. Document Revised: 03/16/2022 Document Reviewed: 03/16/2022 Elsevier Patient Education  2023 [employer].

## 2023-01-05 ENCOUNTER — Telehealth: Payer: Self-pay

## 2023-01-05 ENCOUNTER — Encounter: Payer: Self-pay | Admitting: Hematology & Oncology

## 2023-01-05 ENCOUNTER — Other Ambulatory Visit: Payer: Self-pay

## 2023-01-05 ENCOUNTER — Other Ambulatory Visit (HOSPITAL_BASED_OUTPATIENT_CLINIC_OR_DEPARTMENT_OTHER): Payer: Self-pay

## 2023-01-05 DIAGNOSIS — C189 Malignant neoplasm of colon, unspecified: Secondary | ICD-10-CM

## 2023-01-05 MED ORDER — ONDANSETRON HCL 8 MG PO TABS
8.0000 mg | ORAL_TABLET | Freq: Three times a day (TID) | ORAL | 1 refills | Status: DC | PRN
Start: 2023-01-05 — End: 2023-01-29
  Filled 2023-01-05: qty 30, 10d supply, fill #0

## 2023-01-05 NOTE — Telephone Encounter (Signed)
Received phone call from patient asking for refill on his zofran medication. Pt stating he did have one episode of vomiting this morning and he threw up his pills. Pt stated his nausea is getting better but he is still struggling with it. This RN reviewed his two anti nausea medications of compazine and zofran. Pt aware to alternate those medications as needed for his nausea. Pt instructed to keep a log of when he takes his medications and has vomiting so that this office can help him understand how to best take the medicine to optimize their effect. Pt instructed to keep crackers by bedside to eat when he wakes up and to have something on his stomach before he takes his medication.  Pt stated he is almost out of pain medications. Pt educated that taking pain medication on an empty stomach can also cause nausea. This RN reviewed side effects of narcotics and taking zofran daily including constipation. This RN reviewed ways to help with constipation including drinking plenty of water, taking stool softeners, and taking Miralax along with a stool softener. Pt verbalized understanding and had no further questions. Zofran refill sent to patient pharmacy.

## 2023-01-05 NOTE — Progress Notes (Signed)
Called to speak with [name] regarding the [employer], left him a message asking that he call me back.

## 2023-01-06 ENCOUNTER — Inpatient Hospital Stay: Payer: Medicaid Other

## 2023-01-06 ENCOUNTER — Inpatient Hospital Stay: Payer: Medicaid Other | Admitting: Hematology & Oncology

## 2023-01-07 ENCOUNTER — Other Ambulatory Visit: Payer: Self-pay | Admitting: *Deleted

## 2023-01-07 ENCOUNTER — Inpatient Hospital Stay: Payer: Medicaid Other

## 2023-01-07 ENCOUNTER — Other Ambulatory Visit (HOSPITAL_BASED_OUTPATIENT_CLINIC_OR_DEPARTMENT_OTHER): Payer: Self-pay

## 2023-01-07 ENCOUNTER — Encounter: Payer: Self-pay | Admitting: Hematology & Oncology

## 2023-01-07 MED ORDER — OXYCODONE HCL 5 MG PO TABS
5.0000 mg | ORAL_TABLET | Freq: Four times a day (QID) | ORAL | 0 refills | Status: DC | PRN
  Filled 2023-01-07: qty 60, 15d supply, fill #0

## 2023-01-07 NOTE — Progress Notes (Signed)
[facility] CSW Progress Note  Clinical Child psychotherapist contacted patient by phone to assess needs.  Patient reported increased vomiting after last treatment.  His medical team is aware.  He requested the remaining two of his [employer]  He said he will come to [facility] and retrieve them today.  He also asked for Mady Haagensen' contact information regarding the [employer].    Dorothey Baseman, LCSW Clinical Social Worker [facility]

## 2023-01-08 ENCOUNTER — Encounter: Payer: Self-pay | Admitting: Hematology & Oncology

## 2023-01-08 ENCOUNTER — Inpatient Hospital Stay: Payer: Medicaid Other

## 2023-01-11 ENCOUNTER — Inpatient Hospital Stay: Payer: Medicaid Other

## 2023-01-11 NOTE — Progress Notes (Signed)
[facility] CSW Progress Note  Clinical Child psychotherapist returned patient's phone call to assess needs.  He stated he had his disability paperwork and needed to get it to CSW.  Informed him to give it to [facility] at the front desk of the [employer] at [facility] tomorrow during treatment and he agreed.  CSW contacted [employer] who will forward it to [employer], [employer].  Patient expressed no other needs.    Dorothey Baseman, LCSW Clinical Social Worker [facility]

## 2023-01-12 ENCOUNTER — Other Ambulatory Visit: Payer: Self-pay | Admitting: Oncology

## 2023-01-12 ENCOUNTER — Other Ambulatory Visit (HOSPITAL_BASED_OUTPATIENT_CLINIC_OR_DEPARTMENT_OTHER): Payer: Self-pay

## 2023-01-12 ENCOUNTER — Inpatient Hospital Stay (HOSPITAL_BASED_OUTPATIENT_CLINIC_OR_DEPARTMENT_OTHER): Payer: Medicaid Other | Admitting: Hematology & Oncology

## 2023-01-12 ENCOUNTER — Inpatient Hospital Stay: Payer: Medicaid Other

## 2023-01-12 ENCOUNTER — Encounter: Payer: Self-pay | Admitting: *Deleted

## 2023-01-12 ENCOUNTER — Encounter: Payer: Self-pay | Admitting: Hematology & Oncology

## 2023-01-12 ENCOUNTER — Ambulatory Visit: Payer: Self-pay | Admitting: Dietician

## 2023-01-12 VITALS — BP 138/86 | HR 79 | Temp 97.9°F | Resp 20 | Wt 173.0 lb

## 2023-01-12 VITALS — BP 120/64 | HR 62

## 2023-01-12 DIAGNOSIS — C787 Secondary malignant neoplasm of liver and intrahepatic bile duct: Secondary | ICD-10-CM | POA: Diagnosis not present

## 2023-01-12 DIAGNOSIS — C189 Malignant neoplasm of colon, unspecified: Secondary | ICD-10-CM | POA: Diagnosis not present

## 2023-01-12 DIAGNOSIS — Z5111 Encounter for antineoplastic chemotherapy: Secondary | ICD-10-CM | POA: Diagnosis not present

## 2023-01-12 LAB — CBC WITH DIFFERENTIAL (CANCER CENTER ONLY)
Abs Immature Granulocytes: 0.77 10*3/uL — ABNORMAL HIGH (ref 0.00–0.07)
Basophils Absolute: 0.2 10*3/uL — ABNORMAL HIGH (ref 0.0–0.1)
Basophils Relative: 1 %
Eosinophils Absolute: 0.2 10*3/uL (ref 0.0–0.5)
Eosinophils Relative: 1 %
HCT: 42.6 % (ref 39.0–52.0)
Hemoglobin: 13.9 g/dL (ref 13.0–17.0)
Immature Granulocytes: 6 %
Lymphocytes Relative: 25 %
Lymphs Abs: 3.2 10*3/uL (ref 0.7–4.0)
MCH: 28.2 pg (ref 26.0–34.0)
MCHC: 32.6 g/dL (ref 30.0–36.0)
MCV: 86.4 fL (ref 80.0–100.0)
Monocytes Absolute: 1.7 10*3/uL — ABNORMAL HIGH (ref 0.1–1.0)
Monocytes Relative: 14 %
Neutro Abs: 6.8 10*3/uL (ref 1.7–7.7)
Neutrophils Relative %: 53 %
Platelet Count: 337 10*3/uL (ref 150–400)
RBC: 4.93 MIL/uL (ref 4.22–5.81)
RDW: 13.9 % (ref 11.5–15.5)
Smear Review: NORMAL
WBC Count: 12.7 10*3/uL — ABNORMAL HIGH (ref 4.0–10.5)
nRBC: 0 % (ref 0.0–0.2)

## 2023-01-12 LAB — URIC ACID: Uric Acid, Serum: 8.4 mg/dL (ref 3.7–8.6)

## 2023-01-12 LAB — CEA (IN HOUSE-CHCC): CEA (CHCC-In House): 189.73 ng/mL — ABNORMAL HIGH (ref 0.00–5.00)

## 2023-01-12 LAB — CMP (CANCER CENTER ONLY)
ALT: 43 U/L (ref 0–44)
AST: 38 U/L (ref 15–41)
Albumin: 3.9 g/dL (ref 3.5–5.0)
Alkaline Phosphatase: 344 U/L — ABNORMAL HIGH (ref 38–126)
Anion gap: 8 (ref 5–15)
BUN: 13 mg/dL (ref 6–20)
CO2: 30 mmol/L (ref 22–32)
Calcium: 9.9 mg/dL (ref 8.9–10.3)
Chloride: 101 mmol/L (ref 98–111)
Creatinine: 1.06 mg/dL (ref 0.61–1.24)
GFR, Estimated: >60 mL/min (ref >60)
Glucose, Bld: 97 mg/dL (ref 70–99)
Potassium: 3.7 mmol/L (ref 3.5–5.1)
Sodium: 139 mmol/L (ref 135–145)
Total Bilirubin: 0.3 mg/dL (ref 0.3–1.2)
Total Protein: 7.1 g/dL (ref 6.5–8.1)

## 2023-01-12 LAB — LACTATE DEHYDROGENASE: LDH: 179 U/L (ref 98–192)

## 2023-01-12 MED ORDER — SODIUM CHLORIDE 0.9 % IV SOLN
165.0000 mg/m2 | Freq: Once | INTRAVENOUS | Status: AC
  Administered 2023-01-12: 340 mg via INTRAVENOUS
  Filled 2023-01-12: qty 15

## 2023-01-12 MED ORDER — SODIUM CHLORIDE 0.9 % IV SOLN
10.0000 mg | Freq: Once | INTRAVENOUS | Status: AC
  Administered 2023-01-12: 10 mg via INTRAVENOUS
  Filled 2023-01-12: qty 10

## 2023-01-12 MED ORDER — PALONOSETRON HCL INJECTION 0.25 MG/5ML
0.2500 mg | Freq: Once | INTRAVENOUS | Status: AC
  Administered 2023-01-12: 0.25 mg via INTRAVENOUS
  Filled 2023-01-12: qty 5

## 2023-01-12 MED ORDER — PROCHLORPERAZINE MALEATE 10 MG PO TABS
10.0000 mg | ORAL_TABLET | Freq: Four times a day (QID) | ORAL | 3 refills | Status: DC | PRN
Start: 2023-01-12 — End: 2023-01-27
  Filled 2023-01-12: qty 30, 8d supply, fill #0

## 2023-01-12 MED ORDER — LORAZEPAM 0.5 MG PO TABS
0.5000 mg | ORAL_TABLET | Freq: Four times a day (QID) | ORAL | 0 refills | Status: DC | PRN
  Filled 2023-01-12: qty 60, 15d supply, fill #0

## 2023-01-12 MED ORDER — OXALIPLATIN CHEMO INJECTION 100 MG/20ML
85.0000 mg/m2 | Freq: Once | INTRAVENOUS | Status: AC
  Administered 2023-01-12: 175 mg via INTRAVENOUS
  Filled 2023-01-12: qty 35

## 2023-01-12 MED ORDER — LEUCOVORIN CALCIUM INJECTION 350 MG
400.0000 mg/m2 | Freq: Once | INTRAVENOUS | Status: AC
  Administered 2023-01-12: 824 mg via INTRAVENOUS
  Filled 2023-01-12: qty 41.2

## 2023-01-12 MED ORDER — SODIUM CHLORIDE 0.9 % IV SOLN
150.0000 mg | Freq: Once | INTRAVENOUS | Status: AC
  Administered 2023-01-12: 150 mg via INTRAVENOUS
  Filled 2023-01-12: qty 150

## 2023-01-12 MED ORDER — SODIUM CHLORIDE 0.9 % IV SOLN
2400.0000 mg/m2 | INTRAVENOUS | Status: DC
  Administered 2023-01-12: 5000 mg via INTRAVENOUS
  Filled 2023-01-12: qty 100

## 2023-01-12 MED ORDER — DEXTROSE 5 % IV SOLN: Freq: Once | INTRAVENOUS | Status: AC

## 2023-01-12 MED ORDER — PANTOPRAZOLE SODIUM 40 MG PO TBEC
40.0000 mg | DELAYED_RELEASE_TABLET | Freq: Two times a day (BID) | ORAL | 6 refills | Status: DC
  Filled 2023-01-12: qty 60, 30d supply, fill #0
  Filled 2023-05-06: qty 60, 30d supply, fill #1

## 2023-01-12 MED ORDER — METHOCARBAMOL 500 MG PO TABS
500.0000 mg | ORAL_TABLET | Freq: Four times a day (QID) | ORAL | 0 refills | Status: DC | PRN
  Filled 2023-01-12: qty 30, 8d supply, fill #0

## 2023-01-12 NOTE — Progress Notes (Signed)
Enrolled Nik[employer] into the [employer].

## 2023-01-12 NOTE — Progress Notes (Signed)
Patient is doing well with the except of nausea. Dr Myna Hidalgo made changes to his symptom management plan. Scans will be due after his next cycle.   Oncology Nurse Navigator Documentation     01/12/2023    1:30 PM  Oncology Nurse Navigator Flowsheets  Navigator Follow Up Date: 01/26/2023  Navigator Follow Up Reason: Follow-up Appointment;Chemotherapy  Navigator Location [facility]  Navigator Encounter Type Treatment;Appt/Treatment Plan Review  Patient Visit Type MedOnc  Treatment Phase Active Tx  Barriers/Navigation Needs Coordination of Care;Education;Financial Toxicity;Unemployed  Interventions Psycho-Social Support  Acuity Level 3-Moderate Needs (3-4 Barriers Identified)  Time Spent with Patient 15

## 2023-01-12 NOTE — Progress Notes (Signed)
Hematology and Oncology Follow Up Visit  Richard Bowman [id number] 10-Jul-1976 47 y.o. 01/12/2023   Principle Diagnosis:  Metastatic adenocarcinoma of the colon-liver metastasis -- pMMR/MSI-low -- (+) KRAS/ ERBB2(+)  Current Therapy:   FOLFOXIRI - s/p cycle #2  - start on  -- 12/09/2022     Interim History:  Richard Bowman is back for follow-up.  He has some little bit difficulty with chemotherapy.  He is having some more nausea.  I will call in some Ativan for him.  Hopefully the Ativan will help with some of this nausea.  He has had no bleeding.  He has had no fever.  He has had no rashes.  He has had no tingling in the hands or feet.  His last CEA level was stable at 285.  Hopefully will start seeing this improve.  He has lost a little bit of weight.  Hopefully, we get the nausea and a little bit better control so that he can eat a little bit more.  I will put him on some Protonix.  He will take this twice a day.  Hopefully this may also help with some of the nausea.  He is still having some pain.  He really is not too keen about taking pain medication.  Overall, I would have to say that his performance status is probably ECOG 1.    Medications:  Current Outpatient Medications:    acetaminophen (TYLENOL) 500 MG tablet, Take 2 tablets (1,000 mg total) by mouth every 6 (six) hours as needed for mild pain., Disp: 30 tablet, Rfl: 0   dexamethasone (DECADRON) 4 MG tablet, Take 2 tablets (8 mg total) by mouth daily for 3 days, starting the day after chemotherapy. Take with food., Disp: 30 tablet, Rfl: 1   docusate sodium (COLACE) 100 MG capsule, Take 1 capsule (100 mg total) by mouth 2 (two) times daily. (Patient taking differently: Take 100 mg by mouth daily.), Disp: 10 capsule, Rfl: 0   feeding supplement (ENSURE ENLIVE / ENSURE PLUS) LIQD, Take 237 mLs by mouth 2 (two) times daily between meals., Disp: 237 mL, Rfl: 12   hydrocortisone (CORTEF) 5 MG tablet, Take 1 tablet (5 mg total) by  mouth 3 (three) times daily., Disp: 90 tablet, Rfl: 0   lactose free nutrition (BOOST) LIQD, Take 237 mLs by mouth daily as needed (Supplement)., Disp: , Rfl:    lidocaine-prilocaine (EMLA) cream, Apply 1 Application topically as needed., Disp: 30 g, Rfl: 0   loratadine (CLARITIN) 10 MG tablet, Take 10 mg by mouth. Takes day of injection-Udenyca, post chemotherapy., Disp: , Rfl:    nystatin (MYCOSTATIN) 100000 UNIT/ML suspension, Take 5 mLs (500,000 Units total) by mouth 4 (four) times daily., Disp: 473 mL, Rfl: 1   ondansetron (ZOFRAN) 8 MG tablet, Take 1 tablet (8 mg total) by mouth every 8 (eight) hours as needed for nausea or vomiting. Start on the third day after cisplatin., Disp: 30 tablet, Rfl: 1   oxyCODONE (OXY IR/ROXICODONE) 5 MG immediate release tablet, Take 1 tablet (5 mg total) by mouth every 6 (six) hours as needed for severe pain., Disp: 60 tablet, Rfl: 0   polyethylene glycol (MIRALAX / GLYCOLAX) 17 g packet, Take 17 g by mouth daily as needed for mild constipation., Disp: 14 each, Rfl: 0   prochlorperazine (COMPAZINE) 10 MG tablet, Take 1 tablet (10 mg total) by mouth every 6 (six) hours as needed for nausea or vomiting., Disp: 30 tablet, Rfl: 1   fluconazole (DIFLUCAN) 100  MG tablet, Take 1 tablet (100 mg total) by mouth daily. (Patient not taking: Reported on 12/18/2022), Disp: 30 tablet, Rfl: 1   lactulose (CHRONULAC) 10 GM/15ML solution, Take 15 mLs (10 g total) by mouth 3 (three) times daily. (Patient not taking: Reported on 12/18/2022), Disp: 236 mL, Rfl: 0   loperamide (IMODIUM A-D) 2 MG tablet, Take 2 tabs by mouth with first loose stool, then 1 tablet with each additional loose stool as needed. Do not exceed 8 tablets in a 24-hour period. (Patient not taking: Reported on 12/18/2022), Disp: 100 tablet, Rfl: 3   methocarbamol (ROBAXIN) 500 MG tablet, Take 1 tablet (500 mg total) by mouth every 6 (six) hours as needed for muscle spasms. (Patient not taking: Reported on 12/18/2022),  Disp: 30 tablet, Rfl: 0   nicotine (NICODERM CQ - DOSED IN MG/24 HOURS) 21 mg/24hr patch, Place 1 patch (21 mg total) onto the skin daily. (Patient not taking: Reported on 12/01/2022), Disp: 30 patch, Rfl: 1  Allergies: No Known Allergies  Past Medical History, Surgical history, Social history, and Family History were reviewed and updated.  Review of Systems: Review of Systems  Constitutional: Negative.   HENT:  Negative.    Eyes: Negative.   Respiratory: Negative.    Cardiovascular: Negative.   Gastrointestinal:  Positive for abdominal pain and constipation.  Endocrine: Negative.   Genitourinary: Negative.    Musculoskeletal: Negative.   Skin: Negative.   Neurological: Negative.   Hematological: Negative.   Psychiatric/Behavioral: Negative.      Physical Exam:  weight is 173 lb (78.5 kg). His oral temperature is 97.9 F (36.6 C). His blood pressure is 138/86 and his pulse is 79. His respiration is 20 and oxygen saturation is 98%.   Wt Readings from Last 3 Encounters:  01/12/23 173 lb (78.5 kg)  12/29/22 180 lb (81.6 kg)  12/20/22 188 lb 15 oz (85.7 kg)    Physical Exam Vitals reviewed.  HENT:     Head: Normocephalic and atraumatic.  Eyes:     Pupils: Pupils are equal, round, and reactive to light.  Cardiovascular:     Rate and Rhythm: Normal rate and regular rhythm.     Heart sounds: Normal heart sounds.  Pulmonary:     Effort: Pulmonary effort is normal.     Breath sounds: Normal breath sounds.  Abdominal:     General: Bowel sounds are normal.     Palpations: Abdomen is soft.     Comments: Abdominal exam shows a well-healed laparotomy scar.  He has no fluid wave.  There is no guarding or rebound tenderness.  He does have hepatomegaly with his liver edge about 7-8 cm below the right costal margin.  There is no splenomegaly.  Musculoskeletal:        General: No tenderness or deformity. Normal range of motion.     Cervical back: Normal range of motion.   Lymphadenopathy:     Cervical: No cervical adenopathy.  Skin:    General: Skin is warm and dry.     Findings: No erythema or rash.  Neurological:     Mental Status: He is alert and oriented to person, place, and time.  Psychiatric:        Behavior: Behavior normal.        Thought Content: Thought content normal.        Judgment: Judgment normal.     Lab Results  Component Value Date   WBC 12.7 (H) 01/12/2023   HGB 13.9 01/12/2023  HCT 42.6 01/12/2023   MCV 86.4 01/12/2023   PLT 337 01/12/2023     Chemistry      Component Value Date/Time   NA 139 01/12/2023 0930   K 3.7 01/12/2023 0930   CL 101 01/12/2023 0930   CO2 30 01/12/2023 0930   BUN 13 01/12/2023 0930   CREATININE 1.06 01/12/2023 0930      Component Value Date/Time   CALCIUM 9.9 01/12/2023 0930   ALKPHOS 344 (H) 01/12/2023 0930   AST 38 01/12/2023 0930   ALT 43 01/12/2023 0930   BILITOT 0.3 01/12/2023 0930       Impression and Plan: Richard Bowman is a very nice 47 year old white male.  He has really no past medical history.  He came to the hospital with abdominal pain.  He was found to have metastatic colon cancer.  We will go ahead with a second cycle of chemotherapy.  He is K-ras mutated.  As such, we cannot use EGFR inhibitors.  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  As such, we might be able to utilize anti-HER2 therapy in the future.  I still would just go with FOLFOXIRI.  I do not think we need to add Avastin right now.  Hopefully, we will see his CEA level coming down.  We will plan to get him back in 2 weeks for his 4th cycle of treatment.  After the fourth cycle, then we will plan for another scan.    Josph Macho, MD 5/14/202410:14 AM

## 2023-01-12 NOTE — Patient Instructions (Signed)

## 2023-01-12 NOTE — Patient Instructions (Signed)
[facility]  Discharge Instructions: Thank you for choosing [facility] to provide your oncology and hematology care.   If you have a lab appointment with the Cancer Center, please go directly to the Cancer Center and check in at the registration area.  Wear comfortable clothing and clothing appropriate for easy access to any Portacath or PICC line.   We strive to give you quality time with your provider. You may need to reschedule your appointment if you arrive late (15 or more minutes).  Arriving late affects you and other patients whose appointments are after yours.  Also, if you miss three or more appointments without notifying the office, you may be dismissed from the clinic at the provider's discretion.      For prescription refill requests, have your pharmacy contact our office and allow 72 hours for refills to be completed.    Today you received the following chemotherapy and/or immunotherapy agents Oxaliplatin/Irinotecan/Leucovorin/5 Fu      To help prevent nausea and vomiting after your treatment, we encourage you to take your nausea medication as directed.  BELOW ARE SYMPTOMS THAT SHOULD BE REPORTED IMMEDIATELY: *FEVER GREATER THAN 100.4 F (38 C) OR HIGHER *CHILLS OR SWEATING *NAUSEA AND VOMITING THAT IS NOT CONTROLLED WITH YOUR NAUSEA MEDICATION *UNUSUAL SHORTNESS OF BREATH *UNUSUAL BRUISING OR BLEEDING *URINARY PROBLEMS (pain or burning when urinating, or frequent urination) *BOWEL PROBLEMS (unusual diarrhea, constipation, pain near the anus) TENDERNESS IN MOUTH AND THROAT WITH OR WITHOUT PRESENCE OF ULCERS (sore throat, sores in mouth, or a toothache) UNUSUAL RASH, SWELLING OR PAIN  UNUSUAL VAGINAL DISCHARGE OR ITCHING   Items with * indicate a potential emergency and should be followed up as soon as possible or go to the Emergency Department if any problems should occur.  Please show the CHEMOTHERAPY ALERT CARD or  IMMUNOTHERAPY ALERT CARD at check-in to the Emergency Department and triage nurse. Should you have questions after your visit or need to cancel or reschedule your appointment, please contact [facility]  [phone] and follow the prompts.  Office hours are 8:00 a.m. to 4:30 p.m. Monday - Friday. Please note that voicemails left after 4:00 p.m. may not be returned until the following business day.  We are closed weekends and major holidays. You have access to a nurse at all times for urgent questions. Please call the main number to the clinic [phone] and follow the prompts.  For any non-urgent questions, you may also contact your provider using MyChart. We now offer e-Visits for anyone 94 and older to request care online for non-urgent symptoms. For details visit [email or web address].   Also download the MyChart app! Go to the app store, search "MyChart", open the app, select [facility], and log in with your MyChart username and password.

## 2023-01-12 NOTE — Progress Notes (Signed)
Nutrition Follow Up:   Called patient on mobile for remote follow up on continued weight loss. Patient reports he's been getting sick everyday since last treatment, using all his anti-emetics but in the morning before he can get his pills in he has trouble keeping foods down.  He tried to start day with a few bites of eggs or cereal with juice or milk.  He also report severe abdominal  pain since last Saturday soon as he takes first bite of food stomach hurts with sharp pain (8 out of ten).  Doesn't have any pain with liquids and he states his been keeping up well with his hydration.  He states he is going to try whey protein powders or soups.  Drinking water and juices (orange juices) usually one ONS daily.   Medications: using Zofran, compazine and decadron    Labs: Reviewed 01/12/23     Anthropometrics:  Has lost  19# (9.89%) past month   Height: 69" Weight:  01/12/23  173# 12/29/22  180# 12/20/22  188# 12/01/22  192# 11/15/22  202# 10/28/22  223# UBW: 220# BMI: 28.35     Estimated Energy Needs   Kcals: 2600-3000 Protein: 104-130 g Fluid: 3 L     NUTRITION DIAGNOSIS: Inadequate PO intake r/t to pain and recent cancer diagnosis. Continues.     INTERVENTION:    Encouraged trial of Ginger Ale in morning with saltines or 1/4 piece dry toast prior to attempting to eat. Encouraged liquid diet and if not using 4 commercial ONS/day consider a MVI to round out nutrient intake to meet RDI for vitamins and minerals. Encouraged replacing water with higher calorie options. Relayed if using just whey protein powders need to boost calories with higher calories juices or fortified milks or shakes.  Encouraged blending soups to have varied flavors.  Emailed shake recipes with and without dairy.    MONITORING, EVALUATION, GOAL: weight trends, nutrition impact symptoms, PO intake, labs   Goal is weight maintenance   Next Visit: Remote 2 weeks during infusion.    Richard Bowman, RDN,  LDN Registered Dietitian, [facility] Part Time Remote (Usual office hours: Tuesday-Thursday) Mobile: [phone]

## 2023-01-13 ENCOUNTER — Encounter: Payer: Self-pay | Admitting: Hematology & Oncology

## 2023-01-14 ENCOUNTER — Inpatient Hospital Stay: Payer: Medicaid Other

## 2023-01-14 ENCOUNTER — Ambulatory Visit: Payer: Self-pay

## 2023-01-14 VITALS — BP 100/60 | HR 71 | Temp 98.2°F | Resp 18

## 2023-01-14 DIAGNOSIS — Z5111 Encounter for antineoplastic chemotherapy: Secondary | ICD-10-CM | POA: Diagnosis not present

## 2023-01-14 DIAGNOSIS — C189 Malignant neoplasm of colon, unspecified: Secondary | ICD-10-CM

## 2023-01-14 MED ORDER — PEGFILGRASTIM-CBQV 6 MG/0.6ML ~~LOC~~ SOSY
6.0000 mg | PREFILLED_SYRINGE | Freq: Once | SUBCUTANEOUS | Status: AC
  Administered 2023-01-14: 6 mg via SUBCUTANEOUS
  Filled 2023-01-14: qty 0.6

## 2023-01-14 MED ORDER — SODIUM CHLORIDE 0.9% FLUSH
10.0000 mL | INTRAVENOUS | Status: DC | PRN
  Administered 2023-01-14: 10 mL

## 2023-01-14 MED ORDER — HEPARIN SOD (PORK) LOCK FLUSH 100 UNIT/ML IV SOLN
500.0000 [IU] | Freq: Once | INTRAVENOUS | Status: AC | PRN
  Administered 2023-01-14: 500 [IU]

## 2023-01-14 NOTE — Patient Instructions (Signed)

## 2023-01-18 ENCOUNTER — Inpatient Hospital Stay: Payer: Medicaid Other

## 2023-01-18 NOTE — Progress Notes (Signed)
[facility] CSW Progress Note  Clinical Child psychotherapist returned patient's call.  He stated he might have secured an apartment.  Inquired about the [employer].  CSW to follow up.  He also asked about receiving a Medicaid card.  CSW informed him to contact the Medicaid office and he said he already had their contact number. He reports feeling better after his most recent chemo treatment.   Dorothey Baseman, LCSW Clinical Social Worker [facility]    Patient is participating in a Managed Medicaid Plan:  Yes

## 2023-01-20 ENCOUNTER — Inpatient Hospital Stay: Payer: Medicaid Other

## 2023-01-20 ENCOUNTER — Inpatient Hospital Stay: Payer: Medicaid Other | Admitting: Hematology & Oncology

## 2023-01-22 ENCOUNTER — Inpatient Hospital Stay: Payer: Medicaid Other

## 2023-01-26 ENCOUNTER — Inpatient Hospital Stay: Payer: Medicaid Other

## 2023-01-26 ENCOUNTER — Other Ambulatory Visit: Payer: Self-pay

## 2023-01-26 ENCOUNTER — Inpatient Hospital Stay: Payer: Medicaid Other | Admitting: Hematology & Oncology

## 2023-01-26 ENCOUNTER — Ambulatory Visit: Payer: Medicaid Other | Admitting: Dietician

## 2023-01-26 DIAGNOSIS — Z95828 Presence of other vascular implants and grafts: Secondary | ICD-10-CM

## 2023-01-26 DIAGNOSIS — Z5111 Encounter for antineoplastic chemotherapy: Secondary | ICD-10-CM | POA: Diagnosis not present

## 2023-01-26 DIAGNOSIS — C787 Secondary malignant neoplasm of liver and intrahepatic bile duct: Secondary | ICD-10-CM

## 2023-01-26 LAB — CEA (IN HOUSE-CHCC): CEA (CHCC-In House): 64.16 ng/mL — ABNORMAL HIGH (ref 0.00–5.00)

## 2023-01-26 LAB — CMP (CANCER CENTER ONLY)
ALT: 35 U/L (ref 0–44)
AST: 29 U/L (ref 15–41)
Albumin: 3.4 g/dL — ABNORMAL LOW (ref 3.5–5.0)
Alkaline Phosphatase: 266 U/L — ABNORMAL HIGH (ref 38–126)
Anion gap: 7 (ref 5–15)
BUN: 7 mg/dL (ref 6–20)
CO2: 27 mmol/L (ref 22–32)
Calcium: 9.7 mg/dL (ref 8.9–10.3)
Chloride: 107 mmol/L (ref 98–111)
Creatinine: 0.93 mg/dL (ref 0.61–1.24)
GFR, Estimated: >60 mL/min (ref >60)
Glucose, Bld: 150 mg/dL — ABNORMAL HIGH (ref 70–99)
Potassium: 3.9 mmol/L (ref 3.5–5.1)
Sodium: 141 mmol/L (ref 135–145)
Total Bilirubin: 0.2 mg/dL — ABNORMAL LOW (ref 0.3–1.2)
Total Protein: 6 g/dL — ABNORMAL LOW (ref 6.5–8.1)

## 2023-01-26 LAB — CBC WITH DIFFERENTIAL (CANCER CENTER ONLY)
Abs Immature Granulocytes: 1.2 10*3/uL — ABNORMAL HIGH (ref 0.00–0.07)
Band Neutrophils: 1 %
Basophils Absolute: 0.1 10*3/uL (ref 0.0–0.1)
Basophils Relative: 1 %
Eosinophils Absolute: 0.3 10*3/uL (ref 0.0–0.5)
Eosinophils Relative: 2 %
HCT: 38.3 % — ABNORMAL LOW (ref 39.0–52.0)
Hemoglobin: 12.3 g/dL — ABNORMAL LOW (ref 13.0–17.0)
Lymphocytes Relative: 20 %
Lymphs Abs: 2.9 10*3/uL (ref 0.7–4.0)
MCH: 28 pg (ref 26.0–34.0)
MCHC: 32.1 g/dL (ref 30.0–36.0)
MCV: 87.2 fL (ref 80.0–100.0)
Metamyelocytes Relative: 3 %
Monocytes Absolute: 0.3 10*3/uL (ref 0.1–1.0)
Monocytes Relative: 2 %
Myelocytes: 5 %
Neutro Abs: 9.8 10*3/uL — ABNORMAL HIGH (ref 1.7–7.7)
Neutrophils Relative %: 66 %
Platelet Count: 215 10*3/uL (ref 150–400)
RBC: 4.39 MIL/uL (ref 4.22–5.81)
RDW: 14.4 % (ref 11.5–15.5)
Smear Review: NORMAL
WBC Count: 14.7 10*3/uL — ABNORMAL HIGH (ref 4.0–10.5)
nRBC: 0.2 % (ref 0.0–0.2)

## 2023-01-26 LAB — RETICULOCYTES
Immature Retic Fract: 21.1 % — ABNORMAL HIGH (ref 2.3–15.9)
RBC.: 4.3 MIL/uL (ref 4.22–5.81)
Retic Count, Absolute: 83.5 10*3/uL (ref 19.0–186.0)
Retic Ct Pct: 1.9 % (ref 0.4–3.1)

## 2023-01-26 LAB — IRON AND IRON BINDING CAPACITY (CC-WL,HP ONLY)
Iron: 40 ug/dL — ABNORMAL LOW (ref 45–182)
Saturation Ratios: 13 % — ABNORMAL LOW (ref 17.9–39.5)
TIBC: 311 ug/dL (ref 250–450)
UIBC: 271 ug/dL (ref 117–376)

## 2023-01-26 LAB — LACTATE DEHYDROGENASE: LDH: 146 U/L (ref 98–192)

## 2023-01-26 LAB — FERRITIN: Ferritin: 375 ng/mL — ABNORMAL HIGH (ref 24–336)

## 2023-01-26 MED ORDER — SODIUM CHLORIDE 0.9% FLUSH
10.0000 mL | Freq: Once | INTRAVENOUS | Status: AC
  Administered 2023-01-26: 10 mL via INTRAVENOUS

## 2023-01-26 MED ORDER — HEPARIN SOD (PORK) LOCK FLUSH 100 UNIT/ML IV SOLN
500.0000 [IU] | Freq: Once | INTRAVENOUS | Status: AC
  Administered 2023-01-26: 500 [IU] via INTRAVENOUS

## 2023-01-26 NOTE — Patient Instructions (Signed)

## 2023-01-26 NOTE — Progress Notes (Signed)
Patient wants to get treated tomorrow, 01/27/23, since he was late and has a long treatment. Today's labs still pending. Port-a-cath reinforced with 4x4 gauze and secured with hypofix. Patient is aware of new times and dates for treatment.

## 2023-01-26 NOTE — Addendum Note (Signed)
Addended by: Lenn Sink I on: 01/26/2023 10:01 AM   Modules accepted: Orders

## 2023-01-26 NOTE — Progress Notes (Signed)
Nutrition Follow Up:   Called patient on mobile for remote follow up on PO intake and NIS with prior weight loss. He rescheduled infusion tx to tomorrow so no new weights on file.  Smoking some marijuana which has improved his appetite getting at least 2-3 meals a day. He believes his weight has rebounded to 180#s.  He's able to eat more real food and meals. Trying to stay away from too much greasy food. Eating a lot of chicken also had burger the other day, may have slacked off a bit with his fruits and vegetable.  Been tolerating milk and cheese.  Bowels are more regular now.  Drinking water milk, and orange juice daily.  He recognizes he has lost lots of muscle mass.  He'd like to get back into a physical therapy program to help rebuild that LBM.     Medications: reviewed   Labs: Reviewed 01/26/23     Anthropometrics:  no new weight on file   Height: 69" Weight:  01/12/23  173# 12/29/22  180# 12/20/22  188# 12/01/22  192# 11/15/22  202# 10/28/22  223# UBW: 220# BMI: 28.35     Estimated Energy Needs   Kcals: 2600-3000 Protein: 104-130 g Fluid: 3 L     NUTRITION DIAGNOSIS: Inadequate PO intake r/t to pain and recent cancer diagnosis. Per recall, improving.     INTERVENTION:    Encouraged dairy and or whey protein powders need to aid in rebuilding LMB.  Encouraged increased frequency of feeds. Offered resources or recipes, patient declined any needs at this time..  Reviewed fruits best to use if diarrhea or bowels regularly is a concerns.   MONITORING, EVALUATION, GOAL: weight trends, nutrition impact symptoms, PO intake, labs   Goal is weight maintenance   Next Visit: Remote next month    Gennaro Africa, RDN, LDN Registered Dietitian, [facility] Part Time Remote (Usual office hours: Tuesday-Thursday) Mobile: [phone]

## 2023-01-27 ENCOUNTER — Other Ambulatory Visit: Payer: Self-pay

## 2023-01-27 ENCOUNTER — Other Ambulatory Visit (HOSPITAL_BASED_OUTPATIENT_CLINIC_OR_DEPARTMENT_OTHER): Payer: Self-pay

## 2023-01-27 ENCOUNTER — Inpatient Hospital Stay (HOSPITAL_BASED_OUTPATIENT_CLINIC_OR_DEPARTMENT_OTHER): Payer: Medicaid Other | Admitting: Hematology & Oncology

## 2023-01-27 ENCOUNTER — Other Ambulatory Visit: Payer: Self-pay | Admitting: *Deleted

## 2023-01-27 ENCOUNTER — Encounter: Payer: Self-pay | Admitting: *Deleted

## 2023-01-27 ENCOUNTER — Inpatient Hospital Stay: Payer: Medicaid Other

## 2023-01-27 VITALS — BP 120/69 | HR 54 | Temp 98.0°F | Resp 18 | Ht 70.0 in | Wt 182.0 lb

## 2023-01-27 DIAGNOSIS — C787 Secondary malignant neoplasm of liver and intrahepatic bile duct: Secondary | ICD-10-CM | POA: Diagnosis not present

## 2023-01-27 DIAGNOSIS — Z5111 Encounter for antineoplastic chemotherapy: Secondary | ICD-10-CM | POA: Diagnosis not present

## 2023-01-27 DIAGNOSIS — C189 Malignant neoplasm of colon, unspecified: Secondary | ICD-10-CM

## 2023-01-27 MED ORDER — OXYCODONE HCL 5 MG PO TABS
5.0000 mg | ORAL_TABLET | Freq: Four times a day (QID) | ORAL | 0 refills | Status: DC | PRN
Start: 2023-01-27 — End: 2023-02-12
  Filled 2023-01-27: qty 60, 15d supply, fill #0

## 2023-01-27 MED ORDER — DEXTROSE 5 % IV SOLN: Freq: Once | INTRAVENOUS | Status: AC

## 2023-01-27 MED ORDER — LEUCOVORIN CALCIUM INJECTION 350 MG
400.0000 mg/m2 | Freq: Once | INTRAVENOUS | Status: DC
  Filled 2023-01-27: qty 41.2

## 2023-01-27 MED ORDER — PALONOSETRON HCL INJECTION 0.25 MG/5ML
0.2500 mg | Freq: Once | INTRAVENOUS | Status: AC
  Administered 2023-01-27: 0.25 mg via INTRAVENOUS
  Filled 2023-01-27: qty 5

## 2023-01-27 MED ORDER — SODIUM CHLORIDE 0.9% FLUSH: 10.0000 mL | INTRAVENOUS | Status: DC | PRN

## 2023-01-27 MED ORDER — HEPARIN SOD (PORK) LOCK FLUSH 100 UNIT/ML IV SOLN: 500.0000 [IU] | Freq: Once | INTRAVENOUS | Status: DC | PRN

## 2023-01-27 MED ORDER — SODIUM CHLORIDE 0.9 % IV SOLN
10.0000 mg | Freq: Once | INTRAVENOUS | Status: AC
  Administered 2023-01-27: 10 mg via INTRAVENOUS
  Filled 2023-01-27: qty 10

## 2023-01-27 MED ORDER — SODIUM CHLORIDE 0.9 % IV SOLN
2400.0000 mg/m2 | INTRAVENOUS | Status: DC
  Administered 2023-01-27: 5000 mg via INTRAVENOUS
  Filled 2023-01-27: qty 100

## 2023-01-27 MED ORDER — OXALIPLATIN CHEMO INJECTION 100 MG/20ML
85.0000 mg/m2 | Freq: Once | INTRAVENOUS | Status: AC
  Administered 2023-01-27: 175 mg via INTRAVENOUS
  Filled 2023-01-27: qty 35

## 2023-01-27 MED ORDER — ATROPINE SULFATE 1 MG/ML IV SOLN
0.5000 mg | Freq: Once | INTRAVENOUS | Status: AC | PRN
  Administered 2023-01-27: 0.5 mg via INTRAVENOUS
  Filled 2023-01-27: qty 1

## 2023-01-27 MED ORDER — SODIUM CHLORIDE 0.9 % IV SOLN
165.0000 mg/m2 | Freq: Once | INTRAVENOUS | Status: AC
  Administered 2023-01-27: 340 mg via INTRAVENOUS
  Filled 2023-01-27: qty 15

## 2023-01-27 MED ORDER — PROCHLORPERAZINE MALEATE 10 MG PO TABS
10.0000 mg | ORAL_TABLET | Freq: Four times a day (QID) | ORAL | 3 refills | Status: DC | PRN
Start: 2023-01-27 — End: 2023-07-12
  Filled 2023-01-27: qty 30, 8d supply, fill #0

## 2023-01-27 MED ORDER — SODIUM CHLORIDE 0.9 % IV SOLN
150.0000 mg | Freq: Once | INTRAVENOUS | Status: AC
  Administered 2023-01-27: 150 mg via INTRAVENOUS
  Filled 2023-01-27: qty 150

## 2023-01-27 MED ORDER — LEUCOVORIN CALCIUM INJECTION 350 MG
400.0000 mg/m2 | Freq: Once | INTRAVENOUS | Status: AC
  Administered 2023-01-27: 824 mg via INTRAVENOUS
  Filled 2023-01-27: qty 41.2

## 2023-01-27 NOTE — Patient Instructions (Addendum)
Oxaliplatin Injection What is this medication? OXALIPLATIN (ox AL i PLA tin) treats colorectal cancer. It works by slowing down the growth of cancer cells. This medicine may be used for other purposes; ask your health care provider or pharmacist if you have questions. COMMON BRAND NAME(S): Eloxatin What should I tell my care team before I take this medication? They need to know if you have any of these conditions: Heart disease History of irregular heartbeat or rhythm Liver disease Low blood cell levels (white cells, red cells, and platelets) Lung or breathing disease, such as asthma Take medications that treat or prevent blood clots Tingling of the fingers, toes, or other nerve disorder An unusual or allergic reaction to oxaliplatin, other medications, foods, dyes, or preservatives If you or your partner are pregnant or trying to get pregnant Breast-feeding How should I use this medication? This medication is injected into a vein. It is given by your care team in a hospital or clinic setting. Talk to your care team about the use of this medication in children. Special care may be needed. Overdosage: If you think you have taken too much of this medicine contact a poison control center or emergency room at once. NOTE: This medicine is only for you. Do not share this medicine with others. What if I miss a dose? Keep appointments for follow-up doses. It is important not to miss a dose. Call your care team if you are unable to keep an appointment. What may interact with this medication? Do not take this medication with any of the following: Cisapride Dronedarone Pimozide Thioridazine This medication may also interact with the following: Aspirin and aspirin-like medications Certain medications that treat or prevent blood clots, such as warfarin, apixaban, dabigatran, and rivaroxaban Cisplatin Cyclosporine Diuretics Medications for infection, such as acyclovir, adefovir, amphotericin B,  bacitracin, cidofovir, foscarnet, ganciclovir, gentamicin, pentamidine, vancomycin NSAIDs, medications for pain and inflammation, such as ibuprofen or naproxen Other medications that cause heart rhythm changes Pamidronate Zoledronic acid This list may not describe all possible interactions. Give your health care provider a list of all the medicines, herbs, non-prescription drugs, or dietary supplements you use. Also tell them if you smoke, drink alcohol, or use illegal drugs. Some items may interact with your medicine. What should I watch for while using this medication? Your condition will be monitored carefully while you are receiving this medication. You may need blood work while taking this medication. This medication may make you feel generally unwell. This is not uncommon as chemotherapy can affect healthy cells as well as cancer cells. Report any side effects. Continue your course of treatment even though you feel ill unless your care team tells you to stop. This medication may increase your risk of getting an infection. Call your care team for advice if you get a fever, chills, sore throat, or other symptoms of a cold or flu. Do not treat yourself. Try to avoid being around people who are sick. Avoid taking medications that contain aspirin, acetaminophen, ibuprofen, naproxen, or ketoprofen unless instructed by your care team. These medications may hide a fever. Be careful brushing or flossing your teeth or using a toothpick because you may get an infection or bleed more easily. If you have any dental work done, tell your dentist you are receiving this medication. This medication can make you more sensitive to cold. Do not drink cold drinks or use ice. Cover exposed skin before coming in contact with cold temperatures or cold objects. When out in cold weather  wear warm clothing and cover your mouth and nose to warm the air that goes into your lungs. Tell your care team if you get sensitive to the  cold. Talk to your care team if you or your partner are pregnant or think either of you might be pregnant. This medication can cause serious birth defects if taken during pregnancy and for 9 months after the last dose. A negative pregnancy test is required before starting this medication. A reliable form of contraception is recommended while taking this medication and for 9 months after the last dose. Talk to your care team about effective forms of contraception. Do not father a child while taking this medication and for 6 months after the last dose. Use a condom while having sex during this time period. Do not breastfeed while taking this medication and for 3 months after the last dose. This medication may cause infertility. Talk to your care team if you are concerned about your fertility. What side effects may I notice from receiving this medication? Side effects that you should report to your care team as soon as possible: Allergic reactions--skin rash, itching, hives, swelling of the face, lips, tongue, or throat Bleeding--bloody or black, tar-like stools, vomiting blood or brown material that looks like coffee grounds, red or dark brown urine, small red or purple spots on skin, unusual bruising or bleeding Dry cough, shortness of breath or trouble breathing Heart rhythm changes--fast or irregular heartbeat, dizziness, feeling faint or lightheaded, chest pain, trouble breathing Infection--fever, chills, cough, sore throat, wounds that don't heal, pain or trouble when passing urine, general feeling of discomfort or being unwell Liver injury--right upper belly pain, loss of appetite, nausea, light-colored stool, dark yellow or brown urine, yellowing skin or eyes, unusual weakness or fatigue Low red blood cell level--unusual weakness or fatigue, dizziness, headache, trouble breathing Muscle injury--unusual weakness or fatigue, muscle pain, dark yellow or brown urine, decrease in amount of urine Pain,  tingling, or numbness in the hands or feet Sudden and severe headache, confusion, change in vision, seizures, which may be signs of posterior reversible encephalopathy syndrome (PRES) Unusual bruising or bleeding Side effects that usually do not require medical attention (report to your care team if they continue or are bothersome): Diarrhea Nausea Pain, redness, or swelling with sores inside the mouth or throat Unusual weakness or fatigue Vomiting This list may not describe all possible side effects. Call your doctor for medical advice about side effects. You may report side effects to FDA at 1-800-FDA-1088. Where should I keep my medication? This medication is given in a hospital or clinic. It will not be stored at home. NOTE: This sheet is a summary. It may not cover all possible information. If you have questions about this medicine, talk to your doctor, pharmacist, or health care provider.  2024 Elsevier/Gold Standard (2022-10-25 00:00:00)   Leucovorin Injection What is this medication? LEUCOVORIN (loo koe VOR in) prevents side effects from certain medications, such as methotrexate. It works by increasing folate levels. This helps protect healthy cells in your body. It may also be used to treat anemia caused by low levels of folate. It can also be used with fluorouracil, a type of chemotherapy, to treat colorectal cancer. It works by increasing the effects of fluorouracil in the body. This medicine may be used for other purposes; ask your health care provider or pharmacist if you have questions. What should I tell my care team before I take this medication? They need to know if  you have any of these conditions: Anemia from low levels of vitamin B12 in the blood An unusual or allergic reaction to leucovorin, folic acid, other medications, foods, dyes, or preservatives Pregnant or trying to get pregnant Breastfeeding How should I use this medication? This medication is injected into a  vein or a muscle. It is given by your care team in a hospital or clinic setting. Talk to your care team about the use of this medication in children. Special care may be needed. Overdosage: If you think you have taken too much of this medicine contact a poison control center or emergency room at once. NOTE: This medicine is only for you. Do not share this medicine with others. What if I miss a dose? Keep appointments for follow-up doses. It is important not to miss your dose. Call your care team if you are unable to keep an appointment. What may interact with this medication? Capecitabine Fluorouracil Phenobarbital Phenytoin Primidone Trimethoprim;sulfamethoxazole This list may not describe all possible interactions. Give your health care provider a list of all the medicines, herbs, non-prescription drugs, or dietary supplements you use. Also tell them if you smoke, drink alcohol, or use illegal drugs. Some items may interact with your medicine. What should I watch for while using this medication? Your condition will be monitored carefully while you are receiving this medication. This medication may increase the side effects of 5-fluorouracil. Tell your care team if you have diarrhea or mouth sores that do not get better or that get worse. What side effects may I notice from receiving this medication? Side effects that you should report to your care team as soon as possible: Allergic reactions--skin rash, itching, hives, swelling of the face, lips, tongue, or throat This list may not describe all possible side effects. Call your doctor for medical advice about side effects. You may report side effects to FDA at 1-800-FDA-1088. Where should I keep my medication? This medication is given in a hospital or clinic. It will not be stored at home. NOTE: This sheet is a summary. It may not cover all possible information. If you have questions about this medicine, talk to your doctor, pharmacist, or  health care provider.  2024 Elsevier/Gold Standard (2022-01-20 00:00:00)   Irinotecan Injection What is this medication? IRINOTECAN (ir in oh TEE kan) treats some types of cancer. It works by slowing down the growth of cancer cells. This medicine may be used for other purposes; ask your health care provider or pharmacist if you have questions. COMMON BRAND NAME(S): Camptosar What should I tell my care team before I take this medication? They need to know if you have any of these conditions: Dehydration Diarrhea Infection, especially a viral infection, such as chickenpox, cold sores, herpes Liver disease Low blood cell levels (white cells, red cells, and platelets) Low levels of electrolytes, such as calcium, magnesium, or potassium in your blood Recent or ongoing radiation An unusual or allergic reaction to irinotecan, other medications, foods, dyes, or preservatives If you or your partner are pregnant or trying to get pregnant Breast-feeding How should I use this medication? This medication is injected into a vein. It is given by your care team in a hospital or clinic setting. Talk to your care team about the use of this medication in children. Special care may be needed. Overdosage: If you think you have taken too much of this medicine contact a poison control center or emergency room at once. NOTE: This medicine is only for you. Do  not share this medicine with others. What if I miss a dose? Keep appointments for follow-up doses. It is important not to miss your dose. Call your care team if you are unable to keep an appointment. What may interact with this medication? Do not take this medication with any of the following: Cobicistat Itraconazole This medication may also interact with the following: Certain antibiotics, such as clarithromycin, rifampin, rifabutin Certain antivirals for HIV or AIDS Certain medications for fungal infections, such as ketoconazole, posaconazole,  voriconazole Certain medications for seizures, such as carbamazepine, phenobarbital, phenytoin Gemfibrozil Nefazodone St. John's wort This list may not describe all possible interactions. Give your health care provider a list of all the medicines, herbs, non-prescription drugs, or dietary supplements you use. Also tell them if you smoke, drink alcohol, or use illegal drugs. Some items may interact with your medicine. What should I watch for while using this medication? Your condition will be monitored carefully while you are receiving this medication. You may need blood work while taking this medication. This medication may make you feel generally unwell. This is not uncommon as chemotherapy can affect healthy cells as well as cancer cells. Report any side effects. Continue your course of treatment even though you feel ill unless your care team tells you to stop. This medication can cause serious side effects. To reduce the risk, your care team may give you other medications to take before receiving this one. Be sure to follow the directions from your care team. This medication may affect your coordination, reaction time, or judgement. Do not drive or operate machinery until you know how this medication affects you. Sit up or stand slowly to reduce the risk of dizzy or fainting spells. Drinking alcohol with this medication can increase the risk of these side effects. This medication may increase your risk of getting an infection. Call your care team for advice if you get a fever, chills, sore throat, or other symptoms of a cold or flu. Do not treat yourself. Try to avoid being around people who are sick. Avoid taking medications that contain aspirin, acetaminophen, ibuprofen, naproxen, or ketoprofen unless instructed by your care team. These medications may hide a fever. This medication may increase your risk to bruise or bleed. Call your care team if you notice any unusual bleeding. Be careful  brushing or flossing your teeth or using a toothpick because you may get an infection or bleed more easily. If you have any dental work done, tell your dentist you are receiving this medication. Talk to your care team if you or your partner are pregnant or think either of you might be pregnant. This medication can cause serious birth defects if taken during pregnancy and for 6 months after the last dose. You will need a negative pregnancy test before starting this medication. Contraception is recommended while taking this medication and for 6 months after the last dose. Your care team can help you find the option that works for you. Do not father a child while taking this medication and for 3 months after the last dose. Use a condom for contraception during this time period. Do not breastfeed while taking this medication and for 7 days after the last dose. This medication may cause infertility. Talk to your care team if you are concerned about your fertility. What side effects may I notice from receiving this medication? Side effects that you should report to your care team as soon as possible: Allergic reactions--skin rash, itching, hives, swelling of  the face, lips, tongue, or throat Dry cough, shortness of breath or trouble breathing Increased saliva or tears, increased sweating, stomach cramping, diarrhea, small pupils, unusual weakness or fatigue, slow heartbeat Infection--fever, chills, cough, sore throat, wounds that don't heal, pain or trouble when passing urine, general feeling of discomfort or being unwell Kidney injury--decrease in the amount of urine, swelling of the ankles, hands, or feet Low red blood cell level--unusual weakness or fatigue, dizziness, headache, trouble breathing Severe or prolonged diarrhea Unusual bruising or bleeding Side effects that usually do not require medical attention (report to your care team if they continue or are bothersome): Constipation Diarrhea Hair  loss Loss of appetite Nausea Stomach pain This list may not describe all possible side effects. Call your doctor for medical advice about side effects. You may report side effects to FDA at 1-800-FDA-1088. Where should I keep my medication? This medication is given in a hospital or clinic. It will not be stored at home. NOTE: This sheet is a summary. It may not cover all possible information. If you have questions about this medicine, talk to your doctor, pharmacist, or health care provider.  2024 Elsevier/Gold Standard (2021-12-29 00:00:00)    [facility]  Discharge Instructions: Thank you for choosing [facility] to provide your oncology and hematology care.   If you have a lab appointment with the Cancer Center, please go directly to the Cancer Center and check in at the registration area.  Wear comfortable clothing and clothing appropriate for easy access to any Portacath or PICC line.   We strive to give you quality time with your provider. You may need to reschedule your appointment if you arrive late (15 or more minutes).  Arriving late affects you and other patients whose appointments are after yours.  Also, if you miss three or more appointments without notifying the office, you may be dismissed from the clinic at the provider's discretion.      For prescription refill requests, have your pharmacy contact our office and allow 72 hours for refills to be completed.    Today you received the following chemotherapy and/or immunotherapy agents Oxaliplatin, irrinotecan, leucovorin, 5FU       To help prevent nausea and vomiting after your treatment, we encourage you to take your nausea medication as directed.  BELOW ARE SYMPTOMS THAT SHOULD BE REPORTED IMMEDIATELY: *FEVER GREATER THAN 100.4 F (38 C) OR HIGHER *CHILLS OR SWEATING *NAUSEA AND VOMITING THAT IS NOT CONTROLLED WITH YOUR NAUSEA MEDICATION *UNUSUAL SHORTNESS OF  BREATH *UNUSUAL BRUISING OR BLEEDING *URINARY PROBLEMS (pain or burning when urinating, or frequent urination) *BOWEL PROBLEMS (unusual diarrhea, constipation, pain near the anus) TENDERNESS IN MOUTH AND THROAT WITH OR WITHOUT PRESENCE OF ULCERS (sore throat, sores in mouth, or a toothache) UNUSUAL RASH, SWELLING OR PAIN  UNUSUAL VAGINAL DISCHARGE OR ITCHING   Items with * indicate a potential emergency and should be followed up as soon as possible or go to the Emergency Department if any problems should occur.  Please show the CHEMOTHERAPY ALERT CARD or IMMUNOTHERAPY ALERT CARD at check-in to the Emergency Department and triage nurse. Should you have questions after your visit or need to cancel or reschedule your appointment, please contact [facility] [place]  [phone] and follow the prompts.  Office hours are 8:00 a.m. to 4:30 p.m. Monday - Friday. Please note that voicemails left after 4:00 p.m. may not be returned until the following business  day.  We are closed weekends and major holidays. You have access to a nurse at all times for urgent questions. Please call the main number to the clinic [phone] and follow the prompts.  For any non-urgent questions, you may also contact your provider using MyChart. We now offer e-Visits for anyone 68 and older to request care online for non-urgent symptoms. For details visit [email or web address].   Also download the MyChart app! Go to the app store, search "MyChart", open the app, select [facility], and log in with your MyChart username and password.

## 2023-01-27 NOTE — Progress Notes (Signed)
Hematology and Oncology Follow Up Visit  Richard Bowman [id number] July 01, 1976 47 y.o. 01/27/2023   Principle Diagnosis:  Metastatic adenocarcinoma of the colon-liver metastasis -- pMMR/MSI-low -- (+) KRAS/ ERBB2(+)       Iron deficiency anemia Current Therapy:   FOLFOXIRI - s/p cycle #3  - start on  -- 12/09/2022 IV iron as indicated     Interim History:  Mr. Partington is back for follow-up.  He continues to improve.  He says he is not sure he really needs any more pain medication.  He does have little bit of discomfort.  This is mostly in the upper abdomen.  His last CEA level was now down to 68.  Again he is responding quite well.  I had to believe that we do our next set of scans, we will see that the cancer is much smaller.  He has had a better appetite.  He has had no problems with nausea or vomiting.  He has had no rashes.  There is been no obvious bleeding.  He  does have some iron deficiency.  His last iron saturation was 13%.  We will going give him some IV iron.  He has had no fever.  There is been no headache.  He has had no mouth sores.  Overall, his performance status is ECOG 1.  Medications:  Current Outpatient Medications:    acetaminophen (TYLENOL) 500 MG tablet, Take 2 tablets (1,000 mg total) by mouth every 6 (six) hours as needed for mild pain., Disp: 30 tablet, Rfl: 0   dexamethasone (DECADRON) 4 MG tablet, Take 2 tablets (8 mg total) by mouth daily for 3 days, starting the day after chemotherapy. Take with food., Disp: 30 tablet, Rfl: 1   docusate sodium (COLACE) 100 MG capsule, Take 1 capsule (100 mg total) by mouth 2 (two) times daily. (Patient taking differently: Take 100 mg by mouth daily.), Disp: 10 capsule, Rfl: 0   feeding supplement (ENSURE ENLIVE / ENSURE PLUS) LIQD, Take 237 mLs by mouth 2 (two) times daily between meals., Disp: 237 mL, Rfl: 12   lactose free nutrition (BOOST) LIQD, Take 237 mLs by mouth daily as needed (Supplement)., Disp: , Rfl:     lactulose (CHRONULAC) 10 GM/15ML solution, Take 15 mLs (10 g total) by mouth 3 (three) times daily., Disp: 236 mL, Rfl: 0   lidocaine-prilocaine (EMLA) cream, Apply 1 Application topically as needed., Disp: 30 g, Rfl: 0   loperamide (IMODIUM A-D) 2 MG tablet, Take 2 tabs by mouth with first loose stool, then 1 tablet with each additional loose stool as needed. Do not exceed 8 tablets in a 24-hour period., Disp: 100 tablet, Rfl: 3   loratadine (CLARITIN) 10 MG tablet, Take 10 mg by mouth. Takes day of injection-Udenyca, post chemotherapy., Disp: , Rfl:    LORazepam (ATIVAN) 0.5 MG tablet, Place 1 tablet (0.5 mg total) under the tongue every 6 (six) hours as needed for anxiety. Please use this every 6 hours as needed for nausea., Disp: 60 tablet, Rfl: 0   methocarbamol (ROBAXIN) 500 MG tablet, Take 1 tablet (500 mg total) by mouth every 6 (six) hours as needed for muscle spasms., Disp: 30 tablet, Rfl: 0   nicotine (NICODERM CQ - DOSED IN MG/24 HOURS) 21 mg/24hr patch, Place 1 patch (21 mg total) onto the skin daily., Disp: 30 patch, Rfl: 1   nystatin (MYCOSTATIN) 100000 UNIT/ML suspension, Take 5 mLs (500,000 Units total) by mouth 4 (four) times daily., Disp: 473 mL, Rfl:  1   ondansetron (ZOFRAN) 8 MG tablet, Take 1 tablet (8 mg total) by mouth every 8 (eight) hours as needed for nausea or vomiting. Start on the third day after cisplatin., Disp: 30 tablet, Rfl: 1   oxyCODONE (OXY IR/ROXICODONE) 5 MG immediate release tablet, Take 1 tablet (5 mg total) by mouth every 6 (six) hours as needed for severe pain., Disp: 60 tablet, Rfl: 0   pantoprazole (PROTONIX) 40 MG tablet, Take 1 tablet (40 mg total) by mouth 2 (two) times daily., Disp: 60 tablet, Rfl: 6   polyethylene glycol (MIRALAX / GLYCOLAX) 17 g packet, Take 17 g by mouth daily as needed for mild constipation., Disp: 14 each, Rfl: 0   prochlorperazine (COMPAZINE) 10 MG tablet, Take 1 tablet (10 mg total) by mouth every 6 (six) hours as needed for nausea  or vomiting., Disp: 30 tablet, Rfl: 3 No current facility-administered medications for this visit.  Facility-Administered Medications Ordered in Other Visits:    dexamethasone (DECADRON) 10 mg in sodium chloride 0.9 % 50 mL IVPB, 10 mg, Intravenous, Once, Tarique Loveall, Rose Phi, MD   dextrose 5 % solution, , Intravenous, Once, Shan Padgett, Rose Phi, MD   fluorouracil (ADRUCIL) 5,000 mg in sodium chloride 0.9 % 150 mL chemo infusion, 2,400 mg/m2 (Treatment Plan Recorded), Intravenous, 1 day or 1 dose, Francois Elk, Rose Phi, MD   fosaprepitant (EMEND) 150 mg in sodium chloride 0.9 % 145 mL IVPB, 150 mg, Intravenous, Once, Stehanie Ekstrom, Rose Phi, MD   heparin lock flush 100 unit/mL, 500 Units, Intracatheter, Once PRN, Myna Hidalgo, Rose Phi, MD   irinotecan (CAMPTOSAR) 340 mg in sodium chloride 0.9 % 500 mL chemo infusion, 165 mg/m2 (Treatment Plan Recorded), Intravenous, Once, Keiyon Plack, Rose Phi, MD   leucovorin 824 mg in dextrose 5 % 250 mL infusion, 400 mg/m2 (Treatment Plan Recorded), Intravenous, Once, Omayra Tulloch, Rose Phi, MD   oxaliplatin (ELOXATIN) 175 mg in dextrose 5 % 500 mL chemo infusion, 85 mg/m2 (Treatment Plan Recorded), Intravenous, Once, Elijiah Mickley, Rose Phi, MD   palonosetron (ALOXI) injection 0.25 mg, 0.25 mg, Intravenous, Once, Shataria Crist, Rose Phi, MD   sodium chloride flush (NS) 0.9 % injection 10 mL, 10 mL, Intracatheter, PRN, Myna Hidalgo, Rose Phi, MD  Allergies: No Known Allergies  Past Medical History, Surgical history, Social history, and Family History were reviewed and updated.  Review of Systems: Review of Systems  Constitutional: Negative.   HENT:  Negative.    Eyes: Negative.   Respiratory: Negative.    Cardiovascular: Negative.   Gastrointestinal:  Positive for abdominal pain and constipation.  Endocrine: Negative.   Genitourinary: Negative.    Musculoskeletal: Negative.   Skin: Negative.   Neurological: Negative.   Hematological: Negative.   Psychiatric/Behavioral: Negative.      Physical  Exam: His vital signs show temperature of 98.  Pulse 69.  Blood pressure 114/71.  Weight is 182 pounds.  Wt Readings from Last 3 Encounters:  01/27/23 182 lb (82.6 kg)  01/12/23 173 lb (78.5 kg)  12/29/22 180 lb (81.6 kg)    Physical Exam Vitals reviewed.  HENT:     Head: Normocephalic and atraumatic.  Eyes:     Pupils: Pupils are equal, round, and reactive to light.  Cardiovascular:     Rate and Rhythm: Normal rate and regular rhythm.     Heart sounds: Normal heart sounds.  Pulmonary:     Effort: Pulmonary effort is normal.     Breath sounds: Normal breath sounds.  Abdominal:     General: Bowel sounds  are normal.     Palpations: Abdomen is soft.     Comments: Abdominal exam shows a well-healed laparotomy scar.  He has no fluid wave.  There is no guarding or rebound tenderness.  He does have hepatomegaly with his liver edge about 7-8 cm below the right costal margin.  There is no splenomegaly.  Musculoskeletal:        General: No tenderness or deformity. Normal range of motion.     Cervical back: Normal range of motion.  Lymphadenopathy:     Cervical: No cervical adenopathy.  Skin:    General: Skin is warm and dry.     Findings: No erythema or rash.  Neurological:     Mental Status: He is alert and oriented to person, place, and time.  Psychiatric:        Behavior: Behavior normal.        Thought Content: Thought content normal.        Judgment: Judgment normal.      Lab Results  Component Value Date   WBC 14.7 (H) 01/26/2023   HGB 12.3 (L) 01/26/2023   HCT 38.3 (L) 01/26/2023   MCV 87.2 01/26/2023   PLT 215 01/26/2023     Chemistry      Component Value Date/Time   NA 141 01/26/2023 0850   K 3.9 01/26/2023 0850   CL 107 01/26/2023 0850   CO2 27 01/26/2023 0850   BUN 7 01/26/2023 0850   CREATININE 0.93 01/26/2023 0850      Component Value Date/Time   CALCIUM 9.7 01/26/2023 0850   ALKPHOS 266 (H) 01/26/2023 0850   AST 29 01/26/2023 0850   ALT 35  01/26/2023 0850   BILITOT 0.2 (L) 01/26/2023 0850       Impression and Plan: Mr. Hernandez is a very nice 47 year old white male.  He has really no past medical history.  He came to the hospital with abdominal pain.  He was found to have metastatic colon cancer.  We will go ahead with the fourth cycle of chemotherapy cycle of chemotherapy.  He is K-ras mutated.  As such, we cannot use EGFR inhibitors.  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  As such, we might be able to utilize anti-HER2 therapy in the future.  I still would just go with FOLFOXIRI.  I do not think we need to add Avastin right now.  I am sure that the CT scan would show that he is responding.  I will give him an extra week off after this fourth cycle treatment.  I think he will benefit from this.    Josph Macho, MD 5/29/20249:34 AM

## 2023-01-27 NOTE — Progress Notes (Unsigned)
Patient came to the office yesterday as scheduled but stated he didn't want to start treatment until today. Returned today and cleared for next cycle. He will need scans before his next cycle.   Oncology Nurse Navigator Documentation     01/27/2023   10:00 AM  Oncology Nurse Navigator Flowsheets  Navigator Follow Up Date: 02/15/2023  Navigator Follow Up Reason: Scan Review  Navigator Location [facility]  Navigator Encounter Type Treatment;Appt/Treatment Plan Review  Patient Visit Type MedOnc  Treatment Phase Active Tx  Barriers/Navigation Needs Coordination of Care;Education;Financial Toxicity;Unemployed  Interventions Psycho-Social Support  Acuity Level 2-Minimal Needs (1-2 Barriers Identified)  Time Spent with Patient 15

## 2023-01-28 ENCOUNTER — Ambulatory Visit: Payer: Self-pay

## 2023-01-28 ENCOUNTER — Encounter: Payer: Self-pay | Admitting: Hematology & Oncology

## 2023-01-29 ENCOUNTER — Other Ambulatory Visit: Payer: Self-pay | Admitting: *Deleted

## 2023-01-29 ENCOUNTER — Other Ambulatory Visit (HOSPITAL_BASED_OUTPATIENT_CLINIC_OR_DEPARTMENT_OTHER): Payer: Self-pay

## 2023-01-29 ENCOUNTER — Inpatient Hospital Stay: Payer: Medicaid Other

## 2023-01-29 ENCOUNTER — Other Ambulatory Visit: Payer: Self-pay

## 2023-01-29 VITALS — BP 109/67 | HR 84 | Temp 98.3°F | Resp 17

## 2023-01-29 DIAGNOSIS — C189 Malignant neoplasm of colon, unspecified: Secondary | ICD-10-CM

## 2023-01-29 DIAGNOSIS — Z5111 Encounter for antineoplastic chemotherapy: Secondary | ICD-10-CM | POA: Diagnosis not present

## 2023-01-29 MED ORDER — SODIUM CHLORIDE 0.9% FLUSH
10.0000 mL | INTRAVENOUS | Status: DC | PRN
  Administered 2023-01-29: 10 mL

## 2023-01-29 MED ORDER — ONDANSETRON HCL 8 MG PO TABS
8.0000 mg | ORAL_TABLET | Freq: Three times a day (TID) | ORAL | 1 refills | Status: DC | PRN
Start: 2023-01-29 — End: 2023-02-26
  Filled 2023-01-29: qty 30, 10d supply, fill #0

## 2023-01-29 MED ORDER — LORAZEPAM 0.5 MG PO TABS
0.5000 mg | ORAL_TABLET | Freq: Four times a day (QID) | ORAL | 0 refills | Status: DC | PRN
  Filled 2023-01-29: qty 60, 15d supply, fill #0

## 2023-01-29 MED ORDER — LORAZEPAM 0.5 MG PO TABS: 0.5000 mg | ORAL_TABLET | Freq: Four times a day (QID) | ORAL | 0 refills | Status: DC | PRN

## 2023-01-29 MED ORDER — HEPARIN SOD (PORK) LOCK FLUSH 100 UNIT/ML IV SOLN
500.0000 [IU] | Freq: Once | INTRAVENOUS | Status: AC | PRN
  Administered 2023-01-29: 500 [IU]

## 2023-01-29 MED ORDER — PEGFILGRASTIM-CBQV 6 MG/0.6ML ~~LOC~~ SOSY
6.0000 mg | PREFILLED_SYRINGE | Freq: Once | SUBCUTANEOUS | Status: AC
  Administered 2023-01-29: 6 mg via SUBCUTANEOUS
  Filled 2023-01-29: qty 0.6

## 2023-01-29 NOTE — Patient Instructions (Signed)

## 2023-02-01 ENCOUNTER — Other Ambulatory Visit: Payer: Self-pay

## 2023-02-01 ENCOUNTER — Telehealth: Payer: Self-pay

## 2023-02-01 ENCOUNTER — Other Ambulatory Visit (HOSPITAL_BASED_OUTPATIENT_CLINIC_OR_DEPARTMENT_OTHER): Payer: Self-pay

## 2023-02-01 NOTE — Telephone Encounter (Signed)
Received phone call from patient stating he was unsure of where the ativan prescription was sent. Reviewed prescription with patient and informed patient it was sent to [facility] on [facility] in [place]. Pt verbalized understanding and had no further questions. Pt appreciative of call.

## 2023-02-12 ENCOUNTER — Other Ambulatory Visit: Payer: Self-pay | Admitting: *Deleted

## 2023-02-12 ENCOUNTER — Other Ambulatory Visit (HOSPITAL_BASED_OUTPATIENT_CLINIC_OR_DEPARTMENT_OTHER): Payer: Self-pay

## 2023-02-12 DIAGNOSIS — C189 Malignant neoplasm of colon, unspecified: Secondary | ICD-10-CM

## 2023-02-12 MED ORDER — OXYCODONE HCL 5 MG PO TABS
5.0000 mg | ORAL_TABLET | Freq: Four times a day (QID) | ORAL | 0 refills | Status: DC | PRN
Start: 2023-02-12 — End: 2023-03-12
  Filled 2023-02-12: qty 60, 15d supply, fill #0

## 2023-02-15 ENCOUNTER — Inpatient Hospital Stay: Payer: Medicaid Other

## 2023-02-15 ENCOUNTER — Encounter (HOSPITAL_BASED_OUTPATIENT_CLINIC_OR_DEPARTMENT_OTHER): Payer: Self-pay

## 2023-02-15 ENCOUNTER — Other Ambulatory Visit (HOSPITAL_BASED_OUTPATIENT_CLINIC_OR_DEPARTMENT_OTHER): Payer: Self-pay

## 2023-02-15 ENCOUNTER — Ambulatory Visit (HOSPITAL_BASED_OUTPATIENT_CLINIC_OR_DEPARTMENT_OTHER)
Admission: RE | Admit: 2023-02-15 | Discharge: 2023-02-15 | Disposition: A | Discharge status: Home / Self Care | Payer: Medicaid Other | Source: Ambulatory Visit | Attending: Hematology & Oncology | Admitting: Hematology & Oncology

## 2023-02-15 DIAGNOSIS — C787 Secondary malignant neoplasm of liver and intrahepatic bile duct: Secondary | ICD-10-CM | POA: Insufficient documentation

## 2023-02-15 DIAGNOSIS — C189 Malignant neoplasm of colon, unspecified: Secondary | ICD-10-CM | POA: Diagnosis present

## 2023-02-15 MED ORDER — IOHEXOL 300 MG/ML  SOLN
100.0000 mL | Freq: Once | INTRAMUSCULAR | Status: AC | PRN
  Administered 2023-02-15: 100 mL via INTRAVENOUS

## 2023-02-16 ENCOUNTER — Other Ambulatory Visit: Payer: Self-pay

## 2023-02-16 ENCOUNTER — Encounter: Payer: Self-pay | Admitting: *Deleted

## 2023-02-16 DIAGNOSIS — C787 Secondary malignant neoplasm of liver and intrahepatic bile duct: Secondary | ICD-10-CM

## 2023-02-16 NOTE — Progress Notes (Signed)
Reviewed CT scans which show good treatment response.  Oncology Nurse Navigator Documentation     02/16/2023    9:15 AM  Oncology Nurse Navigator Flowsheets  Navigator Follow Up Date: 02/17/2023  Navigator Follow Up Reason: Follow-up Appointment;Chemotherapy  Navigator Location [facility]  Navigator Encounter Type Scan Review  Patient Visit Type MedOnc  Treatment Phase Active Tx  Barriers/Navigation Needs Coordination of Care;Education;Financial Toxicity;Unemployed  Interventions Psycho-Social Support  Acuity Level 2-Minimal Needs (1-2 Barriers Identified)  Time Spent with Patient 15

## 2023-02-17 ENCOUNTER — Inpatient Hospital Stay: Payer: Medicaid Other | Attending: Hematology & Oncology

## 2023-02-17 ENCOUNTER — Inpatient Hospital Stay: Payer: Medicaid Other

## 2023-02-17 ENCOUNTER — Encounter: Payer: Self-pay | Admitting: *Deleted

## 2023-02-17 VITALS — BP 116/69 | HR 46 | Temp 98.6°F | Resp 20

## 2023-02-17 DIAGNOSIS — C787 Secondary malignant neoplasm of liver and intrahepatic bile duct: Secondary | ICD-10-CM | POA: Diagnosis present

## 2023-02-17 DIAGNOSIS — Z5111 Encounter for antineoplastic chemotherapy: Secondary | ICD-10-CM | POA: Diagnosis present

## 2023-02-17 DIAGNOSIS — Z5189 Encounter for other specified aftercare: Secondary | ICD-10-CM | POA: Insufficient documentation

## 2023-02-17 DIAGNOSIS — C187 Malignant neoplasm of sigmoid colon: Secondary | ICD-10-CM | POA: Insufficient documentation

## 2023-02-17 DIAGNOSIS — C189 Malignant neoplasm of colon, unspecified: Secondary | ICD-10-CM

## 2023-02-17 LAB — CMP (CANCER CENTER ONLY)
ALT: 24 U/L (ref 0–44)
AST: 28 U/L (ref 15–41)
Albumin: 3.4 g/dL — ABNORMAL LOW (ref 3.5–5.0)
Alkaline Phosphatase: 247 U/L — ABNORMAL HIGH (ref 38–126)
Anion gap: 6 (ref 5–15)
BUN: 15 mg/dL (ref 6–20)
CO2: 29 mmol/L (ref 22–32)
Calcium: 9.6 mg/dL (ref 8.9–10.3)
Chloride: 105 mmol/L (ref 98–111)
Creatinine: 0.96 mg/dL (ref 0.61–1.24)
GFR, Estimated: >60 mL/min (ref >60)
Glucose, Bld: 139 mg/dL — ABNORMAL HIGH (ref 70–99)
Potassium: 3.7 mmol/L (ref 3.5–5.1)
Sodium: 140 mmol/L (ref 135–145)
Total Bilirubin: 0.3 mg/dL (ref 0.3–1.2)
Total Protein: 6.5 g/dL (ref 6.5–8.1)

## 2023-02-17 LAB — CBC WITH DIFFERENTIAL (CANCER CENTER ONLY)
Abs Immature Granulocytes: 0.13 10*3/uL — ABNORMAL HIGH (ref 0.00–0.07)
Basophils Absolute: 0.1 10*3/uL (ref 0.0–0.1)
Basophils Relative: 1 %
Eosinophils Absolute: 0.3 10*3/uL (ref 0.0–0.5)
Eosinophils Relative: 4 %
HCT: 36.4 % — ABNORMAL LOW (ref 39.0–52.0)
Hemoglobin: 11.5 g/dL — ABNORMAL LOW (ref 13.0–17.0)
Immature Granulocytes: 2 %
Lymphocytes Relative: 24 %
Lymphs Abs: 2.1 10*3/uL (ref 0.7–4.0)
MCH: 28.5 pg (ref 26.0–34.0)
MCHC: 31.6 g/dL (ref 30.0–36.0)
MCV: 90.1 fL (ref 80.0–100.0)
Monocytes Absolute: 1 10*3/uL (ref 0.1–1.0)
Monocytes Relative: 11 %
Neutro Abs: 5.2 10*3/uL (ref 1.7–7.7)
Neutrophils Relative %: 58 %
Platelet Count: 146 10*3/uL — ABNORMAL LOW (ref 150–400)
RBC: 4.04 MIL/uL — ABNORMAL LOW (ref 4.22–5.81)
RDW: 17.3 % — ABNORMAL HIGH (ref 11.5–15.5)
WBC Count: 8.7 10*3/uL (ref 4.0–10.5)
nRBC: 0 % (ref 0.0–0.2)

## 2023-02-17 LAB — IRON AND IRON BINDING CAPACITY (CC-WL,HP ONLY)
Iron: 55 ug/dL (ref 45–182)
Saturation Ratios: 16 % — ABNORMAL LOW (ref 17.9–39.5)
TIBC: 340 ug/dL (ref 250–450)
UIBC: 285 ug/dL (ref 117–376)

## 2023-02-17 LAB — FERRITIN: Ferritin: 176 ng/mL (ref 24–336)

## 2023-02-17 MED ORDER — SODIUM CHLORIDE 0.9 % IV SOLN
2400.0000 mg/m2 | INTRAVENOUS | Status: DC
  Administered 2023-02-17: 5000 mg via INTRAVENOUS
  Filled 2023-02-17: qty 100

## 2023-02-17 MED ORDER — OXALIPLATIN CHEMO INJECTION 100 MG/20ML
85.0000 mg/m2 | Freq: Once | INTRAVENOUS | Status: AC
  Administered 2023-02-17: 175 mg via INTRAVENOUS
  Filled 2023-02-17: qty 35

## 2023-02-17 MED ORDER — DEXTROSE 5 % IV SOLN: Freq: Once | INTRAVENOUS | Status: AC

## 2023-02-17 MED ORDER — ATROPINE SULFATE 1 MG/ML IV SOLN
0.5000 mg | Freq: Once | INTRAVENOUS | Status: AC | PRN
  Administered 2023-02-17: 0.5 mg via INTRAVENOUS
  Filled 2023-02-17: qty 1

## 2023-02-17 MED ORDER — SODIUM CHLORIDE 0.9 % IV SOLN
10.0000 mg | Freq: Once | INTRAVENOUS | Status: AC
  Administered 2023-02-17: 10 mg via INTRAVENOUS
  Filled 2023-02-17: qty 10

## 2023-02-17 MED ORDER — SODIUM CHLORIDE 0.9 % IV SOLN
165.0000 mg/m2 | Freq: Once | INTRAVENOUS | Status: AC
  Administered 2023-02-17: 340 mg via INTRAVENOUS
  Filled 2023-02-17: qty 17

## 2023-02-17 MED ORDER — SODIUM CHLORIDE 0.9 % IV SOLN
150.0000 mg | Freq: Once | INTRAVENOUS | Status: AC
  Administered 2023-02-17: 150 mg via INTRAVENOUS
  Filled 2023-02-17: qty 150

## 2023-02-17 MED ORDER — LEUCOVORIN CALCIUM INJECTION 350 MG
400.0000 mg/m2 | Freq: Once | INTRAVENOUS | Status: AC
  Administered 2023-02-17: 824 mg via INTRAVENOUS
  Filled 2023-02-17: qty 41.2

## 2023-02-17 MED ORDER — PALONOSETRON HCL INJECTION 0.25 MG/5ML
0.2500 mg | Freq: Once | INTRAVENOUS | Status: AC
  Administered 2023-02-17: 0.25 mg via INTRAVENOUS
  Filled 2023-02-17: qty 5

## 2023-02-17 NOTE — Addendum Note (Signed)
Addended by: Arlan Organ R on: 02/17/2023 09:52 AM   Modules accepted: Orders

## 2023-02-17 NOTE — Progress Notes (Signed)
Patient presents for cycle five. He had recent scans which showed good response. He was supposed to see a provider today, but that appointment was left off in error. He will follow up with Dr Myna Hidalgo at the start of cycle six. He is doing well.  Oncology Nurse Navigator Documentation     02/17/2023    9:00 AM  Oncology Nurse Navigator Flowsheets  Navigator Follow Up Date: 03/03/2023  Navigator Follow Up Reason: Follow-up Appointment;Chemotherapy  Navigator Location [facility]  Navigator Encounter Type Treatment;Appt/Treatment Plan Review  Patient Visit Type MedOnc  Treatment Phase Active Tx  Barriers/Navigation Needs Coordination of Care;Education;Financial Toxicity;Unemployed  Interventions Psycho-Social Support  Acuity Level 2-Minimal Needs (1-2 Barriers Identified)  Time Spent with Patient 15

## 2023-02-17 NOTE — Patient Instructions (Signed)
[facility]  Discharge Instructions: Thank you for choosing [facility] to provide your oncology and hematology care.   If you have a lab appointment with the Cancer Center, please go directly to the Cancer Center and check in at the registration area.  Wear comfortable clothing and clothing appropriate for easy access to any Portacath or PICC line.   We strive to give you quality time with your provider. You may need to reschedule your appointment if you arrive late (15 or more minutes).  Arriving late affects you and other patients whose appointments are after yours.  Also, if you miss three or more appointments without notifying the office, you may be dismissed from the clinic at the provider's discretion.      For prescription refill requests, have your pharmacy contact our office and allow 72 hours for refills to be completed.    Today you received the following chemotherapy and/or immunotherapy agents 5-fu. Leucovorin, oxaliplatin, cpt-11    To help prevent nausea and vomiting after your treatment, we encourage you to take your nausea medication as directed.  BELOW ARE SYMPTOMS THAT SHOULD BE REPORTED IMMEDIATELY: *FEVER GREATER THAN 100.4 F (38 C) OR HIGHER *CHILLS OR SWEATING *NAUSEA AND VOMITING THAT IS NOT CONTROLLED WITH YOUR NAUSEA MEDICATION *UNUSUAL SHORTNESS OF BREATH *UNUSUAL BRUISING OR BLEEDING *URINARY PROBLEMS (pain or burning when urinating, or frequent urination) *BOWEL PROBLEMS (unusual diarrhea, constipation, pain near the anus) TENDERNESS IN MOUTH AND THROAT WITH OR WITHOUT PRESENCE OF ULCERS (sore throat, sores in mouth, or a toothache) UNUSUAL RASH, SWELLING OR PAIN  UNUSUAL VAGINAL DISCHARGE OR ITCHING   Items with * indicate a potential emergency and should be followed up as soon as possible or go to the Emergency Department if any problems should occur.  Please show the CHEMOTHERAPY ALERT CARD or  IMMUNOTHERAPY ALERT CARD at check-in to the Emergency Department and triage nurse. Should you have questions after your visit or need to cancel or reschedule your appointment, please contact [facility]  [phone] and follow the prompts.  Office hours are 8:00 a.m. to 4:30 p.m. Monday - Friday. Please note that voicemails left after 4:00 p.m. may not be returned until the following business day.  We are closed weekends and major holidays. You have access to a nurse at all times for urgent questions. Please call the main number to the clinic [phone] and follow the prompts.  For any non-urgent questions, you may also contact your provider using MyChart. We now offer e-Visits for anyone 54 and older to request care online for non-urgent symptoms. For details visit [email or web address].   Also download the MyChart app! Go to the app store, search "MyChart", open the app, select [place], and log in with your MyChart username and password.

## 2023-02-19 ENCOUNTER — Inpatient Hospital Stay: Payer: Medicaid Other

## 2023-02-19 VITALS — BP 107/66 | HR 54 | Temp 98.0°F | Resp 18

## 2023-02-19 DIAGNOSIS — Z5111 Encounter for antineoplastic chemotherapy: Secondary | ICD-10-CM | POA: Diagnosis not present

## 2023-02-19 DIAGNOSIS — C189 Malignant neoplasm of colon, unspecified: Secondary | ICD-10-CM

## 2023-02-19 MED ORDER — PEGFILGRASTIM-CBQV 6 MG/0.6ML ~~LOC~~ SOSY
6.0000 mg | PREFILLED_SYRINGE | Freq: Once | SUBCUTANEOUS | Status: AC
  Administered 2023-02-19: 6 mg via SUBCUTANEOUS
  Filled 2023-02-19: qty 0.6

## 2023-02-19 MED ORDER — HEPARIN SOD (PORK) LOCK FLUSH 100 UNIT/ML IV SOLN
500.0000 [IU] | Freq: Once | INTRAVENOUS | Status: AC | PRN
  Administered 2023-02-19: 500 [IU]

## 2023-02-19 MED ORDER — SODIUM CHLORIDE 0.9% FLUSH
10.0000 mL | INTRAVENOUS | Status: DC | PRN
  Administered 2023-02-19: 10 mL

## 2023-02-19 NOTE — Patient Instructions (Signed)

## 2023-02-24 ENCOUNTER — Inpatient Hospital Stay: Payer: Medicaid Other | Admitting: Dietician

## 2023-02-24 ENCOUNTER — Telehealth: Payer: Self-pay | Admitting: Dietician

## 2023-02-24 ENCOUNTER — Other Ambulatory Visit: Payer: Self-pay

## 2023-02-24 MED ORDER — LORAZEPAM 0.5 MG PO TABS
0.5000 mg | ORAL_TABLET | Freq: Four times a day (QID) | ORAL | 0 refills | Status: DC | PRN
  Filled 2023-02-24: qty 60, 15d supply, fill #0

## 2023-02-24 MED ORDER — METHOCARBAMOL 500 MG PO TABS
500.0000 mg | ORAL_TABLET | Freq: Four times a day (QID) | ORAL | 0 refills | Status: DC | PRN
  Filled 2023-02-24 – 2023-02-26 (×2): qty 30, 8d supply, fill #0

## 2023-02-24 NOTE — Telephone Encounter (Signed)
Attempted to reach patient for a scheduled remote nutrition consult. Provided my cell# on voice mail of mobile to return call for his follow up nutrition consult.  Gennaro Africa, RDN, LDN Registered Dietitian, [facility] Part Time Remote (Usual office hours: Tuesday-Thursday) Cell: [phone]

## 2023-02-25 ENCOUNTER — Other Ambulatory Visit: Payer: Self-pay

## 2023-02-26 ENCOUNTER — Other Ambulatory Visit: Payer: Self-pay | Admitting: *Deleted

## 2023-02-26 ENCOUNTER — Other Ambulatory Visit (HOSPITAL_BASED_OUTPATIENT_CLINIC_OR_DEPARTMENT_OTHER): Payer: Self-pay

## 2023-02-26 ENCOUNTER — Other Ambulatory Visit: Payer: Self-pay

## 2023-02-26 ENCOUNTER — Encounter: Payer: Self-pay | Admitting: Hematology & Oncology

## 2023-02-26 DIAGNOSIS — C787 Secondary malignant neoplasm of liver and intrahepatic bile duct: Secondary | ICD-10-CM

## 2023-02-26 DIAGNOSIS — C189 Malignant neoplasm of colon, unspecified: Secondary | ICD-10-CM

## 2023-02-26 MED ORDER — LORAZEPAM 0.5 MG PO TABS
0.5000 mg | ORAL_TABLET | Freq: Four times a day (QID) | ORAL | 0 refills | Status: DC | PRN
Start: 2023-02-26 — End: 2023-04-21
  Filled 2023-02-26 (×2): qty 60, 15d supply, fill #0

## 2023-02-26 MED ORDER — DEXAMETHASONE 4 MG PO TABS
8.0000 mg | ORAL_TABLET | Freq: Every day | ORAL | 1 refills | Status: DC
Start: 2023-02-26 — End: 2023-05-17
  Filled 2023-02-26 (×2): qty 30, 15d supply, fill #0

## 2023-02-26 MED ORDER — ONDANSETRON HCL 8 MG PO TABS
8.0000 mg | ORAL_TABLET | Freq: Three times a day (TID) | ORAL | 2 refills | Status: DC | PRN
Start: 2023-02-26 — End: 2023-08-27
  Filled 2023-02-26 (×2): qty 30, 10d supply, fill #0

## 2023-03-03 ENCOUNTER — Telehealth: Payer: Self-pay

## 2023-03-03 ENCOUNTER — Inpatient Hospital Stay: Payer: Medicaid Other

## 2023-03-03 ENCOUNTER — Telehealth: Payer: Self-pay | Admitting: *Deleted

## 2023-03-03 ENCOUNTER — Inpatient Hospital Stay: Payer: Medicaid Other | Admitting: Hematology & Oncology

## 2023-03-03 NOTE — Telephone Encounter (Signed)
Patient did not show up for his appointments this morning. Tried to reach him by phone on all three phone numbers listed on the chart. There was no answer and no voicemail set up on two of them.  A gentleman answered when I called [phone] informing me that I've got the wrong number.

## 2023-03-03 NOTE — Telephone Encounter (Signed)
Per scheduling message Erie Noe - Called patient and lvm with new appointments and requested callback to confirm.

## 2023-03-05 ENCOUNTER — Inpatient Hospital Stay: Payer: Medicaid Other

## 2023-03-08 ENCOUNTER — Telehealth: Payer: Self-pay | Admitting: *Deleted

## 2023-03-08 NOTE — Telephone Encounter (Signed)
Per scheduling message Richard Bowman -  Patient canceled 7/3 appointment. Called and lvm for callback to schedule a nutrition follow up.

## 2023-03-10 ENCOUNTER — Inpatient Hospital Stay: Payer: Medicaid Other | Admitting: Licensed Clinical Social Worker

## 2023-03-10 ENCOUNTER — Encounter: Payer: Self-pay | Admitting: *Deleted

## 2023-03-10 ENCOUNTER — Inpatient Hospital Stay: Payer: Medicaid Other

## 2023-03-10 ENCOUNTER — Encounter: Payer: Self-pay | Admitting: Hematology & Oncology

## 2023-03-10 ENCOUNTER — Inpatient Hospital Stay: Payer: Medicaid Other | Attending: Hematology & Oncology

## 2023-03-10 ENCOUNTER — Inpatient Hospital Stay (HOSPITAL_BASED_OUTPATIENT_CLINIC_OR_DEPARTMENT_OTHER): Payer: Medicaid Other | Admitting: Hematology & Oncology

## 2023-03-10 VITALS — BP 118/70 | HR 62 | Temp 98.2°F | Resp 20 | Ht 70.0 in | Wt 190.1 lb

## 2023-03-10 DIAGNOSIS — Z5111 Encounter for antineoplastic chemotherapy: Secondary | ICD-10-CM | POA: Insufficient documentation

## 2023-03-10 DIAGNOSIS — C189 Malignant neoplasm of colon, unspecified: Secondary | ICD-10-CM

## 2023-03-10 DIAGNOSIS — C787 Secondary malignant neoplasm of liver and intrahepatic bile duct: Secondary | ICD-10-CM

## 2023-03-10 DIAGNOSIS — C7800 Secondary malignant neoplasm of unspecified lung: Secondary | ICD-10-CM | POA: Diagnosis not present

## 2023-03-10 DIAGNOSIS — C187 Malignant neoplasm of sigmoid colon: Secondary | ICD-10-CM | POA: Diagnosis present

## 2023-03-10 DIAGNOSIS — Z79899 Other long term (current) drug therapy: Secondary | ICD-10-CM | POA: Diagnosis not present

## 2023-03-10 DIAGNOSIS — D509 Iron deficiency anemia, unspecified: Secondary | ICD-10-CM | POA: Diagnosis not present

## 2023-03-10 DIAGNOSIS — Z5189 Encounter for other specified aftercare: Secondary | ICD-10-CM | POA: Insufficient documentation

## 2023-03-10 LAB — IRON AND IRON BINDING CAPACITY (CC-WL,HP ONLY)
Iron: 58 ug/dL (ref 45–182)
Saturation Ratios: 16 % — ABNORMAL LOW (ref 17.9–39.5)
TIBC: 365 ug/dL (ref 250–450)
UIBC: 307 ug/dL (ref 117–376)

## 2023-03-10 LAB — CMP (CANCER CENTER ONLY)
ALT: 43 U/L (ref 0–44)
AST: 44 U/L — ABNORMAL HIGH (ref 15–41)
Albumin: 3.6 g/dL (ref 3.5–5.0)
Alkaline Phosphatase: 279 U/L — ABNORMAL HIGH (ref 38–126)
Anion gap: 5 (ref 5–15)
BUN: 11 mg/dL (ref 6–20)
CO2: 29 mmol/L (ref 22–32)
Calcium: 9.3 mg/dL (ref 8.9–10.3)
Chloride: 107 mmol/L (ref 98–111)
Creatinine: 0.92 mg/dL (ref 0.61–1.24)
GFR, Estimated: >60 mL/min (ref >60)
Glucose, Bld: 154 mg/dL — ABNORMAL HIGH (ref 70–99)
Potassium: 3.7 mmol/L (ref 3.5–5.1)
Sodium: 141 mmol/L (ref 135–145)
Total Bilirubin: 0.4 mg/dL (ref 0.3–1.2)
Total Protein: 6.6 g/dL (ref 6.5–8.1)

## 2023-03-10 LAB — CBC WITH DIFFERENTIAL (CANCER CENTER ONLY)
Abs Immature Granulocytes: 0.02 10*3/uL (ref 0.00–0.07)
Basophils Absolute: 0 10*3/uL (ref 0.0–0.1)
Basophils Relative: 1 %
Eosinophils Absolute: 0.2 10*3/uL (ref 0.0–0.5)
Eosinophils Relative: 4 %
HCT: 36.2 % — ABNORMAL LOW (ref 39.0–52.0)
Hemoglobin: 11.7 g/dL — ABNORMAL LOW (ref 13.0–17.0)
Immature Granulocytes: 0 %
Lymphocytes Relative: 32 %
Lymphs Abs: 1.7 10*3/uL (ref 0.7–4.0)
MCH: 29.5 pg (ref 26.0–34.0)
MCHC: 32.3 g/dL (ref 30.0–36.0)
MCV: 91.2 fL (ref 80.0–100.0)
Monocytes Absolute: 0.6 10*3/uL (ref 0.1–1.0)
Monocytes Relative: 11 %
Neutro Abs: 2.8 10*3/uL (ref 1.7–7.7)
Neutrophils Relative %: 52 %
Platelet Count: 85 10*3/uL — ABNORMAL LOW (ref 150–400)
RBC: 3.97 MIL/uL — ABNORMAL LOW (ref 4.22–5.81)
RDW: 18.8 % — ABNORMAL HIGH (ref 11.5–15.5)
WBC Count: 5.4 10*3/uL (ref 4.0–10.5)
nRBC: 0 % (ref 0.0–0.2)

## 2023-03-10 LAB — FERRITIN: Ferritin: 156 ng/mL (ref 24–336)

## 2023-03-10 LAB — CEA (IN HOUSE-CHCC): CEA (CHCC-In House): 16.5 ng/mL — ABNORMAL HIGH (ref 0.00–5.00)

## 2023-03-10 LAB — LACTATE DEHYDROGENASE: LDH: 143 U/L (ref 98–192)

## 2023-03-10 MED ORDER — SODIUM CHLORIDE 0.9 % IV SOLN
150.0000 mg | Freq: Once | INTRAVENOUS | Status: AC
  Administered 2023-03-10: 150 mg via INTRAVENOUS
  Filled 2023-03-10: qty 150

## 2023-03-10 MED ORDER — OXALIPLATIN CHEMO INJECTION 100 MG/20ML
85.0000 mg/m2 | Freq: Once | INTRAVENOUS | Status: AC
  Administered 2023-03-10: 175 mg via INTRAVENOUS
  Filled 2023-03-10: qty 35

## 2023-03-10 MED ORDER — LEUCOVORIN CALCIUM INJECTION 350 MG
400.0000 mg/m2 | Freq: Once | INTRAVENOUS | Status: AC
  Administered 2023-03-10: 824 mg via INTRAVENOUS
  Filled 2023-03-10: qty 41.2

## 2023-03-10 MED ORDER — HEPARIN SOD (PORK) LOCK FLUSH 100 UNIT/ML IV SOLN: 500.0000 [IU] | Freq: Once | INTRAVENOUS | Status: DC | PRN

## 2023-03-10 MED ORDER — DEXTROSE 5 % IV SOLN: Freq: Once | INTRAVENOUS | Status: AC

## 2023-03-10 MED ORDER — SODIUM CHLORIDE 0.9% FLUSH: 10.0000 mL | INTRAVENOUS | Status: DC | PRN

## 2023-03-10 MED ORDER — SODIUM CHLORIDE 0.9 % IV SOLN
2400.0000 mg/m2 | INTRAVENOUS | Status: DC
  Administered 2023-03-10: 5000 mg via INTRAVENOUS
  Filled 2023-03-10: qty 100

## 2023-03-10 MED ORDER — ATROPINE SULFATE 1 MG/ML IV SOLN
0.5000 mg | Freq: Once | INTRAVENOUS | Status: AC | PRN
  Administered 2023-03-10: 0.5 mg via INTRAVENOUS
  Filled 2023-03-10: qty 1

## 2023-03-10 MED ORDER — SODIUM CHLORIDE 0.9 % IV SOLN
10.0000 mg | Freq: Once | INTRAVENOUS | Status: AC
  Administered 2023-03-10: 10 mg via INTRAVENOUS
  Filled 2023-03-10: qty 10

## 2023-03-10 MED ORDER — SODIUM CHLORIDE 0.9 % IV SOLN
165.0000 mg/m2 | Freq: Once | INTRAVENOUS | Status: AC
  Administered 2023-03-10: 340 mg via INTRAVENOUS
  Filled 2023-03-10: qty 15

## 2023-03-10 MED ORDER — PALONOSETRON HCL INJECTION 0.25 MG/5ML
0.2500 mg | Freq: Once | INTRAVENOUS | Status: AC
  Administered 2023-03-10: 0.25 mg via INTRAVENOUS
  Filled 2023-03-10: qty 5

## 2023-03-10 NOTE — Progress Notes (Signed)
Hematology and Oncology Follow Up Visit  Richard Bowman [id number] 11-22-1975 47 y.o. 03/10/2023   Principle Diagnosis:  Metastatic adenocarcinoma of the colon-liver metastasis -- pMMR/MSI-low -- (+) KRAS/ ERBB2(+)       Iron deficiency anemia Current Therapy:   FOLFOXIRI - s/p cycle #5  - start on  -- 12/09/2022 IV iron as indicated     Interim History:  Richard Bowman is back for follow-up.  He continues to improve.  He did have a CT scan that was done on 02/15/2023.  This showed a very nice response with decrease in hepatic and pulmonary metastasis.  His thoracic adenopathy had resolved.  His CEA is also responded well.  The last CEA that we got him back in May was 16.  He does have some slight abdominal pain.  This certainly could be from where he had his surgery.  He is going to the bathroom.  I told to make sure that he takes a laxative every day so that he goes every day.  He is eating well.  His weight is doing nicely.  He has had no problems with nausea or vomiting.  On occasion he does have little bit of vomiting.  He says he is started taking antinausea medicine a couple days before he starts his chemotherapy.  He has had no leg swelling.  There is been no rashes.  He has had no fever.  Overall, I would say that his performance status is probably ECOG 1.   Medications:  Current Outpatient Medications:    acetaminophen (TYLENOL) 500 MG tablet, Take 2 tablets (1,000 mg total) by mouth every 6 (six) hours as needed for mild pain., Disp: 30 tablet, Rfl: 0   dexamethasone (DECADRON) 4 MG tablet, Take 2 tablets (8 mg total) by mouth daily for 3 days, starting the day after chemotherapy. Take with food., Disp: 30 tablet, Rfl: 1   docusate sodium (COLACE) 100 MG capsule, Take 1 capsule (100 mg total) by mouth 2 (two) times daily. (Patient taking differently: Take 100 mg by mouth daily.), Disp: 10 capsule, Rfl: 0   feeding supplement (ENSURE ENLIVE / ENSURE PLUS) LIQD, Take 237 mLs by  mouth 2 (two) times daily between meals., Disp: 237 mL, Rfl: 12   lactose free nutrition (BOOST) LIQD, Take 237 mLs by mouth daily as needed (Supplement)., Disp: , Rfl:    lactulose (CHRONULAC) 10 GM/15ML solution, Take 15 mLs (10 g total) by mouth 3 (three) times daily., Disp: 236 mL, Rfl: 0   lidocaine-prilocaine (EMLA) cream, Apply 1 Application topically as needed., Disp: 30 g, Rfl: 0   loperamide (IMODIUM A-D) 2 MG tablet, Take 2 tabs by mouth with first loose stool, then 1 tablet with each additional loose stool as needed. Do not exceed 8 tablets in a 24-hour period., Disp: 100 tablet, Rfl: 3   loratadine (CLARITIN) 10 MG tablet, Take 10 mg by mouth. Takes day of injection-Udenyca, post chemotherapy., Disp: , Rfl:    LORazepam (ATIVAN) 0.5 MG tablet, Place 1 tablet (0.5 mg total) under the tongue every 6 (six) hours as needed for anxiety., Disp: 60 tablet, Rfl: 0   methocarbamol (ROBAXIN) 500 MG tablet, Take 1 tablet (500 mg total) by mouth every 6 (six) hours as needed for muscle spasms., Disp: 30 tablet, Rfl: 0   nicotine (NICODERM CQ - DOSED IN MG/24 HOURS) 21 mg/24hr patch, Place 1 patch (21 mg total) onto the skin daily., Disp: 30 patch, Rfl: 1   nystatin (MYCOSTATIN) 100000  UNIT/ML suspension, Take 5 mLs (500,000 Units total) by mouth 4 (four) times daily., Disp: 473 mL, Rfl: 1   ondansetron (ZOFRAN) 8 MG tablet, Take 1 tablet (8 mg total) by mouth every 8 (eight) hours as needed for nausea or vomiting. Start on the third day after cisplatin., Disp: 30 tablet, Rfl: 2   oxyCODONE (OXY IR/ROXICODONE) 5 MG immediate release tablet, Take 1 tablet (5 mg total) by mouth every 6 (six) hours as needed for severe pain., Disp: 60 tablet, Rfl: 0   pantoprazole (PROTONIX) 40 MG tablet, Take 1 tablet (40 mg total) by mouth 2 (two) times daily., Disp: 60 tablet, Rfl: 6   polyethylene glycol (MIRALAX / GLYCOLAX) 17 g packet, Take 17 g by mouth daily as needed for mild constipation., Disp: 14 each, Rfl: 0    prochlorperazine (COMPAZINE) 10 MG tablet, Take 1 tablet (10 mg total) by mouth every 6 (six) hours as needed for nausea or vomiting., Disp: 30 tablet, Rfl: 3  Allergies: No Known Allergies  Past Medical History, Surgical history, Social history, and Family History were reviewed and updated.  Review of Systems: Review of Systems  Constitutional: Negative.   HENT:  Negative.    Eyes: Negative.   Respiratory: Negative.    Cardiovascular: Negative.   Gastrointestinal:  Positive for abdominal pain and constipation.  Endocrine: Negative.   Genitourinary: Negative.    Musculoskeletal: Negative.   Skin: Negative.   Neurological: Negative.   Hematological: Negative.   Psychiatric/Behavioral: Negative.      Physical Exam: His vital signs show temperature of 98.  Pulse 69.  Blood pressure 114/71.  Weight is 182 pounds.  Wt Readings from Last 3 Encounters:  01/27/23 182 lb (82.6 kg)  01/12/23 173 lb (78.5 kg)  12/29/22 180 lb (81.6 kg)    Physical Exam Vitals reviewed.  HENT:     Head: Normocephalic and atraumatic.  Eyes:     Pupils: Pupils are equal, round, and reactive to light.  Cardiovascular:     Rate and Rhythm: Normal rate and regular rhythm.     Heart sounds: Normal heart sounds.  Pulmonary:     Effort: Pulmonary effort is normal.     Breath sounds: Normal breath sounds.  Abdominal:     General: Bowel sounds are normal.     Palpations: Abdomen is soft.     Comments: Abdominal exam shows a well-healed laparotomy scar.  He has no fluid wave.  There is no guarding or rebound tenderness.  He does have hepatomegaly with his liver edge about 7-8 cm below the right costal margin.  There is no splenomegaly.  Musculoskeletal:        General: No tenderness or deformity. Normal range of motion.     Cervical back: Normal range of motion.  Lymphadenopathy:     Cervical: No cervical adenopathy.  Skin:    General: Skin is warm and dry.     Findings: No erythema or rash.   Neurological:     Mental Status: He is alert and oriented to person, place, and time.  Psychiatric:        Behavior: Behavior normal.        Thought Content: Thought content normal.        Judgment: Judgment normal.     Lab Results  Component Value Date   WBC 8.7 02/17/2023   HGB 11.5 (L) 02/17/2023   HCT 36.4 (L) 02/17/2023   MCV 90.1 02/17/2023   PLT 146 (L) 02/17/2023  Chemistry      Component Value Date/Time   NA 140 02/17/2023 0842   K 3.7 02/17/2023 0842   CL 105 02/17/2023 0842   CO2 29 02/17/2023 0842   BUN 15 02/17/2023 0842   CREATININE 0.96 02/17/2023 0842      Component Value Date/Time   CALCIUM 9.6 02/17/2023 0842   ALKPHOS 247 (H) 02/17/2023 0842   AST 28 02/17/2023 0842   ALT 24 02/17/2023 0842   BILITOT 0.3 02/17/2023 0842       Impression and Plan: Mr. Abernathy is a very nice 47 year old white male.  He has really no past medical history.  He came to the hospital with abdominal pain.  He was found to have metastatic colon cancer.  We will go ahead with the 6th cycle of chemotherapy.  He is K-ras mutated.  As such, we cannot use EGFR inhibitors.  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  As such, we might be able to utilize anti-HER2 therapy in the future.  I still would just go with FOLFOXIRI.  I do not think we need to add Avastin right now.  I would probably continue him for at least 8 cycles.  After the eighth cycle, we will then plan for a follow-up CT scan and see everything looks.  After that, we might consider putting him on a maintenance protocol with possibly Xeloda and Avastin.   Josph Macho, MD 7/10/20248:28 AM

## 2023-03-10 NOTE — Progress Notes (Signed)
[facility] CSW Progress Note  Visual merchandiser met with patient to assess needs.  He has moved into his own apartment.  His disability, Medicaid and food stamps are in place.  He has been working with Mady Haagensen, Artist.  He reports sleeping a lot and putting off his obligations.  Provided active listening and supportive counseling.    Dorothey Baseman, LCSW Clinical Social Worker [facility]    Patient is participating in a Managed Medicaid Plan:  Yes

## 2023-03-10 NOTE — Progress Notes (Signed)
Patient cleared for cycle six of therapy. Scans not needed until after eighth cycle.   Oncology Nurse Navigator Documentation     03/10/2023    8:30 AM  Oncology Nurse Navigator Flowsheets  Navigator Follow Up Date: 03/23/2023  Navigator Follow Up Reason: Follow-up Appointment;Chemotherapy  Navigator Location [facility]  Navigator Encounter Type Treatment;Appt/Treatment Plan Review  Patient Visit Type MedOnc  Treatment Phase Active Tx  Barriers/Navigation Needs No Barriers At This Time  Interventions Psycho-Social Support  Acuity Level 1-No Barriers  Time Spent with Patient 15

## 2023-03-10 NOTE — Progress Notes (Signed)
MD reviewed cbc and cmet. VO " ok to treat despite counts"

## 2023-03-10 NOTE — Patient Instructions (Signed)

## 2023-03-11 ENCOUNTER — Telehealth: Payer: Self-pay

## 2023-03-11 ENCOUNTER — Telehealth: Payer: Self-pay | Admitting: *Deleted

## 2023-03-11 ENCOUNTER — Other Ambulatory Visit: Payer: Self-pay | Admitting: Medical Oncology

## 2023-03-11 NOTE — Telephone Encounter (Signed)
Per scheduling message Dahlia Client - Called patient and lvm for a callback to schedule (1) dose of IV Iron.

## 2023-03-11 NOTE — Telephone Encounter (Signed)
-----   Message from Josph Macho sent at 03/10/2023  3:33 PM EDT ----- Please call let him know that the tumor marker actually went down to 16 from 64.  His iron is on the low side.  He really needs to have a dose of IV iron when he comes back for pump DC.  Please make sure he gets iron.  Thanks.  Cindee Lame

## 2023-03-11 NOTE — Telephone Encounter (Signed)
Advised via MyChart.

## 2023-03-12 ENCOUNTER — Inpatient Hospital Stay: Payer: Medicaid Other

## 2023-03-12 ENCOUNTER — Inpatient Hospital Stay: Payer: Medicaid Other | Admitting: Family

## 2023-03-12 ENCOUNTER — Other Ambulatory Visit (HOSPITAL_BASED_OUTPATIENT_CLINIC_OR_DEPARTMENT_OTHER): Payer: Self-pay

## 2023-03-12 VITALS — BP 102/60 | HR 50 | Temp 98.4°F | Resp 18

## 2023-03-12 DIAGNOSIS — Z5111 Encounter for antineoplastic chemotherapy: Secondary | ICD-10-CM | POA: Diagnosis not present

## 2023-03-12 DIAGNOSIS — C189 Malignant neoplasm of colon, unspecified: Secondary | ICD-10-CM

## 2023-03-12 MED ORDER — OXYCODONE HCL 5 MG PO TABS
5.0000 mg | ORAL_TABLET | Freq: Four times a day (QID) | ORAL | 0 refills | Status: DC | PRN
Start: 2023-03-12 — End: 2023-04-01
  Filled 2023-03-12: qty 60, 15d supply, fill #0

## 2023-03-12 MED ORDER — PEGFILGRASTIM-CBQV 6 MG/0.6ML ~~LOC~~ SOSY
6.0000 mg | PREFILLED_SYRINGE | Freq: Once | SUBCUTANEOUS | Status: AC
  Administered 2023-03-12: 6 mg via SUBCUTANEOUS

## 2023-03-12 MED ORDER — HEPARIN SOD (PORK) LOCK FLUSH 100 UNIT/ML IV SOLN
500.0000 [IU] | Freq: Once | INTRAVENOUS | Status: AC | PRN
  Administered 2023-03-12: 500 [IU]

## 2023-03-12 MED ORDER — OXYCODONE HCL 5 MG PO TABS
5.0000 mg | ORAL_TABLET | Freq: Four times a day (QID) | ORAL | 0 refills | Status: DC | PRN
Start: 2023-03-12 — End: 2023-03-12
  Filled 2023-03-12: qty 60, 15d supply, fill #0

## 2023-03-12 MED ORDER — SODIUM CHLORIDE 0.9% FLUSH
10.0000 mL | INTRAVENOUS | Status: DC | PRN
  Administered 2023-03-12: 10 mL

## 2023-03-12 NOTE — Patient Instructions (Signed)

## 2023-03-12 NOTE — Addendum Note (Signed)
Addended by: Arlan Organ R on: 03/12/2023 02:57 PM   Modules accepted: Orders

## 2023-03-15 ENCOUNTER — Encounter: Payer: Self-pay | Admitting: Hematology & Oncology

## 2023-03-15 ENCOUNTER — Inpatient Hospital Stay: Payer: Medicaid Other

## 2023-03-15 VITALS — BP 105/56 | HR 56 | Temp 97.9°F | Resp 17

## 2023-03-15 DIAGNOSIS — C189 Malignant neoplasm of colon, unspecified: Secondary | ICD-10-CM

## 2023-03-15 DIAGNOSIS — Z5111 Encounter for antineoplastic chemotherapy: Secondary | ICD-10-CM | POA: Diagnosis not present

## 2023-03-15 MED ORDER — SODIUM CHLORIDE 0.9 % IV SOLN
1000.0000 mg | Freq: Once | INTRAVENOUS | Status: AC
  Administered 2023-03-15: 1000 mg via INTRAVENOUS
  Filled 2023-03-15: qty 10

## 2023-03-15 MED ORDER — SODIUM CHLORIDE 0.9 % IV SOLN: Freq: Once | INTRAVENOUS | Status: AC

## 2023-03-15 MED ORDER — ONDANSETRON HCL 4 MG/2ML IJ SOLN
8.0000 mg | Freq: Once | INTRAMUSCULAR | Status: AC
  Administered 2023-03-15: 8 mg via INTRAVENOUS
  Filled 2023-03-15: qty 4

## 2023-03-15 MED ORDER — SODIUM CHLORIDE 0.9% FLUSH
10.0000 mL | Freq: Once | INTRAVENOUS | Status: AC | PRN
  Administered 2023-03-15: 10 mL

## 2023-03-15 MED ORDER — LORATADINE 10 MG PO TABS
10.0000 mg | ORAL_TABLET | Freq: Once | ORAL | Status: DC
  Filled 2023-03-15: qty 1

## 2023-03-15 MED ORDER — HEPARIN SOD (PORK) LOCK FLUSH 100 UNIT/ML IV SOLN
500.0000 [IU] | Freq: Once | INTRAVENOUS | Status: AC | PRN
  Administered 2023-03-15: 500 [IU]

## 2023-03-15 NOTE — Patient Instructions (Signed)
Ferric Derisomaltose Injection What is this medication? FERRIC DERISOMALTOSE (FER ik der EYE soe MAWL tose) treats low levels of iron in your body (iron deficiency anemia). Iron is a mineral that plays an important role in making red blood cells, which carry oxygen from your lungs to the rest of your body. This medicine may be used for other purposes; ask your health care provider or pharmacist if you have questions. COMMON BRAND NAME(S): MONOFERRIC What should I tell my care team before I take this medication? They need to know if you have any of these conditions: High levels of iron in the blood An unusual or allergic reaction to iron, other medications, foods, dyes, or preservatives Pregnant or trying to get pregnant Breastfeeding How should I use this medication? This medication is injected into a vein. It is given by your care team in a hospital or clinic setting. Talk to your care team about the use of this medication in children. Special care may be needed. Overdosage: If you think you have taken too much of this medicine contact a poison control center or emergency room at once. NOTE: This medicine is only for you. Do not share this medicine with others. What if I miss a dose? It is important not to miss your dose. Call your care team if you are unable to keep an appointment. What may interact with this medication? Do not take this medication with any of the following: Deferoxamine Dimercaprol Other iron products This list may not describe all possible interactions. Give your health care provider a list of all the medicines, herbs, non-prescription drugs, or dietary supplements you use. Also tell them if you smoke, drink alcohol, or use illegal drugs. Some items may interact with your medicine. What should I watch for while using this medication? Visit your care team regularly. Tell your care team if your symptoms do not start to get better or if they get worse. You may need blood  work done while you are taking this medication. You may need to follow a special diet. Talk to your care team. Foods that contain iron include whole grains/cereals, dried fruits, beans, or peas, leafy green vegetables, and organ meats (liver, kidney). What side effects may I notice from receiving this medication? Side effects that you should report to your care team as soon as possible: Allergic reactions--skin rash, itching, hives, swelling of the face, lips, tongue, or throat Low blood pressure--dizziness, feeling faint or lightheaded, blurry vision Shortness of breath Side effects that usually do not require medical attention (report to your care team if they continue or are bothersome): Flushing Headache Joint pain Muscle pain Nausea Pain, redness, or irritation at injection site This list may not describe all possible side effects. Call your doctor for medical advice about side effects. You may report side effects to FDA at 1-800-FDA-1088. Where should I keep my medication? This medication is given in a hospital or clinic and will not be stored at home. NOTE: This sheet is a summary. It may not cover all possible information. If you have questions about this medicine, talk to your doctor, pharmacist, or health care provider.  2024 Elsevier/Gold Standard (2022-02-23 00:00:00)  

## 2023-03-22 ENCOUNTER — Other Ambulatory Visit: Payer: Self-pay | Admitting: Medical Oncology

## 2023-03-22 DIAGNOSIS — C189 Malignant neoplasm of colon, unspecified: Secondary | ICD-10-CM

## 2023-03-23 ENCOUNTER — Inpatient Hospital Stay: Payer: Medicaid Other

## 2023-03-23 ENCOUNTER — Inpatient Hospital Stay: Payer: Medicaid Other | Admitting: Medical Oncology

## 2023-03-25 ENCOUNTER — Inpatient Hospital Stay: Payer: Medicaid Other

## 2023-03-25 ENCOUNTER — Telehealth: Payer: Self-pay | Admitting: *Deleted

## 2023-03-25 NOTE — Telephone Encounter (Signed)
Per scheduling message Asher Muir - Called patient and lvm for a callback to reschedule missed appointments.

## 2023-03-26 ENCOUNTER — Telehealth: Payer: Self-pay | Admitting: *Deleted

## 2023-03-26 NOTE — Telephone Encounter (Signed)
Per scheduling message Richard Bowman - Called patient again and lvm for a call back to reschedule missed appointments.

## 2023-03-30 ENCOUNTER — Inpatient Hospital Stay: Payer: Medicaid Other

## 2023-03-30 ENCOUNTER — Other Ambulatory Visit: Payer: Self-pay

## 2023-03-30 ENCOUNTER — Encounter: Payer: Self-pay | Admitting: Medical Oncology

## 2023-03-30 ENCOUNTER — Inpatient Hospital Stay (HOSPITAL_BASED_OUTPATIENT_CLINIC_OR_DEPARTMENT_OTHER): Payer: Medicaid Other | Admitting: Medical Oncology

## 2023-03-30 VITALS — BP 110/78 | HR 61 | Temp 98.4°F | Resp 18 | Ht 70.0 in | Wt 190.4 lb

## 2023-03-30 DIAGNOSIS — C189 Malignant neoplasm of colon, unspecified: Secondary | ICD-10-CM

## 2023-03-30 DIAGNOSIS — C787 Secondary malignant neoplasm of liver and intrahepatic bile duct: Secondary | ICD-10-CM

## 2023-03-30 DIAGNOSIS — Z5111 Encounter for antineoplastic chemotherapy: Secondary | ICD-10-CM | POA: Diagnosis not present

## 2023-03-30 LAB — CBC WITH DIFFERENTIAL (CANCER CENTER ONLY)
Abs Immature Granulocytes: 0.03 10*3/uL (ref 0.00–0.07)
Basophils Absolute: 0 10*3/uL (ref 0.0–0.1)
Basophils Relative: 1 %
Eosinophils Absolute: 0.2 10*3/uL (ref 0.0–0.5)
Eosinophils Relative: 4 %
HCT: 41.9 % (ref 39.0–52.0)
Hemoglobin: 13.4 g/dL (ref 13.0–17.0)
Immature Granulocytes: 1 %
Lymphocytes Relative: 25 %
Lymphs Abs: 1.5 10*3/uL (ref 0.7–4.0)
MCH: 30.2 pg (ref 26.0–34.0)
MCHC: 32 g/dL (ref 30.0–36.0)
MCV: 94.6 fL (ref 80.0–100.0)
Monocytes Absolute: 0.7 10*3/uL (ref 0.1–1.0)
Monocytes Relative: 11 %
Neutro Abs: 3.7 10*3/uL (ref 1.7–7.7)
Neutrophils Relative %: 58 %
Platelet Count: 94 10*3/uL — ABNORMAL LOW (ref 150–400)
RBC: 4.43 MIL/uL (ref 4.22–5.81)
RDW: 19.6 % — ABNORMAL HIGH (ref 11.5–15.5)
WBC Count: 6.2 10*3/uL (ref 4.0–10.5)
nRBC: 0 % (ref 0.0–0.2)

## 2023-03-30 LAB — CMP (CANCER CENTER ONLY)
ALT: 58 U/L — ABNORMAL HIGH (ref 0–44)
AST: 46 U/L — ABNORMAL HIGH (ref 15–41)
Albumin: 3.7 g/dL (ref 3.5–5.0)
Alkaline Phosphatase: 252 U/L — ABNORMAL HIGH (ref 38–126)
Anion gap: 7 (ref 5–15)
BUN: 11 mg/dL (ref 6–20)
CO2: 28 mmol/L (ref 22–32)
Calcium: 9.3 mg/dL (ref 8.9–10.3)
Chloride: 108 mmol/L (ref 98–111)
Creatinine: 0.9 mg/dL (ref 0.61–1.24)
GFR, Estimated: >60 mL/min (ref >60)
Glucose, Bld: 120 mg/dL — ABNORMAL HIGH (ref 70–99)
Potassium: 3.7 mmol/L (ref 3.5–5.1)
Sodium: 143 mmol/L (ref 135–145)
Total Bilirubin: 0.4 mg/dL (ref 0.3–1.2)
Total Protein: 6.9 g/dL (ref 6.5–8.1)

## 2023-03-30 LAB — IRON AND IRON BINDING CAPACITY (CC-WL,HP ONLY)
Iron: 124 ug/dL (ref 45–182)
Saturation Ratios: 38 % (ref 17.9–39.5)
TIBC: 325 ug/dL (ref 250–450)
UIBC: 201 ug/dL (ref 117–376)

## 2023-03-30 LAB — FERRITIN: Ferritin: 608 ng/mL — ABNORMAL HIGH (ref 24–336)

## 2023-03-30 LAB — LACTATE DEHYDROGENASE: LDH: 129 U/L (ref 98–192)

## 2023-03-30 MED ORDER — OXALIPLATIN CHEMO INJECTION 100 MG/20ML: 68.0000 mg/m2 | Freq: Once | INTRAVENOUS | Status: DC

## 2023-03-30 MED ORDER — SODIUM CHLORIDE 0.9% FLUSH
10.0000 mL | Freq: Once | INTRAVENOUS | Status: AC
  Administered 2023-03-30: 10 mL via INTRAVENOUS

## 2023-03-30 MED ORDER — DEXTROSE 5 % IV SOLN: Freq: Once | INTRAVENOUS | Status: DC

## 2023-03-30 MED ORDER — SODIUM CHLORIDE 0.9 % IV SOLN
10.0000 mg | Freq: Once | INTRAVENOUS | Status: DC
  Filled 2023-03-30: qty 1

## 2023-03-30 MED ORDER — PALONOSETRON HCL INJECTION 0.25 MG/5ML: 0.2500 mg | Freq: Once | INTRAVENOUS | Status: DC

## 2023-03-30 MED ORDER — SODIUM CHLORIDE 0.9 % IV SOLN: 132.0000 mg/m2 | Freq: Once | INTRAVENOUS | Status: DC

## 2023-03-30 MED ORDER — HEPARIN SOD (PORK) LOCK FLUSH 100 UNIT/ML IV SOLN
500.0000 [IU] | Freq: Once | INTRAVENOUS | Status: AC
  Administered 2023-03-30: 500 [IU] via INTRAVENOUS

## 2023-03-30 MED ORDER — LEUCOVORIN CALCIUM INJECTION 350 MG: 400.0000 mg/m2 | Freq: Once | INTRAVENOUS | Status: DC

## 2023-03-30 MED ORDER — SODIUM CHLORIDE 0.9 % IV SOLN
150.0000 mg | Freq: Once | INTRAVENOUS | Status: DC
  Filled 2023-03-30: qty 5

## 2023-03-30 MED ORDER — ATROPINE SULFATE 1 MG/ML IV SOLN: 0.5000 mg | Freq: Once | INTRAVENOUS | Status: DC | PRN

## 2023-03-30 MED ORDER — SODIUM CHLORIDE 0.9 % IV SOLN: 2400.0000 mg/m2 | INTRAVENOUS | Status: DC

## 2023-03-30 NOTE — Progress Notes (Signed)
Reviewed labs with Dr. Myna Hidalgo and pt ok to treat with platelets 94.  Upon starting treatment pt stated he had an appointment in [place] at 5 pm and didn't want to chance being late for it. Pt asked to reschedule his chemo infusion for next week. Dr.Ennever aware and ok to use lab values from today for treatment on 04/05/2023.  Pt aware of new appointment dates. Pt appreciative of help.

## 2023-03-30 NOTE — Patient Instructions (Addendum)
[facility]  Discharge Instructions: Thank you for choosing [facility] to provide your oncology and hematology care.   If you have a lab appointment with the Cancer Center, please go directly to the Cancer Center and check in at the registration area.  Wear comfortable clothing and clothing appropriate for easy access to any Portacath or PICC line.   We strive to give you quality time with your provider. You may need to reschedule your appointment if you arrive late (15 or more minutes).  Arriving late affects you and other patients whose appointments are after yours.  Also, if you miss three or more appointments without notifying the office, you may be dismissed from the clinic at the provider's discretion.      For prescription refill requests, have your pharmacy contact our office and allow 72 hours for refills to be completed.    Today you received the following chemotherapy and/or immunotherapy agents Oxaliplatin, Irinotecan, Leucovorin and Adrucil   To help prevent nausea and vomiting after your treatment, we encourage you to take your nausea medication as directed.  BELOW ARE SYMPTOMS THAT SHOULD BE REPORTED IMMEDIATELY: *FEVER GREATER THAN 100.4 F (38 C) OR HIGHER *CHILLS OR SWEATING *NAUSEA AND VOMITING THAT IS NOT CONTROLLED WITH YOUR NAUSEA MEDICATION *UNUSUAL SHORTNESS OF BREATH *UNUSUAL BRUISING OR BLEEDING *URINARY PROBLEMS (pain or burning when urinating, or frequent urination) *BOWEL PROBLEMS (unusual diarrhea, constipation, pain near the anus) TENDERNESS IN MOUTH AND THROAT WITH OR WITHOUT PRESENCE OF ULCERS (sore throat, sores in mouth, or a toothache) UNUSUAL RASH, SWELLING OR PAIN  UNUSUAL VAGINAL DISCHARGE OR ITCHING   Items with * indicate a potential emergency and should be followed up as soon as possible or go to the Emergency Department if any problems should occur.  Please show the CHEMOTHERAPY ALERT CARD or  IMMUNOTHERAPY ALERT CARD at check-in to the Emergency Department and triage nurse. Should you have questions after your visit or need to cancel or reschedule your appointment, please contact [facility] [place]  [phone] and follow the prompts.  Office hours are 8:00 a.m. to 4:30 p.m. Monday - Friday. Please note that voicemails left after 4:00 p.m. may not be returned until the following business day.  We are closed weekends and major holidays. You have access to a nurse at all times for urgent questions. Please call the main number to the clinic [phone] and follow the prompts.  For any non-urgent questions, you may also contact your provider using MyChart. We now offer e-Visits for anyone 26 and older to request care online for non-urgent symptoms. For details visit [email or web address].   Also download the MyChart app! Go to the app store, search "MyChart", open the app, select [place], and log in with your MyChart username and password.

## 2023-03-30 NOTE — Patient Instructions (Signed)

## 2023-03-30 NOTE — Progress Notes (Unsigned)
Hematology and Oncology Follow Up Visit  Richard Bowman [id number] [birth year] 47 y.o. 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  Last imaging- CT scan -completed on 02/15/2023 showing a nice response to treatment with decrease in hepatic and pulmonary metastasis and resolution of his thoracic adenopathy.  His CEA is also responded well.  His last CEA on 03/10/2023 was 16.50  He does have some slight abdominal pain.  This certainly could be from where he had his surgery.  He is going to the bathroom.  I told to make sure that he takes a laxative every day so that he goes every day.  He is eating well.  His weight is doing nicely.  He has had no problems with nausea or vomiting.  On occasion he does have little bit of vomiting.  He says he is started taking antinausea medicine a couple days before he starts his chemotherapy.  He has had no leg swelling.  There is been no rashes.  He has had no fever.  Overall, I would say that his performance status is probably ECOG 1.   Medications:  Current Outpatient Medications:    acetaminophen (TYLENOL) 500 MG tablet, Take 2 tablets (1,000 mg total) by mouth every 6 (six) hours as needed for mild pain., Disp: 30 tablet, Rfl: 0   dexamethasone (DECADRON) 4 MG tablet, Take 2 tablets (8 mg total) by mouth daily for 3 days, starting the day after chemotherapy. Take with food., Disp: 30 tablet, Rfl: 1   docusate sodium (COLACE) 100 MG capsule, Take 1 capsule (100 mg total) by mouth 2 (two) times daily. (Patient taking differently: Take 100 mg by mouth daily.), Disp: 10 capsule, Rfl: 0   feeding supplement (ENSURE ENLIVE / ENSURE PLUS) LIQD, Take 237 mLs by  mouth 2 (two) times daily between meals. (Patient not taking: Reported on 03/10/2023), Disp: 237 mL, Rfl: 12   lactose free nutrition (BOOST) LIQD, Take 237 mLs by mouth daily as needed (Supplement)., Disp: , Rfl:    lactulose (CHRONULAC) 10 GM/15ML solution, Take 15 mLs (10 g total) by mouth 3 (three) times daily. (Patient not taking: Reported on 03/10/2023), Disp: 236 mL, Rfl: 0   lidocaine-prilocaine (EMLA) cream, Apply 1 Application topically as needed. (Patient not taking: Reported on 03/10/2023), Disp: 30 g, Rfl: 0   loperamide (IMODIUM A-D) 2 MG tablet, Take 2 tabs by mouth with first loose stool, then 1 tablet with each additional loose stool as needed. Do not exceed 8 tablets in a 24-hour period. (Patient not taking: Reported on 03/10/2023), Disp: 100 tablet, Rfl: 3   loratadine (CLARITIN) 10 MG tablet, Take 10 mg by mouth. Takes day of injection-Udenyca, post chemotherapy., Disp: , Rfl:    LORazepam (ATIVAN) 0.5 MG tablet, Place 1 tablet (0.5 mg total) under the tongue every 6 (six) hours as needed for anxiety., Disp: 60 tablet, Rfl: 0   methocarbamol (ROBAXIN) 500 MG tablet, Take 1 tablet (500 mg total) by mouth every 6 (six) hours as needed for muscle spasms., Disp: 30 tablet, Rfl: 0   nicotine (NICODERM CQ - DOSED IN MG/24 HOURS) 21 mg/24hr patch, Place 1 patch (21 mg total) onto  the skin daily. (Patient not taking: Reported on 03/10/2023), Disp: 30 patch, Rfl: 1   nystatin (MYCOSTATIN) 100000 UNIT/ML suspension, Take 5 mLs (500,000 Units total) by mouth 4 (four) times daily. (Patient not taking: Reported on 03/10/2023), Disp: 473 mL, Rfl: 1   ondansetron (ZOFRAN) 8 MG tablet, Take 1 tablet (8 mg total) by mouth every 8 (eight) hours as needed for nausea or vomiting. Start on the third day after cisplatin., Disp: 30 tablet, Rfl: 2   oxyCODONE (OXY IR/ROXICODONE) 5 MG immediate release tablet, Take 1 tablet (5 mg total) by mouth every 6 (six) hours as needed for severe pain., Disp: 60 tablet, Rfl:  0   pantoprazole (PROTONIX) 40 MG tablet, Take 1 tablet (40 mg total) by mouth 2 (two) times daily. (Patient not taking: Reported on 03/10/2023), Disp: 60 tablet, Rfl: 6   polyethylene glycol (MIRALAX / GLYCOLAX) 17 g packet, Take 17 g by mouth daily as needed for mild constipation. (Patient not taking: Reported on 03/10/2023), Disp: 14 each, Rfl: 0   prochlorperazine (COMPAZINE) 10 MG tablet, Take 1 tablet (10 mg total) by mouth every 6 (six) hours as needed for nausea or vomiting., Disp: 30 tablet, Rfl: 3  Allergies: No Known Allergies  Past Medical History, Surgical history, Social history, and Family History were reviewed and updated.  Review of Systems: Review of Systems  Constitutional: Negative.   HENT:  Negative.    Eyes: Negative.   Respiratory: Negative.    Cardiovascular: Negative.   Gastrointestinal:  Positive for abdominal pain and constipation.  Endocrine: Negative.   Genitourinary: Negative.    Musculoskeletal: Negative.   Skin: Negative.   Neurological: Negative.   Hematological: Negative.   Psychiatric/Behavioral: Negative.      Physical Exam: His vital signs show temperature of 98.  Pulse 69.  Blood pressure 114/71.  Weight is 182 pounds.  Wt Readings from Last 3 Encounters:  03/10/23 190 lb 1.9 oz (86.2 kg)  01/27/23 182 lb (82.6 kg)  01/12/23 173 lb (78.5 kg)    Physical Exam Vitals reviewed.  HENT:     Head: Normocephalic and atraumatic.  Eyes:     Pupils: Pupils are equal, round, and reactive to light.  Cardiovascular:     Rate and Rhythm: Normal rate and regular rhythm.     Heart sounds: Normal heart sounds.  Pulmonary:     Effort: Pulmonary effort is normal.     Breath sounds: Normal breath sounds.  Abdominal:     General: Bowel sounds are normal.     Palpations: Abdomen is soft.     Comments: Abdominal exam shows a well-healed laparotomy scar.  He has no fluid wave.  There is no guarding or rebound tenderness.  He does have hepatomegaly with  his liver edge about 7-8 cm below the right costal margin.  There is no splenomegaly.  Musculoskeletal:        General: No tenderness or deformity. Normal range of motion.     Cervical back: Normal range of motion.  Lymphadenopathy:     Cervical: No cervical adenopathy.  Skin:    General: Skin is warm and dry.     Findings: No erythema or rash.  Neurological:     Mental Status: He is alert and oriented to person, place, and time.  Psychiatric:        Behavior: Behavior normal.        Thought Content: Thought content normal.        Judgment: Judgment normal.  Lab Results  Component Value Date   WBC 6.2 03/30/2023   HGB 13.4 03/30/2023   HCT 41.9 03/30/2023   MCV 94.6 03/30/2023   PLT 94 (L) 03/30/2023     Chemistry      Component Value Date/Time   NA 141 03/10/2023 0809   K 3.7 03/10/2023 0809   CL 107 03/10/2023 0809   CO2 29 03/10/2023 0809   BUN 11 03/10/2023 0809   CREATININE 0.92 03/10/2023 0809      Component Value Date/Time   CALCIUM 9.3 03/10/2023 0809   ALKPHOS 279 (H) 03/10/2023 0809   AST 44 (H) 03/10/2023 0809   ALT 43 03/10/2023 0809   BILITOT 0.4 03/10/2023 0809       Impression and Plan: Mr. Cova is a very nice 47 year old white male.  He has really no past medical history.  He came to the hospital with abdominal pain.  He was found to have metastatic colon cancer.  We will go ahead with the 7th cycle of chemotherapy.  He is K-ras mutated.  As such, we cannot use EGFR inhibitors.  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  As such, we might be able to utilize anti-HER2 therapy in the future.  I still would just go with FOLFOXIRI.  I do not think we need to add Avastin right now.  I would probably continue him for at least 8 cycles.  After the eighth cycle, we will then plan for a follow-up CT scan and see everything looks.  After that, we might consider putting him on a maintenance protocol with possibly Xeloda and Avastin.   Rushie Chestnut,  PA-C 7/30/20249:03 AM

## 2023-03-31 ENCOUNTER — Encounter: Payer: Self-pay | Admitting: Hematology & Oncology

## 2023-04-01 ENCOUNTER — Other Ambulatory Visit (HOSPITAL_BASED_OUTPATIENT_CLINIC_OR_DEPARTMENT_OTHER): Payer: Self-pay

## 2023-04-01 ENCOUNTER — Inpatient Hospital Stay: Payer: Medicaid Other

## 2023-04-01 ENCOUNTER — Other Ambulatory Visit: Payer: Self-pay | Admitting: *Deleted

## 2023-04-01 DIAGNOSIS — C787 Secondary malignant neoplasm of liver and intrahepatic bile duct: Secondary | ICD-10-CM

## 2023-04-01 MED ORDER — OXYCODONE HCL 5 MG PO TABS
5.0000 mg | ORAL_TABLET | Freq: Four times a day (QID) | ORAL | 0 refills | Status: DC | PRN
  Filled 2023-04-01: qty 60, 15d supply, fill #0

## 2023-04-05 ENCOUNTER — Inpatient Hospital Stay: Payer: Medicaid Other | Attending: Hematology & Oncology

## 2023-04-05 ENCOUNTER — Inpatient Hospital Stay: Payer: Medicaid Other

## 2023-04-05 ENCOUNTER — Encounter: Payer: Self-pay | Admitting: *Deleted

## 2023-04-05 VITALS — BP 110/64 | HR 69 | Temp 98.4°F | Resp 16

## 2023-04-05 DIAGNOSIS — Z5111 Encounter for antineoplastic chemotherapy: Secondary | ICD-10-CM | POA: Diagnosis not present

## 2023-04-05 DIAGNOSIS — Z5189 Encounter for other specified aftercare: Secondary | ICD-10-CM | POA: Diagnosis not present

## 2023-04-05 DIAGNOSIS — C187 Malignant neoplasm of sigmoid colon: Secondary | ICD-10-CM | POA: Diagnosis present

## 2023-04-05 DIAGNOSIS — C787 Secondary malignant neoplasm of liver and intrahepatic bile duct: Secondary | ICD-10-CM | POA: Diagnosis present

## 2023-04-05 DIAGNOSIS — C189 Malignant neoplasm of colon, unspecified: Secondary | ICD-10-CM

## 2023-04-05 LAB — CBC WITH DIFFERENTIAL (CANCER CENTER ONLY)
Abs Immature Granulocytes: 0.01 10*3/uL (ref 0.00–0.07)
Basophils Absolute: 0 10*3/uL (ref 0.0–0.1)
Basophils Relative: 0 %
Eosinophils Absolute: 0.1 10*3/uL (ref 0.0–0.5)
Eosinophils Relative: 3 %
HCT: 43.5 % (ref 39.0–52.0)
Hemoglobin: 14.2 g/dL (ref 13.0–17.0)
Immature Granulocytes: 0 %
Lymphocytes Relative: 26 %
Lymphs Abs: 1.2 10*3/uL (ref 0.7–4.0)
MCH: 30.7 pg (ref 26.0–34.0)
MCHC: 32.6 g/dL (ref 30.0–36.0)
MCV: 94.2 fL (ref 80.0–100.0)
Monocytes Absolute: 0.4 10*3/uL (ref 0.1–1.0)
Monocytes Relative: 9 %
Neutro Abs: 2.9 10*3/uL (ref 1.7–7.7)
Neutrophils Relative %: 62 %
Platelet Count: 114 10*3/uL — ABNORMAL LOW (ref 150–400)
RBC: 4.62 MIL/uL (ref 4.22–5.81)
RDW: 18.6 % — ABNORMAL HIGH (ref 11.5–15.5)
WBC Count: 4.7 10*3/uL (ref 4.0–10.5)
nRBC: 0 % (ref 0.0–0.2)

## 2023-04-05 LAB — CMP (CANCER CENTER ONLY)
ALT: 63 U/L — ABNORMAL HIGH (ref 0–44)
AST: 59 U/L — ABNORMAL HIGH (ref 15–41)
Albumin: 3.8 g/dL (ref 3.5–5.0)
Alkaline Phosphatase: 244 U/L — ABNORMAL HIGH (ref 38–126)
Anion gap: 8 (ref 5–15)
BUN: 10 mg/dL (ref 6–20)
CO2: 29 mmol/L (ref 22–32)
Calcium: 10 mg/dL (ref 8.9–10.3)
Chloride: 105 mmol/L (ref 98–111)
Creatinine: 0.97 mg/dL (ref 0.61–1.24)
GFR, Estimated: >60 mL/min (ref >60)
Glucose, Bld: 131 mg/dL — ABNORMAL HIGH (ref 70–99)
Potassium: 3.8 mmol/L (ref 3.5–5.1)
Sodium: 142 mmol/L (ref 135–145)
Total Bilirubin: 0.5 mg/dL (ref 0.3–1.2)
Total Protein: 7.2 g/dL (ref 6.5–8.1)

## 2023-04-05 MED ORDER — SODIUM CHLORIDE 0.9% FLUSH: 10.0000 mL | INTRAVENOUS | Status: DC | PRN

## 2023-04-05 MED ORDER — SODIUM CHLORIDE 0.9 % IV SOLN
10.0000 mg | Freq: Once | INTRAVENOUS | Status: AC
  Administered 2023-04-05: 10 mg via INTRAVENOUS
  Filled 2023-04-05: qty 10

## 2023-04-05 MED ORDER — LEUCOVORIN CALCIUM INJECTION 350 MG
400.0000 mg/m2 | Freq: Once | INTRAVENOUS | Status: AC
  Administered 2023-04-05: 824 mg via INTRAVENOUS
  Filled 2023-04-05: qty 41.2

## 2023-04-05 MED ORDER — OXALIPLATIN CHEMO INJECTION 100 MG/20ML
68.0000 mg/m2 | Freq: Once | INTRAVENOUS | Status: AC
  Administered 2023-04-05: 150 mg via INTRAVENOUS
  Filled 2023-04-05: qty 20

## 2023-04-05 MED ORDER — LEUCOVORIN CALCIUM INJECTION 350 MG
400.0000 mg/m2 | Freq: Once | INTRAVENOUS | Status: DC
  Filled 2023-04-05: qty 41.2

## 2023-04-05 MED ORDER — DEXTROSE 5 % IV SOLN: Freq: Once | INTRAVENOUS | Status: AC

## 2023-04-05 MED ORDER — HEPARIN SOD (PORK) LOCK FLUSH 100 UNIT/ML IV SOLN: 500.0000 [IU] | Freq: Once | INTRAVENOUS | Status: DC | PRN

## 2023-04-05 MED ORDER — SODIUM CHLORIDE 0.9 % IV SOLN
132.0000 mg/m2 | Freq: Once | INTRAVENOUS | Status: AC
  Administered 2023-04-05: 300 mg via INTRAVENOUS
  Filled 2023-04-05: qty 15

## 2023-04-05 MED ORDER — ATROPINE SULFATE 1 MG/ML IV SOLN
0.5000 mg | Freq: Once | INTRAVENOUS | Status: AC | PRN
  Administered 2023-04-05: 0.5 mg via INTRAVENOUS
  Filled 2023-04-05: qty 1

## 2023-04-05 MED ORDER — SODIUM CHLORIDE 0.9 % IV SOLN
2400.0000 mg/m2 | INTRAVENOUS | Status: DC
  Administered 2023-04-05: 5000 mg via INTRAVENOUS
  Filled 2023-04-05: qty 100

## 2023-04-05 MED ORDER — PALONOSETRON HCL INJECTION 0.25 MG/5ML
0.2500 mg | Freq: Once | INTRAVENOUS | Status: AC
  Administered 2023-04-05: 0.25 mg via INTRAVENOUS
  Filled 2023-04-05: qty 5

## 2023-04-05 MED ORDER — SODIUM CHLORIDE 0.9 % IV SOLN
150.0000 mg | Freq: Once | INTRAVENOUS | Status: AC
  Administered 2023-04-05: 150 mg via INTRAVENOUS
  Filled 2023-04-05: qty 150

## 2023-04-05 NOTE — Patient Instructions (Signed)

## 2023-04-05 NOTE — Progress Notes (Signed)
Patient was a no-show to his 7/23 appointment which was rescheduled to 7/30. At that appointment he stated he was busy and needed to reschedule his treatment to today. He did receive cycle seven today. He will need scans after his next cycle.   Oncology Nurse Navigator Documentation     04/05/2023    2:45 PM  Oncology Nurse Navigator Flowsheets  Navigator Follow Up Date: 04/19/2023  Navigator Follow Up Reason: Follow-up Appointment;Chemotherapy  Navigator Location [facility]  Navigator Encounter Type Treatment;Appt/Treatment Plan Review  Patient Visit Type MedOnc  Treatment Phase Active Tx  Barriers/Navigation Needs No Barriers At This Time  Interventions Psycho-Social Support  Acuity Level 1-No Barriers  Time Spent with Patient 15

## 2023-04-05 NOTE — Patient Instructions (Signed)
[facility]  Discharge Instructions: Thank you for choosing [facility] to provide your oncology and hematology care.   If you have a lab appointment with the Cancer Center, please go directly to the Cancer Center and check in at the registration area.  Wear comfortable clothing and clothing appropriate for easy access to any Portacath or PICC line.   We strive to give you quality time with your provider. You may need to reschedule your appointment if you arrive late (15 or more minutes).  Arriving late affects you and other patients whose appointments are after yours.  Also, if you miss three or more appointments without notifying the office, you may be dismissed from the clinic at the provider's discretion.      For prescription refill requests, have your pharmacy contact our office and allow 72 hours for refills to be completed.    Today you received the following chemotherapy and/or immunotherapy agents 5FU, Oxaliplatin, Camptosar      To help prevent nausea and vomiting after your treatment, we encourage you to take your nausea medication as directed.  BELOW ARE SYMPTOMS THAT SHOULD BE REPORTED IMMEDIATELY: *FEVER GREATER THAN 100.4 F (38 C) OR HIGHER *CHILLS OR SWEATING *NAUSEA AND VOMITING THAT IS NOT CONTROLLED WITH YOUR NAUSEA MEDICATION *UNUSUAL SHORTNESS OF BREATH *UNUSUAL BRUISING OR BLEEDING *URINARY PROBLEMS (pain or burning when urinating, or frequent urination) *BOWEL PROBLEMS (unusual diarrhea, constipation, pain near the anus) TENDERNESS IN MOUTH AND THROAT WITH OR WITHOUT PRESENCE OF ULCERS (sore throat, sores in mouth, or a toothache) UNUSUAL RASH, SWELLING OR PAIN  UNUSUAL VAGINAL DISCHARGE OR ITCHING   Items with * indicate a potential emergency and should be followed up as soon as possible or go to the Emergency Department if any problems should occur.  Please show the CHEMOTHERAPY ALERT CARD or IMMUNOTHERAPY  ALERT CARD at check-in to the Emergency Department and triage nurse. Should you have questions after your visit or need to cancel or reschedule your appointment, please contact [facility] [place]  [phone] and follow the prompts.  Office hours are 8:00 a.m. to 4:30 p.m. Monday - Friday. Please note that voicemails left after 4:00 p.m. may not be returned until the following business day.  We are closed weekends and major holidays. You have access to a nurse at all times for urgent questions. Please call the main number to the clinic [phone] and follow the prompts.  For any non-urgent questions, you may also contact your provider using MyChart. We now offer e-Visits for anyone 82 and older to request care online for non-urgent symptoms. For details visit [email or web address].   Also download the MyChart app! Go to the app store, search "MyChart", open the app, select [place], and log in with your MyChart username and password.

## 2023-04-07 ENCOUNTER — Inpatient Hospital Stay: Payer: Medicaid Other

## 2023-04-07 VITALS — BP 106/64 | HR 56 | Temp 97.9°F | Resp 17

## 2023-04-07 DIAGNOSIS — C189 Malignant neoplasm of colon, unspecified: Secondary | ICD-10-CM

## 2023-04-07 DIAGNOSIS — Z5111 Encounter for antineoplastic chemotherapy: Secondary | ICD-10-CM | POA: Diagnosis not present

## 2023-04-07 MED ORDER — PEGFILGRASTIM-CBQV 6 MG/0.6ML ~~LOC~~ SOSY
6.0000 mg | PREFILLED_SYRINGE | Freq: Once | SUBCUTANEOUS | Status: AC
  Administered 2023-04-07: 6 mg via SUBCUTANEOUS
  Filled 2023-04-07: qty 0.6

## 2023-04-07 MED ORDER — SODIUM CHLORIDE 0.9% FLUSH
10.0000 mL | INTRAVENOUS | Status: DC | PRN
  Administered 2023-04-07: 10 mL

## 2023-04-07 MED ORDER — HEPARIN SOD (PORK) LOCK FLUSH 100 UNIT/ML IV SOLN
500.0000 [IU] | Freq: Once | INTRAVENOUS | Status: AC | PRN
  Administered 2023-04-07: 500 [IU]

## 2023-04-07 NOTE — Patient Instructions (Signed)
[facility]  Discharge Instructions: Thank you for choosing [facility] to provide your oncology and hematology care.   If you have a lab appointment with the Cancer Center, please go directly to the Cancer Center and check in at the registration area.  Wear comfortable clothing and clothing appropriate for easy access to any Portacath or PICC line.   We strive to give you quality time with your provider. You may need to reschedule your appointment if you arrive late (15 or more minutes).  Arriving late affects you and other patients whose appointments are after yours.  Also, if you miss three or more appointments without notifying the office, you may be dismissed from the clinic at the provider's discretion.      For prescription refill requests, have your pharmacy contact our office and allow 72 hours for refills to be completed.    Today you received the following chemotherapy and/or immunotherapy agents 5FU      To help prevent nausea and vomiting after your treatment, we encourage you to take your nausea medication as directed.  BELOW ARE SYMPTOMS THAT SHOULD BE REPORTED IMMEDIATELY: *FEVER GREATER THAN 100.4 F (38 C) OR HIGHER *CHILLS OR SWEATING *NAUSEA AND VOMITING THAT IS NOT CONTROLLED WITH YOUR NAUSEA MEDICATION *UNUSUAL SHORTNESS OF BREATH *UNUSUAL BRUISING OR BLEEDING *URINARY PROBLEMS (pain or burning when urinating, or frequent urination) *BOWEL PROBLEMS (unusual diarrhea, constipation, pain near the anus) TENDERNESS IN MOUTH AND THROAT WITH OR WITHOUT PRESENCE OF ULCERS (sore throat, sores in mouth, or a toothache) UNUSUAL RASH, SWELLING OR PAIN  UNUSUAL VAGINAL DISCHARGE OR ITCHING   Items with * indicate a potential emergency and should be followed up as soon as possible or go to the Emergency Department if any problems should occur.  Please show the CHEMOTHERAPY ALERT CARD or IMMUNOTHERAPY ALERT CARD at check-in to  the Emergency Department and triage nurse. Should you have questions after your visit or need to cancel or reschedule your appointment, please contact [facility] [place]  [phone] and follow the prompts.  Office hours are 8:00 a.m. to 4:30 p.m. Monday - Friday. Please note that voicemails left after 4:00 p.m. may not be returned until the following business day.  We are closed weekends and major holidays. You have access to a nurse at all times for urgent questions. Please call the main number to the clinic [phone] and follow the prompts.  For any non-urgent questions, you may also contact your provider using MyChart. We now offer e-Visits for anyone 58 and older to request care online for non-urgent symptoms. For details visit [email or web address].   Also download the MyChart app! Go to the app store, search "MyChart", open the app, select [place], and log in with your MyChart username and password.

## 2023-04-16 ENCOUNTER — Other Ambulatory Visit: Payer: Self-pay

## 2023-04-16 DIAGNOSIS — C189 Malignant neoplasm of colon, unspecified: Secondary | ICD-10-CM

## 2023-04-19 ENCOUNTER — Other Ambulatory Visit: Payer: Self-pay | Admitting: Family

## 2023-04-19 ENCOUNTER — Telehealth: Payer: Self-pay | Admitting: Hematology & Oncology

## 2023-04-19 ENCOUNTER — Inpatient Hospital Stay: Payer: Medicaid Other

## 2023-04-19 ENCOUNTER — Other Ambulatory Visit: Payer: Self-pay | Admitting: Medical Oncology

## 2023-04-19 ENCOUNTER — Encounter: Payer: Self-pay | Admitting: Hematology & Oncology

## 2023-04-19 ENCOUNTER — Inpatient Hospital Stay (HOSPITAL_BASED_OUTPATIENT_CLINIC_OR_DEPARTMENT_OTHER): Payer: Medicaid Other | Admitting: Hematology & Oncology

## 2023-04-19 ENCOUNTER — Telehealth (HOSPITAL_BASED_OUTPATIENT_CLINIC_OR_DEPARTMENT_OTHER): Payer: Self-pay

## 2023-04-19 VITALS — BP 132/73 | HR 66 | Temp 98.1°F | Resp 20 | Ht 70.0 in | Wt 190.1 lb

## 2023-04-19 DIAGNOSIS — C189 Malignant neoplasm of colon, unspecified: Secondary | ICD-10-CM

## 2023-04-19 DIAGNOSIS — C787 Secondary malignant neoplasm of liver and intrahepatic bile duct: Secondary | ICD-10-CM

## 2023-04-19 DIAGNOSIS — Z5111 Encounter for antineoplastic chemotherapy: Secondary | ICD-10-CM | POA: Diagnosis not present

## 2023-04-19 LAB — LACTATE DEHYDROGENASE: LDH: 148 U/L (ref 98–192)

## 2023-04-19 LAB — CBC WITH DIFFERENTIAL (CANCER CENTER ONLY)
Abs Immature Granulocytes: 0.03 10*3/uL (ref 0.00–0.07)
Basophils Absolute: 0 10*3/uL (ref 0.0–0.1)
Basophils Relative: 0 %
Eosinophils Absolute: 0.2 10*3/uL (ref 0.0–0.5)
Eosinophils Relative: 4 %
HCT: 42.1 % (ref 39.0–52.0)
Hemoglobin: 13.6 g/dL (ref 13.0–17.0)
Immature Granulocytes: 1 %
Lymphocytes Relative: 28 %
Lymphs Abs: 1.7 10*3/uL (ref 0.7–4.0)
MCH: 30.6 pg (ref 26.0–34.0)
MCHC: 32.3 g/dL (ref 30.0–36.0)
MCV: 94.8 fL (ref 80.0–100.0)
Monocytes Absolute: 0.5 10*3/uL (ref 0.1–1.0)
Monocytes Relative: 9 %
Neutro Abs: 3.6 10*3/uL (ref 1.7–7.7)
Neutrophils Relative %: 58 %
Platelet Count: 101 10*3/uL — ABNORMAL LOW (ref 150–400)
RBC: 4.44 MIL/uL (ref 4.22–5.81)
RDW: 17.2 % — ABNORMAL HIGH (ref 11.5–15.5)
WBC Count: 6 10*3/uL (ref 4.0–10.5)
nRBC: 0 % (ref 0.0–0.2)

## 2023-04-19 LAB — CMP (CANCER CENTER ONLY)
ALT: 76 U/L — ABNORMAL HIGH (ref 0–44)
AST: 59 U/L — ABNORMAL HIGH (ref 15–41)
Albumin: 4 g/dL (ref 3.5–5.0)
Alkaline Phosphatase: 234 U/L — ABNORMAL HIGH (ref 38–126)
Anion gap: 8 (ref 5–15)
BUN: 16 mg/dL (ref 6–20)
CO2: 27 mmol/L (ref 22–32)
Calcium: 9.3 mg/dL (ref 8.9–10.3)
Chloride: 107 mmol/L (ref 98–111)
Creatinine: 0.95 mg/dL (ref 0.61–1.24)
GFR, Estimated: >60 mL/min (ref >60)
Glucose, Bld: 149 mg/dL — ABNORMAL HIGH (ref 70–99)
Potassium: 3.6 mmol/L (ref 3.5–5.1)
Sodium: 142 mmol/L (ref 135–145)
Total Bilirubin: 0.3 mg/dL (ref 0.3–1.2)
Total Protein: 6.7 g/dL (ref 6.5–8.1)

## 2023-04-19 MED ORDER — PALONOSETRON HCL INJECTION 0.25 MG/5ML
0.2500 mg | Freq: Once | INTRAVENOUS | Status: AC
  Administered 2023-04-19: 0.25 mg via INTRAVENOUS
  Filled 2023-04-19: qty 5

## 2023-04-19 MED ORDER — ATROPINE SULFATE 1 MG/ML IV SOLN
0.5000 mg | Freq: Once | INTRAVENOUS | Status: AC | PRN
  Administered 2023-04-19: 0.5 mg via INTRAVENOUS

## 2023-04-19 MED ORDER — DEXTROSE 5 % IV SOLN: Freq: Once | INTRAVENOUS | Status: AC

## 2023-04-19 MED ORDER — SODIUM CHLORIDE 0.9 % IV SOLN
132.0000 mg/m2 | Freq: Once | INTRAVENOUS | Status: AC
  Administered 2023-04-19: 300 mg via INTRAVENOUS
  Filled 2023-04-19: qty 15

## 2023-04-19 MED ORDER — SODIUM CHLORIDE 0.9 % IV SOLN
2400.0000 mg/m2 | INTRAVENOUS | Status: DC
  Administered 2023-04-19: 5000 mg via INTRAVENOUS
  Filled 2023-04-19: qty 100

## 2023-04-19 MED ORDER — SODIUM CHLORIDE 0.9 % IV SOLN
10.0000 mg | Freq: Once | INTRAVENOUS | Status: AC
  Administered 2023-04-19: 10 mg via INTRAVENOUS
  Filled 2023-04-19: qty 10

## 2023-04-19 MED ORDER — OXALIPLATIN CHEMO INJECTION 100 MG/20ML
68.0000 mg/m2 | Freq: Once | INTRAVENOUS | Status: AC
  Administered 2023-04-19: 150 mg via INTRAVENOUS
  Filled 2023-04-19: qty 20

## 2023-04-19 MED ORDER — LEUCOVORIN CALCIUM INJECTION 350 MG
400.0000 mg/m2 | Freq: Once | INTRAVENOUS | Status: DC
  Filled 2023-04-19: qty 41.2

## 2023-04-19 MED ORDER — LEUCOVORIN CALCIUM INJECTION 350 MG
400.0000 mg/m2 | Freq: Once | INTRAVENOUS | Status: AC
  Administered 2023-04-19: 824 mg via INTRAVENOUS
  Filled 2023-04-19: qty 41.2

## 2023-04-19 MED ORDER — SODIUM CHLORIDE 0.9 % IV SOLN
150.0000 mg | Freq: Once | INTRAVENOUS | Status: AC
  Administered 2023-04-19: 150 mg via INTRAVENOUS
  Filled 2023-04-19: qty 150

## 2023-04-19 NOTE — Patient Instructions (Signed)

## 2023-04-19 NOTE — Telephone Encounter (Signed)
sent message to imaging to get ct chest, abd, and pel with contrast scheduled for him in two weeks.

## 2023-04-19 NOTE — Patient Instructions (Signed)
[facility]  Discharge Instructions: Thank you for choosing [facility] to provide your oncology and hematology care.   If you have a lab appointment with the Cancer Center, please go directly to the Cancer Center and check in at the registration area.  Wear comfortable clothing and clothing appropriate for easy access to any Portacath or PICC line.   We strive to give you quality time with your provider. You may need to reschedule your appointment if you arrive late (15 or more minutes).  Arriving late affects you and other patients whose appointments are after yours.  Also, if you miss three or more appointments without notifying the office, you may be dismissed from the clinic at the provider's discretion.      For prescription refill requests, have your pharmacy contact our office and allow 72 hours for refills to be completed.    Today you received the following chemotherapy and/or immunotherapy agents Oxaliplatin/Irinotecan/Leucovorin/5 Fu      To help prevent nausea and vomiting after your treatment, we encourage you to take your nausea medication as directed.  BELOW ARE SYMPTOMS THAT SHOULD BE REPORTED IMMEDIATELY: *FEVER GREATER THAN 100.4 F (38 C) OR HIGHER *CHILLS OR SWEATING *NAUSEA AND VOMITING THAT IS NOT CONTROLLED WITH YOUR NAUSEA MEDICATION *UNUSUAL SHORTNESS OF BREATH *UNUSUAL BRUISING OR BLEEDING *URINARY PROBLEMS (pain or burning when urinating, or frequent urination) *BOWEL PROBLEMS (unusual diarrhea, constipation, pain near the anus) TENDERNESS IN MOUTH AND THROAT WITH OR WITHOUT PRESENCE OF ULCERS (sore throat, sores in mouth, or a toothache) UNUSUAL RASH, SWELLING OR PAIN  UNUSUAL VAGINAL DISCHARGE OR ITCHING   Items with * indicate a potential emergency and should be followed up as soon as possible or go to the Emergency Department if any problems should occur.  Please show the CHEMOTHERAPY ALERT CARD or  IMMUNOTHERAPY ALERT CARD at check-in to the Emergency Department and triage nurse. Should you have questions after your visit or need to cancel or reschedule your appointment, please contact [facility]  [phone] and follow the prompts.  Office hours are 8:00 a.m. to 4:30 p.m. Monday - Friday. Please note that voicemails left after 4:00 p.m. may not be returned until the following business day.  We are closed weekends and major holidays. You have access to a nurse at all times for urgent questions. Please call the main number to the clinic [phone] and follow the prompts.  For any non-urgent questions, you may also contact your provider using MyChart. We now offer e-Visits for anyone 94 and older to request care online for non-urgent symptoms. For details visit [email or web address].   Also download the MyChart app! Go to the app store, search "MyChart", open the app, select [facility], and log in with your MyChart username and password.

## 2023-04-19 NOTE — Progress Notes (Signed)
Hematology and Oncology Follow Up Visit  CARDAE SAPP [id number] 1975-11-18 47 y.o. 04/19/2023   Principle Diagnosis:  Metastatic adenocarcinoma of the colon-liver metastasis -- pMMR/MSI-low -- (+) KRAS/ ERBB2(+)       Iron deficiency anemia Current Therapy:   FOLFOXIRI - s/p cycle #7  - start on  -- 12/09/2022 IV iron as indicated     Interim History:  Mr. Mclauchlan is back for follow-up.  He really looks quite good.  He is doing well.  He is tolerated treatment quite nicely.  His CEA has come down incredibly well.  Just 3 months ago, the CEA was 190.  When we last checked in early July, it was down to 16.5.  He still has some occasional abdominal pain.  I am sure this is from his past surgery that he had because of bowel obstruction.  He has had a decent appetite.  He does not eat a lot at 1 time.  I just do not think he can eat a lot at 1 time.  He has had no bleeding.  There is been some constipation.  He has had no fever.  He has had no cough or shortness of breath.  I think he is still smoking.  He has had no leg swelling.  His last iron studies back in July showed a ferritin of 60 weight with iron saturation of 38%.  He has had no headache.  There is been no mouth sores.  Overall, I would say that his performance status is probably ECOG 1.  And consideration of treatment.  Last imaging- CT scan -completed on 02/15/2023 showing a nice response to treatment with decrease in hepatic and pulmonary metastasis and resolution of his thoracic adenopathy.  His CEA is also responded well.  His last CEA on 03/10/2023 was 16.50  He does continue to have some chronic abdominal pain.  Worse when constipated. Overall has improved since starting treatment however treatment also causes some constipation. Occasionally goes 3 days without stooling. Is passing gas. LMB was this morning. No blood in stools. He has tried laxatives, miralax, fiber, stool softeners and lactulose. Not currently  taking anything.   He is eating well.  He has had no problems with nausea or vomiting.  On occasion he does have little bit of vomiting.  He says he is started taking antinausea medicine a couple days before he starts his chemotherapy.  He has had no leg swelling.  There is been no rashes.  He has had no fever.  Overall, I would say that his performance status is probably ECOG 1.   Wt Readings from Last 3 Encounters:  04/19/23 190 lb 1.3 oz (86.2 kg)  03/30/23 190 lb 6.4 oz (86.4 kg)  03/10/23 190 lb 1.9 oz (86.2 kg)    Medications:  Current Outpatient Medications:    acetaminophen (TYLENOL) 500 MG tablet, Take 2 tablets (1,000 mg total) by mouth every 6 (six) hours as needed for mild pain., Disp: 30 tablet, Rfl: 0   dexamethasone (DECADRON) 4 MG tablet, Take 2 tablets (8 mg total) by mouth daily for 3 days, starting the day after chemotherapy. Take with food., Disp: 30 tablet, Rfl: 1   docusate sodium (COLACE) 100 MG capsule, Take 1 capsule (100 mg total) by mouth 2 (two) times daily., Disp: 10 capsule, Rfl: 0   lactose free nutrition (BOOST) LIQD, Take 237 mLs by mouth daily as needed (Supplement)., Disp: , Rfl:    loratadine (CLARITIN) 10 MG tablet, Take  10 mg by mouth. Takes day of injection-Udenyca, post chemotherapy., Disp: , Rfl:    LORazepam (ATIVAN) 0.5 MG tablet, Place 1 tablet (0.5 mg total) under the tongue every 6 (six) hours as needed for anxiety., Disp: 60 tablet, Rfl: 0   methocarbamol (ROBAXIN) 500 MG tablet, Take 1 tablet (500 mg total) by mouth every 6 (six) hours as needed for muscle spasms., Disp: 30 tablet, Rfl: 0   oxyCODONE (OXY IR/ROXICODONE) 5 MG immediate release tablet, Take 1 tablet (5 mg total) by mouth every 6 (six) hours as needed for severe pain., Disp: 60 tablet, Rfl: 0   pantoprazole (PROTONIX) 40 MG tablet, Take 1 tablet (40 mg total) by mouth 2 (two) times daily., Disp: 60 tablet, Rfl: 6   lactulose (CHRONULAC) 10 GM/15ML solution, Take 15 mLs (10 g  total) by mouth 3 (three) times daily. (Patient not taking: Reported on 03/10/2023), Disp: 236 mL, Rfl: 0   lidocaine-prilocaine (EMLA) cream, Apply 1 Application topically as needed. (Patient not taking: Reported on 03/10/2023), Disp: 30 g, Rfl: 0   loperamide (IMODIUM A-D) 2 MG tablet, Take 2 tabs by mouth with first loose stool, then 1 tablet with each additional loose stool as needed. Do not exceed 8 tablets in a 24-hour period. (Patient not taking: Reported on 03/10/2023), Disp: 100 tablet, Rfl: 3   nicotine (NICODERM CQ - DOSED IN MG/24 HOURS) 21 mg/24hr patch, Place 1 patch (21 mg total) onto the skin daily. (Patient not taking: Reported on 03/10/2023), Disp: 30 patch, Rfl: 1   nystatin (MYCOSTATIN) 100000 UNIT/ML suspension, Take 5 mLs (500,000 Units total) by mouth 4 (four) times daily. (Patient not taking: Reported on 03/10/2023), Disp: 473 mL, Rfl: 1   ondansetron (ZOFRAN) 8 MG tablet, Take 1 tablet (8 mg total) by mouth every 8 (eight) hours as needed for nausea or vomiting. Start on the third day after cisplatin. (Patient not taking: Reported on 04/19/2023), Disp: 30 tablet, Rfl: 2   polyethylene glycol (MIRALAX / GLYCOLAX) 17 g packet, Take 17 g by mouth daily as needed for mild constipation. (Patient not taking: Reported on 03/10/2023), Disp: 14 each, Rfl: 0   prochlorperazine (COMPAZINE) 10 MG tablet, Take 1 tablet (10 mg total) by mouth every 6 (six) hours as needed for nausea or vomiting. (Patient not taking: Reported on 04/19/2023), Disp: 30 tablet, Rfl: 3  Allergies: No Known Allergies  Past Medical History, Surgical history, Social history, and Family History were reviewed and updated.  Review of Systems: Review of Systems  Constitutional: Negative.   HENT:  Negative.    Eyes: Negative.   Respiratory: Negative.    Cardiovascular: Negative.   Gastrointestinal:  Positive for abdominal pain and constipation.  Endocrine: Negative.   Genitourinary: Negative.    Musculoskeletal:  Negative.   Skin: Negative.   Neurological: Negative.   Hematological: Negative.   Psychiatric/Behavioral: Negative.      Physical Exam: Vital signs are temperature of 98.1.  Pulse 66.  Blood pressure 132/73.  Weight is 190 pounds.  Wt Readings from Last 3 Encounters:  04/19/23 190 lb 1.3 oz (86.2 kg)  03/30/23 190 lb 6.4 oz (86.4 kg)  03/10/23 190 lb 1.9 oz (86.2 kg)    Physical Exam Vitals reviewed.  HENT:     Head: Normocephalic and atraumatic.  Eyes:     Pupils: Pupils are equal, round, and reactive to light.  Cardiovascular:     Rate and Rhythm: Normal rate and regular rhythm.     Heart sounds: Normal  heart sounds.  Pulmonary:     Effort: Pulmonary effort is normal.     Breath sounds: Normal breath sounds.  Abdominal:     General: Bowel sounds are normal.     Palpations: Abdomen is soft.     Comments: Abdominal exam shows a well-healed laparotomy scar.  He has no fluid wave.  There is no guarding or rebound tenderness.    Musculoskeletal:        General: No tenderness or deformity. Normal range of motion.     Cervical back: Normal range of motion.  Lymphadenopathy:     Cervical: No cervical adenopathy.  Skin:    General: Skin is warm and dry.     Findings: No erythema or rash.  Neurological:     Mental Status: He is alert and oriented to person, place, and time.  Psychiatric:        Behavior: Behavior normal.        Thought Content: Thought content normal.        Judgment: Judgment normal.      Lab Results  Component Value Date   WBC 4.7 04/05/2023   HGB 14.2 04/05/2023   HCT 43.5 04/05/2023   MCV 94.2 04/05/2023   PLT 114 (L) 04/05/2023     Chemistry      Component Value Date/Time   NA 142 04/05/2023 0910   K 3.8 04/05/2023 0910   CL 105 04/05/2023 0910   CO2 29 04/05/2023 0910   BUN 10 04/05/2023 0910   CREATININE 0.97 04/05/2023 0910      Component Value Date/Time   CALCIUM 10.0 04/05/2023 0910   ALKPHOS 244 (H) 04/05/2023 0910   AST 59  (H) 04/05/2023 0910   ALT 63 (H) 04/05/2023 0910   BILITOT 0.5 04/05/2023 0910      Impression and Plan: Mr. Picone is a very nice 47 year old white male.  He has really no past medical history.  He came to the hospital with abdominal pain.  He was found to have metastatic colon cancer.  He did not need emergency surgery because of bowel obstruction.  Again, he is responded as expected.  His CEA has come down quite nicely..  This will be as eighth cycle of treatment.  After this cycle, what he may do is I may just drop the oxaliplatin.  He has responded very nicely.  We can just go with the FOLFIRI.  Probably after 12 treatments, I would then see about try maintenance therapy with Xeloda/Avastin.  I am just happy that he is doing so well.  He really looks great.  His weight is gone back up.  We will plan to see him back in another 3 weeks now.  I will give him an extra week off just so that he can have a scan and just be able to enjoy Labor Day weekend.    Josph Macho, MD 8/19/20248:27 AM

## 2023-04-20 ENCOUNTER — Encounter: Payer: Self-pay | Admitting: *Deleted

## 2023-04-20 NOTE — Progress Notes (Signed)
Patient cleared for cycle eight of twelve. No scans needed at this time.   Oncology Nurse Navigator Documentation     04/20/2023    8:00 AM  Oncology Nurse Navigator Flowsheets  Navigator Follow Up Date: 05/10/2023  Navigator Follow Up Reason: Follow-up Appointment;Chemotherapy  Navigator Location [facility]  Navigator Encounter Type Appt/Treatment Plan Review  Patient Visit Type MedOnc  Treatment Phase Active Tx  Barriers/Navigation Needs No Barriers At This Time  Interventions None Required  Acuity Level 1-No Barriers  Time Spent with Patient 15

## 2023-04-21 ENCOUNTER — Other Ambulatory Visit: Payer: Self-pay

## 2023-04-21 ENCOUNTER — Other Ambulatory Visit (HOSPITAL_BASED_OUTPATIENT_CLINIC_OR_DEPARTMENT_OTHER): Payer: Self-pay

## 2023-04-21 ENCOUNTER — Inpatient Hospital Stay: Payer: Medicaid Other

## 2023-04-21 DIAGNOSIS — C787 Secondary malignant neoplasm of liver and intrahepatic bile duct: Secondary | ICD-10-CM

## 2023-04-21 DIAGNOSIS — C189 Malignant neoplasm of colon, unspecified: Secondary | ICD-10-CM

## 2023-04-21 DIAGNOSIS — Z5111 Encounter for antineoplastic chemotherapy: Secondary | ICD-10-CM | POA: Diagnosis not present

## 2023-04-21 MED ORDER — PEGFILGRASTIM-CBQV 6 MG/0.6ML ~~LOC~~ SOSY
6.0000 mg | PREFILLED_SYRINGE | Freq: Once | SUBCUTANEOUS | Status: AC
  Administered 2023-04-21: 6 mg via SUBCUTANEOUS
  Filled 2023-04-21: qty 0.6

## 2023-04-21 MED ORDER — LORAZEPAM 0.5 MG PO TABS
0.5000 mg | ORAL_TABLET | Freq: Four times a day (QID) | ORAL | 0 refills | Status: DC | PRN
Start: 2023-04-21 — End: 2024-02-01
  Filled 2023-04-21 (×2): qty 60, 15d supply, fill #0

## 2023-04-21 MED ORDER — SODIUM CHLORIDE 0.9% FLUSH
10.0000 mL | INTRAVENOUS | Status: DC | PRN
  Administered 2023-04-21: 10 mL

## 2023-04-21 MED ORDER — OXYCODONE HCL 5 MG PO TABS
5.0000 mg | ORAL_TABLET | Freq: Four times a day (QID) | ORAL | 0 refills | Status: DC | PRN
Start: 2023-04-21 — End: 2023-05-04
  Filled 2023-04-21 (×2): qty 60, 15d supply, fill #0

## 2023-04-21 MED ORDER — HEPARIN SOD (PORK) LOCK FLUSH 100 UNIT/ML IV SOLN
500.0000 [IU] | Freq: Once | INTRAVENOUS | Status: AC | PRN
  Administered 2023-04-21: 500 [IU]

## 2023-04-21 NOTE — Patient Instructions (Signed)

## 2023-04-23 ENCOUNTER — Other Ambulatory Visit (HOSPITAL_COMMUNITY): Payer: Self-pay

## 2023-04-26 ENCOUNTER — Ambulatory Visit: Payer: Medicaid Other

## 2023-04-26 ENCOUNTER — Inpatient Hospital Stay: Payer: Medicaid Other

## 2023-04-26 ENCOUNTER — Ambulatory Visit: Payer: Medicaid Other | Admitting: Hematology & Oncology

## 2023-04-26 ENCOUNTER — Other Ambulatory Visit: Payer: Medicaid Other

## 2023-05-04 ENCOUNTER — Ambulatory Visit (HOSPITAL_BASED_OUTPATIENT_CLINIC_OR_DEPARTMENT_OTHER)
Admission: RE | Admit: 2023-05-04 | Discharge: 2023-05-04 | Disposition: A | Discharge status: Home / Self Care | Payer: Medicaid Other | Source: Ambulatory Visit | Attending: Family | Admitting: Family

## 2023-05-04 ENCOUNTER — Encounter: Payer: Self-pay | Admitting: *Deleted

## 2023-05-04 ENCOUNTER — Other Ambulatory Visit: Payer: Self-pay

## 2023-05-04 ENCOUNTER — Encounter (HOSPITAL_BASED_OUTPATIENT_CLINIC_OR_DEPARTMENT_OTHER): Payer: Self-pay

## 2023-05-04 DIAGNOSIS — C787 Secondary malignant neoplasm of liver and intrahepatic bile duct: Secondary | ICD-10-CM | POA: Insufficient documentation

## 2023-05-04 DIAGNOSIS — C189 Malignant neoplasm of colon, unspecified: Secondary | ICD-10-CM | POA: Insufficient documentation

## 2023-05-04 MED ORDER — IOHEXOL 300 MG/ML  SOLN
100.0000 mL | Freq: Once | INTRAMUSCULAR | Status: AC | PRN
  Administered 2023-05-04: 100 mL via INTRAVENOUS

## 2023-05-05 ENCOUNTER — Encounter: Payer: Self-pay | Admitting: Hematology & Oncology

## 2023-05-05 ENCOUNTER — Other Ambulatory Visit (HOSPITAL_BASED_OUTPATIENT_CLINIC_OR_DEPARTMENT_OTHER): Payer: Self-pay

## 2023-05-05 MED ORDER — OXYCODONE HCL 5 MG PO TABS
5.0000 mg | ORAL_TABLET | Freq: Four times a day (QID) | ORAL | 0 refills | Status: DC | PRN
Start: 2023-05-05 — End: 2023-05-17
  Filled 2023-05-05: qty 60, 15d supply, fill #0

## 2023-05-06 ENCOUNTER — Other Ambulatory Visit (HOSPITAL_BASED_OUTPATIENT_CLINIC_OR_DEPARTMENT_OTHER): Payer: Self-pay

## 2023-05-06 ENCOUNTER — Encounter: Payer: Self-pay | Admitting: Hematology & Oncology

## 2023-05-10 ENCOUNTER — Inpatient Hospital Stay: Payer: Medicaid Other | Attending: Hematology & Oncology

## 2023-05-10 ENCOUNTER — Inpatient Hospital Stay: Payer: Medicaid Other | Admitting: Hematology & Oncology

## 2023-05-10 ENCOUNTER — Inpatient Hospital Stay: Payer: Medicaid Other

## 2023-05-10 DIAGNOSIS — Z5189 Encounter for other specified aftercare: Secondary | ICD-10-CM | POA: Insufficient documentation

## 2023-05-10 DIAGNOSIS — C787 Secondary malignant neoplasm of liver and intrahepatic bile duct: Secondary | ICD-10-CM | POA: Insufficient documentation

## 2023-05-10 DIAGNOSIS — Z5111 Encounter for antineoplastic chemotherapy: Secondary | ICD-10-CM | POA: Insufficient documentation

## 2023-05-10 DIAGNOSIS — Z79899 Other long term (current) drug therapy: Secondary | ICD-10-CM | POA: Insufficient documentation

## 2023-05-10 DIAGNOSIS — C187 Malignant neoplasm of sigmoid colon: Secondary | ICD-10-CM | POA: Insufficient documentation

## 2023-05-12 ENCOUNTER — Inpatient Hospital Stay: Payer: Medicaid Other

## 2023-05-17 ENCOUNTER — Other Ambulatory Visit: Payer: Self-pay

## 2023-05-17 ENCOUNTER — Other Ambulatory Visit (HOSPITAL_BASED_OUTPATIENT_CLINIC_OR_DEPARTMENT_OTHER): Payer: Self-pay

## 2023-05-17 ENCOUNTER — Inpatient Hospital Stay (HOSPITAL_BASED_OUTPATIENT_CLINIC_OR_DEPARTMENT_OTHER): Payer: Medicaid Other | Admitting: Hematology & Oncology

## 2023-05-17 ENCOUNTER — Inpatient Hospital Stay: Payer: Medicaid Other

## 2023-05-17 VITALS — BP 120/66 | HR 47 | Resp 17

## 2023-05-17 VITALS — BP 120/65 | HR 71 | Temp 98.6°F | Resp 17 | Ht 70.0 in | Wt 198.0 lb

## 2023-05-17 DIAGNOSIS — C787 Secondary malignant neoplasm of liver and intrahepatic bile duct: Secondary | ICD-10-CM

## 2023-05-17 DIAGNOSIS — C189 Malignant neoplasm of colon, unspecified: Secondary | ICD-10-CM

## 2023-05-17 DIAGNOSIS — Z79899 Other long term (current) drug therapy: Secondary | ICD-10-CM | POA: Diagnosis not present

## 2023-05-17 DIAGNOSIS — Z5189 Encounter for other specified aftercare: Secondary | ICD-10-CM | POA: Diagnosis not present

## 2023-05-17 DIAGNOSIS — Z5111 Encounter for antineoplastic chemotherapy: Secondary | ICD-10-CM | POA: Diagnosis present

## 2023-05-17 DIAGNOSIS — C187 Malignant neoplasm of sigmoid colon: Secondary | ICD-10-CM | POA: Diagnosis present

## 2023-05-17 LAB — FERRITIN: Ferritin: 185 ng/mL (ref 24–336)

## 2023-05-17 LAB — CBC WITH DIFFERENTIAL (CANCER CENTER ONLY)
Abs Immature Granulocytes: 0.01 10*3/uL (ref 0.00–0.07)
Basophils Absolute: 0 10*3/uL (ref 0.0–0.1)
Basophils Relative: 1 %
Eosinophils Absolute: 0.2 10*3/uL (ref 0.0–0.5)
Eosinophils Relative: 4 %
HCT: 42.9 % (ref 39.0–52.0)
Hemoglobin: 14.1 g/dL (ref 13.0–17.0)
Immature Granulocytes: 0 %
Lymphocytes Relative: 24 %
Lymphs Abs: 1 10*3/uL (ref 0.7–4.0)
MCH: 31.6 pg (ref 26.0–34.0)
MCHC: 32.9 g/dL (ref 30.0–36.0)
MCV: 96.2 fL (ref 80.0–100.0)
Monocytes Absolute: 0.5 10*3/uL (ref 0.1–1.0)
Monocytes Relative: 12 %
Neutro Abs: 2.4 10*3/uL (ref 1.7–7.7)
Neutrophils Relative %: 59 %
Platelet Count: 102 10*3/uL — ABNORMAL LOW (ref 150–400)
RBC: 4.46 MIL/uL (ref 4.22–5.81)
RDW: 15.2 % (ref 11.5–15.5)
WBC Count: 4.1 10*3/uL (ref 4.0–10.5)
nRBC: 0 % (ref 0.0–0.2)

## 2023-05-17 LAB — IRON AND IRON BINDING CAPACITY (CC-WL,HP ONLY)
Iron: 86 ug/dL (ref 45–182)
Saturation Ratios: 26 % (ref 17.9–39.5)
TIBC: 337 ug/dL (ref 250–450)
UIBC: 251 ug/dL (ref 117–376)

## 2023-05-17 LAB — CMP (CANCER CENTER ONLY)
ALT: 40 U/L (ref 0–44)
AST: 39 U/L (ref 15–41)
Albumin: 3.7 g/dL (ref 3.5–5.0)
Alkaline Phosphatase: 178 U/L — ABNORMAL HIGH (ref 38–126)
Anion gap: 6 (ref 5–15)
BUN: 13 mg/dL (ref 6–20)
CO2: 29 mmol/L (ref 22–32)
Calcium: 9.9 mg/dL (ref 8.9–10.3)
Chloride: 108 mmol/L (ref 98–111)
Creatinine: 0.83 mg/dL (ref 0.61–1.24)
GFR, Estimated: >60 mL/min (ref >60)
Glucose, Bld: 118 mg/dL — ABNORMAL HIGH (ref 70–99)
Potassium: 4.2 mmol/L (ref 3.5–5.1)
Sodium: 143 mmol/L (ref 135–145)
Total Bilirubin: 0.3 mg/dL (ref 0.3–1.2)
Total Protein: 6.7 g/dL (ref 6.5–8.1)

## 2023-05-17 LAB — CEA (IN HOUSE-CHCC): CEA (CHCC-In House): 13.31 ng/mL — ABNORMAL HIGH (ref 0.00–5.00)

## 2023-05-17 MED ORDER — SODIUM CHLORIDE 0.9 % IV SOLN: Freq: Once | INTRAVENOUS | Status: AC

## 2023-05-17 MED ORDER — SODIUM CHLORIDE 0.9 % IV SOLN
10.0000 mg | Freq: Once | INTRAVENOUS | Status: AC
  Administered 2023-05-17: 10 mg via INTRAVENOUS
  Filled 2023-05-17: qty 10

## 2023-05-17 MED ORDER — DEXAMETHASONE 4 MG PO TABS
8.0000 mg | ORAL_TABLET | Freq: Every day | ORAL | 1 refills | Status: DC
Start: 2023-05-17 — End: 2023-08-27
  Filled 2023-05-17: qty 30, 15d supply, fill #0

## 2023-05-17 MED ORDER — SODIUM CHLORIDE 0.9 % IV SOLN
132.0000 mg/m2 | Freq: Once | INTRAVENOUS | Status: AC
  Administered 2023-05-17: 300 mg via INTRAVENOUS
  Filled 2023-05-17: qty 15

## 2023-05-17 MED ORDER — PALONOSETRON HCL INJECTION 0.25 MG/5ML
0.2500 mg | Freq: Once | INTRAVENOUS | Status: AC
  Administered 2023-05-17: 0.25 mg via INTRAVENOUS
  Filled 2023-05-17: qty 5

## 2023-05-17 MED ORDER — OXYCODONE HCL 5 MG PO TABS
5.0000 mg | ORAL_TABLET | Freq: Four times a day (QID) | ORAL | 0 refills | Status: DC | PRN
  Filled 2023-05-17 – 2023-05-21 (×2): qty 60, 15d supply, fill #0

## 2023-05-17 MED ORDER — SODIUM CHLORIDE 0.9 % IV SOLN
400.0000 mg/m2 | Freq: Once | INTRAVENOUS | Status: AC
  Administered 2023-05-17: 824 mg via INTRAVENOUS
  Filled 2023-05-17: qty 41.2

## 2023-05-17 MED ORDER — SODIUM CHLORIDE 0.9 % IV SOLN
2400.0000 mg/m2 | INTRAVENOUS | Status: DC
  Administered 2023-05-17: 5000 mg via INTRAVENOUS
  Filled 2023-05-17: qty 100

## 2023-05-17 MED ORDER — SODIUM CHLORIDE 0.9 % IV SOLN
150.0000 mg | Freq: Once | INTRAVENOUS | Status: AC
  Administered 2023-05-17: 150 mg via INTRAVENOUS
  Filled 2023-05-17: qty 150

## 2023-05-17 MED ORDER — ATROPINE SULFATE 1 MG/ML IV SOLN
0.5000 mg | Freq: Once | INTRAVENOUS | Status: AC | PRN
  Administered 2023-05-17: 0.5 mg via INTRAVENOUS
  Filled 2023-05-17: qty 1

## 2023-05-17 NOTE — Patient Instructions (Signed)
[facility]  Discharge Instructions: Thank you for choosing [facility] to provide your oncology and hematology care.   If you have a lab appointment with the Cancer Center, please go directly to the Cancer Center and check in at the registration area.  Wear comfortable clothing and clothing appropriate for easy access to any Portacath or PICC line.   We strive to give you quality time with your provider. You may need to reschedule your appointment if you arrive late (15 or more minutes).  Arriving late affects you and other patients whose appointments are after yours.  Also, if you miss three or more appointments without notifying the office, you may be dismissed from the clinic at the provider's discretion.      For prescription refill requests, have your pharmacy contact our office and allow 72 hours for refills to be completed.    Today you received the following chemotherapy and/or immunotherapy agents Irinotecan/Leucovorin/5 FU       To help prevent nausea and vomiting after your treatment, we encourage you to take your nausea medication as directed.  BELOW ARE SYMPTOMS THAT SHOULD BE REPORTED IMMEDIATELY: *FEVER GREATER THAN 100.4 F (38 C) OR HIGHER *CHILLS OR SWEATING *NAUSEA AND VOMITING THAT IS NOT CONTROLLED WITH YOUR NAUSEA MEDICATION *UNUSUAL SHORTNESS OF BREATH *UNUSUAL BRUISING OR BLEEDING *URINARY PROBLEMS (pain or burning when urinating, or frequent urination) *BOWEL PROBLEMS (unusual diarrhea, constipation, pain near the anus) TENDERNESS IN MOUTH AND THROAT WITH OR WITHOUT PRESENCE OF ULCERS (sore throat, sores in mouth, or a toothache) UNUSUAL RASH, SWELLING OR PAIN  UNUSUAL VAGINAL DISCHARGE OR ITCHING   Items with * indicate a potential emergency and should be followed up as soon as possible or go to the Emergency Department if any problems should occur.  Please show the CHEMOTHERAPY ALERT CARD or IMMUNOTHERAPY  ALERT CARD at check-in to the Emergency Department and triage nurse. Should you have questions after your visit or need to cancel or reschedule your appointment, please contact [facility] [place]  [phone] and follow the prompts.  Office hours are 8:00 a.m. to 4:30 p.m. Monday - Friday. Please note that voicemails left after 4:00 p.m. may not be returned until the following business day.  We are closed weekends and major holidays. You have access to a nurse at all times for urgent questions. Please call the main number to the clinic [phone] and follow the prompts.  For any non-urgent questions, you may also contact your provider using MyChart. We now offer e-Visits for anyone 13 and older to request care online for non-urgent symptoms. For details visit [email or web address].   Also download the MyChart app! Go to the app store, search "MyChart", open the app, select [place], and log in with your MyChart username and password.

## 2023-05-17 NOTE — Progress Notes (Signed)
Hematology and Oncology Follow Up Visit  Richard Bowman [id number] 1976/05/15 47 y.o. 05/17/2023   Principle Diagnosis:  Metastatic adenocarcinoma of the colon-liver metastasis -- pMMR/MSI-low -- (+) KRAS/ ERBB2(+)       Iron deficiency anemia Current Therapy:   FOLFOXIRI - s/p cycle #8  - start on  -- 12/09/2022 --oxaliplatin dropped after 04/19/2023 IV iron as indicated     Interim History:  Richard Bowman is back for follow-up.  Richard Bowman is doing incredibly well.  100 very happy that Richard Bowman is doing so nicely.  His last CEA was 216.  We have dropped the oxaliplatin now.  I do not want him to have any more in the way of neuropathy.  Richard Bowman is gaining weight.  Richard Bowman is eating well.  Richard Bowman is having no problems with nausea or vomiting.  Richard Bowman is having no cough or shortness of breath.  There is been no bleeding.  Richard Bowman has had no mouth sores.  Is a little bit of a rash on his neck.  I do not know if this is where Richard Bowman was shaving.  Richard Bowman told him that Richard Bowman could try some over-the-counter hydrocortisone.  Richard Bowman has had no leg swelling.  Richard Bowman has had no headache.  His last iron studies that were done back in July showed a ferritin of 608 with an iron saturation of 38%.  Currently, Richard Bowman would say that his performance status is probably ECOG 1.  .   Wt Readings from Last 3 Encounters:  05/17/23 198 lb (89.8 kg)  04/19/23 190 lb 1.3 oz (86.2 kg)  03/30/23 190 lb 6.4 oz (86.4 kg)    Medications:  Current Outpatient Medications:    acetaminophen (TYLENOL) 500 MG tablet, Take 2 tablets (1,000 mg total) by mouth every 6 (six) hours as needed for mild pain., Disp: 30 tablet, Rfl: 0   dexamethasone (DECADRON) 4 MG tablet, Take 2 tablets (8 mg total) by mouth daily for 3 days, starting the day after chemotherapy. Take with food., Disp: 30 tablet, Rfl: 1   docusate sodium (COLACE) 100 MG capsule, Take 1 capsule (100 mg total) by mouth 2 (two) times daily., Disp: 10 capsule, Rfl: 0   lactose free nutrition (BOOST) LIQD, Take 237 mLs  by mouth daily as needed (Supplement)., Disp: , Rfl:    lactulose (CHRONULAC) 10 GM/15ML solution, Take 15 mLs (10 g total) by mouth 3 (three) times daily., Disp: 236 mL, Rfl: 0   lidocaine-prilocaine (EMLA) cream, Apply 1 Application topically as needed., Disp: 30 g, Rfl: 0   loperamide (IMODIUM A-D) 2 MG tablet, Take 2 tabs by mouth with first loose stool, then 1 tablet with each additional loose stool as needed. Do not exceed 8 tablets in a 24-hour period., Disp: 100 tablet, Rfl: 3   loratadine (CLARITIN) 10 MG tablet, Take 10 mg by mouth. Takes day of injection-Udenyca, post chemotherapy., Disp: , Rfl:    LORazepam (ATIVAN) 0.5 MG tablet, Place 1 tablet (0.5 mg total) under the tongue every 6 (six) hours as needed for anxiety., Disp: 60 tablet, Rfl: 0   methocarbamol (ROBAXIN) 500 MG tablet, Take 1 tablet (500 mg total) by mouth every 6 (six) hours as needed for muscle spasms., Disp: 30 tablet, Rfl: 0   nicotine (NICODERM CQ - DOSED IN MG/24 HOURS) 21 mg/24hr patch, Place 1 patch (21 mg total) onto the skin daily., Disp: 30 patch, Rfl: 1   nystatin (MYCOSTATIN) 100000 UNIT/ML suspension, Take 5 mLs (500,000 Units total) by mouth 4 (  four) times daily., Disp: 473 mL, Rfl: 1   ondansetron (ZOFRAN) 8 MG tablet, Take 1 tablet (8 mg total) by mouth every 8 (eight) hours as needed for nausea or vomiting. Start on the third day after cisplatin., Disp: 30 tablet, Rfl: 2   oxyCODONE (OXY IR/ROXICODONE) 5 MG immediate release tablet, Take 1 tablet (5 mg total) by mouth every 6 (six) hours as needed for severe pain., Disp: 60 tablet, Rfl: 0   pantoprazole (PROTONIX) 40 MG tablet, Take 1 tablet (40 mg total) by mouth 2 (two) times daily., Disp: 60 tablet, Rfl: 6   polyethylene glycol (MIRALAX / GLYCOLAX) 17 g packet, Take 17 g by mouth daily as needed for mild constipation., Disp: 14 each, Rfl: 0   prochlorperazine (COMPAZINE) 10 MG tablet, Take 1 tablet (10 mg total) by mouth every 6 (six) hours as needed for  nausea or vomiting., Disp: 30 tablet, Rfl: 3  Allergies: No Known Allergies  Past Medical History, Surgical history, Social history, and Family History were reviewed and updated.  Review of Systems: Review of Systems  Constitutional: Negative.   HENT:  Negative.    Eyes: Negative.   Respiratory: Negative.    Cardiovascular: Negative.   Gastrointestinal:  Positive for abdominal pain and constipation.  Endocrine: Negative.   Genitourinary: Negative.    Musculoskeletal: Negative.   Skin: Negative.   Neurological: Negative.   Hematological: Negative.   Psychiatric/Behavioral: Negative.      Physical Exam: Vital signs are temperature of 98.6.  Pulse 71.  Blood pressure 120/65.  Weight is 198 pounds.   Wt Readings from Last 3 Encounters:  05/17/23 198 lb (89.8 kg)  04/19/23 190 lb 1.3 oz (86.2 kg)  03/30/23 190 lb 6.4 oz (86.4 kg)    Physical Exam Vitals reviewed.  HENT:     Head: Normocephalic and atraumatic.  Eyes:     Pupils: Pupils are equal, round, and reactive to light.  Cardiovascular:     Rate and Rhythm: Normal rate and regular rhythm.     Heart sounds: Normal heart sounds.  Pulmonary:     Effort: Pulmonary effort is normal.     Breath sounds: Normal breath sounds.  Abdominal:     General: Bowel sounds are normal.     Palpations: Abdomen is soft.     Comments: Abdominal exam shows a well-healed laparotomy scar.  Richard Bowman has no fluid wave.  There is no guarding or rebound tenderness.    Musculoskeletal:        General: No tenderness or deformity. Normal range of motion.     Cervical back: Normal range of motion.  Lymphadenopathy:     Cervical: No cervical adenopathy.  Skin:    General: Skin is warm and dry.     Findings: No erythema or rash.  Neurological:     Mental Status: Richard Bowman is alert and oriented to person, place, and time.  Psychiatric:        Behavior: Behavior normal.        Thought Content: Thought content normal.        Judgment: Judgment normal.       Lab Results  Component Value Date   WBC 4.1 05/17/2023   HGB 14.1 05/17/2023   HCT 42.9 05/17/2023   MCV 96.2 05/17/2023   PLT 102 (L) 05/17/2023     Chemistry      Component Value Date/Time   NA 143 05/17/2023 1030   K 4.2 05/17/2023 1030   CL 108 05/17/2023  1030   CO2 29 05/17/2023 1030   BUN 13 05/17/2023 1030   CREATININE 0.83 05/17/2023 1030      Component Value Date/Time   CALCIUM 9.9 05/17/2023 1030   ALKPHOS 178 (H) 05/17/2023 1030   AST 39 05/17/2023 1030   ALT 40 05/17/2023 1030   BILITOT 0.3 05/17/2023 1030      Impression and Plan: Mr. Dimasi is a very nice 47 year old white male.  Richard Bowman has really no past medical history.  Richard Bowman came to the hospital with abdominal pain.  Richard Bowman was found to have metastatic colon cancer.  Richard Bowman did need emergency surgery because of bowel obstruction.  Again, Richard Bowman is responded as expected.  His CEA has come down quite nicely..  Richard Bowman am very amazed as how well the CEA has come down.  Again, we are going to stop the oxaliplatin at this point.  We will go ahead with a total of 12 cycles and then we will plan for Xeloda/Avastin.  Again, Richard Bowman am just very pleased that his quality of life is doing well.      Josph Macho, MD 9/16/202411:16 AM

## 2023-05-17 NOTE — Patient Instructions (Signed)

## 2023-05-18 ENCOUNTER — Encounter: Payer: Self-pay | Admitting: *Deleted

## 2023-05-18 ENCOUNTER — Other Ambulatory Visit: Payer: Self-pay

## 2023-05-18 NOTE — Progress Notes (Signed)
Patient cleared for next treatment cycle.   Oncology Nurse Navigator Documentation     05/18/2023    9:30 AM  Oncology Nurse Navigator Flowsheets  Navigator Follow Up Date: 05/31/2023  Navigator Follow Up Reason: Follow-up Appointment;Chemotherapy  Navigator Location [facility]  Navigator Encounter Type Appt/Treatment Plan Review  Patient Visit Type MedOnc  Treatment Phase Active Tx  Barriers/Navigation Needs No Barriers At This Time  Interventions None Required  Acuity Level 1-No Barriers  Time Spent with Patient 15

## 2023-05-19 ENCOUNTER — Inpatient Hospital Stay: Payer: Medicaid Other

## 2023-05-19 VITALS — BP 105/74 | HR 72 | Temp 97.7°F | Resp 17

## 2023-05-19 DIAGNOSIS — C189 Malignant neoplasm of colon, unspecified: Secondary | ICD-10-CM

## 2023-05-19 DIAGNOSIS — Z5111 Encounter for antineoplastic chemotherapy: Secondary | ICD-10-CM | POA: Diagnosis not present

## 2023-05-19 MED ORDER — HEPARIN SOD (PORK) LOCK FLUSH 100 UNIT/ML IV SOLN
500.0000 [IU] | Freq: Once | INTRAVENOUS | Status: AC | PRN
  Administered 2023-05-19: 500 [IU]

## 2023-05-19 MED ORDER — PEGFILGRASTIM-CBQV 6 MG/0.6ML ~~LOC~~ SOSY
6.0000 mg | PREFILLED_SYRINGE | Freq: Once | SUBCUTANEOUS | Status: AC
  Administered 2023-05-19: 6 mg via SUBCUTANEOUS
  Filled 2023-05-19: qty 0.6

## 2023-05-19 MED ORDER — SODIUM CHLORIDE 0.9% FLUSH
10.0000 mL | INTRAVENOUS | Status: DC | PRN
  Administered 2023-05-19: 10 mL

## 2023-05-19 NOTE — Patient Instructions (Signed)

## 2023-05-20 ENCOUNTER — Other Ambulatory Visit (HOSPITAL_BASED_OUTPATIENT_CLINIC_OR_DEPARTMENT_OTHER): Payer: Self-pay

## 2023-05-21 ENCOUNTER — Other Ambulatory Visit (HOSPITAL_BASED_OUTPATIENT_CLINIC_OR_DEPARTMENT_OTHER): Payer: Self-pay

## 2023-05-25 ENCOUNTER — Ambulatory Visit: Payer: Self-pay | Admitting: Family

## 2023-05-25 ENCOUNTER — Inpatient Hospital Stay: Payer: Medicaid Other

## 2023-05-31 ENCOUNTER — Inpatient Hospital Stay: Payer: Medicaid Other

## 2023-05-31 ENCOUNTER — Inpatient Hospital Stay: Payer: Medicaid Other | Admitting: Hematology & Oncology

## 2023-05-31 ENCOUNTER — Encounter: Payer: Self-pay | Admitting: Hematology & Oncology

## 2023-05-31 DIAGNOSIS — C189 Malignant neoplasm of colon, unspecified: Secondary | ICD-10-CM

## 2023-06-02 ENCOUNTER — Inpatient Hospital Stay: Payer: Medicaid Other

## 2023-06-07 ENCOUNTER — Encounter: Payer: Self-pay | Admitting: *Deleted

## 2023-06-07 NOTE — Progress Notes (Unsigned)
Patient was a no-show to his provider appointment and treatment on 05/31/2023. Message sent to scheduling requesting attempt to reschedule.  Oncology Nurse Navigator Documentation     06/07/2023    8:45 AM  Oncology Nurse Navigator Flowsheets  Navigator Follow Up Date: 06/09/2023  Navigator Follow Up Reason: Follow-up Appointment;Chemotherapy  Navigator Location [facility]  Navigator Encounter Type Appt/Treatment Plan Review  Patient Visit Type MedOnc  Treatment Phase Active Tx  Barriers/Navigation Needs Coordination of Care  Interventions Coordination of Care  Acuity Level 1-No Barriers  Coordination of Care Appts  Time Spent with Patient 15

## 2023-06-08 ENCOUNTER — Other Ambulatory Visit: Payer: Self-pay

## 2023-06-08 ENCOUNTER — Encounter: Payer: Self-pay | Admitting: Hematology & Oncology

## 2023-06-09 ENCOUNTER — Other Ambulatory Visit: Payer: Self-pay | Admitting: Hematology & Oncology

## 2023-06-09 ENCOUNTER — Encounter: Payer: Self-pay | Admitting: *Deleted

## 2023-06-09 ENCOUNTER — Encounter: Payer: Self-pay | Admitting: Medical Oncology

## 2023-06-09 ENCOUNTER — Inpatient Hospital Stay: Payer: Medicaid Other | Attending: Hematology & Oncology

## 2023-06-09 ENCOUNTER — Inpatient Hospital Stay (HOSPITAL_BASED_OUTPATIENT_CLINIC_OR_DEPARTMENT_OTHER): Payer: Medicaid Other | Admitting: Medical Oncology

## 2023-06-09 ENCOUNTER — Inpatient Hospital Stay: Payer: Medicaid Other

## 2023-06-09 ENCOUNTER — Other Ambulatory Visit (HOSPITAL_BASED_OUTPATIENT_CLINIC_OR_DEPARTMENT_OTHER): Payer: Self-pay

## 2023-06-09 ENCOUNTER — Other Ambulatory Visit: Payer: Self-pay | Admitting: *Deleted

## 2023-06-09 ENCOUNTER — Other Ambulatory Visit: Payer: Self-pay

## 2023-06-09 VITALS — BP 118/74 | HR 74 | Temp 98.3°F | Resp 18 | Ht 70.0 in | Wt 198.0 lb

## 2023-06-09 DIAGNOSIS — M79602 Pain in left arm: Secondary | ICD-10-CM

## 2023-06-09 DIAGNOSIS — Z95828 Presence of other vascular implants and grafts: Secondary | ICD-10-CM | POA: Diagnosis not present

## 2023-06-09 DIAGNOSIS — C189 Malignant neoplasm of colon, unspecified: Secondary | ICD-10-CM

## 2023-06-09 DIAGNOSIS — G893 Neoplasm related pain (acute) (chronic): Secondary | ICD-10-CM | POA: Diagnosis not present

## 2023-06-09 DIAGNOSIS — R7401 Elevation of levels of liver transaminase levels: Secondary | ICD-10-CM

## 2023-06-09 DIAGNOSIS — R52 Pain, unspecified: Secondary | ICD-10-CM

## 2023-06-09 DIAGNOSIS — C787 Secondary malignant neoplasm of liver and intrahepatic bile duct: Secondary | ICD-10-CM

## 2023-06-09 DIAGNOSIS — C187 Malignant neoplasm of sigmoid colon: Secondary | ICD-10-CM | POA: Insufficient documentation

## 2023-06-09 DIAGNOSIS — R935 Abnormal findings on diagnostic imaging of other abdominal regions, including retroperitoneum: Secondary | ICD-10-CM

## 2023-06-09 DIAGNOSIS — R1084 Generalized abdominal pain: Secondary | ICD-10-CM

## 2023-06-09 DIAGNOSIS — Z5189 Encounter for other specified aftercare: Secondary | ICD-10-CM | POA: Diagnosis not present

## 2023-06-09 DIAGNOSIS — E875 Hyperkalemia: Secondary | ICD-10-CM

## 2023-06-09 DIAGNOSIS — Z5111 Encounter for antineoplastic chemotherapy: Secondary | ICD-10-CM | POA: Diagnosis present

## 2023-06-09 DIAGNOSIS — K6389 Other specified diseases of intestine: Secondary | ICD-10-CM

## 2023-06-09 DIAGNOSIS — F17200 Nicotine dependence, unspecified, uncomplicated: Secondary | ICD-10-CM

## 2023-06-09 DIAGNOSIS — Z79899 Other long term (current) drug therapy: Secondary | ICD-10-CM | POA: Diagnosis not present

## 2023-06-09 DIAGNOSIS — R569 Unspecified convulsions: Secondary | ICD-10-CM

## 2023-06-09 DIAGNOSIS — M62838 Other muscle spasm: Secondary | ICD-10-CM

## 2023-06-09 DIAGNOSIS — K625 Hemorrhage of anus and rectum: Secondary | ICD-10-CM

## 2023-06-09 DIAGNOSIS — Z1379 Encounter for other screening for genetic and chromosomal anomalies: Secondary | ICD-10-CM

## 2023-06-09 LAB — CMP (CANCER CENTER ONLY)
ALT: 44 U/L (ref 0–44)
AST: 45 U/L — ABNORMAL HIGH (ref 15–41)
Albumin: 3.7 g/dL (ref 3.5–5.0)
Alkaline Phosphatase: 155 U/L — ABNORMAL HIGH (ref 38–126)
Anion gap: 8 (ref 5–15)
BUN: 12 mg/dL (ref 6–20)
CO2: 28 mmol/L (ref 22–32)
Calcium: 9.2 mg/dL (ref 8.9–10.3)
Chloride: 105 mmol/L (ref 98–111)
Creatinine: 0.89 mg/dL (ref 0.61–1.24)
GFR, Estimated: >60 mL/min (ref >60)
Glucose, Bld: 195 mg/dL — ABNORMAL HIGH (ref 70–99)
Potassium: 3.5 mmol/L (ref 3.5–5.1)
Sodium: 141 mmol/L (ref 135–145)
Total Bilirubin: 0.5 mg/dL (ref 0.3–1.2)
Total Protein: 6.6 g/dL (ref 6.5–8.1)

## 2023-06-09 LAB — CBC WITH DIFFERENTIAL (CANCER CENTER ONLY)
Abs Immature Granulocytes: 0.01 10*3/uL (ref 0.00–0.07)
Basophils Absolute: 0 10*3/uL (ref 0.0–0.1)
Basophils Relative: 1 %
Eosinophils Absolute: 0.2 10*3/uL (ref 0.0–0.5)
Eosinophils Relative: 4 %
HCT: 42.1 % (ref 39.0–52.0)
Hemoglobin: 14.3 g/dL (ref 13.0–17.0)
Immature Granulocytes: 0 %
Lymphocytes Relative: 25 %
Lymphs Abs: 1.1 10*3/uL (ref 0.7–4.0)
MCH: 32.1 pg (ref 26.0–34.0)
MCHC: 34 g/dL (ref 30.0–36.0)
MCV: 94.6 fL (ref 80.0–100.0)
Monocytes Absolute: 0.4 10*3/uL (ref 0.1–1.0)
Monocytes Relative: 9 %
Neutro Abs: 2.7 10*3/uL (ref 1.7–7.7)
Neutrophils Relative %: 61 %
Platelet Count: 101 10*3/uL — ABNORMAL LOW (ref 150–400)
RBC: 4.45 MIL/uL (ref 4.22–5.81)
RDW: 14 % (ref 11.5–15.5)
WBC Count: 4.4 10*3/uL (ref 4.0–10.5)
nRBC: 0 % (ref 0.0–0.2)

## 2023-06-09 LAB — FERRITIN: Ferritin: 183 ng/mL (ref 24–336)

## 2023-06-09 LAB — CEA (IN HOUSE-CHCC): CEA (CHCC-In House): 10.06 ng/mL — ABNORMAL HIGH (ref 0.00–5.00)

## 2023-06-09 LAB — IRON AND IRON BINDING CAPACITY (CC-WL,HP ONLY)
Iron: 95 ug/dL (ref 45–182)
Saturation Ratios: 28 % (ref 17.9–39.5)
TIBC: 339 ug/dL (ref 250–450)
UIBC: 244 ug/dL (ref 117–376)

## 2023-06-09 MED ORDER — SODIUM CHLORIDE 0.9 % IV SOLN
150.0000 mg | Freq: Once | INTRAVENOUS | Status: AC
  Administered 2023-06-09: 150 mg via INTRAVENOUS
  Filled 2023-06-09: qty 150

## 2023-06-09 MED ORDER — SODIUM CHLORIDE 0.9 % IV SOLN
2400.0000 mg/m2 | INTRAVENOUS | Status: DC
  Administered 2023-06-09: 5000 mg via INTRAVENOUS
  Filled 2023-06-09: qty 100

## 2023-06-09 MED ORDER — SODIUM CHLORIDE 0.9 % IV SOLN
10.0000 mg | Freq: Once | INTRAVENOUS | Status: AC
  Administered 2023-06-09: 10 mg via INTRAVENOUS
  Filled 2023-06-09: qty 10

## 2023-06-09 MED ORDER — PALONOSETRON HCL INJECTION 0.25 MG/5ML
0.2500 mg | Freq: Once | INTRAVENOUS | Status: AC
  Administered 2023-06-09: 0.25 mg via INTRAVENOUS
  Filled 2023-06-09: qty 5

## 2023-06-09 MED ORDER — PROCHLORPERAZINE MALEATE 10 MG PO TABS
10.0000 mg | ORAL_TABLET | Freq: Four times a day (QID) | ORAL | Status: DC | PRN
  Administered 2023-06-09: 10 mg via ORAL
  Filled 2023-06-09: qty 1

## 2023-06-09 MED ORDER — OXYCODONE HCL 5 MG PO TABS
5.0000 mg | ORAL_TABLET | Freq: Four times a day (QID) | ORAL | 0 refills | Status: DC | PRN
  Filled 2023-06-09: qty 60, 15d supply, fill #0

## 2023-06-09 MED ORDER — SODIUM CHLORIDE 0.9 % IV SOLN: Freq: Once | INTRAVENOUS | Status: AC

## 2023-06-09 MED ORDER — SODIUM CHLORIDE 0.9 % IV SOLN
400.0000 mg/m2 | Freq: Once | INTRAVENOUS | Status: AC
  Administered 2023-06-09: 824 mg via INTRAVENOUS
  Filled 2023-06-09: qty 41.2

## 2023-06-09 MED ORDER — SODIUM CHLORIDE 0.9 % IV SOLN
132.0000 mg/m2 | Freq: Once | INTRAVENOUS | Status: AC
  Administered 2023-06-09: 300 mg via INTRAVENOUS
  Filled 2023-06-09: qty 15

## 2023-06-09 MED ORDER — ATROPINE SULFATE 1 MG/ML IV SOLN
0.5000 mg | Freq: Once | INTRAVENOUS | Status: AC | PRN
  Administered 2023-06-09: 0.5 mg via INTRAVENOUS
  Filled 2023-06-09: qty 1

## 2023-06-09 NOTE — Progress Notes (Signed)
Pt c/o nausea at end of treatment.  Fortino Sic PA notified.  Order received for pt to take Compazine 10 mg PO now.

## 2023-06-09 NOTE — Patient Instructions (Signed)
[facility]  Discharge Instructions: Thank you for choosing [facility] to provide your oncology and hematology care.   If you have a lab appointment with the Cancer Center, please go directly to the Cancer Center and check in at the registration area.  Wear comfortable clothing and clothing appropriate for easy access to any Portacath or PICC line.   We strive to give you quality time with your provider. You may need to reschedule your appointment if you arrive late (15 or more minutes).  Arriving late affects you and other patients whose appointments are after yours.  Also, if you miss three or more appointments without notifying the office, you may be dismissed from the clinic at the provider's discretion.      For prescription refill requests, have your pharmacy contact our office and allow 72 hours for refills to be completed.    Today you received the following chemotherapy and/or immunotherapy agents:  Irinotecan, Leucovorin and 5FU.      To help prevent nausea and vomiting after your treatment, we encourage you to take your nausea medication as directed.  BELOW ARE SYMPTOMS THAT SHOULD BE REPORTED IMMEDIATELY: *FEVER GREATER THAN 100.4 F (38 C) OR HIGHER *CHILLS OR SWEATING *NAUSEA AND VOMITING THAT IS NOT CONTROLLED WITH YOUR NAUSEA MEDICATION *UNUSUAL SHORTNESS OF BREATH *UNUSUAL BRUISING OR BLEEDING *URINARY PROBLEMS (pain or burning when urinating, or frequent urination) *BOWEL PROBLEMS (unusual diarrhea, constipation, pain near the anus) TENDERNESS IN MOUTH AND THROAT WITH OR WITHOUT PRESENCE OF ULCERS (sore throat, sores in mouth, or a toothache) UNUSUAL RASH, SWELLING OR PAIN  UNUSUAL VAGINAL DISCHARGE OR ITCHING   Items with * indicate a potential emergency and should be followed up as soon as possible or go to the Emergency Department if any problems should occur.  Please show the CHEMOTHERAPY ALERT CARD or  IMMUNOTHERAPY ALERT CARD at check-in to the Emergency Department and triage nurse. Should you have questions after your visit or need to cancel or reschedule your appointment, please contact [facility]  [phone] and follow the prompts.  Office hours are 8:00 a.m. to 4:30 p.m. Monday - Friday. Please note that voicemails left after 4:00 p.m. may not be returned until the following business day.  We are closed weekends and major holidays. You have access to a nurse at all times for urgent questions. Please call the main number to the clinic [phone] and follow the prompts.  For any non-urgent questions, you may also contact your provider using MyChart. We now offer e-Visits for anyone 18 and older to request care online for non-urgent symptoms. For details visit [email or web address].   Also download the MyChart app! Go to the app store, search "MyChart", open the app, select [place], and log in with your MyChart username and password.   

## 2023-06-09 NOTE — Progress Notes (Signed)
Patient will proceed to next cycle of treatment.   Oncology Nurse Navigator Documentation     06/09/2023    9:00 AM  Oncology Nurse Navigator Flowsheets  Navigator Follow Up Date: 06/23/2023  Navigator Follow Up Reason: Follow-up Appointment;Chemotherapy  Navigator Location [facility]  Navigator Encounter Type Appt/Treatment Plan Review  Patient Visit Type MedOnc  Treatment Phase Active Tx  Barriers/Navigation Needs Coordination of Care  Interventions None Required  Acuity Level 1-No Barriers  Time Spent with Patient 15

## 2023-06-09 NOTE — Patient Instructions (Signed)

## 2023-06-09 NOTE — Progress Notes (Signed)
Hematology and Oncology Follow Up Visit  Richard Bowman [id number] 1976-07-05 47 y.o. 06/09/2023  Principle Diagnosis:  Metastatic adenocarcinoma of the colon-liver metastasis -- pMMR/MSI-low -- (+) KRAS/ ERBB2(+)       Iron deficiency anemia Current Therapy:   FOLFOXIRI - s/p cycle #9  - start on  -- 12/09/2022 --oxaliplatin dropped after 04/19/2023 IV iron as indicated     Interim History:  Richard Bowman is back for follow-up.  He is doing incredibly well.  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 He has been tolerating this well now.  He is eating well.  He is having no problems with nausea or vomiting.  He is having no cough or shortness of breath.  There is been no bleeding.  He has had no mouth sores.  He has had no leg swelling.  He has had no headache.  He takes the pain medication 4 times per day for his chronic cancer related abdominal pain.   His last iron studies that were done back in Sept showed a ferritin of 185 with an iron saturation of 26%.  Currently, I would say that his performance status is probably ECOG 1.   Wt Readings from Last 3 Encounters:  06/09/23 198 lb (89.8 kg)  05/17/23 198 lb (89.8 kg)  04/19/23 190 lb 1.3 oz (86.2 kg)    Medications:  Current Outpatient Medications:    acetaminophen (TYLENOL) 500 MG tablet, Take 2 tablets (1,000 mg total) by mouth every 6 (six) hours as needed for mild pain., Disp: 30 tablet, Rfl: 0   dexamethasone (DECADRON) 4 MG tablet, Take 2 tablets (8 mg total) by mouth daily for 3 days, starting the day after chemotherapy. Take with food., Disp: 30 tablet, Rfl: 1   docusate sodium (COLACE) 100 MG capsule, Take 1 capsule (100 mg total) by mouth 2 (two) times daily., Disp: 10 capsule, Rfl: 0   lactose free nutrition (BOOST) LIQD, Take 237 mLs by mouth daily as needed (Supplement)., Disp: , Rfl:    lactulose (CHRONULAC) 10 GM/15ML solution, Take 15 mLs (10 g total) by mouth 3  (three) times daily., Disp: 236 mL, Rfl: 0   lidocaine-prilocaine (EMLA) cream, Apply 1 Application topically as needed., Disp: 30 g, Rfl: 0   loperamide (IMODIUM A-D) 2 MG tablet, Take 2 tabs by mouth with first loose stool, then 1 tablet with each additional loose stool as needed. Do not exceed 8 tablets in a 24-hour period., Disp: 100 tablet, Rfl: 3   loratadine (CLARITIN) 10 MG tablet, Take 10 mg by mouth. Takes day of injection-Udenyca, post chemotherapy., Disp: , Rfl:    LORazepam (ATIVAN) 0.5 MG tablet, Place 1 tablet (0.5 mg total) under the tongue every 6 (six) hours as needed for anxiety., Disp: 60 tablet, Rfl: 0   methocarbamol (ROBAXIN) 500 MG tablet, Take 1 tablet (500 mg total) by mouth every 6 (six) hours as needed for muscle spasms., Disp: 30 tablet, Rfl: 0   nicotine (NICODERM CQ - DOSED IN MG/24 HOURS) 21 mg/24hr patch, Place 1 patch (21 mg total) onto the skin daily., Disp: 30 patch, Rfl: 1   nystatin (MYCOSTATIN) 100000 UNIT/ML suspension, Take 5 mLs (500,000 Units total) by mouth 4 (four) times daily., Disp: 473 mL, Rfl: 1   ondansetron (ZOFRAN) 8 MG tablet, Take 1 tablet (8 mg total) by mouth every 8 (eight) hours as needed for nausea or vomiting. Start on the third day after  cisplatin., Disp: 30 tablet, Rfl: 2   oxyCODONE (OXY IR/ROXICODONE) 5 MG immediate release tablet, Take 1 tablet (5 mg total) by mouth every 6 (six) hours as needed for severe pain., Disp: 60 tablet, Rfl: 0   pantoprazole (PROTONIX) 40 MG tablet, Take 1 tablet (40 mg total) by mouth 2 (two) times daily., Disp: 60 tablet, Rfl: 6   polyethylene glycol (MIRALAX / GLYCOLAX) 17 g packet, Take 17 g by mouth daily as needed for mild constipation., Disp: 14 each, Rfl: 0   prochlorperazine (COMPAZINE) 10 MG tablet, Take 1 tablet (10 mg total) by mouth every 6 (six) hours as needed for nausea or vomiting., Disp: 30 tablet, Rfl: 3  Allergies: No Known Allergies  Past Medical History, Surgical history, Social history,  and Family History were reviewed and updated.  Review of Systems: Review of Systems  Constitutional: Negative.   HENT:  Negative.    Eyes: Negative.   Respiratory: Negative.    Cardiovascular: Negative.   Gastrointestinal:  Positive for abdominal pain and constipation.  Endocrine: Negative.   Genitourinary: Negative.    Musculoskeletal: Negative.   Skin: Negative.   Neurological: Negative.   Hematological: Negative.   Psychiatric/Behavioral: Negative.      Physical Exam: Vitals:   06/09/23 0900  BP: 118/74  Pulse: 74  Resp: 18  Temp: 98.3 F (36.8 C)  SpO2: 98%   Wt Readings from Last 3 Encounters:  06/09/23 198 lb (89.8 kg)  05/17/23 198 lb (89.8 kg)  04/19/23 190 lb 1.3 oz (86.2 kg)    Physical Exam Vitals reviewed.  HENT:     Head: Normocephalic and atraumatic.  Eyes:     Pupils: Pupils are equal, round, and reactive to light.  Cardiovascular:     Rate and Rhythm: Normal rate and regular rhythm.     Heart sounds: Normal heart sounds.  Pulmonary:     Effort: Pulmonary effort is normal.     Breath sounds: Normal breath sounds.  Abdominal:     General: Bowel sounds are normal.     Palpations: Abdomen is soft.     Comments: Abdominal exam shows a well-healed laparotomy scar.  He has no fluid wave.  There is no guarding or rebound tenderness.    Musculoskeletal:        General: No tenderness or deformity. Normal range of motion.     Cervical back: Normal range of motion.  Lymphadenopathy:     Cervical: No cervical adenopathy.  Skin:    General: Skin is warm and dry.     Findings: No erythema or rash.  Neurological:     Mental Status: He is alert and oriented to person, place, and time.  Psychiatric:        Behavior: Behavior normal.        Thought Content: Thought content normal.        Judgment: Judgment normal.      Lab Results  Component Value Date   WBC 4.4 06/09/2023   HGB 14.3 06/09/2023   HCT 42.1 06/09/2023   MCV 94.6 06/09/2023   PLT  101 (L) 06/09/2023     Chemistry      Component Value Date/Time   NA 143 05/17/2023 1030   K 4.2 05/17/2023 1030   CL 108 05/17/2023 1030   CO2 29 05/17/2023 1030   BUN 13 05/17/2023 1030   CREATININE 0.83 05/17/2023 1030      Component Value Date/Time   CALCIUM 9.9 05/17/2023 1030   ALKPHOS  178 (H) 05/17/2023 1030   AST 39 05/17/2023 1030   ALT 40 05/17/2023 1030   BILITOT 0.3 05/17/2023 1030      Impression and Plan: Richard Bowman is a very nice 47 year old white male.  He was found to have metastatic colon cancer. Today he is here for consideration of treatment number 10/12. After this the plan is for him to transition onto xeloda/avastin.   Labs reviewed and are acceptable for treatment today CEA/Iron studies pending I will review PMPD and refill his pain medication   Disposition Treatment 10 today FOLFOXIRI RTC Day 3 pump removal RTC 2 weeks MD, labs, Cycle 11 FOLFOXIRI-[place]   Richard Bowman [place], PA-C 10/9/20249:11 AM

## 2023-06-10 ENCOUNTER — Other Ambulatory Visit: Payer: Self-pay

## 2023-06-11 ENCOUNTER — Inpatient Hospital Stay: Payer: Medicaid Other

## 2023-06-11 VITALS — BP 121/75 | HR 61 | Temp 98.3°F | Resp 17

## 2023-06-11 DIAGNOSIS — Z5111 Encounter for antineoplastic chemotherapy: Secondary | ICD-10-CM | POA: Diagnosis not present

## 2023-06-11 DIAGNOSIS — C787 Secondary malignant neoplasm of liver and intrahepatic bile duct: Secondary | ICD-10-CM

## 2023-06-11 MED ORDER — HEPARIN SOD (PORK) LOCK FLUSH 100 UNIT/ML IV SOLN
500.0000 [IU] | Freq: Once | INTRAVENOUS | Status: AC | PRN
  Administered 2023-06-11: 500 [IU]

## 2023-06-11 MED ORDER — PEGFILGRASTIM-CBQV 6 MG/0.6ML ~~LOC~~ SOSY
6.0000 mg | PREFILLED_SYRINGE | Freq: Once | SUBCUTANEOUS | Status: AC
  Administered 2023-06-11: 6 mg via SUBCUTANEOUS
  Filled 2023-06-11: qty 0.6

## 2023-06-11 MED ORDER — SODIUM CHLORIDE 0.9% FLUSH
10.0000 mL | INTRAVENOUS | Status: DC | PRN
  Administered 2023-06-11: 10 mL

## 2023-06-15 ENCOUNTER — Other Ambulatory Visit: Payer: Self-pay

## 2023-06-15 ENCOUNTER — Telehealth: Payer: Self-pay

## 2023-06-15 ENCOUNTER — Other Ambulatory Visit: Payer: Self-pay | Admitting: Hematology & Oncology

## 2023-06-15 ENCOUNTER — Emergency Department (HOSPITAL_BASED_OUTPATIENT_CLINIC_OR_DEPARTMENT_OTHER)
Admission: EM | Admit: 2023-06-15 | Discharge: 2023-06-15 | Disposition: A | Discharge status: Home / Self Care | Payer: Medicaid Other | Attending: Emergency Medicine | Admitting: Emergency Medicine

## 2023-06-15 ENCOUNTER — Encounter (HOSPITAL_BASED_OUTPATIENT_CLINIC_OR_DEPARTMENT_OTHER): Payer: Self-pay | Admitting: Urology

## 2023-06-15 DIAGNOSIS — R109 Unspecified abdominal pain: Secondary | ICD-10-CM | POA: Diagnosis not present

## 2023-06-15 DIAGNOSIS — Z85038 Personal history of other malignant neoplasm of large intestine: Secondary | ICD-10-CM | POA: Insufficient documentation

## 2023-06-15 DIAGNOSIS — M1990 Unspecified osteoarthritis, unspecified site: Secondary | ICD-10-CM | POA: Insufficient documentation

## 2023-06-15 DIAGNOSIS — C189 Malignant neoplasm of colon, unspecified: Secondary | ICD-10-CM

## 2023-06-15 DIAGNOSIS — M545 Low back pain, unspecified: Secondary | ICD-10-CM | POA: Diagnosis present

## 2023-06-15 DIAGNOSIS — M898X9 Other specified disorders of bone, unspecified site: Secondary | ICD-10-CM

## 2023-06-15 LAB — CBC WITH DIFFERENTIAL/PLATELET
Abs Immature Granulocytes: 2.48 10*3/uL — ABNORMAL HIGH (ref 0.00–0.07)
Basophils Absolute: 0 10*3/uL (ref 0.0–0.1)
Basophils Relative: 0 %
Eosinophils Absolute: 0.1 10*3/uL (ref 0.0–0.5)
Eosinophils Relative: 0 %
HCT: 41.9 % (ref 39.0–52.0)
Hemoglobin: 14 g/dL (ref 13.0–17.0)
Immature Granulocytes: 7 %
Lymphocytes Relative: 9 %
Lymphs Abs: 3.5 10*3/uL (ref 0.7–4.0)
MCH: 31.7 pg (ref 26.0–34.0)
MCHC: 33.4 g/dL (ref 30.0–36.0)
MCV: 95 fL (ref 80.0–100.0)
Monocytes Absolute: 2.5 10*3/uL — ABNORMAL HIGH (ref 0.1–1.0)
Monocytes Relative: 7 %
Neutro Abs: 29.2 10*3/uL — ABNORMAL HIGH (ref 1.7–7.7)
Neutrophils Relative %: 77 %
Platelets: 160 10*3/uL (ref 150–400)
RBC: 4.41 MIL/uL (ref 4.22–5.81)
RDW: 14.1 % (ref 11.5–15.5)
Smear Review: NORMAL
WBC: 37.4 10*3/uL — ABNORMAL HIGH (ref 4.0–10.5)
nRBC: 0.1 % (ref 0.0–0.2)

## 2023-06-15 LAB — COMPREHENSIVE METABOLIC PANEL
ALT: 73 U/L — ABNORMAL HIGH (ref 0–44)
AST: 50 U/L — ABNORMAL HIGH (ref 15–41)
Albumin: 4 g/dL (ref 3.5–5.0)
Alkaline Phosphatase: 236 U/L — ABNORMAL HIGH (ref 38–126)
Anion gap: 11 (ref 5–15)
BUN: 23 mg/dL — ABNORMAL HIGH (ref 6–20)
CO2: 26 mmol/L (ref 22–32)
Calcium: 9.9 mg/dL (ref 8.9–10.3)
Chloride: 103 mmol/L (ref 98–111)
Creatinine, Ser: 1.02 mg/dL (ref 0.61–1.24)
GFR, Estimated: >60 mL/min (ref >60)
Glucose, Bld: 90 mg/dL (ref 70–99)
Potassium: 3.7 mmol/L (ref 3.5–5.1)
Sodium: 140 mmol/L (ref 135–145)
Total Bilirubin: 0.7 mg/dL (ref 0.3–1.2)
Total Protein: 7.3 g/dL (ref 6.5–8.1)

## 2023-06-15 LAB — URINALYSIS, ROUTINE W REFLEX MICROSCOPIC
Bilirubin Urine: NEGATIVE
Glucose, UA: NEGATIVE mg/dL
Hgb urine dipstick: NEGATIVE
Ketones, ur: NEGATIVE mg/dL
Leukocytes,Ua: NEGATIVE
Nitrite: NEGATIVE
Protein, ur: NEGATIVE mg/dL
Specific Gravity, Urine: 1.02 (ref 1.005–1.030)
pH: 7 (ref 5.0–8.0)

## 2023-06-15 LAB — LACTIC ACID, PLASMA: Lactic Acid, Venous: 1.4 mmol/L (ref 0.5–1.9)

## 2023-06-15 MED ORDER — ONDANSETRON HCL 4 MG/2ML IJ SOLN
4.0000 mg | Freq: Once | INTRAMUSCULAR | Status: AC
  Administered 2023-06-15: 4 mg via INTRAVENOUS
  Filled 2023-06-15: qty 2

## 2023-06-15 MED ORDER — HEPARIN SOD (PORK) LOCK FLUSH 100 UNIT/ML IV SOLN
500.0000 [IU] | Freq: Once | INTRAVENOUS | Status: AC
  Administered 2023-06-15: 500 [IU]
  Filled 2023-06-15: qty 5

## 2023-06-15 MED ORDER — HYDROMORPHONE HCL 1 MG/ML IJ SOLN
1.0000 mg | Freq: Once | INTRAMUSCULAR | Status: AC
  Administered 2023-06-15: 1 mg via INTRAVENOUS
  Filled 2023-06-15: qty 1

## 2023-06-15 NOTE — Discharge Instructions (Signed)
Please read and follow all provided instructions.  Your diagnoses today include:  1. Ostealgia    Tests performed today include: Complete blood count: White blood cell count was elevated likely due to the medication you received last week Complete metabolic panel: Was consistent with previous Urine test: No signs of UTI Vital signs. See below for your results today.   Medications prescribed:  None  Take any prescribed medications only as directed.  Home care instructions:  Follow any educational materials contained in this packet.  Continue home pain medications on a scheduled basis.  Follow-up instructions: Please follow-up with your oncologist for further evaluation of your symptoms.   Return instructions:  Please return to the Emergency Department if you experience worsening symptoms.  Return if you develop a fever above 100.4 F, have uncontrolled back pain, have difficulty urinating or stool incontinence, difficulty walking, weakness in your extremities Please return if you have any other emergent concerns.  Additional Information:  Your vital signs today were: BP (!) 140/87   Pulse 80   Temp 98.7 F (37.1 C) (Oral)   Resp 19   Ht 5\' 10"  (1.778 m)   Wt 89.8 kg   SpO2 99%   BMI 28.41 kg/m  If your blood pressure (BP) was elevated above 135/85 this visit, please have this repeated by your doctor within one month. --------------

## 2023-06-15 NOTE — ED Notes (Signed)
D/c paperwork reviewed with pt, including follow up care.  All questions and/or concerns addressed at time of d/c.  No further needs expressed. . Pt verbalized understanding, Ambulatory without assistance to ED exit, NAD.

## 2023-06-15 NOTE — ED Triage Notes (Signed)
Sent by Dr. Wynona Dove , gets injections after cancer treatments and states after injection started having throbbing pain to mid back radiating into abdomen this am  Denies urinary complaints

## 2023-06-15 NOTE — ED Notes (Addendum)
Pt aware urine sample is needed, urinal provided.

## 2023-06-15 NOTE — Telephone Encounter (Signed)
Patient called stating he has back pain described as throbbing and radiating to his hips, states it also seems to be going up his spine to his head. States the pain started this morning and the oxycodone he has is not helping it. States hit is difficult to walk, stand and sit. Patient received treatment last week. MD notified, patient informed to go to ED per Dr.Ennever. pt verbalized and will go to ED now.

## 2023-06-15 NOTE — ED Provider Notes (Signed)
[place] [facility] AT [facility] Provider Note   CSN: [id number] Arrival date & time: 06/15/23  1254     History  Chief Complaint  Patient presents with   Back Pain    Richard Bowman is a 47 y.o. male.  Patient presents to the urgency department today for evaluation of lower mid back pain starting this morning.  Patient has a history of colon cancer.  He has had sigmoidectomy and has a port.  He is undergoing chemotherapy, last dose completed last week.  He received a injection of pegfilgrastim on Friday (3 days ago).  After his last round of chemotherapy he also had this and had similar pain which occurred several days after the injection.  He did take Claritin after receiving this dose.  He states that he went upstairs to the oncology office today and was referred down here for further evaluation.  Patient denies associated fevers, URI symptoms, chest pain or cough.  He has not had vomiting or diarrhea.  No urinary symptoms.  He does have oxycodone IR 5 mg tablets which he takes up to 4 times per day for abdominal pain related to the surgery.  He states that over the past several months the abdominal pain has been better controlled.  Abdominal pain is controlled to current.       Home Medications Prior to Admission medications   Medication Sig Start Date End Date Taking? Authorizing Provider  acetaminophen (TYLENOL) 500 MG tablet Take 2 tablets (1,000 mg total) by mouth every 6 (six) hours as needed for mild pain. 11/22/22   Meuth, Brooke A, PA-C  dexamethasone (DECADRON) 4 MG tablet Take 2 tablets (8 mg total) by mouth daily for 3 days, starting the day after chemotherapy. Take with food. 05/17/23   Josph Macho, MD  docusate sodium (COLACE) 100 MG capsule Take 1 capsule (100 mg total) by mouth 2 (two) times daily. 11/23/22   Kathlen Mody, MD  lactose free nutrition (BOOST) LIQD Take 237 mLs by mouth daily as needed (Supplement).    [provider]  lactulose (CHRONULAC) 10 GM/15ML solution Take 15 mLs (10 g total) by mouth 3 (three) times daily. 12/04/22   Josph Macho, MD  lidocaine-prilocaine (EMLA) cream Apply 1 Application topically as needed. 12/21/22   Josph Macho, MD  loperamide (IMODIUM A-D) 2 MG tablet Take 2 tabs by mouth with first loose stool, then 1 tablet with each additional loose stool as needed. Do not exceed 8 tablets in a 24-hour period. 12/01/22   Josph Macho, MD  loratadine (CLARITIN) 10 MG tablet Take 10 mg by mouth. Takes day of injection-Udenyca, post chemotherapy.    [provider]  LORazepam (ATIVAN) 0.5 MG tablet Place 1 tablet (0.5 mg total) under the tongue every 6 (six) hours as needed for anxiety. 04/21/23   Josph Macho, MD  methocarbamol (ROBAXIN) 500 MG tablet Take 1 tablet (500 mg total) by mouth every 6 (six) hours as needed for muscle spasms. 02/24/23   Josph Macho, MD  nicotine (NICODERM CQ - DOSED IN MG/24 HOURS) 21 mg/24hr patch Place 1 patch (21 mg total) onto the skin daily. 11/23/22 11/23/23  Kathlen Mody, MD  nystatin (MYCOSTATIN) 100000 UNIT/ML suspension Take 5 mLs (500,000 Units total) by mouth 4 (four) times daily. 12/16/22   Erenest Blank, NP  ondansetron (ZOFRAN) 8 MG tablet Take 1 tablet (8 mg total) by mouth every 8 (eight) hours as needed  for nausea or vomiting. Start on the third day after cisplatin. 02/26/23   Josph Macho, MD  oxyCODONE (OXY IR/ROXICODONE) 5 MG immediate release tablet Take 1 tablet (5 mg total) by mouth every 6 (six) hours as needed for severe pain. 06/09/23   Josph Macho, MD  pantoprazole (PROTONIX) 40 MG tablet Take 1 tablet (40 mg total) by mouth 2 (two) times daily. 01/12/23   Josph Macho, MD  polyethylene glycol (MIRALAX / GLYCOLAX) 17 g packet Take 17 g by mouth daily as needed for mild constipation. 11/23/22   Kathlen Mody, MD  prochlorperazine (COMPAZINE) 10 MG tablet Take 1 tablet (10 mg total) by mouth every 6 (six) hours as  needed for nausea or vomiting. 01/27/23   Josph Macho, MD      Allergies    Patient has no known allergies.    Review of Systems   Review of Systems  Physical Exam Updated Vital Signs BP (!) 163/70 (BP Location: Left Arm)   Pulse 86   Temp 99.8 F (37.7 C)   Resp 18   Ht 5\' 10"  (1.778 m)   Wt 89.8 kg   SpO2 100%   BMI 28.41 kg/m  Physical Exam Vitals and nursing note reviewed.  Constitutional:      General: He is in acute distress.     Appearance: He is well-developed.     Comments: Patient, uncomfortable appearing at times, seemingly to have waves of pain which come and go.  HENT:     Head: Normocephalic and atraumatic.  Eyes:     General:        Right eye: No discharge.        Left eye: No discharge.     Conjunctiva/sclera: Conjunctivae normal.  Cardiovascular:     Rate and Rhythm: Normal rate and regular rhythm.     Heart sounds: Normal heart sounds.  Pulmonary:     Effort: Pulmonary effort is normal.     Breath sounds: Normal breath sounds.  Abdominal:     Palpations: Abdomen is soft.     Tenderness: There is no abdominal tenderness. There is no guarding or rebound.  Musculoskeletal:     Cervical back: Normal range of motion and neck supple. No tenderness or bony tenderness.     Thoracic back: No tenderness or bony tenderness.     Lumbar back: Tenderness and bony tenderness present.     Comments: Tenderness to palpation, mild to moderate, over the lower lumbar and sacral areas.  Skin:    General: Skin is warm and dry.  Neurological:     Mental Status: He is alert.     ED Results / Procedures / Treatments   Labs (all labs ordered are listed, but only abnormal results are displayed) Labs Reviewed  CBC WITH DIFFERENTIAL/PLATELET - Abnormal; Notable for the following components:      Result Value   WBC 37.4 (*)    Neutro Abs 29.2 (*)    Monocytes Absolute 2.5 (*)    Abs Immature Granulocytes 2.48 (*)    All other components within normal limits   COMPREHENSIVE METABOLIC PANEL - Abnormal; Notable for the following components:   BUN 23 (*)    AST 50 (*)    ALT 73 (*)    Alkaline Phosphatase 236 (*)    All other components within normal limits  LACTIC ACID, PLASMA  URINALYSIS, ROUTINE W REFLEX MICROSCOPIC    EKG None  Radiology No results found.  Procedures Procedures    Medications Ordered in ED Medications  HYDROmorphone (DILAUDID) injection 1 mg (1 mg Intravenous Given 06/15/23 1358)  ondansetron (ZOFRAN) injection 4 mg (4 mg Intravenous Given 06/15/23 1358)    ED Course/ Medical Decision Making/ A&P    Patient seen and examined. History obtained directly from patient.  I also reviewed previous medical records including oncology and surgery notes.  Labs/EKG: Ordered CBC, CMP, UA, lactate  Imaging: None ordered  Medications/Fluids: Ordered: IV Dilaudid 1 mg, IV Zofran.   Most recent vital signs reviewed and are as follows: BP (!) 163/70 (BP Location: Left Arm)   Pulse 86   Temp 99.8 F (37.7 C)   Resp 18   Ht 5\' 10"  (1.778 m)   Wt 89.8 kg   SpO2 100%   BMI 28.41 kg/m   Initial impression: Lower back pain, likely related to recent chemotherapy and Neulasta injection.  No symptoms which strongly suggest cauda equina.  3:12 PM Reassessment performed. Patient appears more comfortable. Reported that the 'throbbing pain' is much improved.   Labs personally reviewed and interpreted including: CBC with differential with elevated white blood cell count at 37.4 with left shift, likely related to recent Neulasta injection; CMP with mildly chronic elevated transaminases consistent with previous; lactate normal.  Reviewed pertinent lab work and imaging with patient at bedside. Questions answered.   Most current vital signs reviewed and are as follows: BP (!) 140/87   Pulse 80   Temp 98.7 F (37.1 C) (Oral)   Resp 19   Ht 5\' 10"  (1.778 m)   Wt 89.8 kg   SpO2 99%   BMI 28.41 kg/m   I discussed case and  symptoms with on-call oncology Dr. Mosetta Putt.  Discussed labs.  She looked at recent imaging.  Agree that this is likely ostealgia from recent pegfilgrastim injection.  No additional recommendations at this time other than pain control.   3:54 PM Reassessment performed. Patient appears stable.  Pain remains reasonably controlled.  Labs personally reviewed and interpreted including: UA without signs of infection  Reviewed pertinent lab work and imaging with patient at bedside. Questions answered.   Most current vital signs reviewed and are as follows: BP (!) 140/87   Pulse 80   Temp 98.7 F (37.1 C) (Oral)   Resp 19   Ht 5\' 10"  (1.778 m)   Wt 89.8 kg   SpO2 99%   BMI 28.41 kg/m   Plan: Discharge to home.   Prescriptions written for: None  Other home care instructions discussed: Continued home treatment, scheduled pain medications  ED return instructions discussed: Worsening uncontrolled pain, fever, weakness  Follow-up instructions discussed: Patient encouraged to follow-up with their oncologist as planned.                                 Medical Decision Making Amount and/or Complexity of Data Reviewed Labs: ordered.  Risk Prescription drug management.   Patient with back pain, approximately 4 days after receiving Neulasta injection.  No neurologic deficits.  No known history of disease in the spine, low concern for cauda equina, pathologic fracture.  Patient does have elevated white blood cell count consistent with recent injection.  No UTI.  Lactate normal.  No fever here.  No fevers at home.  Low concern for sepsis.  Patient was given pain control in the emergency department with reasonable improvement.  He will continue home medications.  The  patient's vital signs, pertinent lab work and imaging were reviewed and interpreted as discussed in the ED course. Hospitalization was considered for further testing, treatments, or serial exams/observation. However as patient is  well-appearing, has a stable exam, and reassuring studies today, I do not feel that they warrant admission at this time. This plan was discussed with the patient who verbalizes agreement and comfort with this plan and seems reliable and able to return to the Emergency Department with worsening or changing symptoms.          Final Clinical Impression(s) / ED Diagnoses Final diagnoses:  Ostealgia    Rx / DC Orders ED Discharge Orders     None         Renne Crigler, PA-C 06/15/23 1556    Virgina Norfolk, DO 06/18/23 1555

## 2023-06-21 ENCOUNTER — Other Ambulatory Visit: Payer: Self-pay

## 2023-06-21 ENCOUNTER — Emergency Department (HOSPITAL_BASED_OUTPATIENT_CLINIC_OR_DEPARTMENT_OTHER)
Admission: EM | Admit: 2023-06-21 | Discharge: 2023-06-21 | Discharge status: Against Medical Advice | Payer: Medicaid Other | Attending: Emergency Medicine | Admitting: Emergency Medicine

## 2023-06-21 ENCOUNTER — Telehealth: Payer: Self-pay

## 2023-06-21 ENCOUNTER — Encounter (HOSPITAL_BASED_OUTPATIENT_CLINIC_OR_DEPARTMENT_OTHER): Payer: Self-pay | Admitting: Emergency Medicine

## 2023-06-21 DIAGNOSIS — R1012 Left upper quadrant pain: Secondary | ICD-10-CM | POA: Insufficient documentation

## 2023-06-21 DIAGNOSIS — Z5321 Procedure and treatment not carried out due to patient leaving prior to being seen by health care provider: Secondary | ICD-10-CM | POA: Diagnosis not present

## 2023-06-21 LAB — CBC
HCT: 45.8 % (ref 39.0–52.0)
Hemoglobin: 15.4 g/dL (ref 13.0–17.0)
MCH: 31.8 pg (ref 26.0–34.0)
MCHC: 33.6 g/dL (ref 30.0–36.0)
MCV: 94.4 fL (ref 80.0–100.0)
Platelets: 126 10*3/uL — ABNORMAL LOW (ref 150–400)
RBC: 4.85 MIL/uL (ref 4.22–5.81)
RDW: 14.8 % (ref 11.5–15.5)
WBC: 22 10*3/uL — ABNORMAL HIGH (ref 4.0–10.5)
nRBC: 0 % (ref 0.0–0.2)

## 2023-06-21 LAB — COMPREHENSIVE METABOLIC PANEL
ALT: 98 U/L — ABNORMAL HIGH (ref 0–44)
AST: 51 U/L — ABNORMAL HIGH (ref 15–41)
Albumin: 4.2 g/dL (ref 3.5–5.0)
Alkaline Phosphatase: 266 U/L — ABNORMAL HIGH (ref 38–126)
Anion gap: 12 (ref 5–15)
BUN: 22 mg/dL — ABNORMAL HIGH (ref 6–20)
CO2: 24 mmol/L (ref 22–32)
Calcium: 9.7 mg/dL (ref 8.9–10.3)
Chloride: 99 mmol/L (ref 98–111)
Creatinine, Ser: 1.25 mg/dL — ABNORMAL HIGH (ref 0.61–1.24)
GFR, Estimated: >60 mL/min (ref >60)
Glucose, Bld: 110 mg/dL — ABNORMAL HIGH (ref 70–99)
Potassium: 4.3 mmol/L (ref 3.5–5.1)
Sodium: 135 mmol/L (ref 135–145)
Total Bilirubin: 0.7 mg/dL (ref 0.3–1.2)
Total Protein: 8.1 g/dL (ref 6.5–8.1)

## 2023-06-21 LAB — URINALYSIS, ROUTINE W REFLEX MICROSCOPIC
Bilirubin Urine: NEGATIVE
Glucose, UA: NEGATIVE mg/dL
Ketones, ur: NEGATIVE mg/dL
Leukocytes,Ua: NEGATIVE
Nitrite: NEGATIVE
Protein, ur: NEGATIVE mg/dL
Specific Gravity, Urine: >=1.03 (ref 1.005–1.030)
pH: 6 (ref 5.0–8.0)

## 2023-06-21 LAB — LIPASE, BLOOD: Lipase: 52 U/L — ABNORMAL HIGH (ref 11–51)

## 2023-06-21 LAB — URINALYSIS, MICROSCOPIC (REFLEX)

## 2023-06-21 MED ORDER — OXYCODONE-ACETAMINOPHEN 5-325 MG PO TABS
1.0000 | ORAL_TABLET | ORAL | Status: DC | PRN
  Administered 2023-06-21: 1 via ORAL
  Filled 2023-06-21: qty 1

## 2023-06-21 NOTE — Telephone Encounter (Signed)
Received voicemail from patient earlier today stating he was having pain around his rib cage that radiates to his back. When pt called back he was already in the ER to have the pain evaluated. Pt asking while on phone when is the earliest he can have  his pain medications refilled.  Pt educated on his next appointment day and time.  Pt aware to call with any updates on his care from the ER. Pt verbalized understanding and had no further questions.

## 2023-06-21 NOTE — ED Notes (Signed)
NA x3 for room 

## 2023-06-21 NOTE — ED Notes (Signed)
NA x 1 for room

## 2023-06-21 NOTE — ED Notes (Signed)
NA x 2 for room 

## 2023-06-21 NOTE — ED Triage Notes (Signed)
Pt reports he has been having LUQ ABD pain since last night, denies any GI changes, n/v/d; denies any CP, SHoB, dizziness  Hx of colon CA w active chemo, was seen last week for similar pain in low back

## 2023-06-22 ENCOUNTER — Encounter (HOSPITAL_BASED_OUTPATIENT_CLINIC_OR_DEPARTMENT_OTHER): Payer: Self-pay | Admitting: Pediatrics

## 2023-06-22 ENCOUNTER — Emergency Department (HOSPITAL_BASED_OUTPATIENT_CLINIC_OR_DEPARTMENT_OTHER): Payer: Medicaid Other

## 2023-06-22 ENCOUNTER — Other Ambulatory Visit: Payer: Self-pay

## 2023-06-22 ENCOUNTER — Emergency Department (HOSPITAL_BASED_OUTPATIENT_CLINIC_OR_DEPARTMENT_OTHER)
Admission: EM | Admit: 2023-06-22 | Discharge: 2023-06-22 | Disposition: A | Discharge status: Home / Self Care | Payer: Medicaid Other | Attending: Emergency Medicine | Admitting: Emergency Medicine

## 2023-06-22 DIAGNOSIS — J189 Pneumonia, unspecified organism: Secondary | ICD-10-CM

## 2023-06-22 DIAGNOSIS — Z85038 Personal history of other malignant neoplasm of large intestine: Secondary | ICD-10-CM | POA: Insufficient documentation

## 2023-06-22 DIAGNOSIS — J181 Lobar pneumonia, unspecified organism: Secondary | ICD-10-CM | POA: Diagnosis not present

## 2023-06-22 DIAGNOSIS — R0781 Pleurodynia: Secondary | ICD-10-CM | POA: Diagnosis present

## 2023-06-22 DIAGNOSIS — D72829 Elevated white blood cell count, unspecified: Secondary | ICD-10-CM | POA: Insufficient documentation

## 2023-06-22 DIAGNOSIS — R7401 Elevation of levels of liver transaminase levels: Secondary | ICD-10-CM | POA: Diagnosis not present

## 2023-06-22 LAB — TROPONIN I (HIGH SENSITIVITY)
Troponin I (High Sensitivity): 2 ng/L (ref <18)
Troponin I (High Sensitivity): 3 ng/L (ref <18)

## 2023-06-22 LAB — LIPASE, BLOOD: Lipase: 50 U/L (ref 11–51)

## 2023-06-22 LAB — HEPATIC FUNCTION PANEL
ALT: 82 U/L — ABNORMAL HIGH (ref 0–44)
AST: 44 U/L — ABNORMAL HIGH (ref 15–41)
Albumin: 3.9 g/dL (ref 3.5–5.0)
Alkaline Phosphatase: 239 U/L — ABNORMAL HIGH (ref 38–126)
Bilirubin, Direct: 0.1 mg/dL (ref 0.0–0.2)
Indirect Bilirubin: 0.7 mg/dL (ref 0.3–0.9)
Total Bilirubin: 0.8 mg/dL (ref 0.3–1.2)
Total Protein: 7.5 g/dL (ref 6.5–8.1)

## 2023-06-22 LAB — CBC
HCT: 45.5 % (ref 39.0–52.0)
Hemoglobin: 15.3 g/dL (ref 13.0–17.0)
MCH: 31.8 pg (ref 26.0–34.0)
MCHC: 33.6 g/dL (ref 30.0–36.0)
MCV: 94.6 fL (ref 80.0–100.0)
Platelets: 125 10*3/uL — ABNORMAL LOW (ref 150–400)
RBC: 4.81 MIL/uL (ref 4.22–5.81)
RDW: 14.6 % (ref 11.5–15.5)
WBC: 11.1 10*3/uL — ABNORMAL HIGH (ref 4.0–10.5)
nRBC: 0 % (ref 0.0–0.2)

## 2023-06-22 LAB — BASIC METABOLIC PANEL
Anion gap: 10 (ref 5–15)
BUN: 24 mg/dL — ABNORMAL HIGH (ref 6–20)
CO2: 22 mmol/L (ref 22–32)
Calcium: 9.3 mg/dL (ref 8.9–10.3)
Chloride: 102 mmol/L (ref 98–111)
Creatinine, Ser: 0.83 mg/dL (ref 0.61–1.24)
GFR, Estimated: >60 mL/min (ref >60)
Glucose, Bld: 166 mg/dL — ABNORMAL HIGH (ref 70–99)
Potassium: 4.1 mmol/L (ref 3.5–5.1)
Sodium: 134 mmol/L — ABNORMAL LOW (ref 135–145)

## 2023-06-22 MED ORDER — AZITHROMYCIN 250 MG PO TABS
250.0000 mg | ORAL_TABLET | Freq: Every day | ORAL | 0 refills | Status: AC
  Filled 2023-06-22: qty 6, 5d supply, fill #0

## 2023-06-22 MED ORDER — IOHEXOL 350 MG/ML SOLN
100.0000 mL | Freq: Once | INTRAVENOUS | Status: AC | PRN
  Administered 2023-06-22: 100 mL via INTRAVENOUS

## 2023-06-22 MED ORDER — AZITHROMYCIN 250 MG PO TABS
500.0000 mg | ORAL_TABLET | Freq: Once | ORAL | Status: AC
  Administered 2023-06-22: 500 mg via ORAL
  Filled 2023-06-22: qty 2

## 2023-06-22 MED ORDER — AMOXICILLIN-POT CLAVULANATE 875-125 MG PO TABS
1.0000 | ORAL_TABLET | Freq: Two times a day (BID) | ORAL | 0 refills | Status: DC
  Filled 2023-06-22 – 2023-06-23 (×2): qty 14, 7d supply, fill #0

## 2023-06-22 MED ORDER — AMOXICILLIN-POT CLAVULANATE 875-125 MG PO TABS
1.0000 | ORAL_TABLET | Freq: Once | ORAL | Status: AC
  Administered 2023-06-22: 1 via ORAL
  Filled 2023-06-22: qty 1

## 2023-06-22 MED ORDER — HYDROMORPHONE HCL 1 MG/ML IJ SOLN
1.0000 mg | Freq: Once | INTRAMUSCULAR | Status: AC
  Administered 2023-06-22: 1 mg via INTRAVENOUS
  Filled 2023-06-22: qty 1

## 2023-06-22 MED ORDER — HEPARIN SOD (PORK) LOCK FLUSH 100 UNIT/ML IV SOLN
INTRAVENOUS | Status: AC
  Administered 2023-06-22: 500 [IU]
  Filled 2023-06-22: qty 5

## 2023-06-22 NOTE — Discharge Instructions (Addendum)
Please follow-up with your primary care provider and oncologist in regards to recent symptoms and ER visit.  Today your labs are reassuring however your CT scan does show you have pneumonia in both of your lungs which could be contributing to your pain.  I have given you 1 dose of your antibiotics here and will prescribe the rest.  If symptoms change or worsen please return to the ER.  Radiology also recommends having a repeat CT chest done in 3 months to reevaluate the infiltrates found today.  The prescription for Z-Pak says to take to pills of the medication on the first day however you to these 2 pills in the ER.  Please just take 1 daily for the next few days until you finish the prescription.

## 2023-06-22 NOTE — ED Notes (Signed)
Report rec'd from prev RN 

## 2023-06-22 NOTE — ED Triage Notes (Signed)
C/O left sided rib pain, now going to the shoulder. Stated he was here yesterday and had blood work but ended up leaving.

## 2023-06-22 NOTE — ED Provider Notes (Signed)
[facility] Provider Note   CSN: [id number] Arrival date & time: 06/22/23  1101     History  Chief Complaint  Patient presents with   Chest Pain    LAWERNCE Bowman is a 47 y.o. male history of colon cancer mets to liver, status post colon resection, history of septic ulcer presented with left upper quadrant/left rib pain for the past few days.  Patient was recently here for low back pain that was ultimately diagnosed as bone pain from his infusion as per the oncologist on-call at the time however patient states this pain is different.  Patient denies any chest pain or shortness of breath, hemoptysis, nausea/vomiting, inability to eat, constipation, diarrhea, dysuria, pain with positional changes, alleviating factors or aggravating factors.  Patient states he ran out of his Percocets at home and called his oncologist however was not able to be seen in a timely manner and so came here.  Home Medications Prior to Admission medications   Medication Sig Start Date End Date Taking? Authorizing Provider  amoxicillin-clavulanate (AUGMENTIN) 875-125 MG tablet Take 1 tablet by mouth every 12 (twelve) hours. 06/22/23  Yes Shomari Scicchitano, Beverly Gust, PA-C  azithromycin (ZITHROMAX) 250 MG tablet Take 1 tablet (250 mg total) by mouth daily for 5 days. Take first 2 tablets together, then 1 every day until finished. 06/22/23 06/27/23 Yes Taneil Lazarus, Beverly Gust, PA-C  acetaminophen (TYLENOL) 500 MG tablet Take 2 tablets (1,000 mg total) by mouth every 6 (six) hours as needed for mild pain. 11/22/22   Meuth, Brooke A, PA-C  dexamethasone (DECADRON) 4 MG tablet Take 2 tablets (8 mg total) by mouth daily for 3 days, starting the day after chemotherapy. Take with food. 05/17/23   Josph Macho, MD  docusate sodium (COLACE) 100 MG capsule Take 1 capsule (100 mg total) by mouth 2 (two) times daily. 11/23/22   Kathlen Mody, MD  lactose free nutrition (BOOST) LIQD Take 237 mLs by  mouth daily as needed (Supplement).    [provider]  lactulose (CHRONULAC) 10 GM/15ML solution Take 15 mLs (10 g total) by mouth 3 (three) times daily. 12/04/22   Josph Macho, MD  lidocaine-prilocaine (EMLA) cream Apply 1 Application topically as needed. 12/21/22   Josph Macho, MD  loperamide (IMODIUM A-D) 2 MG tablet Take 2 tabs by mouth with first loose stool, then 1 tablet with each additional loose stool as needed. Do not exceed 8 tablets in a 24-hour period. 12/01/22   Josph Macho, MD  loratadine (CLARITIN) 10 MG tablet Take 10 mg by mouth. Takes day of injection-Udenyca, post chemotherapy.    [provider]  LORazepam (ATIVAN) 0.5 MG tablet Place 1 tablet (0.5 mg total) under the tongue every 6 (six) hours as needed for anxiety. 04/21/23   Josph Macho, MD  methocarbamol (ROBAXIN) 500 MG tablet Take 1 tablet (500 mg total) by mouth every 6 (six) hours as needed for muscle spasms. 02/24/23   Josph Macho, MD  nicotine (NICODERM CQ - DOSED IN MG/24 HOURS) 21 mg/24hr patch Place 1 patch (21 mg total) onto the skin daily. 11/23/22 11/23/23  Kathlen Mody, MD  nystatin (MYCOSTATIN) 100000 UNIT/ML suspension Take 5 mLs (500,000 Units total) by mouth 4 (four) times daily. 12/16/22   Erenest Blank, NP  ondansetron (ZOFRAN) 8 MG tablet Take 1 tablet (8 mg total) by mouth every 8 (eight) hours as needed for nausea or vomiting. Start on the third  day after cisplatin. 02/26/23   Josph Macho, MD  oxyCODONE (OXY IR/ROXICODONE) 5 MG immediate release tablet Take 1 tablet (5 mg total) by mouth every 6 (six) hours as needed for severe pain. 06/09/23   Josph Macho, MD  pantoprazole (PROTONIX) 40 MG tablet Take 1 tablet (40 mg total) by mouth 2 (two) times daily. 01/12/23   Josph Macho, MD  polyethylene glycol (MIRALAX / GLYCOLAX) 17 g packet Take 17 g by mouth daily as needed for mild constipation. 11/23/22   Kathlen Mody, MD  prochlorperazine (COMPAZINE) 10 MG  tablet Take 1 tablet (10 mg total) by mouth every 6 (six) hours as needed for nausea or vomiting. 01/27/23   Josph Macho, MD      Allergies    Patient has no known allergies.    Review of Systems   Review of Systems  Cardiovascular:  Positive for chest pain.    Physical Exam Updated Vital Signs BP 99/62   Pulse 68   Temp 98.2 F (36.8 C)   Resp 18   Ht 5\' 10"  (1.778 m)   Wt 89.8 kg   SpO2 97%   BMI 28.41 kg/m  Physical Exam Vitals reviewed.  Constitutional:      General: He is not in acute distress. HENT:     Head: Normocephalic and atraumatic.  Eyes:     Extraocular Movements: Extraocular movements intact.     Conjunctiva/sclera: Conjunctivae normal.     Pupils: Pupils are equal, round, and reactive to light.  Cardiovascular:     Rate and Rhythm: Normal rate and regular rhythm.     Pulses: Normal pulses.     Heart sounds: Normal heart sounds.     Comments: 2+ bilateral radial/dorsalis pedis pulses with regular rate Pulmonary:     Effort: Pulmonary effort is normal. No respiratory distress.     Breath sounds: Normal breath sounds.  Abdominal:     Palpations: Abdomen is soft.     Tenderness: There is no abdominal tenderness. There is no guarding or rebound.     Comments: Surgical scars noted Patient is unable to say whether or not left upper quadrant palpation causes pain   Musculoskeletal:        General: Normal range of motion.     Cervical back: Normal range of motion and neck supple.     Comments: 5 out of 5 bilateral grip/leg extension strength No bony abnormalities noted to the left ribs when palpated, patient unable to say whether or not palpation to the left ribs cause any pain  Skin:    General: Skin is warm and dry.     Capillary Refill: Capillary refill takes less than 2 seconds.  Neurological:     General: No focal deficit present.     Mental Status: He is alert and oriented to person, place, and time.     Comments: Sensation intact in all 4  limbs  Psychiatric:        Mood and Affect: Mood normal.     ED Results / Procedures / Treatments   Labs (all labs ordered are listed, but only abnormal results are displayed) Labs Reviewed  BASIC METABOLIC PANEL - Abnormal; Notable for the following components:      Result Value   Sodium 134 (*)    Glucose, Bld 166 (*)    BUN 24 (*)    All other components within normal limits  CBC - Abnormal; Notable for the following components:  WBC 11.1 (*)    Platelets 125 (*)    All other components within normal limits  HEPATIC FUNCTION PANEL - Abnormal; Notable for the following components:   AST 44 (*)    ALT 82 (*)    Alkaline Phosphatase 239 (*)    All other components within normal limits  LIPASE, BLOOD  TROPONIN I (HIGH SENSITIVITY)  TROPONIN I (HIGH SENSITIVITY)    EKG None  Radiology CT ABDOMEN PELVIS W CONTRAST  Result Date: 06/22/2023 CLINICAL DATA:  History of metastatic colon cancer. Left rib pain and chest pain. Left upper quadrant pain. EXAM: CT ANGIOGRAPHY CHEST CT ABDOMEN AND PELVIS WITH CONTRAST TECHNIQUE: Multidetector CT imaging of the chest was performed using the standard protocol during bolus administration of intravenous contrast. Multiplanar CT image reconstructions and MIPs were obtained to evaluate the vascular anatomy. Multidetector CT imaging of the abdomen and pelvis was performed using the standard protocol during bolus administration of intravenous contrast. RADIATION DOSE REDUCTION: This exam was performed according to the departmental dose-optimization program which includes automated exposure control, adjustment of the mA and/or kV according to patient size and/or use of iterative reconstruction technique. CONTRAST:  OMNIPAQUE IOHEXOL 350 MG/ML SOLN COMPARISON:  CT chest abdomen and pelvis 05/04/2023. FINDINGS: CTA CHEST FINDINGS Cardiovascular: Satisfactory opacification of the pulmonary arteries to the segmental level. No evidence of pulmonary  embolism. Normal heart size. No pericardial effusion. Right chest port catheter tip ends in the right atrium. Mediastinum/Nodes: No enlarged mediastinal, hilar, or axillary lymph nodes. Thyroid gland, trachea, and esophagus demonstrate no significant findings. Lungs/Pleura: There are new patchy multifocal ground-glass and airspace opacities in the bilateral lower lobes, lingula and minimally in the right middle lobe. There is no pleural effusion or pneumothorax. There secretions in the upper trachea. No discrete pulmonary nodules are seen. Musculoskeletal: No chest wall abnormality. No acute or significant osseous findings. Review of the MIP images confirms the above findings. CT ABDOMEN and PELVIS FINDINGS Hepatobiliary: Mixed attenuation hepatic lesions are again seen compatible with metastatic disease. These have not significantly changed from prior study. Gallbladder and bile ducts are within normal limits. Pancreas: Unremarkable. No pancreatic ductal dilatation or surrounding inflammatory changes. Spleen: Moderately enlarged, unchanged. Adrenals/Urinary Tract: Adrenal glands are unremarkable. Kidneys are normal, without renal calculi, focal lesion, or hydronephrosis. Bladder is unremarkable. Stomach/Bowel: Stomach is within normal limits. Appendix appears normal. No evidence of bowel wall thickening, distention, or inflammatory changes. Rectosigmoid anastomosis is again seen. Vascular/Lymphatic: Aortic atherosclerosis. No enlarged abdominal or pelvic lymph nodes. Reproductive: Prostate gland is mildly enlarged, unchanged. Other: There is a small fat containing umbilical hernia. There is no ascites. Musculoskeletal: No acute or significant osseous findings. Review of the MIP images confirms the above findings. IMPRESSION: 1. No evidence for pulmonary embolism. 2. New patchy multifocal ground-glass and airspace opacities in the bilateral lower lobes, lingula and minimally in the right middle lobe. Findings are  favored as infectious/inflammatory. Follow-up chest CT recommended in 3 months to re-evaluate. 3. Stable hepatic metastatic disease. 4. Stable moderate splenomegaly. Aortic Atherosclerosis (ICD10-I70.0). Electronically Signed   By: Darliss Cheney M.D.   On: 06/22/2023 18:40   CT Angio Chest PE W/Cm &/Or Wo Cm  Result Date: 06/22/2023 CLINICAL DATA:  History of metastatic colon cancer. Left rib pain and chest pain. Left upper quadrant pain. EXAM: CT ANGIOGRAPHY CHEST CT ABDOMEN AND PELVIS WITH CONTRAST TECHNIQUE: Multidetector CT imaging of the chest was performed using the standard protocol during bolus administration of intravenous contrast. Multiplanar CT  image reconstructions and MIPs were obtained to evaluate the vascular anatomy. Multidetector CT imaging of the abdomen and pelvis was performed using the standard protocol during bolus administration of intravenous contrast. RADIATION DOSE REDUCTION: This exam was performed according to the departmental dose-optimization program which includes automated exposure control, adjustment of the mA and/or kV according to patient size and/or use of iterative reconstruction technique. CONTRAST:  OMNIPAQUE IOHEXOL 350 MG/ML SOLN COMPARISON:  CT chest abdomen and pelvis 05/04/2023. FINDINGS: CTA CHEST FINDINGS Cardiovascular: Satisfactory opacification of the pulmonary arteries to the segmental level. No evidence of pulmonary embolism. Normal heart size. No pericardial effusion. Right chest port catheter tip ends in the right atrium. Mediastinum/Nodes: No enlarged mediastinal, hilar, or axillary lymph nodes. Thyroid gland, trachea, and esophagus demonstrate no significant findings. Lungs/Pleura: There are new patchy multifocal ground-glass and airspace opacities in the bilateral lower lobes, lingula and minimally in the right middle lobe. There is no pleural effusion or pneumothorax. There secretions in the upper trachea. No discrete pulmonary nodules are seen.  Musculoskeletal: No chest wall abnormality. No acute or significant osseous findings. Review of the MIP images confirms the above findings. CT ABDOMEN and PELVIS FINDINGS Hepatobiliary: Mixed attenuation hepatic lesions are again seen compatible with metastatic disease. These have not significantly changed from prior study. Gallbladder and bile ducts are within normal limits. Pancreas: Unremarkable. No pancreatic ductal dilatation or surrounding inflammatory changes. Spleen: Moderately enlarged, unchanged. Adrenals/Urinary Tract: Adrenal glands are unremarkable. Kidneys are normal, without renal calculi, focal lesion, or hydronephrosis. Bladder is unremarkable. Stomach/Bowel: Stomach is within normal limits. Appendix appears normal. No evidence of bowel wall thickening, distention, or inflammatory changes. Rectosigmoid anastomosis is again seen. Vascular/Lymphatic: Aortic atherosclerosis. No enlarged abdominal or pelvic lymph nodes. Reproductive: Prostate gland is mildly enlarged, unchanged. Other: There is a small fat containing umbilical hernia. There is no ascites. Musculoskeletal: No acute or significant osseous findings. Review of the MIP images confirms the above findings. IMPRESSION: 1. No evidence for pulmonary embolism. 2. New patchy multifocal ground-glass and airspace opacities in the bilateral lower lobes, lingula and minimally in the right middle lobe. Findings are favored as infectious/inflammatory. Follow-up chest CT recommended in 3 months to re-evaluate. 3. Stable hepatic metastatic disease. 4. Stable moderate splenomegaly. Aortic Atherosclerosis (ICD10-I70.0). Electronically Signed   By: Darliss Cheney M.D.   On: 06/22/2023 18:40   DG Chest 2 View  Result Date: 06/22/2023 CLINICAL DATA:  Chest pain. Upper rib pain for 3 days on the left side. History of metastatic colon cancer. EXAM: CHEST - 2 VIEW COMPARISON:  X-ray 12/17/2022.  CT 05/04/2023 FINDINGS: Right upper chest port identified with  tip at the SVC right atrial junction. Underinflation with some basilar linear changes, favor atelectasis. No pneumothorax, effusion or edema. No consolidation. Normal cardiopericardial silhouette. There is a air-fluid level along the stomach beneath the diaphragm as well as some air-filled distended loops of colon. Please correlate with any symptoms. Degenerative changes of the spine on lateral view. IMPRESSION: Underinflation with presumed basilar atelectasis. Chest port. Air-filled loops of bowel in the visualized portions of the abdomen. Please correlate with any symptoms with patient's history of colon cancer. Electronically Signed   By: Karen Kays M.D.   On: 06/22/2023 14:08    Procedures Procedures    Medications Ordered in ED Medications  amoxicillin-clavulanate (AUGMENTIN) 875-125 MG per tablet 1 tablet (has no administration in time range)  azithromycin (ZITHROMAX) tablet 500 mg (has no administration in time range)  HYDROmorphone (DILAUDID) injection 1 mg (  1 mg Intravenous Given 06/22/23 1417)  iohexol (OMNIPAQUE) 350 MG/ML injection 100 mL (100 mLs Intravenous Contrast Given 06/22/23 1546)    ED Course/ Medical Decision Making/ A&P                                 Medical Decision Making Amount and/or Complexity of Data Reviewed Labs: ordered. Radiology: ordered.  Risk Prescription drug management.   Jubril Stout Tinkle 47 y.o. presented today for left upper quadrant/left rib pain. Working DDx that I considered at this time includes, but not limited to, ostealgia, PNA, ACS, PE, progression of cancer, pancreatitis, splenomegaly, gastritis, PUD.  R/o DDx: ACS, PE, progression of cancer, pancreatitis, splenomegaly, gastritis, PUD: These are considered less likely due to history of present illness, physical exam, labs/imaging findings  Review of prior external notes: 06/15/2023 ED  Unique Tests and My Interpretation:  CBC: Mild leukocytosis 11.1 however down from  yesterday BMP: Unremarkable Hepatic function panel: Elevated transaminitis with alk phos however this is down from previous Lipase: Unremarkable Troponin: 2, 3 Chest x-ray: Air-filled loops of bowel CT angio chest: Bilateral opacities in the lingula, right middle lobe, bilateral lower lobes, no other acute findings CT abdomen pelvis contrast: No other acute findings EKG: 68 bpm, sinus, no ST elevations or depressions noted, no blocks noted  Social Determinants of Health: none  Discussion with Independent Historian: None  Discussion of Management of Tests: None  Risk: Medium: prescription drug management  Risk Stratification Score: none  Staffed with Adela Lank, DO  Plan: On exam patient was no acute distress with stable vitals.  Patient states that the pain he is having today feels different than the pain he had when he was here a week ago when the pain was in his back.  Patient's history did not endorse any red flags however with his left upper quadrant pain there is concern that it could be a cardiopulmonary component, MSK, abdominal as patient does have history of cancer in the area as well.  After speaking with the attending we agreed to CT chest abdomen pelvis to rule out pneumonia, PE, worsening of mets to the area, other pathology.  It is unclear whether or not this is the same pain patient had when he was here previously when he had bone pain from his infusions.  Patient hepatic function panel came back with mild transaminitis and alk phos however this is chronic and actually down from previous results.  Currently awaiting on CT scan.  On recheck patient stated that he was resting comfortably and that although he was having pain was manageable and does not require pain meds at this time.  Still waiting on CT reports.  CT does show bilateral pneumonia in the lower lobes that could be contributing to patient's symptoms.  Patient's been here for nearly 8 hours and is remained relatively  controlled with this pain with that 1 dose of pain meds at the very beginning.  Patient states he feels comfortable being discharged.  Will give 1 dose of antibiotics here and prescribe the rest.  Encourage patient follow-up with oncologist along with primary care provider and to return to ER symptoms are change or worsen.  I spoke to the patient to having a repeat CT scan done in 3 months to reevaluate and patient verbalized understanding.  Patient was given return precautions. Patient stable for discharge at this time.  Patient verbalized understanding of plan.  This chart  was dictated using voice recognition software.  Despite best efforts to proofread,  errors can occur which can change the documentation meaning.         Final Clinical Impression(s) / ED Diagnoses Final diagnoses:  Pneumonia of both lungs due to infectious organism, unspecified part of lung    Rx / DC Orders ED Discharge Orders          Ordered    amoxicillin-clavulanate (AUGMENTIN) 875-125 MG tablet  Every 12 hours        06/22/23 1859    azithromycin (ZITHROMAX) 250 MG tablet  Daily        06/22/23 1859              Netta Corrigan, PA-C 06/22/23 1901    Melene Plan, DO 06/23/23 [phone]

## 2023-06-23 ENCOUNTER — Other Ambulatory Visit: Payer: Self-pay

## 2023-06-23 ENCOUNTER — Encounter: Payer: Self-pay | Admitting: *Deleted

## 2023-06-23 ENCOUNTER — Inpatient Hospital Stay: Payer: Medicaid Other

## 2023-06-23 ENCOUNTER — Other Ambulatory Visit (HOSPITAL_BASED_OUTPATIENT_CLINIC_OR_DEPARTMENT_OTHER): Payer: Self-pay

## 2023-06-23 ENCOUNTER — Inpatient Hospital Stay: Payer: Medicaid Other | Admitting: Medical Oncology

## 2023-06-23 ENCOUNTER — Other Ambulatory Visit: Payer: Self-pay | Admitting: *Deleted

## 2023-06-23 ENCOUNTER — Telehealth: Payer: Self-pay | Admitting: *Deleted

## 2023-06-23 DIAGNOSIS — C189 Malignant neoplasm of colon, unspecified: Secondary | ICD-10-CM

## 2023-06-23 MED ORDER — OXYCODONE HCL 5 MG PO TABS
5.0000 mg | ORAL_TABLET | Freq: Four times a day (QID) | ORAL | 0 refills | Status: DC | PRN
  Filled 2023-06-23: qty 60, 15d supply, fill #0

## 2023-06-23 NOTE — Progress Notes (Signed)
Patient was scheduled to come in today for his next cycle, however he was seen in the ED yesterday and diagnosed with bilateral pneumonia. His treatment will be pushed back two weeks.   Oncology Nurse Navigator Documentation     06/23/2023    9:00 AM  Oncology Nurse Navigator Flowsheets  Navigator Follow Up Date: 07/07/2023  Navigator Follow Up Reason: Follow-up Appointment;Chemotherapy  Navigator Location [facility]  Navigator Encounter Type Appt/Treatment Plan Review  Patient Visit Type MedOnc  Treatment Phase Active Tx  Barriers/Navigation Needs Coordination of Care  Interventions None Required  Acuity Level 1-No Barriers  Time Spent with Patient 15

## 2023-06-23 NOTE — Telephone Encounter (Signed)
Call received from patient stating that he was in the ER yesterday, 06/22/23 and was diagnosed with pneumonia.  Pt instructed to not come in today for treatment and to be sure that he starts and finishes both antibiotics that were sent in for him by the ER.  Pt states that he will pick up antibiotics this morning and start them today.  Fortino Sic PA notified of pt.'s pneumonia and would like for pt to hold on treatment today and to reschedule in two weeks.  Message sent to scheduling.

## 2023-06-24 ENCOUNTER — Other Ambulatory Visit: Payer: Self-pay

## 2023-06-25 ENCOUNTER — Inpatient Hospital Stay: Payer: Medicaid Other

## 2023-06-30 ENCOUNTER — Inpatient Hospital Stay: Payer: Medicaid Other

## 2023-06-30 ENCOUNTER — Ambulatory Visit: Payer: Medicaid Other

## 2023-06-30 ENCOUNTER — Ambulatory Visit: Payer: Medicaid Other | Admitting: Hematology & Oncology

## 2023-06-30 ENCOUNTER — Other Ambulatory Visit: Payer: Medicaid Other

## 2023-07-06 ENCOUNTER — Encounter: Payer: Self-pay | Admitting: Gastroenterology

## 2023-07-07 ENCOUNTER — Inpatient Hospital Stay: Payer: Medicaid Other

## 2023-07-07 ENCOUNTER — Encounter: Payer: Self-pay | Admitting: Hematology & Oncology

## 2023-07-07 ENCOUNTER — Inpatient Hospital Stay: Payer: Medicaid Other | Admitting: Medical Oncology

## 2023-07-08 ENCOUNTER — Other Ambulatory Visit: Payer: Self-pay

## 2023-07-09 ENCOUNTER — Ambulatory Visit: Payer: Medicaid Other

## 2023-07-09 DIAGNOSIS — C189 Malignant neoplasm of colon, unspecified: Secondary | ICD-10-CM

## 2023-07-12 ENCOUNTER — Other Ambulatory Visit: Payer: Self-pay | Admitting: *Deleted

## 2023-07-12 ENCOUNTER — Inpatient Hospital Stay (HOSPITAL_BASED_OUTPATIENT_CLINIC_OR_DEPARTMENT_OTHER): Payer: Medicaid Other | Admitting: Medical Oncology

## 2023-07-12 ENCOUNTER — Inpatient Hospital Stay: Payer: Medicaid Other

## 2023-07-12 ENCOUNTER — Encounter: Payer: Self-pay | Admitting: Medical Oncology

## 2023-07-12 ENCOUNTER — Other Ambulatory Visit: Payer: Self-pay

## 2023-07-12 ENCOUNTER — Other Ambulatory Visit (HOSPITAL_BASED_OUTPATIENT_CLINIC_OR_DEPARTMENT_OTHER): Payer: Self-pay

## 2023-07-12 ENCOUNTER — Inpatient Hospital Stay: Payer: Medicaid Other | Attending: Hematology & Oncology

## 2023-07-12 VITALS — BP 120/62 | HR 63 | Temp 97.5°F | Resp 17 | Ht 70.0 in | Wt 207.0 lb

## 2023-07-12 DIAGNOSIS — C187 Malignant neoplasm of sigmoid colon: Secondary | ICD-10-CM | POA: Diagnosis present

## 2023-07-12 DIAGNOSIS — C189 Malignant neoplasm of colon, unspecified: Secondary | ICD-10-CM

## 2023-07-12 DIAGNOSIS — R7309 Other abnormal glucose: Secondary | ICD-10-CM

## 2023-07-12 DIAGNOSIS — G629 Polyneuropathy, unspecified: Secondary | ICD-10-CM

## 2023-07-12 DIAGNOSIS — R110 Nausea: Secondary | ICD-10-CM | POA: Diagnosis not present

## 2023-07-12 DIAGNOSIS — C787 Secondary malignant neoplasm of liver and intrahepatic bile duct: Secondary | ICD-10-CM | POA: Insufficient documentation

## 2023-07-12 DIAGNOSIS — M62838 Other muscle spasm: Secondary | ICD-10-CM

## 2023-07-12 DIAGNOSIS — Z5189 Encounter for other specified aftercare: Secondary | ICD-10-CM | POA: Diagnosis not present

## 2023-07-12 DIAGNOSIS — K6389 Other specified diseases of intestine: Secondary | ICD-10-CM

## 2023-07-12 DIAGNOSIS — R52 Pain, unspecified: Secondary | ICD-10-CM

## 2023-07-12 DIAGNOSIS — F17200 Nicotine dependence, unspecified, uncomplicated: Secondary | ICD-10-CM

## 2023-07-12 DIAGNOSIS — E875 Hyperkalemia: Secondary | ICD-10-CM

## 2023-07-12 DIAGNOSIS — Z79899 Other long term (current) drug therapy: Secondary | ICD-10-CM | POA: Diagnosis not present

## 2023-07-12 DIAGNOSIS — Z5111 Encounter for antineoplastic chemotherapy: Secondary | ICD-10-CM | POA: Diagnosis present

## 2023-07-12 DIAGNOSIS — K625 Hemorrhage of anus and rectum: Secondary | ICD-10-CM

## 2023-07-12 DIAGNOSIS — R569 Unspecified convulsions: Secondary | ICD-10-CM

## 2023-07-12 DIAGNOSIS — Z1379 Encounter for other screening for genetic and chromosomal anomalies: Secondary | ICD-10-CM

## 2023-07-12 DIAGNOSIS — R1084 Generalized abdominal pain: Secondary | ICD-10-CM

## 2023-07-12 DIAGNOSIS — R935 Abnormal findings on diagnostic imaging of other abdominal regions, including retroperitoneum: Secondary | ICD-10-CM

## 2023-07-12 DIAGNOSIS — R7401 Elevation of levels of liver transaminase levels: Secondary | ICD-10-CM

## 2023-07-12 DIAGNOSIS — M79602 Pain in left arm: Secondary | ICD-10-CM

## 2023-07-12 LAB — CBC WITH DIFFERENTIAL (CANCER CENTER ONLY)
Abs Immature Granulocytes: 0.01 10*3/uL (ref 0.00–0.07)
Basophils Absolute: 0 10*3/uL (ref 0.0–0.1)
Basophils Relative: 0 %
Eosinophils Absolute: 0.3 10*3/uL (ref 0.0–0.5)
Eosinophils Relative: 6 %
HCT: 42.2 % (ref 39.0–52.0)
Hemoglobin: 14.3 g/dL (ref 13.0–17.0)
Immature Granulocytes: 0 %
Lymphocytes Relative: 28 %
Lymphs Abs: 1.4 10*3/uL (ref 0.7–4.0)
MCH: 31.9 pg (ref 26.0–34.0)
MCHC: 33.9 g/dL (ref 30.0–36.0)
MCV: 94.2 fL (ref 80.0–100.0)
Monocytes Absolute: 0.4 10*3/uL (ref 0.1–1.0)
Monocytes Relative: 8 %
Neutro Abs: 2.7 10*3/uL (ref 1.7–7.7)
Neutrophils Relative %: 58 %
Platelet Count: 145 10*3/uL — ABNORMAL LOW (ref 150–400)
RBC: 4.48 MIL/uL (ref 4.22–5.81)
RDW: 13 % (ref 11.5–15.5)
WBC Count: 4.8 10*3/uL (ref 4.0–10.5)
nRBC: 0 % (ref 0.0–0.2)

## 2023-07-12 LAB — CMP (CANCER CENTER ONLY)
ALT: 35 U/L (ref 0–44)
AST: 37 U/L (ref 15–41)
Albumin: 4.3 g/dL (ref 3.5–5.0)
Alkaline Phosphatase: 171 U/L — ABNORMAL HIGH (ref 38–126)
Anion gap: 8 (ref 5–15)
BUN: 17 mg/dL (ref 6–20)
CO2: 28 mmol/L (ref 22–32)
Calcium: 9.8 mg/dL (ref 8.9–10.3)
Chloride: 106 mmol/L (ref 98–111)
Creatinine: 1.14 mg/dL (ref 0.61–1.24)
GFR, Estimated: >60 mL/min (ref >60)
Glucose, Bld: 183 mg/dL — ABNORMAL HIGH (ref 70–99)
Potassium: 4.1 mmol/L (ref 3.5–5.1)
Sodium: 142 mmol/L (ref 135–145)
Total Bilirubin: 0.7 mg/dL (ref <1.2)
Total Protein: 6.9 g/dL (ref 6.5–8.1)

## 2023-07-12 MED ORDER — HEPARIN SOD (PORK) LOCK FLUSH 100 UNIT/ML IV SOLN
500.0000 [IU] | Freq: Once | INTRAVENOUS | Status: DC | PRN
Start: 2023-07-12 — End: 2023-07-12

## 2023-07-12 MED ORDER — PALONOSETRON HCL INJECTION 0.25 MG/5ML
0.2500 mg | Freq: Once | INTRAVENOUS | Status: AC
  Administered 2023-07-12: 0.25 mg via INTRAVENOUS
  Filled 2023-07-12: qty 5

## 2023-07-12 MED ORDER — GABAPENTIN 300 MG PO CAPS
300.0000 mg | ORAL_CAPSULE | Freq: Three times a day (TID) | ORAL | 2 refills | Status: DC
  Filled 2023-07-12: qty 90, 30d supply, fill #0

## 2023-07-12 MED ORDER — SODIUM CHLORIDE 0.9 % IV SOLN
400.0000 mg/m2 | Freq: Once | INTRAVENOUS | Status: AC
  Administered 2023-07-12: 824 mg via INTRAVENOUS
  Filled 2023-07-12: qty 41.2

## 2023-07-12 MED ORDER — OXYCODONE HCL 5 MG PO TABS
5.0000 mg | ORAL_TABLET | Freq: Four times a day (QID) | ORAL | 0 refills | Status: DC | PRN
  Filled 2023-07-12: qty 60, 15d supply, fill #0

## 2023-07-12 MED ORDER — ATROPINE SULFATE 1 MG/ML IV SOLN
0.5000 mg | Freq: Once | INTRAVENOUS | Status: AC | PRN
  Administered 2023-07-12: 0.5 mg via INTRAVENOUS
  Filled 2023-07-12: qty 1

## 2023-07-12 MED ORDER — SODIUM CHLORIDE 0.9% FLUSH: 10.0000 mL | INTRAVENOUS | Status: DC | PRN

## 2023-07-12 MED ORDER — SODIUM CHLORIDE 0.9 % IV SOLN: Freq: Once | INTRAVENOUS | Status: AC

## 2023-07-12 MED ORDER — DEXAMETHASONE SODIUM PHOSPHATE 10 MG/ML IJ SOLN
10.0000 mg | Freq: Once | INTRAMUSCULAR | Status: AC
  Administered 2023-07-12: 10 mg via INTRAVENOUS
  Filled 2023-07-12: qty 1

## 2023-07-12 MED ORDER — SODIUM CHLORIDE 0.9 % IV SOLN
2400.0000 mg/m2 | INTRAVENOUS | Status: DC
  Administered 2023-07-12: 5000 mg via INTRAVENOUS
  Filled 2023-07-12: qty 100

## 2023-07-12 MED ORDER — SODIUM CHLORIDE 0.9 % IV SOLN
132.0000 mg/m2 | Freq: Once | INTRAVENOUS | Status: AC
  Administered 2023-07-12: 300 mg via INTRAVENOUS
  Filled 2023-07-12: qty 15

## 2023-07-12 MED ORDER — PROCHLORPERAZINE MALEATE 10 MG PO TABS
10.0000 mg | ORAL_TABLET | Freq: Four times a day (QID) | ORAL | 3 refills | Status: DC | PRN
  Filled 2023-07-12: qty 30, 8d supply, fill #0

## 2023-07-12 MED ORDER — SODIUM CHLORIDE 0.9 % IV SOLN
150.0000 mg | Freq: Once | INTRAVENOUS | Status: AC
  Administered 2023-07-12: 150 mg via INTRAVENOUS
  Filled 2023-07-12: qty 150

## 2023-07-12 NOTE — Patient Instructions (Signed)
[facility] - A DEPT OF [facility]  Discharge Instructions: Thank you for choosing [facility] to provide your oncology and hematology care.   If you have a lab appointment with the Cancer Center, please go directly to the Cancer Center and check in at the registration area.  Wear comfortable clothing and clothing appropriate for easy access to any Portacath or PICC line.   We strive to give you quality time with your provider. You may need to reschedule your appointment if you arrive late (15 or more minutes).  Arriving late affects you and other patients whose appointments are after yours.  Also, if you miss three or more appointments without notifying the office, you may be dismissed from the clinic at the provider's discretion.      For prescription refill requests, have your pharmacy contact our office and allow 72 hours for refills to be completed.    Today you received the following chemotherapy and/or immunotherapy agents FOLFIRI      To help prevent nausea and vomiting after your treatment, we encourage you to take your nausea medication as directed.  BELOW ARE SYMPTOMS THAT SHOULD BE REPORTED IMMEDIATELY: *FEVER GREATER THAN 100.4 F (38 C) OR HIGHER *CHILLS OR SWEATING *NAUSEA AND VOMITING THAT IS NOT CONTROLLED WITH YOUR NAUSEA MEDICATION *UNUSUAL SHORTNESS OF BREATH *UNUSUAL BRUISING OR BLEEDING *URINARY PROBLEMS (pain or burning when urinating, or frequent urination) *BOWEL PROBLEMS (unusual diarrhea, constipation, pain near the anus) TENDERNESS IN MOUTH AND THROAT WITH OR WITHOUT PRESENCE OF ULCERS (sore throat, sores in mouth, or a toothache) UNUSUAL RASH, SWELLING OR PAIN  UNUSUAL VAGINAL DISCHARGE OR ITCHING   Items with * indicate a potential emergency and should be followed up as soon as possible or go to the Emergency Department if any problems should occur.  Please show the CHEMOTHERAPY ALERT CARD or IMMUNOTHERAPY  ALERT CARD at check-in to the Emergency Department and triage nurse. Should you have questions after your visit or need to cancel or reschedule your appointment, please contact [facility] - A DEPT OF [facility]  [phone] and follow the prompts.  Office hours are 8:00 a.m. to 4:30 p.m. Monday - Friday. Please note that voicemails left after 4:00 p.m. may not be returned until the following business day.  We are closed weekends and major holidays. You have access to a nurse at all times for urgent questions. Please call the main number to the clinic [phone] and follow the prompts.  For any non-urgent questions, you may also contact your provider using MyChart. We now offer e-Visits for anyone 9 and older to request care online for non-urgent symptoms. For details visit [email or web address].   Also download the MyChart app! Go to the app store, search "MyChart", open the app, select [facility], and log in with your MyChart username and password.

## 2023-07-12 NOTE — Patient Instructions (Signed)

## 2023-07-12 NOTE — Progress Notes (Signed)
Hematology and Oncology Follow Up Visit  Richard Bowman [id number] September 18, 1975 47 y.o. 07/12/2023  Principle Diagnosis:  Metastatic adenocarcinoma of the colon-liver metastasis -- pMMR/MSI-low -- (+) KRAS/ ERBB2(+)       Iron deficiency anemia Current Therapy:   FOLFOXIRI - s/p cycle #9  - start on  -- 12/09/2022 --oxaliplatin dropped after 04/19/2023 IV iron as indicated     Interim History:  Richard Bowman is back for follow-up and consideration of treatment today  Today he reports that he is doing well. He has noticed worsening numbness and tingling of hands and feet. He has noticed that he is tripping over his own feet and has dropped his phone a few times. His glucose today is 183.   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 He has been tolerating treatment well now.  He is eating well.  He is having no problems with nausea or vomiting.  He is having no cough or shortness of breath.  There is been no bleeding.  He has had no mouth sores.  He has had no leg swelling.  He has had no headache.  He takes the pain medication 4 times per day for his chronic cancer related abdominal pain.   His last iron studies that were done back in Oct showed a ferritin of 183 with an iron saturation of 28%.  Currently, I would say that his performance status is probably ECOG 1.   Wt Readings from Last 3 Encounters:  07/12/23 207 lb (93.9 kg)  06/22/23 197 lb 15.6 oz (89.8 kg)  06/15/23 197 lb 15.6 oz (89.8 kg)    Medications:  Current Outpatient Medications:    acetaminophen (TYLENOL) 500 MG tablet, Take 2 tablets (1,000 mg total) by mouth every 6 (six) hours as needed for mild pain., Disp: 30 tablet, Rfl: 0   dexamethasone (DECADRON) 4 MG tablet, Take 2 tablets (8 mg total) by mouth daily for 3 days, starting the day after chemotherapy. Take with food., Disp: 30 tablet, Rfl: 1   docusate sodium (COLACE) 100 MG capsule, Take 1 capsule (100 mg total)  by mouth 2 (two) times daily., Disp: 10 capsule, Rfl: 0   lactose free nutrition (BOOST) LIQD, Take 237 mLs by mouth daily as needed (Supplement)., Disp: , Rfl:    lactulose (CHRONULAC) 10 GM/15ML solution, Take 15 mLs (10 g total) by mouth 3 (three) times daily., Disp: 236 mL, Rfl: 0   lidocaine-prilocaine (EMLA) cream, Apply 1 Application topically as needed., Disp: 30 g, Rfl: 0   loperamide (IMODIUM A-D) 2 MG tablet, Take 2 tabs by mouth with first loose stool, then 1 tablet with each additional loose stool as needed. Do not exceed 8 tablets in a 24-hour period., Disp: 100 tablet, Rfl: 3   loratadine (CLARITIN) 10 MG tablet, Take 10 mg by mouth. Takes day of injection-Udenyca, post chemotherapy., Disp: , Rfl:    LORazepam (ATIVAN) 0.5 MG tablet, Place 1 tablet (0.5 mg total) under the tongue every 6 (six) hours as needed for anxiety., Disp: 60 tablet, Rfl: 0   methocarbamol (ROBAXIN) 500 MG tablet, Take 1 tablet (500 mg total) by mouth every 6 (six) hours as needed for muscle spasms., Disp: 30 tablet, Rfl: 0   ondansetron (ZOFRAN) 8 MG tablet, Take 1 tablet (8 mg total) by mouth every 8 (eight) hours as needed for nausea or vomiting. Start on the third day after cisplatin., Disp: 30 tablet, Rfl: 2  oxyCODONE (OXY IR/ROXICODONE) 5 MG immediate release tablet, Take 1 tablet (5 mg total) by mouth every 6 (six) hours as needed for severe pain (pain score 7-10)., Disp: 60 tablet, Rfl: 0   pantoprazole (PROTONIX) 40 MG tablet, Take 1 tablet (40 mg total) by mouth 2 (two) times daily., Disp: 60 tablet, Rfl: 6   polyethylene glycol (MIRALAX / GLYCOLAX) 17 g packet, Take 17 g by mouth daily as needed for mild constipation., Disp: 14 each, Rfl: 0   nicotine (NICODERM CQ - DOSED IN MG/24 HOURS) 21 mg/24hr patch, Place 1 patch (21 mg total) onto the skin daily. (Patient not taking: Reported on 07/12/2023), Disp: 30 patch, Rfl: 1   nystatin (MYCOSTATIN) 100000 UNIT/ML suspension, Take 5 mLs (500,000 Units total)  by mouth 4 (four) times daily. (Patient not taking: Reported on 07/12/2023), Disp: 473 mL, Rfl: 1   prochlorperazine (COMPAZINE) 10 MG tablet, Take 1 tablet (10 mg total) by mouth every 6 (six) hours as needed for nausea or vomiting., Disp: 30 tablet, Rfl: 3  Allergies: No Known Allergies  Past Medical History, Surgical history, Social history, and Family History were reviewed and updated.  Review of Systems: Review of Systems  Constitutional: Negative.   HENT:  Negative.  Negative for hearing loss.   Eyes: Negative.  Negative for eye problems.  Respiratory: Negative.    Cardiovascular: Negative.   Gastrointestinal:  Positive for abdominal pain. Negative for constipation.  Endocrine: Negative.   Genitourinary: Negative.    Musculoskeletal: Negative.   Skin: Negative.   Neurological: Negative.        Numbness and tingling of hands and feet  Hematological: Negative.   Psychiatric/Behavioral: Negative.      Physical Exam: Vitals:   07/12/23 0953  BP: 120/62  Pulse: 63  Resp: 17  Temp: (!) 97.5 F (36.4 C)  SpO2: 96%   Wt Readings from Last 3 Encounters:  07/12/23 207 lb (93.9 kg)  06/22/23 197 lb 15.6 oz (89.8 kg)  06/15/23 197 lb 15.6 oz (89.8 kg)    Physical Exam Vitals reviewed.  HENT:     Head: Normocephalic and atraumatic.  Eyes:     Pupils: Pupils are equal, round, and reactive to light.  Cardiovascular:     Rate and Rhythm: Normal rate and regular rhythm.     Heart sounds: Normal heart sounds.  Pulmonary:     Effort: Pulmonary effort is normal.     Breath sounds: Normal breath sounds.  Abdominal:     General: Bowel sounds are normal.     Palpations: Abdomen is soft.     Comments: Abdominal exam shows a well-healed laparotomy scar.  He has no fluid wave.  There is no guarding or rebound tenderness.    Musculoskeletal:        General: No tenderness or deformity. Normal range of motion.     Cervical back: Normal range of motion.  Lymphadenopathy:      Cervical: No cervical adenopathy.  Skin:    General: Skin is warm and dry.     Findings: No erythema or rash.  Neurological:     Mental Status: He is alert and oriented to person, place, and time.  Psychiatric:        Behavior: Behavior normal.        Thought Content: Thought content normal.        Judgment: Judgment normal.      Lab Results  Component Value Date   WBC 4.8 07/12/2023   HGB  14.3 07/12/2023   HCT 42.2 07/12/2023   MCV 94.2 07/12/2023   PLT 145 (L) 07/12/2023     Chemistry      Component Value Date/Time   NA 142 07/12/2023 0935   K 4.1 07/12/2023 0935   CL 106 07/12/2023 0935   CO2 28 07/12/2023 0935   BUN 17 07/12/2023 0935   CREATININE 1.14 07/12/2023 0935      Component Value Date/Time   CALCIUM 9.8 07/12/2023 0935   ALKPHOS 171 (H) 07/12/2023 0935   AST 37 07/12/2023 0935   ALT 35 07/12/2023 0935   BILITOT 0.7 07/12/2023 0935     No diagnosis found.  Impression and Plan: Mr. Puerto is a very nice 47 year old white male.  He was found to have metastatic colon cancer. Today he is here for consideration of treatment number 10/12. After this the plan is for him to transition onto xeloda/avastin.   Labs reviewed and are acceptable for treatment today Discussed his neuropathy with Dr. Myna Hidalgo. We will start him on Gabapentin and treat today. If symptoms worsen after today we will switch him off of current treatment onto [facility]. Reviewed this information with patient. Reviewed how to take his gabapentin along with common side effects and precautions.  CEA/Iron studies pending/B12 B12 to be added onto his labs   Disposition Treatment 11 today FOLFOXIRI RTC Day 3 pump removal RTC 2 weeks MD only , labs, Cycle 12 FOLFOXIRI-[place]   Brand Males [place], PA-C 11/11/202410:16 AM

## 2023-07-13 ENCOUNTER — Encounter: Payer: Self-pay | Admitting: *Deleted

## 2023-07-13 NOTE — Progress Notes (Signed)
Patient received cycle 11. He has one more planned cycle and then will likely transition to [place].   Oncology Nurse Navigator Documentation     07/13/2023    8:45 AM  Oncology Nurse Navigator Flowsheets  Navigator Follow Up Date: 07/26/2023  Navigator Follow Up Reason: Follow-up Appointment;Chemotherapy  Navigator Location [facility]  Navigator Encounter Type Appt/Treatment Plan Review  Patient Visit Type MedOnc  Treatment Phase Active Tx  Barriers/Navigation Needs Coordination of Care  Interventions None Required  Acuity Level 1-No Barriers  Time Spent with Patient 15

## 2023-07-14 ENCOUNTER — Inpatient Hospital Stay: Payer: Medicaid Other

## 2023-07-14 VITALS — BP 102/62 | HR 60 | Temp 98.0°F | Resp 20

## 2023-07-14 DIAGNOSIS — C189 Malignant neoplasm of colon, unspecified: Secondary | ICD-10-CM

## 2023-07-14 DIAGNOSIS — Z5111 Encounter for antineoplastic chemotherapy: Secondary | ICD-10-CM | POA: Diagnosis not present

## 2023-07-14 DIAGNOSIS — R1084 Generalized abdominal pain: Secondary | ICD-10-CM

## 2023-07-14 MED ORDER — SODIUM CHLORIDE 0.9% FLUSH
10.0000 mL | INTRAVENOUS | Status: DC | PRN
Start: 2023-07-14 — End: 2023-07-14
  Administered 2023-07-14: 10 mL

## 2023-07-14 MED ORDER — HEPARIN SOD (PORK) LOCK FLUSH 100 UNIT/ML IV SOLN
500.0000 [IU] | Freq: Once | INTRAVENOUS | Status: AC | PRN
  Administered 2023-07-14: 500 [IU]

## 2023-07-14 MED ORDER — SODIUM CHLORIDE 0.9 % IV SOLN: 10.0000 mg | Freq: Once | INTRAVENOUS | Status: DC

## 2023-07-14 MED ORDER — ONDANSETRON HCL 4 MG/2ML IJ SOLN
8.0000 mg | Freq: Once | INTRAMUSCULAR | Status: DC
Start: 2023-07-14 — End: 2023-07-14

## 2023-07-14 MED ORDER — PROCHLORPERAZINE EDISYLATE 10 MG/2ML IJ SOLN
10.0000 mg | Freq: Once | INTRAMUSCULAR | Status: DC
Start: 2023-07-14 — End: 2023-07-14

## 2023-07-14 MED ORDER — PEGFILGRASTIM-JMDB 6 MG/0.6ML ~~LOC~~ SOSY
6.0000 mg | PREFILLED_SYRINGE | Freq: Once | SUBCUTANEOUS | Status: AC
  Administered 2023-07-14: 6 mg via SUBCUTANEOUS
  Filled 2023-07-14: qty 0.6

## 2023-07-14 MED ORDER — SODIUM CHLORIDE 0.9 % IV SOLN: Freq: Once | INTRAVENOUS | Status: AC

## 2023-07-14 MED ORDER — DEXAMETHASONE SODIUM PHOSPHATE 10 MG/ML IJ SOLN
10.0000 mg | Freq: Once | INTRAMUSCULAR | Status: AC
Start: 2023-07-14 — End: 2023-07-14
  Administered 2023-07-14: 10 mg via INTRAVENOUS
  Filled 2023-07-14: qty 1

## 2023-07-14 NOTE — Progress Notes (Signed)
Patient here for pump d/c and injection.  He is obviously not feeling well, saying he hasn't slept and is very tired.  He is also nauseated but not vomiting.  Spoke with Dr. Myna Hidalgo and will give nausea meds and fluid before discharge.

## 2023-07-23 ENCOUNTER — Other Ambulatory Visit: Payer: Self-pay | Admitting: *Deleted

## 2023-07-23 DIAGNOSIS — C189 Malignant neoplasm of colon, unspecified: Secondary | ICD-10-CM

## 2023-07-26 ENCOUNTER — Inpatient Hospital Stay: Payer: Medicaid Other

## 2023-07-26 ENCOUNTER — Inpatient Hospital Stay: Payer: Medicaid Other | Admitting: Hematology & Oncology

## 2023-07-26 ENCOUNTER — Encounter: Payer: Self-pay | Admitting: Hematology & Oncology

## 2023-07-26 ENCOUNTER — Other Ambulatory Visit: Payer: Self-pay

## 2023-07-26 ENCOUNTER — Inpatient Hospital Stay (HOSPITAL_BASED_OUTPATIENT_CLINIC_OR_DEPARTMENT_OTHER): Payer: Medicaid Other | Admitting: Hematology & Oncology

## 2023-07-26 ENCOUNTER — Telehealth (HOSPITAL_BASED_OUTPATIENT_CLINIC_OR_DEPARTMENT_OTHER): Payer: Self-pay

## 2023-07-26 VITALS — BP 112/58 | HR 68 | Temp 98.0°F | Resp 17 | Ht 70.0 in | Wt 212.0 lb

## 2023-07-26 VITALS — HR 50 | Resp 17

## 2023-07-26 DIAGNOSIS — E875 Hyperkalemia: Secondary | ICD-10-CM

## 2023-07-26 DIAGNOSIS — M79602 Pain in left arm: Secondary | ICD-10-CM

## 2023-07-26 DIAGNOSIS — C189 Malignant neoplasm of colon, unspecified: Secondary | ICD-10-CM

## 2023-07-26 DIAGNOSIS — K625 Hemorrhage of anus and rectum: Secondary | ICD-10-CM

## 2023-07-26 DIAGNOSIS — R569 Unspecified convulsions: Secondary | ICD-10-CM

## 2023-07-26 DIAGNOSIS — M62838 Other muscle spasm: Secondary | ICD-10-CM

## 2023-07-26 DIAGNOSIS — Z5111 Encounter for antineoplastic chemotherapy: Secondary | ICD-10-CM | POA: Diagnosis not present

## 2023-07-26 DIAGNOSIS — R7401 Elevation of levels of liver transaminase levels: Secondary | ICD-10-CM

## 2023-07-26 DIAGNOSIS — Z1379 Encounter for other screening for genetic and chromosomal anomalies: Secondary | ICD-10-CM

## 2023-07-26 DIAGNOSIS — R52 Pain, unspecified: Secondary | ICD-10-CM

## 2023-07-26 DIAGNOSIS — R935 Abnormal findings on diagnostic imaging of other abdominal regions, including retroperitoneum: Secondary | ICD-10-CM

## 2023-07-26 DIAGNOSIS — C787 Secondary malignant neoplasm of liver and intrahepatic bile duct: Secondary | ICD-10-CM | POA: Diagnosis not present

## 2023-07-26 DIAGNOSIS — G629 Polyneuropathy, unspecified: Secondary | ICD-10-CM

## 2023-07-26 DIAGNOSIS — F17200 Nicotine dependence, unspecified, uncomplicated: Secondary | ICD-10-CM

## 2023-07-26 DIAGNOSIS — R1084 Generalized abdominal pain: Secondary | ICD-10-CM

## 2023-07-26 DIAGNOSIS — R7309 Other abnormal glucose: Secondary | ICD-10-CM

## 2023-07-26 DIAGNOSIS — K6389 Other specified diseases of intestine: Secondary | ICD-10-CM

## 2023-07-26 LAB — CBC WITH DIFFERENTIAL (CANCER CENTER ONLY)
Abs Immature Granulocytes: 0.05 10*3/uL (ref 0.00–0.07)
Basophils Absolute: 0 10*3/uL (ref 0.0–0.1)
Basophils Relative: 0 %
Eosinophils Absolute: 0.2 10*3/uL (ref 0.0–0.5)
Eosinophils Relative: 3 %
HCT: 41 % (ref 39.0–52.0)
Hemoglobin: 13.7 g/dL (ref 13.0–17.0)
Immature Granulocytes: 1 %
Lymphocytes Relative: 31 %
Lymphs Abs: 1.8 10*3/uL (ref 0.7–4.0)
MCH: 31.7 pg (ref 26.0–34.0)
MCHC: 33.4 g/dL (ref 30.0–36.0)
MCV: 94.9 fL (ref 80.0–100.0)
Monocytes Absolute: 0.4 10*3/uL (ref 0.1–1.0)
Monocytes Relative: 7 %
Neutro Abs: 3.3 10*3/uL (ref 1.7–7.7)
Neutrophils Relative %: 58 %
Platelet Count: 90 10*3/uL — ABNORMAL LOW (ref 150–400)
RBC: 4.32 MIL/uL (ref 4.22–5.81)
RDW: 13.4 % (ref 11.5–15.5)
WBC Count: 5.7 10*3/uL (ref 4.0–10.5)
nRBC: 0 % (ref 0.0–0.2)

## 2023-07-26 LAB — CMP (CANCER CENTER ONLY)
ALT: 82 U/L — ABNORMAL HIGH (ref 0–44)
AST: 44 U/L — ABNORMAL HIGH (ref 15–41)
Albumin: 3.8 g/dL (ref 3.5–5.0)
Alkaline Phosphatase: 216 U/L — ABNORMAL HIGH (ref 38–126)
Anion gap: 8 (ref 5–15)
BUN: 18 mg/dL (ref 6–20)
CO2: 28 mmol/L (ref 22–32)
Calcium: 9.7 mg/dL (ref 8.9–10.3)
Chloride: 105 mmol/L (ref 98–111)
Creatinine: 1.06 mg/dL (ref 0.61–1.24)
GFR, Estimated: >60 mL/min (ref >60)
Glucose, Bld: 159 mg/dL — ABNORMAL HIGH (ref 70–99)
Potassium: 3.9 mmol/L (ref 3.5–5.1)
Sodium: 141 mmol/L (ref 135–145)
Total Bilirubin: 0.3 mg/dL (ref <1.2)
Total Protein: 6.5 g/dL (ref 6.5–8.1)

## 2023-07-26 LAB — VITAMIN B12: Vitamin B-12: 1120 pg/mL — ABNORMAL HIGH (ref 180–914)

## 2023-07-26 LAB — HEMOGLOBIN A1C
Hgb A1c MFr Bld: 5.6 % (ref 4.8–5.6)
Mean Plasma Glucose: 114.02 mg/dL

## 2023-07-26 MED ORDER — SODIUM CHLORIDE 0.9 % IV SOLN
132.0000 mg/m2 | Freq: Once | INTRAVENOUS | Status: AC
Start: 2023-07-26 — End: 2023-07-26
  Administered 2023-07-26: 300 mg via INTRAVENOUS
  Filled 2023-07-26: qty 15

## 2023-07-26 MED ORDER — ATROPINE SULFATE 1 MG/ML IV SOLN
0.5000 mg | Freq: Once | INTRAVENOUS | Status: AC | PRN
  Administered 2023-07-26: 0.5 mg via INTRAVENOUS
  Filled 2023-07-26: qty 1

## 2023-07-26 MED ORDER — SODIUM CHLORIDE 0.9 % IV SOLN
150.0000 mg | Freq: Once | INTRAVENOUS | Status: AC
  Administered 2023-07-26: 150 mg via INTRAVENOUS
  Filled 2023-07-26: qty 150

## 2023-07-26 MED ORDER — SODIUM CHLORIDE 0.9 % IV SOLN
400.0000 mg/m2 | Freq: Once | INTRAVENOUS | Status: AC
  Administered 2023-07-26: 824 mg via INTRAVENOUS
  Filled 2023-07-26: qty 41.2

## 2023-07-26 MED ORDER — SODIUM CHLORIDE 0.9 % IV SOLN
2400.0000 mg/m2 | INTRAVENOUS | Status: DC
  Administered 2023-07-26: 5000 mg via INTRAVENOUS
  Filled 2023-07-26: qty 100

## 2023-07-26 MED ORDER — PALONOSETRON HCL INJECTION 0.25 MG/5ML
0.2500 mg | Freq: Once | INTRAVENOUS | Status: AC
  Administered 2023-07-26: 0.25 mg via INTRAVENOUS
  Filled 2023-07-26: qty 5

## 2023-07-26 MED ORDER — SODIUM CHLORIDE 0.9 % IV SOLN: Freq: Once | INTRAVENOUS | Status: AC

## 2023-07-26 MED ORDER — DEXAMETHASONE SODIUM PHOSPHATE 10 MG/ML IJ SOLN
10.0000 mg | Freq: Once | INTRAMUSCULAR | Status: AC
  Administered 2023-07-26: 10 mg via INTRAVENOUS
  Filled 2023-07-26: qty 1

## 2023-07-26 NOTE — Progress Notes (Signed)
Ok to tx with ALT = 82

## 2023-07-26 NOTE — Patient Instructions (Signed)

## 2023-07-26 NOTE — Progress Notes (Signed)
Hematology and Oncology Follow Up Visit  Richard Bowman [id number] 06-16-1976 47 y.o. 07/26/2023  Principle Diagnosis:  Metastatic adenocarcinoma of the colon-liver metastasis -- pMMR/MSI-low -- (+) KRAS/ ERBB2(+)       Iron deficiency anemia Current Therapy:   FOLFOXIRI - s/p cycle #11  - start on  -- 12/09/2022 --oxaliplatin dropped after 04/19/2023 IV iron as indicated     Interim History:  Richard Bowman is back for follow-up.  He seems to be doing pretty well.  He does have a little bit of the neuropathy.  This seems to be improving a little bit.  We drop the oxaliplatin.  His last CEA was down to 10.  He has had no problems with nausea or vomiting.  Has had no diarrhea.  There is been no bleeding.  Marina Goodell did have an episode where he had I think pneumonia.  We will have to see how that looks on his next chest CT scan.  He has had no rashes.  There is been no leg swelling.  He has had no headache.  He has had no mouth sores.  Overall, I would say his performance status is probably ECOG 1.       Wt Readings from Last 3 Encounters:  07/26/23 212 lb (96.2 kg)  07/12/23 207 lb (93.9 kg)  06/22/23 197 lb 15.6 oz (89.8 kg)    Medications:  Current Outpatient Medications:    acetaminophen (TYLENOL) 500 MG tablet, Take 2 tablets (1,000 mg total) by mouth every 6 (six) hours as needed for mild pain., Disp: 30 tablet, Rfl: 0   dexamethasone (DECADRON) 4 MG tablet, Take 2 tablets (8 mg total) by mouth daily for 3 days, starting the day after chemotherapy. Take with food., Disp: 30 tablet, Rfl: 1   docusate sodium (COLACE) 100 MG capsule, Take 1 capsule (100 mg total) by mouth 2 (two) times daily., Disp: 10 capsule, Rfl: 0   gabapentin (NEURONTIN) 300 MG capsule, Take 1 capsule (300 mg total) by mouth 3 (three) times daily., Disp: 90 capsule, Rfl: 2   lactose free nutrition (BOOST) LIQD, Take 237 mLs by mouth daily as needed (Supplement)., Disp: , Rfl:    lactulose (CHRONULAC) 10 GM/15ML  solution, Take 15 mLs (10 g total) by mouth 3 (three) times daily., Disp: 236 mL, Rfl: 0   lidocaine-prilocaine (EMLA) cream, Apply 1 Application topically as needed., Disp: 30 g, Rfl: 0   loperamide (IMODIUM A-D) 2 MG tablet, Take 2 tabs by mouth with first loose stool, then 1 tablet with each additional loose stool as needed. Do not exceed 8 tablets in a 24-hour period., Disp: 100 tablet, Rfl: 3   loratadine (CLARITIN) 10 MG tablet, Take 10 mg by mouth. Takes day of injection-Udenyca, post chemotherapy., Disp: , Rfl:    LORazepam (ATIVAN) 0.5 MG tablet, Place 1 tablet (0.5 mg total) under the tongue every 6 (six) hours as needed for anxiety., Disp: 60 tablet, Rfl: 0   methocarbamol (ROBAXIN) 500 MG tablet, Take 1 tablet (500 mg total) by mouth every 6 (six) hours as needed for muscle spasms., Disp: 30 tablet, Rfl: 0   nicotine (NICODERM CQ - DOSED IN MG/24 HOURS) 21 mg/24hr patch, Place 1 patch (21 mg total) onto the skin daily. (Patient not taking: Reported on 07/12/2023), Disp: 30 patch, Rfl: 1   nystatin (MYCOSTATIN) 100000 UNIT/ML suspension, Take 5 mLs (500,000 Units total) by mouth 4 (four) times daily. (Patient not taking: Reported on 07/12/2023), Disp: 473 mL, Rfl: 1  ondansetron (ZOFRAN) 8 MG tablet, Take 1 tablet (8 mg total) by mouth every 8 (eight) hours as needed for nausea or vomiting. Start on the third day after cisplatin., Disp: 30 tablet, Rfl: 2   oxyCODONE (OXY IR/ROXICODONE) 5 MG immediate release tablet, Take 1 tablet (5 mg total) by mouth every 6 (six) hours as needed for severe pain (pain score 7-10)., Disp: 60 tablet, Rfl: 0   pantoprazole (PROTONIX) 40 MG tablet, Take 1 tablet (40 mg total) by mouth 2 (two) times daily., Disp: 60 tablet, Rfl: 6   polyethylene glycol (MIRALAX / GLYCOLAX) 17 g packet, Take 17 g by mouth daily as needed for mild constipation., Disp: 14 each, Rfl: 0   prochlorperazine (COMPAZINE) 10 MG tablet, Take 1 tablet (10 mg total) by mouth every 6 (six)  hours as needed for nausea or vomiting., Disp: 30 tablet, Rfl: 3  Allergies: No Known Allergies  Past Medical History, Surgical history, Social history, and Family History were reviewed and updated.  Review of Systems: Review of Systems  Constitutional: Negative.   HENT:  Negative.  Negative for hearing loss.   Eyes: Negative.  Negative for eye problems.  Respiratory: Negative.    Cardiovascular: Negative.   Gastrointestinal:  Positive for abdominal pain. Negative for constipation.  Endocrine: Negative.   Genitourinary: Negative.    Musculoskeletal: Negative.   Skin: Negative.   Neurological: Negative.        Numbness and tingling of hands and feet  Hematological: Negative.   Psychiatric/Behavioral: Negative.      Physical Exam: Vitals:   07/26/23 0830  BP: (!) 112/58  Pulse: 68  Resp: 17  Temp: 98 F (36.7 C)  SpO2: 98%   Wt Readings from Last 3 Encounters:  07/26/23 212 lb (96.2 kg)  07/12/23 207 lb (93.9 kg)  06/22/23 197 lb 15.6 oz (89.8 kg)    Physical Exam Vitals reviewed.  HENT:     Head: Normocephalic and atraumatic.  Eyes:     Pupils: Pupils are equal, round, and reactive to light.  Cardiovascular:     Rate and Rhythm: Normal rate and regular rhythm.     Heart sounds: Normal heart sounds.  Pulmonary:     Effort: Pulmonary effort is normal.     Breath sounds: Normal breath sounds.  Abdominal:     General: Bowel sounds are normal.     Palpations: Abdomen is soft.     Comments: Abdominal exam shows a well-healed laparotomy scar.  He has no fluid wave.  There is no guarding or rebound tenderness.    Musculoskeletal:        General: No tenderness or deformity. Normal range of motion.     Cervical back: Normal range of motion.  Lymphadenopathy:     Cervical: No cervical adenopathy.  Skin:    General: Skin is warm and dry.     Findings: No erythema or rash.  Neurological:     Mental Status: He is alert and oriented to person, place, and time.   Psychiatric:        Behavior: Behavior normal.        Thought Content: Thought content normal.        Judgment: Judgment normal.      Lab Results  Component Value Date   WBC 4.8 07/12/2023   HGB 14.3 07/12/2023   HCT 42.2 07/12/2023   MCV 94.2 07/12/2023   PLT 145 (L) 07/12/2023     Chemistry      Component  Value Date/Time   NA 142 07/12/2023 0935   K 4.1 07/12/2023 0935   CL 106 07/12/2023 0935   CO2 28 07/12/2023 0935   BUN 17 07/12/2023 0935   CREATININE 1.14 07/12/2023 0935      Component Value Date/Time   CALCIUM 9.8 07/12/2023 0935   ALKPHOS 171 (H) 07/12/2023 0935   AST 37 07/12/2023 0935   ALT 35 07/12/2023 0935   BILITOT 0.7 07/12/2023 0935     No diagnosis found.  Impression and Plan: Mr. Lamba is a very nice 47 year old white male.  He was found to have metastatic colon cancer. Today he is here for his fourth cycle of chemotherapy.  At this point, we will give him a little bit of a "vacation".  I will then do a CT scan on him.  I will then put him on a maintenance protocol of Xeloda with Avastin.  I think this would be very reasonable..  I really think he does need to have little bit of a break in treatment.  He is done incredibly well.  I will plan to see him back after Christmas.      Josph Macho, MD 11/25/20248:46 AM

## 2023-07-26 NOTE — Patient Instructions (Signed)
[facility] - A DEPT OF [facility]  Discharge Instructions: Thank you for choosing [facility] to provide your oncology and hematology care.   If you have a lab appointment with the Cancer Center, please go directly to the Cancer Center and check in at the registration area.  Wear comfortable clothing and clothing appropriate for easy access to any Portacath or PICC line.   We strive to give you quality time with your provider. You may need to reschedule your appointment if you arrive late (15 or more minutes).  Arriving late affects you and other patients whose appointments are after yours.  Also, if you miss three or more appointments without notifying the office, you may be dismissed from the clinic at the provider's discretion.      For prescription refill requests, have your pharmacy contact our office and allow 72 hours for refills to be completed.    Today you received the following chemotherapy and/or immunotherapy agents leucovorin, 5-FU, cpt-11    To help prevent nausea and vomiting after your treatment, we encourage you to take your nausea medication as directed.  BELOW ARE SYMPTOMS THAT SHOULD BE REPORTED IMMEDIATELY: *FEVER GREATER THAN 100.4 F (38 C) OR HIGHER *CHILLS OR SWEATING *NAUSEA AND VOMITING THAT IS NOT CONTROLLED WITH YOUR NAUSEA MEDICATION *UNUSUAL SHORTNESS OF BREATH *UNUSUAL BRUISING OR BLEEDING *URINARY PROBLEMS (pain or burning when urinating, or frequent urination) *BOWEL PROBLEMS (unusual diarrhea, constipation, pain near the anus) TENDERNESS IN MOUTH AND THROAT WITH OR WITHOUT PRESENCE OF ULCERS (sore throat, sores in mouth, or a toothache) UNUSUAL RASH, SWELLING OR PAIN  UNUSUAL VAGINAL DISCHARGE OR ITCHING   Items with * indicate a potential emergency and should be followed up as soon as possible or go to the Emergency Department if any problems should occur.  Please show the CHEMOTHERAPY ALERT CARD or  IMMUNOTHERAPY ALERT CARD at check-in to the Emergency Department and triage nurse. Should you have questions after your visit or need to cancel or reschedule your appointment, please contact [facility] - A DEPT OF [facility]  [phone] and follow the prompts.  Office hours are 8:00 a.m. to 4:30 p.m. Monday - Friday. Please note that voicemails left after 4:00 p.m. may not be returned until the following business day.  We are closed weekends and major holidays. You have access to a nurse at all times for urgent questions. Please call the main number to the clinic [phone] and follow the prompts.  For any non-urgent questions, you may also contact your provider using MyChart. We now offer e-Visits for anyone 65 and older to request care online for non-urgent symptoms. For details visit [email or web address].   Also download the MyChart app! Go to the app store, search "MyChart", open the app, select , and log in with your MyChart username and password.

## 2023-07-26 NOTE — Progress Notes (Signed)
Per Dr. Myna Hidalgo, okay to treat today with platelets of 90,000

## 2023-07-27 ENCOUNTER — Other Ambulatory Visit (HOSPITAL_BASED_OUTPATIENT_CLINIC_OR_DEPARTMENT_OTHER): Payer: Self-pay

## 2023-07-27 ENCOUNTER — Other Ambulatory Visit: Payer: Self-pay

## 2023-07-27 ENCOUNTER — Encounter: Payer: Self-pay | Admitting: *Deleted

## 2023-07-27 DIAGNOSIS — C189 Malignant neoplasm of colon, unspecified: Secondary | ICD-10-CM

## 2023-07-27 MED ORDER — OXYCODONE HCL 5 MG PO TABS
5.0000 mg | ORAL_TABLET | Freq: Four times a day (QID) | ORAL | 0 refills | Status: DC | PRN
  Filled 2023-07-27: qty 60, 15d supply, fill #0

## 2023-07-27 NOTE — Progress Notes (Signed)
Patient received treatment. He will now have a short treatment holiday followed by CT's prior to transition to maintenance.   Oncology Nurse Navigator Documentation     07/27/2023    7:45 AM  Oncology Nurse Navigator Flowsheets  Navigator Follow Up Date: 08/27/2023  Navigator Follow Up Reason: Follow-up Appointment;Chemotherapy  Navigator Location [facility]  Navigator Encounter Type Appt/Treatment Plan Review  Patient Visit Type MedOnc  Treatment Phase Active Tx  Barriers/Navigation Needs Coordination of Care  Interventions None Required  Acuity Level 1-No Barriers  Time Spent with Patient 15

## 2023-07-28 ENCOUNTER — Inpatient Hospital Stay: Payer: Medicaid Other

## 2023-07-28 VITALS — BP 116/64 | HR 57 | Temp 98.2°F | Resp 17

## 2023-07-28 DIAGNOSIS — Z5111 Encounter for antineoplastic chemotherapy: Secondary | ICD-10-CM | POA: Diagnosis not present

## 2023-07-28 DIAGNOSIS — C189 Malignant neoplasm of colon, unspecified: Secondary | ICD-10-CM

## 2023-07-28 MED ORDER — SODIUM CHLORIDE 0.9% FLUSH
10.0000 mL | INTRAVENOUS | Status: DC | PRN
  Administered 2023-07-28: 10 mL

## 2023-07-28 MED ORDER — PEGFILGRASTIM-JMDB 6 MG/0.6ML ~~LOC~~ SOSY
6.0000 mg | PREFILLED_SYRINGE | Freq: Once | SUBCUTANEOUS | Status: AC
Start: 2023-07-28 — End: 2023-07-28
  Administered 2023-07-28: 6 mg via SUBCUTANEOUS
  Filled 2023-07-28: qty 0.6

## 2023-07-28 MED ORDER — HEPARIN SOD (PORK) LOCK FLUSH 100 UNIT/ML IV SOLN
500.0000 [IU] | Freq: Once | INTRAVENOUS | Status: AC | PRN
Start: 2023-07-28 — End: 2023-07-28
  Administered 2023-07-28: 500 [IU]

## 2023-07-28 NOTE — Patient Instructions (Signed)

## 2023-08-04 ENCOUNTER — Other Ambulatory Visit: Payer: Self-pay

## 2023-08-09 ENCOUNTER — Encounter (HOSPITAL_BASED_OUTPATIENT_CLINIC_OR_DEPARTMENT_OTHER): Payer: Self-pay

## 2023-08-09 ENCOUNTER — Encounter: Payer: Self-pay | Admitting: Hematology & Oncology

## 2023-08-09 ENCOUNTER — Ambulatory Visit (HOSPITAL_BASED_OUTPATIENT_CLINIC_OR_DEPARTMENT_OTHER)
Admission: RE | Admit: 2023-08-09 | Discharge: 2023-08-09 | Disposition: A | Discharge status: Home / Self Care | Payer: Medicaid Other | Source: Ambulatory Visit | Attending: Hematology & Oncology | Admitting: Hematology & Oncology

## 2023-08-09 DIAGNOSIS — C787 Secondary malignant neoplasm of liver and intrahepatic bile duct: Secondary | ICD-10-CM | POA: Diagnosis present

## 2023-08-09 DIAGNOSIS — C189 Malignant neoplasm of colon, unspecified: Secondary | ICD-10-CM | POA: Diagnosis present

## 2023-08-09 MED ORDER — IOHEXOL 300 MG/ML  SOLN
100.0000 mL | Freq: Once | INTRAMUSCULAR | Status: AC | PRN
  Administered 2023-08-09: 100 mL via INTRAVENOUS

## 2023-08-09 NOTE — Telephone Encounter (Signed)
-----   Message from Josph Macho sent at 08/09/2023  3:46 PM EST ----- Please call and let him know that the CT scan shows a his cancer continues to respond.  Great job.  Cindee Lame

## 2023-08-09 NOTE — Telephone Encounter (Signed)
Called patient to inform him that the CT scan shows that his cancer continues to respond to the treatment. Patient inquired about his last glucose level and the result was provided to him.  Patient also said that his PCP wants him to have a colonoscopy and he would like Dr. Gustavo Lah opinion on that.  Dr. Myna Hidalgo looked at the patient chart and reviewed the report of the colonoscopy that was done in March, 2024.He wanted me to tell the patient that he does not think that a colonoscopy is necessary at this time. Patient was informed and verbalized understanding and gratitude for the information.

## 2023-08-11 ENCOUNTER — Telehealth: Payer: Self-pay | Admitting: Gastroenterology

## 2023-08-11 NOTE — Telephone Encounter (Signed)
Patient called and stated that he does not need a colonoscopy due to him having a surgery at the hospital back in march. Patient also stated he would like a call back Please advise.

## 2023-08-12 ENCOUNTER — Telehealth: Payer: Self-pay

## 2023-08-12 ENCOUNTER — Other Ambulatory Visit: Payer: Self-pay

## 2023-08-12 DIAGNOSIS — C189 Malignant neoplasm of colon, unspecified: Secondary | ICD-10-CM

## 2023-08-12 MED ORDER — OXYCODONE HCL 5 MG PO TABS
5.0000 mg | ORAL_TABLET | Freq: Four times a day (QID) | ORAL | 0 refills | Status: DC | PRN
  Filled 2023-08-12: qty 60, 15d supply, fill #0

## 2023-08-12 MED ORDER — GABAPENTIN 300 MG PO CAPS
300.0000 mg | ORAL_CAPSULE | Freq: Three times a day (TID) | ORAL | 2 refills | Status: DC
  Filled 2023-08-12: qty 90, 30d supply, fill #0

## 2023-08-12 NOTE — Telephone Encounter (Signed)
Under treatment for colon cancer. Recent pneumonia. States surgeon told him he did not need another colonoscopy yet.  Please advise.

## 2023-08-12 NOTE — Telephone Encounter (Signed)
ok 

## 2023-08-12 NOTE — Telephone Encounter (Signed)
Pt calling and requesting results from his CT scan. Pt educated that the CT scan shows his cancer continues to respond to treatment. Pt aware that his medications were refilled per his request. Pt verbalized understanding and had no further questions.  Pt denied any other concerns.

## 2023-08-13 ENCOUNTER — Other Ambulatory Visit (HOSPITAL_BASED_OUTPATIENT_CLINIC_OR_DEPARTMENT_OTHER): Payer: Self-pay

## 2023-08-13 ENCOUNTER — Other Ambulatory Visit: Payer: Self-pay

## 2023-08-27 ENCOUNTER — Other Ambulatory Visit (HOSPITAL_BASED_OUTPATIENT_CLINIC_OR_DEPARTMENT_OTHER): Payer: Medicaid Other

## 2023-08-27 ENCOUNTER — Encounter: Payer: Self-pay | Admitting: Hematology & Oncology

## 2023-08-27 ENCOUNTER — Other Ambulatory Visit: Payer: Self-pay

## 2023-08-27 ENCOUNTER — Inpatient Hospital Stay: Payer: Medicaid Other

## 2023-08-27 ENCOUNTER — Telehealth: Payer: Self-pay

## 2023-08-27 ENCOUNTER — Inpatient Hospital Stay: Payer: Medicaid Other | Attending: Hematology & Oncology

## 2023-08-27 ENCOUNTER — Encounter: Payer: Self-pay | Admitting: *Deleted

## 2023-08-27 ENCOUNTER — Inpatient Hospital Stay (HOSPITAL_BASED_OUTPATIENT_CLINIC_OR_DEPARTMENT_OTHER): Payer: Medicaid Other | Admitting: Hematology & Oncology

## 2023-08-27 ENCOUNTER — Other Ambulatory Visit (HOSPITAL_COMMUNITY): Payer: Self-pay

## 2023-08-27 ENCOUNTER — Telehealth: Payer: Self-pay | Admitting: Pharmacist

## 2023-08-27 VITALS — BP 125/79 | HR 67 | Temp 98.3°F | Resp 18 | Ht 70.0 in | Wt 213.0 lb

## 2023-08-27 DIAGNOSIS — Z95828 Presence of other vascular implants and grafts: Secondary | ICD-10-CM

## 2023-08-27 DIAGNOSIS — C189 Malignant neoplasm of colon, unspecified: Secondary | ICD-10-CM

## 2023-08-27 DIAGNOSIS — C787 Secondary malignant neoplasm of liver and intrahepatic bile duct: Secondary | ICD-10-CM | POA: Diagnosis not present

## 2023-08-27 DIAGNOSIS — Z79899 Other long term (current) drug therapy: Secondary | ICD-10-CM | POA: Insufficient documentation

## 2023-08-27 DIAGNOSIS — D509 Iron deficiency anemia, unspecified: Secondary | ICD-10-CM | POA: Diagnosis not present

## 2023-08-27 DIAGNOSIS — G629 Polyneuropathy, unspecified: Secondary | ICD-10-CM

## 2023-08-27 DIAGNOSIS — C187 Malignant neoplasm of sigmoid colon: Secondary | ICD-10-CM | POA: Diagnosis present

## 2023-08-27 LAB — CBC WITH DIFFERENTIAL (CANCER CENTER ONLY)
Abs Immature Granulocytes: 0.02 10*3/uL (ref 0.00–0.07)
Basophils Absolute: 0 10*3/uL (ref 0.0–0.1)
Basophils Relative: 0 %
Eosinophils Absolute: 0.5 10*3/uL (ref 0.0–0.5)
Eosinophils Relative: 5 %
HCT: 42.7 % (ref 39.0–52.0)
Hemoglobin: 14.8 g/dL (ref 13.0–17.0)
Immature Granulocytes: 0 %
Lymphocytes Relative: 24 %
Lymphs Abs: 2.1 10*3/uL (ref 0.7–4.0)
MCH: 31.6 pg (ref 26.0–34.0)
MCHC: 34.7 g/dL (ref 30.0–36.0)
MCV: 91.2 fL (ref 80.0–100.0)
Monocytes Absolute: 0.8 10*3/uL (ref 0.1–1.0)
Monocytes Relative: 9 %
Neutro Abs: 5.4 10*3/uL (ref 1.7–7.7)
Neutrophils Relative %: 62 %
Platelet Count: 157 10*3/uL (ref 150–400)
RBC: 4.68 MIL/uL (ref 4.22–5.81)
RDW: 13.2 % (ref 11.5–15.5)
WBC Count: 8.8 10*3/uL (ref 4.0–10.5)
nRBC: 0 % (ref 0.0–0.2)

## 2023-08-27 LAB — CMP (CANCER CENTER ONLY)
ALT: 44 U/L (ref 0–44)
AST: 35 U/L (ref 15–41)
Albumin: 4.1 g/dL (ref 3.5–5.0)
Alkaline Phosphatase: 179 U/L — ABNORMAL HIGH (ref 38–126)
Anion gap: 8 (ref 5–15)
BUN: 14 mg/dL (ref 6–20)
CO2: 29 mmol/L (ref 22–32)
Calcium: 9.7 mg/dL (ref 8.9–10.3)
Chloride: 104 mmol/L (ref 98–111)
Creatinine: 1.08 mg/dL (ref 0.61–1.24)
GFR, Estimated: >60 mL/min (ref >60)
Glucose, Bld: 95 mg/dL (ref 70–99)
Potassium: 3.5 mmol/L (ref 3.5–5.1)
Sodium: 141 mmol/L (ref 135–145)
Total Bilirubin: 0.4 mg/dL (ref <1.2)
Total Protein: 6.6 g/dL (ref 6.5–8.1)

## 2023-08-27 LAB — LACTATE DEHYDROGENASE: LDH: 135 U/L (ref 98–192)

## 2023-08-27 LAB — VITAMIN B12: Vitamin B-12: 915 pg/mL — ABNORMAL HIGH (ref 180–914)

## 2023-08-27 MED ORDER — CAPECITABINE 500 MG PO TABS: 1250.0000 mg/m2 | ORAL_TABLET | Freq: Two times a day (BID) | ORAL | 3 refills | Status: DC

## 2023-08-27 MED ORDER — HEPARIN SOD (PORK) LOCK FLUSH 100 UNIT/ML IV SOLN
500.0000 [IU] | Freq: Once | INTRAVENOUS | Status: AC
  Administered 2023-08-27: 500 [IU] via INTRAVENOUS

## 2023-08-27 MED ORDER — SODIUM CHLORIDE 0.9% FLUSH
10.0000 mL | INTRAVENOUS | Status: DC | PRN
  Administered 2023-08-27: 10 mL via INTRAVENOUS

## 2023-08-27 MED ORDER — CAPECITABINE 500 MG PO TABS
1250.0000 mg/m2 | ORAL_TABLET | Freq: Two times a day (BID) | ORAL | 3 refills | Status: DC
  Filled 2023-08-31: qty 140, 21d supply, fill #0
  Filled 2023-09-29 (×2): qty 140, 28d supply, fill #1

## 2023-08-27 NOTE — Progress Notes (Signed)
Hematology and Oncology Follow Up Visit  Richard Bowman [id number] February 09, 1976 47 y.o. 08/27/2023  Principle Diagnosis:  Metastatic adenocarcinoma of the colon-liver metastasis -- pMMR/MSI-low -- (+) KRAS/ ERBB2(+)       Iron deficiency anemia Current Therapy:   FOLFOXIRI - s/p cycle #11  - start on  -- 12/09/2022 --oxaliplatin dropped after 04/19/2023 IV iron as indicated Xeloda/Avastin -- start cycle #1 on 09/01/2023     Interim History:  Richard Bowman is back for follow-up.  He had a follow-up CT scan.  This was done on 08/09/2023.  Everything looked fantastic.  He continued to have a response.  He had partially calcified hepatic mets which are decreased in size.  His pulmonary nodules had all resolved.  He has been off treatment now for about a month.  We would now get him on maintenance therapy with Xeloda/Avastin.  His last CEA level was down to 10.  He is complaining some pain in the right side of his neck.  Has been coughing quite a bit.  He does have the Port-A-Cath on the right side.  We probably had to get a ultrasound to see that he does not have a thrombus in the internal jugular vein.  He is also has a bit of abdominal discomfort from coughing.  He has had no change in bowel or bladder habits.  He has had no bleeding.  He has had no diarrhea.  He has had no leg swelling.  There is been no fever.  He has had no rashes.  Overall, I would say his performance status is probably ECOG 1.     Wt Readings from Last 3 Encounters:  08/27/23 213 lb (96.6 kg)  07/26/23 212 lb (96.2 kg)  07/12/23 207 lb (93.9 kg)    Medications:  Current Outpatient Medications:    acetaminophen (TYLENOL) 500 MG tablet, Take 2 tablets (1,000 mg total) by mouth every 6 (six) hours as needed for mild pain., Disp: 30 tablet, Rfl: 0   dexamethasone (DECADRON) 4 MG tablet, Take 2 tablets (8 mg total) by mouth daily for 3 days, starting the day after chemotherapy. Take with food., Disp: 30 tablet,  Rfl: 1   docusate sodium (COLACE) 100 MG capsule, Take 1 capsule (100 mg total) by mouth 2 (two) times daily., Disp: 10 capsule, Rfl: 0   gabapentin (NEURONTIN) 300 MG capsule, Take 1 capsule (300 mg total) by mouth 3 (three) times daily., Disp: 90 capsule, Rfl: 2   lactose free nutrition (BOOST) LIQD, Take 237 mLs by mouth daily as needed (Supplement)., Disp: , Rfl:    lactulose (CHRONULAC) 10 GM/15ML solution, Take 15 mLs (10 g total) by mouth 3 (three) times daily., Disp: 236 mL, Rfl: 0   lidocaine-prilocaine (EMLA) cream, Apply 1 Application topically as needed., Disp: 30 g, Rfl: 0   loperamide (IMODIUM A-D) 2 MG tablet, Take 2 tabs by mouth with first loose stool, then 1 tablet with each additional loose stool as needed. Do not exceed 8 tablets in a 24-hour period., Disp: 100 tablet, Rfl: 3   loratadine (CLARITIN) 10 MG tablet, Take 10 mg by mouth. Takes day of injection-Udenyca, post chemotherapy., Disp: , Rfl:    LORazepam (ATIVAN) 0.5 MG tablet, Place 1 tablet (0.5 mg total) under the tongue every 6 (six) hours as needed for anxiety., Disp: 60 tablet, Rfl: 0   methocarbamol (ROBAXIN) 500 MG tablet, Take 1 tablet (500 mg total) by mouth every 6 (six) hours as needed for muscle spasms.,  Disp: 30 tablet, Rfl: 0   nicotine (NICODERM CQ - DOSED IN MG/24 HOURS) 21 mg/24hr patch, Place 1 patch (21 mg total) onto the skin daily. (Patient not taking: Reported on 07/12/2023), Disp: 30 patch, Rfl: 1   nystatin (MYCOSTATIN) 100000 UNIT/ML suspension, Take 5 mLs (500,000 Units total) by mouth 4 (four) times daily. (Patient not taking: Reported on 07/12/2023), Disp: 473 mL, Rfl: 1   ondansetron (ZOFRAN) 8 MG tablet, Take 1 tablet (8 mg total) by mouth every 8 (eight) hours as needed for nausea or vomiting. Start on the third day after cisplatin., Disp: 30 tablet, Rfl: 2   oxyCODONE (OXY IR/ROXICODONE) 5 MG immediate release tablet, Take 1 tablet (5 mg total) by mouth every 6 (six) hours as needed for severe  pain (pain score 7-10)., Disp: 60 tablet, Rfl: 0   pantoprazole (PROTONIX) 40 MG tablet, Take 1 tablet (40 mg total) by mouth 2 (two) times daily., Disp: 60 tablet, Rfl: 6   polyethylene glycol (MIRALAX / GLYCOLAX) 17 g packet, Take 17 g by mouth daily as needed for mild constipation., Disp: 14 each, Rfl: 0   prochlorperazine (COMPAZINE) 10 MG tablet, Take 1 tablet (10 mg total) by mouth every 6 (six) hours as needed for nausea or vomiting., Disp: 30 tablet, Rfl: 3  Allergies: No Known Allergies  Past Medical History, Surgical history, Social history, and Family History were reviewed and updated.  Review of Systems: Review of Systems  Constitutional: Negative.   HENT:  Negative.  Negative for hearing loss.   Eyes: Negative.  Negative for eye problems.  Respiratory: Negative.    Cardiovascular: Negative.   Gastrointestinal:  Positive for abdominal pain. Negative for constipation.  Endocrine: Negative.   Genitourinary: Negative.    Musculoskeletal: Negative.   Skin: Negative.   Neurological: Negative.        Numbness and tingling of hands and feet  Hematological: Negative.   Psychiatric/Behavioral: Negative.      Physical Exam: Vitals:   08/27/23 1002  BP: 125/79  Pulse: 67  Resp: 18  Temp: 98.3 F (36.8 C)  SpO2: 98%   Wt Readings from Last 3 Encounters:  08/27/23 213 lb (96.6 kg)  07/26/23 212 lb (96.2 kg)  07/12/23 207 lb (93.9 kg)    Physical Exam Vitals reviewed.  HENT:     Head: Normocephalic and atraumatic.  Eyes:     Pupils: Pupils are equal, round, and reactive to light.  Cardiovascular:     Rate and Rhythm: Normal rate and regular rhythm.     Heart sounds: Normal heart sounds.  Pulmonary:     Effort: Pulmonary effort is normal.     Breath sounds: Normal breath sounds.  Abdominal:     General: Bowel sounds are normal.     Palpations: Abdomen is soft.     Comments: Abdominal exam shows a well-healed laparotomy scar.  He has no fluid wave.  There is no  guarding or rebound tenderness.    Musculoskeletal:        General: No tenderness or deformity. Normal range of motion.     Cervical back: Normal range of motion.  Lymphadenopathy:     Cervical: No cervical adenopathy.  Skin:    General: Skin is warm and dry.     Findings: No erythema or rash.  Neurological:     Mental Status: He is alert and oriented to person, place, and time.  Psychiatric:        Behavior: Behavior normal.  Thought Content: Thought content normal.        Judgment: Judgment normal.     Lab Results  Component Value Date   WBC 8.8 08/27/2023   HGB 14.8 08/27/2023   HCT 42.7 08/27/2023   MCV 91.2 08/27/2023   PLT 157 08/27/2023     Chemistry      Component Value Date/Time   NA 141 08/27/2023 0925   K 3.5 08/27/2023 0925   CL 104 08/27/2023 0925   CO2 29 08/27/2023 0925   BUN 14 08/27/2023 0925   CREATININE 1.08 08/27/2023 0925      Component Value Date/Time   CALCIUM 9.7 08/27/2023 0925   ALKPHOS 179 (H) 08/27/2023 0925   AST 35 08/27/2023 0925   ALT 44 08/27/2023 0925   BILITOT 0.4 08/27/2023 0925     No diagnosis found.  Impression and Plan: Mr. Mosteller is a very nice 47 year old white male.  He was found to have metastatic colon cancer. Today he is here for his fourth cycle of chemotherapy.  I will now get him on Xeloda/Avastin.  I talked him about this.  I explained the Xeloda.  I told him that he would take this twice a day for 14 days on and 7 days off.  I told him about the possibility of his hands and feet getting red, swollen and cracking the skin.  I told him to go to the drugstore and buy some over-the-counter Voltaren cream and use this twice a day.  I talked him about the Avastin.  I explained to him how the Avastin works.  I explained the side effects of the Avastin.  I think that he will do okay with the Avastin.  We will try to get everything started in a couple weeks or so.  I will plan to see him back when he has his  second Avastin dose.   Josph Macho, MD 12/27/202410:27 AM

## 2023-08-27 NOTE — Progress Notes (Signed)
Patient will now transition to maintenance treatment. Order for [facility] placed. He did c/o pain and swelling to his neck. [place] ordered to r/o thrombus. Scheduled for 08/30/2023.  Oncology Nurse Navigator Documentation     08/27/2023    9:45 AM  Oncology Nurse Navigator Flowsheets  Navigator Follow Up Date: 08/30/2023  Navigator Follow Up Reason: Scan Review  Navigator Location [facility]  Navigator Encounter Type Follow-up Appt  Patient Visit Type MedOnc  Treatment Phase Active Tx  Barriers/Navigation Needs Coordination of Care  Interventions None Required  Acuity Level 1-No Barriers  Time Spent with Patient 15

## 2023-08-27 NOTE — Telephone Encounter (Signed)
Oral Oncology Pharmacist Encounter  Received new prescription for Xeloda (capecitabine) for the treatment of metastatic colon cancer in conjunction with bevacizumab, planned duration until disease progression or unacceptable drug toxicity.  CBC w/ Diff and CMP from 08/27/23 assessed, no relevant lab abnormalities requiring baseline dose adjustment required at this time. Prescription dose and frequency assessed for appropriateness.  Current medication list in Epic reviewed, DDIs with Xeloda identified: Category C DDI between Xeloda and Pantoprazole - proton-pump inhibitors can decrease efficacy of Xeloda - will discuss with patient alternatives to pantoprazole, such as H2RA's like famotidine while on Xeloda.  Evaluated chart and no patient barriers to medication adherence noted.   Prescription has been e-scribed to the [facility] for benefits analysis and approval.  Oral Oncology Clinic will continue to follow for insurance authorization, copayment issues, initial counseling and start date.  Sherry Ruffing, PharmD, BCPS, BCOP Hematology/Oncology Clinical Pharmacist [facility] and [facility] Oral Chemotherapy Navigation Clinics [phone] 08/27/2023 1:54 PM

## 2023-08-27 NOTE — Patient Instructions (Signed)

## 2023-08-27 NOTE — Telephone Encounter (Signed)
Oral Oncology Patient Advocate Encounter  After completing a benefits investigation, prior authorization for Capecitabine is not required at this time through [facility] [place] ([employer]).  Patient's copay is $4.00.     Ardeen Fillers, CPhT Oncology Pharmacy Patient Advocate  [facility]  [phone] (phone) [phone] (fax) 08/27/2023 2:12 PM

## 2023-08-30 ENCOUNTER — Ambulatory Visit (HOSPITAL_BASED_OUTPATIENT_CLINIC_OR_DEPARTMENT_OTHER)
Admission: RE | Admit: 2023-08-30 | Discharge: 2023-08-30 | Disposition: A | Discharge status: Home / Self Care | Payer: Medicaid Other | Source: Ambulatory Visit | Attending: Hematology & Oncology | Admitting: Hematology & Oncology

## 2023-08-30 ENCOUNTER — Other Ambulatory Visit: Payer: Self-pay | Admitting: Hematology & Oncology

## 2023-08-30 ENCOUNTER — Other Ambulatory Visit (HOSPITAL_BASED_OUTPATIENT_CLINIC_OR_DEPARTMENT_OTHER): Payer: Self-pay

## 2023-08-30 ENCOUNTER — Other Ambulatory Visit: Payer: Self-pay | Admitting: *Deleted

## 2023-08-30 DIAGNOSIS — Z95828 Presence of other vascular implants and grafts: Secondary | ICD-10-CM | POA: Insufficient documentation

## 2023-08-30 DIAGNOSIS — C189 Malignant neoplasm of colon, unspecified: Secondary | ICD-10-CM

## 2023-08-30 NOTE — Telephone Encounter (Signed)
Called patient to OnBoard. Left VM. I will continue to reach out to the patient.   Call 1 - LVM 08/30/23   Ardeen Fillers, CPhT Oncology Pharmacy Patient Advocate  [facility]  [phone] (phone) [phone] (fax) 08/30/2023 9:56 AM

## 2023-08-31 ENCOUNTER — Other Ambulatory Visit: Payer: Self-pay

## 2023-08-31 ENCOUNTER — Encounter: Payer: Self-pay | Admitting: Hematology & Oncology

## 2023-08-31 ENCOUNTER — Other Ambulatory Visit (HOSPITAL_BASED_OUTPATIENT_CLINIC_OR_DEPARTMENT_OTHER): Payer: Self-pay

## 2023-08-31 ENCOUNTER — Encounter: Payer: Self-pay | Admitting: *Deleted

## 2023-08-31 MED ORDER — OXYCODONE HCL 5 MG PO TABS
5.0000 mg | ORAL_TABLET | Freq: Four times a day (QID) | ORAL | 0 refills | Status: DC | PRN
  Filled 2023-08-31: qty 60, 15d supply, fill #0

## 2023-08-31 NOTE — Progress Notes (Signed)
 Oral Chemotherapy Pharmacist Encounter  Patient was counseled under telephone encounter from 08/27/23.  Richard Bowman, PharmD, BCPS, BCOP Hematology/Oncology Clinical Pharmacist [facility] and [facility] Oral Chemotherapy Navigation Clinics [phone] 08/31/2023 1:44 PM

## 2023-08-31 NOTE — Progress Notes (Signed)
 Reviewed US  which is negative for thrombus, however does show a possible fibrin sheath surrounding the catheter. Reviewed with Dr Timmy. No further action at this time.   Oncology Nurse Navigator Documentation     08/31/2023    8:00 AM  Oncology Nurse Navigator Flowsheets  Navigator Follow Up Date: 09/10/2023  Navigator Follow Up Reason: Follow-up Appointment;Chemotherapy  Navigator Location [facility]  Navigator Encounter Type Scan Review  Patient Visit Type MedOnc  Treatment Phase Active Tx  Barriers/Navigation Needs Coordination of Care  Interventions None Required  Acuity Level 1-No Barriers  Time Spent with Patient 15

## 2023-08-31 NOTE — Telephone Encounter (Signed)
 Patient successfully OnBoarded and drug education provided by pharmacist. Medication scheduled to be picked up on Friday, 09/03/23, from [facility]. Patient also knows to call me at [phone] with any questions or concerns regarding receiving medication or if there is any unexpected change in co-pay.   Morene Potters, CPhT Oncology Pharmacy Patient Advocate  [facility]  [phone] (phone) [phone] (fax) 08/31/2023 1:36 PM

## 2023-08-31 NOTE — Progress Notes (Signed)
 Specialty Pharmacy Initial Fill Coordination Note  Richard Bowman is a 47 y.o. male contacted today regarding initial fill of specialty medication(s) Capecitabine  (XELODA )  Patient requested [name] at [facility] at [place] date: 09/03/23  Medication will be filled on 09/02/23.   Patient is aware of $4.00 copayment.    Morene Potters, CPhT Oncology Pharmacy Patient Advocate  [facility]  [phone] (phone) [phone] (fax) 08/31/2023 1:30 PM

## 2023-08-31 NOTE — Telephone Encounter (Signed)
 Oral Chemotherapy Pharmacist Encounter  I spoke with patient for overview of: Xeloda  (capecitabine ) for the treatment of metastatic colon cancer in conjunction with bevacizumab , planned duration until disease progression or unacceptable drug toxicity.   Counseled patient on administration, dosing, side effects, monitoring, drug-food interactions, safe handling, storage, and disposal.  Patient will take Xeloda  500mg  tablets, 5 tablets (2500mg ) by mouth in AM and 5 tabs (2500mg ) by mouth in PM, within 30 minutes of finishing meals, for 14 days on, 7 days off, repeated every 21 days.  Xeloda  start date: pending - patient unsure if he want to start before speaking to Dr. Timmy at next appt on 09/10/23. Patient will have medication in hand starting 09/03/23 if he would like to start before office visit.   Adverse effects include but are not limited to: fatigue, decreased blood counts, GI upset, diarrhea, mouth sores, and hand-foot syndrome. Hand-foot syndrome: discussed use of cream such as [place] Smooth Extra Care 20 or equivalent advanced care cream that has 20% urea content for advanced skin hydration while on Xeloda . Additionally discussed use of OTC Voltaren gel for HFS prophylaxis. Discussed recommended use of Voltaren gel is 1 finger tip application for front/backside of hands and then 1 fingertip application to bottoms of feet twice a day for up to 12 weeks.  Diarrhea: Patient will obtain Imodium  (loperamide ) to have on hand if they experience diarrhea. Patient knows to alert the office of 4 or more loose stools above baseline.  Reviewed with patient importance of keeping a medication schedule and plan for any missed doses. No barriers to medication adherence identified.  Medication reconciliation performed and medication/allergy list updated. Patient states he does not take pantoprazole  currently. Recommended that if patient does need medication for acid reflux while on Xeloda  to utilize either  Tums or Pepcid  (famotidine ) for acid reflux.   All questions answered.  Mr. Wint voiced understanding and appreciation.   Medication education handout placed in mail for patient. Patient knows to call the office with questions or concerns. Oral Chemotherapy Clinic phone number provided to patient.   Asberry Macintosh, PharmD, BCPS, BCOP Hematology/Oncology Clinical Pharmacist [facility] and [facility] Oral Chemotherapy Navigation Clinics [phone] 08/31/2023 1:41 PM

## 2023-09-02 ENCOUNTER — Other Ambulatory Visit: Payer: Self-pay

## 2023-09-02 NOTE — Progress Notes (Signed)
 Pharmacist Chemotherapy Monitoring - Initial Assessment    Anticipated start date: 09/10/23   The following has been reviewed per standard work regarding the patient's treatment regimen: The patient's diagnosis, treatment plan and drug doses, and organ/hematologic function Lab orders and baseline tests specific to treatment regimen  The treatment plan start date, drug sequencing, and pre-medications Prior authorization status  Patient's documented medication list, including drug-drug interaction screen and prescriptions for anti-emetics and supportive care specific to the treatment regimen The drug concentrations, fluid compatibility, administration routes, and timing of the medications to be used The patient's access for treatment and lifetime cumulative dose history, if applicable  The patient's medication allergies and previous infusion related reactions, if applicable   Changes made to treatment plan:  treatment plan date  Follow up needed:  N/A   Richard Bowman, Olam Browning, [facility], 09/02/2023  3:11 PM

## 2023-09-03 ENCOUNTER — Other Ambulatory Visit: Payer: Self-pay

## 2023-09-06 ENCOUNTER — Other Ambulatory Visit: Payer: Self-pay

## 2023-09-06 ENCOUNTER — Other Ambulatory Visit (HOSPITAL_COMMUNITY): Payer: Self-pay

## 2023-09-07 ENCOUNTER — Other Ambulatory Visit: Payer: Self-pay

## 2023-09-10 ENCOUNTER — Encounter: Payer: Self-pay | Admitting: Hematology & Oncology

## 2023-09-10 ENCOUNTER — Encounter: Payer: Self-pay | Admitting: *Deleted

## 2023-09-10 ENCOUNTER — Inpatient Hospital Stay: Payer: Medicaid Other

## 2023-09-10 ENCOUNTER — Inpatient Hospital Stay: Payer: Medicaid Other | Attending: Hematology & Oncology

## 2023-09-10 ENCOUNTER — Inpatient Hospital Stay (HOSPITAL_BASED_OUTPATIENT_CLINIC_OR_DEPARTMENT_OTHER): Payer: Medicaid Other | Admitting: Hematology & Oncology

## 2023-09-10 VITALS — BP 121/76 | HR 58 | Temp 98.2°F | Resp 18 | Ht 70.0 in | Wt 216.8 lb

## 2023-09-10 DIAGNOSIS — C787 Secondary malignant neoplasm of liver and intrahepatic bile duct: Secondary | ICD-10-CM | POA: Insufficient documentation

## 2023-09-10 DIAGNOSIS — M79602 Pain in left arm: Secondary | ICD-10-CM | POA: Diagnosis not present

## 2023-09-10 DIAGNOSIS — Z5112 Encounter for antineoplastic immunotherapy: Secondary | ICD-10-CM | POA: Diagnosis present

## 2023-09-10 DIAGNOSIS — C189 Malignant neoplasm of colon, unspecified: Secondary | ICD-10-CM

## 2023-09-10 DIAGNOSIS — C187 Malignant neoplasm of sigmoid colon: Secondary | ICD-10-CM | POA: Diagnosis present

## 2023-09-10 DIAGNOSIS — Z79899 Other long term (current) drug therapy: Secondary | ICD-10-CM | POA: Insufficient documentation

## 2023-09-10 LAB — CBC WITH DIFFERENTIAL (CANCER CENTER ONLY)
Abs Immature Granulocytes: 0.02 10*3/uL (ref 0.00–0.07)
Basophils Absolute: 0 10*3/uL (ref 0.0–0.1)
Basophils Relative: 1 %
Eosinophils Absolute: 0.4 10*3/uL (ref 0.0–0.5)
Eosinophils Relative: 6 %
HCT: 44.1 % (ref 39.0–52.0)
Hemoglobin: 15.2 g/dL (ref 13.0–17.0)
Immature Granulocytes: 0 %
Lymphocytes Relative: 30 %
Lymphs Abs: 2.3 10*3/uL (ref 0.7–4.0)
MCH: 31.1 pg (ref 26.0–34.0)
MCHC: 34.5 g/dL (ref 30.0–36.0)
MCV: 90.4 fL (ref 80.0–100.0)
Monocytes Absolute: 0.6 10*3/uL (ref 0.1–1.0)
Monocytes Relative: 8 %
Neutro Abs: 4.3 10*3/uL (ref 1.7–7.7)
Neutrophils Relative %: 55 %
Platelet Count: 120 10*3/uL — ABNORMAL LOW (ref 150–400)
RBC: 4.88 MIL/uL (ref 4.22–5.81)
RDW: 13.1 % (ref 11.5–15.5)
WBC Count: 7.7 10*3/uL (ref 4.0–10.5)
nRBC: 0 % (ref 0.0–0.2)

## 2023-09-10 LAB — CMP (CANCER CENTER ONLY)
ALT: 43 U/L (ref 0–44)
AST: 38 U/L (ref 15–41)
Albumin: 3.9 g/dL (ref 3.5–5.0)
Alkaline Phosphatase: 159 U/L — ABNORMAL HIGH (ref 38–126)
Anion gap: 9 (ref 5–15)
BUN: 13 mg/dL (ref 6–20)
CO2: 25 mmol/L (ref 22–32)
Calcium: 9.3 mg/dL (ref 8.9–10.3)
Chloride: 106 mmol/L (ref 98–111)
Creatinine: 0.98 mg/dL (ref 0.61–1.24)
GFR, Estimated: >60 mL/min (ref >60)
Glucose, Bld: 105 mg/dL — ABNORMAL HIGH (ref 70–99)
Potassium: 3.8 mmol/L (ref 3.5–5.1)
Sodium: 140 mmol/L (ref 135–145)
Total Bilirubin: 0.4 mg/dL (ref 0.0–1.2)
Total Protein: 7.1 g/dL (ref 6.5–8.1)

## 2023-09-10 LAB — TOTAL PROTEIN, URINE DIPSTICK: Protein, ur: NEGATIVE mg/dL

## 2023-09-10 MED ORDER — HEPARIN SOD (PORK) LOCK FLUSH 100 UNIT/ML IV SOLN
500.0000 [IU] | Freq: Once | INTRAVENOUS | Status: AC | PRN
  Administered 2023-09-10: 500 [IU]

## 2023-09-10 MED ORDER — SODIUM CHLORIDE 0.9 % IV SOLN
7.5000 mg/kg | Freq: Once | INTRAVENOUS | Status: AC
  Administered 2023-09-10: 700 mg via INTRAVENOUS
  Filled 2023-09-10: qty 16

## 2023-09-10 MED ORDER — SODIUM CHLORIDE 0.9 % IV SOLN: INTRAVENOUS | Status: DC

## 2023-09-10 MED ORDER — SODIUM CHLORIDE 0.9% FLUSH
10.0000 mL | INTRAVENOUS | Status: DC | PRN
  Administered 2023-09-10: 10 mL

## 2023-09-10 NOTE — Progress Notes (Signed)
 Hematology and Oncology Follow Up Visit  Richard Bowman [id number] 05-07-1976 48 y.o. 09/10/2023  Principle Diagnosis:  Metastatic adenocarcinoma of the colon-liver metastasis -- pMMR/MSI-low -- (+) KRAS/ ERBB2(+)       Iron deficiency anemia Current Therapy:   FOLFOXIRI - s/p cycle #11  - start on  -- 12/09/2022 --oxaliplatin  dropped after 04/19/2023 IV iron as indicated Xeloda /Avastin  -- start cycle #1 on 09/13/2023     Interim History:  Mr. Kittelson is back for follow-up.  He had a follow-up CT scan.  This was done on 08/09/2023.  Everything looked fantastic.  He continued to have a response.  He had partially calcified hepatic mets which are decreased in size.  His pulmonary nodules had all resolved.  He has been off treatment now for about a month.  We will now get him on maintenance therapy with Xeloda /Avastin .  I told him to start the Xeloda  on Monday the 13th.  His last CEA level was down to 10.  Thankfully, we did a ultrasound of his neck because he was complaining of pain on the right side of his neck.  There is no thrombus in the jugular vein.  There was some material around the Port-A-Cath tip.  Is hard to say what this is.  However, his neck is doing better.  He now complains of pain in the left arm.  It goes down to his first 2 digits.  Again I am not sure exactly what this could be.  He says he has little bit of discomfort in the chest.  Again with his last CT scan, there is no evidence of malignancy in the chest.  His appetite is doing okay.  I think he is still smoking a little bit.  Overall, I would say his performance status is probably ECOG 1.    Wt Readings from Last 3 Encounters:  09/10/23 216 lb 12.8 oz (98.3 kg)  08/27/23 213 lb (96.6 kg)  07/26/23 212 lb (96.2 kg)    Medications:  Current Outpatient Medications:    acetaminophen  (TYLENOL ) 500 MG tablet, Take 2 tablets (1,000 mg total) by mouth every 6 (six) hours as needed for mild pain., Disp: 30 tablet,  Rfl: 0   docusate sodium  (COLACE) 100 MG capsule, Take 1 capsule (100 mg total) by mouth 2 (two) times daily., Disp: 10 capsule, Rfl: 0   gabapentin  (NEURONTIN ) 300 MG capsule, Take 1 capsule (300 mg total) by mouth 3 (three) times daily., Disp: 90 capsule, Rfl: 2   lactose free nutrition (BOOST) LIQD, Take 237 mLs by mouth daily as needed (Supplement)., Disp: , Rfl:    lactulose  (CHRONULAC ) 10 GM/15ML solution, Take 15 mLs (10 g total) by mouth 3 (three) times daily., Disp: 236 mL, Rfl: 0   lidocaine -prilocaine  (EMLA ) cream, Apply 1 Application topically as needed., Disp: 30 g, Rfl: 0   loratadine  (CLARITIN ) 10 MG tablet, Take 10 mg by mouth. Takes day of injection-Udenyca , post chemotherapy., Disp: , Rfl:    LORazepam  (ATIVAN ) 0.5 MG tablet, Place 1 tablet (0.5 mg total) under the tongue every 6 (six) hours as needed for anxiety., Disp: 60 tablet, Rfl: 0   methocarbamol  (ROBAXIN ) 500 MG tablet, Take 1 tablet (500 mg total) by mouth every 6 (six) hours as needed for muscle spasms., Disp: 30 tablet, Rfl: 0   oxyCODONE  (OXY IR/ROXICODONE ) 5 MG immediate release tablet, Take 1 tablet (5 mg total) by mouth every 6 (six) hours as needed for severe pain (pain score 7-10)., Disp: 60  tablet, Rfl: 0   polyethylene glycol (MIRALAX  / GLYCOLAX ) 17 g packet, Take 17 g by mouth daily as needed for mild constipation., Disp: 14 each, Rfl: 0   capecitabine  (XELODA ) 500 MG tablet, Take 5 tablets (2,500 mg total) by mouth 2 (two) times daily after a meal. Take within 30 minutes after meals. Take for 14 days on, then off for 7 days. Repeat every 21 days. (Patient not taking: Reported on 09/10/2023), Disp: 140 tablet, Rfl: 3   nicotine  (NICODERM CQ  - DOSED IN MG/24 HOURS) 21 mg/24hr patch, Place 1 patch (21 mg total) onto the skin daily. (Patient not taking: Reported on 07/12/2023), Disp: 30 patch, Rfl: 1   nystatin  (MYCOSTATIN ) 100000 UNIT/ML suspension, Take 5 mLs (500,000 Units total) by mouth 4 (four) times daily.  (Patient not taking: Reported on 07/12/2023), Disp: 473 mL, Rfl: 1  Allergies: No Known Allergies  Past Medical History, Surgical history, Social history, and Family History were reviewed and updated.  Review of Systems: Review of Systems  Constitutional: Negative.   HENT:  Negative.  Negative for hearing loss.   Eyes: Negative.  Negative for eye problems.  Respiratory: Negative.    Cardiovascular: Negative.   Gastrointestinal:  Positive for abdominal pain. Negative for constipation.  Endocrine: Negative.   Genitourinary: Negative.    Musculoskeletal: Negative.   Skin: Negative.   Neurological: Negative.        Numbness and tingling of hands and feet  Hematological: Negative.   Psychiatric/Behavioral: Negative.      Physical Exam: Vitals:   09/10/23 1103  BP: 121/76  Pulse: (!) 58  Resp: 18  Temp: 98.2 F (36.8 C)  SpO2: 97%   Wt Readings from Last 3 Encounters:  09/10/23 216 lb 12.8 oz (98.3 kg)  08/27/23 213 lb (96.6 kg)  07/26/23 212 lb (96.2 kg)    Physical Exam Vitals reviewed.  HENT:     Head: Normocephalic and atraumatic.  Eyes:     Pupils: Pupils are equal, round, and reactive to light.  Cardiovascular:     Rate and Rhythm: Normal rate and regular rhythm.     Heart sounds: Normal heart sounds.  Pulmonary:     Effort: Pulmonary effort is normal.     Breath sounds: Normal breath sounds.  Abdominal:     General: Bowel sounds are normal.     Palpations: Abdomen is soft.     Comments: Abdominal exam shows a well-healed laparotomy scar.  He has no fluid wave.  There is no guarding or rebound tenderness.    Musculoskeletal:        General: No tenderness or deformity. Normal range of motion.     Cervical back: Normal range of motion.  Lymphadenopathy:     Cervical: No cervical adenopathy.  Skin:    General: Skin is warm and dry.     Findings: No erythema or rash.  Neurological:     Mental Status: He is alert and oriented to person, place, and time.   Psychiatric:        Behavior: Behavior normal.        Thought Content: Thought content normal.        Judgment: Judgment normal.      Lab Results  Component Value Date   WBC 7.7 09/10/2023   HGB 15.2 09/10/2023   HCT 44.1 09/10/2023   MCV 90.4 09/10/2023   PLT 120 (L) 09/10/2023     Chemistry      Component Value Date/Time  NA 141 08/27/2023 0925   K 3.5 08/27/2023 0925   CL 104 08/27/2023 0925   CO2 29 08/27/2023 0925   BUN 14 08/27/2023 0925   CREATININE 1.08 08/27/2023 0925      Component Value Date/Time   CALCIUM  9.7 08/27/2023 0925   ALKPHOS 179 (H) 08/27/2023 0925   AST 35 08/27/2023 0925   ALT 44 08/27/2023 0925   BILITOT 0.4 08/27/2023 0925     No diagnosis found.  Impression and Plan: Mr. Gougeon is a very nice 48 year old white male.  He was found to have metastatic colon cancer.  We will go ahead and have him take the Avastin .  Again he will start his Xeloda  on Monday.  Would like to believe that this will be a good idea for him.  This was a good maintenance protocol for him.  I am not sure why he has issues with his left arm.  Again I cannot imagine this being malignancy.  I would think this is more arthritic than anything else.  However, we will have to go ahead and get a MRI of his cervical spine.  We will plan to get him back to see us  in another 3 weeks.    Maude JONELLE Crease, MD 1/10/202511:20 AM

## 2023-09-10 NOTE — Progress Notes (Signed)
 Patient is doing well after a positive scan last month. He will now transition to maintenance. He will start his Xeloda  on Monday. Avastin  will be given today.   Oncology Nurse Navigator Documentation     09/10/2023   10:30 AM  Oncology Nurse Navigator Flowsheets  Navigator Follow Up Date: 09/29/2023  Navigator Follow Up Reason: Follow-up Appointment;Chemotherapy  Navigator Location [facility]  Navigator Encounter Type Follow-up Appt  Patient Visit Type MedOnc  Treatment Phase Active Tx  Barriers/Navigation Needs Coordination of Care  Interventions None Required  Acuity Level 1-No Barriers  Time Spent with Patient 15

## 2023-09-10 NOTE — Progress Notes (Signed)
 Pt. in for first time treatment. Education completed and AVS reviewed.

## 2023-09-10 NOTE — Patient Instructions (Addendum)
 [facility] - A DEPT OF [facility]  Discharge Instructions: Thank you for choosing [facility] to provide your oncology and hematology care.   If you have a lab appointment with the Cancer Center, please go directly to the Cancer Center and check in at the registration area.  Wear comfortable clothing and clothing appropriate for easy access to any Portacath or PICC line.   We strive to give you quality time with your provider. You may need to reschedule your appointment if you arrive late (15 or more minutes).  Arriving late affects you and other patients whose appointments are after yours.  Also, if you miss three or more appointments without notifying the office, you may be dismissed from the clinic at the provider's discretion.      For prescription refill requests, have your pharmacy contact our office and allow 72 hours for refills to be completed.    Today you received the following chemotherapy and/or immunotherapy agents Bevacizumab -adcd    To help prevent nausea and vomiting after your treatment, we encourage you to take your nausea medication as directed.  BELOW ARE SYMPTOMS THAT SHOULD BE REPORTED IMMEDIATELY: *FEVER GREATER THAN 100.4 F (38 C) OR HIGHER *CHILLS OR SWEATING *NAUSEA AND VOMITING THAT IS NOT CONTROLLED WITH YOUR NAUSEA MEDICATION *UNUSUAL SHORTNESS OF BREATH *UNUSUAL BRUISING OR BLEEDING *URINARY PROBLEMS (pain or burning when urinating, or frequent urination) *BOWEL PROBLEMS (unusual diarrhea, constipation, pain near the anus) TENDERNESS IN MOUTH AND THROAT WITH OR WITHOUT PRESENCE OF ULCERS (sore throat, sores in mouth, or a toothache) UNUSUAL RASH, SWELLING OR PAIN  UNUSUAL VAGINAL DISCHARGE OR ITCHING   Items with * indicate a potential emergency and should be followed up as soon as possible or go to the Emergency Department if any problems should occur.  Please show the CHEMOTHERAPY ALERT CARD or  IMMUNOTHERAPY ALERT CARD at check-in to the Emergency Department and triage nurse. Should you have questions after your visit or need to cancel or reschedule your appointment, please contact [facility] - A DEPT OF [facility]  [phone] and follow the prompts.  Office hours are 8:00 a.m. to 4:30 p.m. Monday - Friday. Please note that voicemails left after 4:00 p.m. may not be returned until the following business day.  We are closed weekends and major holidays. You have access to a nurse at all times for urgent questions. Please call the main number to the clinic [phone] and follow the prompts.  For any non-urgent questions, you may also contact your provider using MyChart. We now offer e-Visits for anyone 3 and older to request care online for non-urgent symptoms. For details visit [email or web address].   Also download the MyChart app! Go to the app store, search MyChart, open the app, select [facility], and log in with your MyChart username and password.

## 2023-09-14 ENCOUNTER — Telehealth: Payer: Self-pay

## 2023-09-14 ENCOUNTER — Other Ambulatory Visit (HOSPITAL_COMMUNITY): Payer: Self-pay

## 2023-09-14 NOTE — Telephone Encounter (Signed)
 patient called and states he started his xeloda  yesterday and is experiencing increased heart rate and sweating today. consulted with Asberry Macintosh in pharmacy who recommend holding to see if symptoms resolved as this is not a typical side effect of this medication. Message sent to MD to see how he would like to proceed.

## 2023-09-15 ENCOUNTER — Telehealth: Payer: Self-pay

## 2023-09-15 ENCOUNTER — Ambulatory Visit (HOSPITAL_COMMUNITY)
Admission: RE | Admit: 2023-09-15 | Discharge: 2023-09-15 | Disposition: A | Discharge status: Home / Self Care | Payer: Medicaid Other | Source: Ambulatory Visit | Attending: Hematology & Oncology | Admitting: Hematology & Oncology

## 2023-09-15 DIAGNOSIS — M79602 Pain in left arm: Secondary | ICD-10-CM | POA: Diagnosis present

## 2023-09-15 DIAGNOSIS — C189 Malignant neoplasm of colon, unspecified: Secondary | ICD-10-CM | POA: Insufficient documentation

## 2023-09-15 DIAGNOSIS — C787 Secondary malignant neoplasm of liver and intrahepatic bile duct: Secondary | ICD-10-CM | POA: Insufficient documentation

## 2023-09-15 MED ORDER — GADOBUTROL 1 MMOL/ML IV SOLN
10.0000 mL | Freq: Once | INTRAVENOUS | Status: AC | PRN
  Administered 2023-09-15: 10 mL via INTRAVENOUS

## 2023-09-15 NOTE — Telephone Encounter (Signed)
 Called patient back and informed him per MD to hold Xeloda  for the rest of the week to see if symptoms improve. Left message for patient on VM.

## 2023-09-17 ENCOUNTER — Other Ambulatory Visit (HOSPITAL_BASED_OUTPATIENT_CLINIC_OR_DEPARTMENT_OTHER): Payer: Self-pay

## 2023-09-17 ENCOUNTER — Encounter: Payer: Self-pay | Admitting: *Deleted

## 2023-09-17 ENCOUNTER — Other Ambulatory Visit: Payer: Self-pay | Admitting: *Deleted

## 2023-09-17 ENCOUNTER — Telehealth: Payer: Self-pay

## 2023-09-17 DIAGNOSIS — G629 Polyneuropathy, unspecified: Secondary | ICD-10-CM

## 2023-09-17 DIAGNOSIS — C189 Malignant neoplasm of colon, unspecified: Secondary | ICD-10-CM

## 2023-09-17 MED ORDER — GABAPENTIN 300 MG PO CAPS
300.0000 mg | ORAL_CAPSULE | Freq: Three times a day (TID) | ORAL | 2 refills | Status: DC
  Filled 2023-09-17: qty 90, 30d supply, fill #0

## 2023-09-17 MED ORDER — OXYCODONE HCL 5 MG PO TABS
5.0000 mg | ORAL_TABLET | Freq: Four times a day (QID) | ORAL | 0 refills | Status: DC | PRN
  Filled 2023-09-17: qty 60, 15d supply, fill #0

## 2023-09-17 NOTE — Telephone Encounter (Signed)
Called patient to confirm he was holding xeloda, he has not started to hold it he has been taking it still. Patient notified to hold xeloda for 1 week starting today to see if symptoms resolve. Pt verbalized understanding and denies any other questions or concerns at this time.

## 2023-09-22 ENCOUNTER — Other Ambulatory Visit: Payer: Self-pay

## 2023-09-23 ENCOUNTER — Other Ambulatory Visit: Payer: Self-pay

## 2023-09-24 ENCOUNTER — Telehealth: Payer: Self-pay

## 2023-09-24 NOTE — Telephone Encounter (Signed)
Advised via MyChart.

## 2023-09-24 NOTE — Telephone Encounter (Signed)
-----   Message from Josph Macho sent at 09/24/2023  8:04 AM EST ----- Please call and let him know that the MRI of the neck is normal.  Nothing looks suspicious.

## 2023-09-27 ENCOUNTER — Telehealth: Payer: Self-pay

## 2023-09-27 ENCOUNTER — Other Ambulatory Visit: Payer: Self-pay

## 2023-09-27 NOTE — Telephone Encounter (Signed)
Received phone call from patient asking about his MRI results. Pt educated that the MRI is negative and a MyChart message was sent this AM with those results. Pt stated he has talked to different members of the staff regarding the pain he feels on his collarbone where is port is located. Pt asking if his body is rejecting the port or if it is infected. Pt denies any fever or redness to area.  Pt states the pain started months ago and just has gotten increasingly worse. Pt states it doesn't hurt more than last time it was evaluated by Dr. Myna Hidalgo. Pt asking if he could be allergic to the glue used to glue the port in place. Pt educated that the port is stitched in place. Pt offered to be seen today to have port evaluated and pt declined stating that he can have it evaluated when he comes to the office for his appointments in 2 days. Pt states he would like it removed and pt aware that he needs to discuss that with Dr. Myna Hidalgo. Pt verbalized understanding and had no further questions. Pt aware to call if any changes in his symptoms.

## 2023-09-28 ENCOUNTER — Other Ambulatory Visit: Payer: Self-pay

## 2023-09-29 ENCOUNTER — Other Ambulatory Visit: Payer: Self-pay

## 2023-09-29 ENCOUNTER — Inpatient Hospital Stay: Payer: Medicaid Other

## 2023-09-29 ENCOUNTER — Encounter: Payer: Self-pay | Admitting: *Deleted

## 2023-09-29 ENCOUNTER — Encounter: Payer: Self-pay | Admitting: Hematology & Oncology

## 2023-09-29 ENCOUNTER — Inpatient Hospital Stay (HOSPITAL_BASED_OUTPATIENT_CLINIC_OR_DEPARTMENT_OTHER): Payer: Medicaid Other | Admitting: Hematology & Oncology

## 2023-09-29 VITALS — BP 128/82 | HR 67 | Temp 98.0°F | Resp 17 | Ht 70.0 in | Wt 221.0 lb

## 2023-09-29 DIAGNOSIS — Z95828 Presence of other vascular implants and grafts: Secondary | ICD-10-CM

## 2023-09-29 DIAGNOSIS — C787 Secondary malignant neoplasm of liver and intrahepatic bile duct: Secondary | ICD-10-CM | POA: Diagnosis not present

## 2023-09-29 DIAGNOSIS — C189 Malignant neoplasm of colon, unspecified: Secondary | ICD-10-CM | POA: Diagnosis not present

## 2023-09-29 DIAGNOSIS — Z5112 Encounter for antineoplastic immunotherapy: Secondary | ICD-10-CM | POA: Diagnosis not present

## 2023-09-29 LAB — CBC WITH DIFFERENTIAL (CANCER CENTER ONLY)
Abs Immature Granulocytes: 0.02 10*3/uL (ref 0.00–0.07)
Basophils Absolute: 0 10*3/uL (ref 0.0–0.1)
Basophils Relative: 0 %
Eosinophils Absolute: 0.4 10*3/uL (ref 0.0–0.5)
Eosinophils Relative: 6 %
HCT: 46.3 % (ref 39.0–52.0)
Hemoglobin: 15.8 g/dL (ref 13.0–17.0)
Immature Granulocytes: 0 %
Lymphocytes Relative: 32 %
Lymphs Abs: 2.5 10*3/uL (ref 0.7–4.0)
MCH: 31.2 pg (ref 26.0–34.0)
MCHC: 34.1 g/dL (ref 30.0–36.0)
MCV: 91.5 fL (ref 80.0–100.0)
Monocytes Absolute: 0.7 10*3/uL (ref 0.1–1.0)
Monocytes Relative: 9 %
Neutro Abs: 4 10*3/uL (ref 1.7–7.7)
Neutrophils Relative %: 53 %
Platelet Count: 132 10*3/uL — ABNORMAL LOW (ref 150–400)
RBC: 5.06 MIL/uL (ref 4.22–5.81)
RDW: 13.7 % (ref 11.5–15.5)
WBC Count: 7.6 10*3/uL (ref 4.0–10.5)
nRBC: 0 % (ref 0.0–0.2)

## 2023-09-29 LAB — CMP (CANCER CENTER ONLY)
ALT: 67 U/L — ABNORMAL HIGH (ref 0–44)
AST: 45 U/L — ABNORMAL HIGH (ref 15–41)
Albumin: 4.3 g/dL (ref 3.5–5.0)
Alkaline Phosphatase: 168 U/L — ABNORMAL HIGH (ref 38–126)
Anion gap: 7 (ref 5–15)
BUN: 12 mg/dL (ref 6–20)
CO2: 29 mmol/L (ref 22–32)
Calcium: 9.6 mg/dL (ref 8.9–10.3)
Chloride: 103 mmol/L (ref 98–111)
Creatinine: 1 mg/dL (ref 0.61–1.24)
GFR, Estimated: >60 mL/min (ref >60)
Glucose, Bld: 91 mg/dL (ref 70–99)
Potassium: 3.9 mmol/L (ref 3.5–5.1)
Sodium: 139 mmol/L (ref 135–145)
Total Bilirubin: 0.5 mg/dL (ref 0.0–1.2)
Total Protein: 6.8 g/dL (ref 6.5–8.1)

## 2023-09-29 LAB — CEA (ACCESS): CEA (CHCC): 9.57 ng/mL — ABNORMAL HIGH (ref 0.00–5.00)

## 2023-09-29 MED ORDER — HEPARIN SOD (PORK) LOCK FLUSH 100 UNIT/ML IV SOLN
500.0000 [IU] | Freq: Once | INTRAVENOUS | Status: AC
Start: 2023-09-29 — End: 2023-09-29
  Administered 2023-09-29: 500 [IU] via INTRAVENOUS

## 2023-09-29 MED ORDER — SODIUM CHLORIDE 0.9% FLUSH
10.0000 mL | Freq: Once | INTRAVENOUS | Status: AC
Start: 2023-09-29 — End: 2023-09-29
  Administered 2023-09-29: 10 mL via INTRAVENOUS

## 2023-09-29 NOTE — Progress Notes (Signed)
Hematology and Oncology Follow Up Visit  Richard Bowman [id number] 07-08-1976 48 y.o. 09/29/2023  Principle Diagnosis:  Metastatic adenocarcinoma of the colon-liver metastasis -- pMMR/MSI-low -- (+) KRAS/ ERBB2(+)       Iron deficiency anemia Current Therapy:   FOLFOXIRI - s/p cycle #11  - start on  -- 12/09/2022 --oxaliplatin dropped after 04/19/2023 IV iron as indicated Xeloda/Avastin -- s/p cycle #1 on 09/13/2023 --Avastin on hold.     Interim History:  Richard Bowman is back for follow-up.  He comes in with all his complaints now.  Again, I am not sure exactly as to why he has all these problems.  It seemed to start after he got the Xeloda and Avastin.  He has had this pain issues.  He says he is sore.  He is having abdominal discomfort.  He has had no cough.  He says he is quite sore and painful where the Port-A-Cath crosses over the right clavicle.  Will ranges his last set of scans, he certainly has had a nice response.  His last CEA level was down to 10.9.  I suppose it is possible that the Xeloda and Avastin could be causing his issues.  He has had no problems with diarrhea.  He has had no mouth sores.  He has had no rashes.  He said that when he took the second dose of Xeloda each day, he was having problems with nausea.  He did not take anything for this.  He has had no fever.  He has had no obvious bleeding.  He has been urinating okay.  Overall, I would say that the performance status is probably ECOG 1.      Wt Readings from Last 3 Encounters:  09/29/23 221 lb (100.2 kg)  09/10/23 216 lb 12.8 oz (98.3 kg)  08/27/23 213 lb (96.6 kg)    Medications:  Current Outpatient Medications:    acetaminophen (TYLENOL) 500 MG tablet, Take 2 tablets (1,000 mg total) by mouth every 6 (six) hours as needed for mild pain., Disp: 30 tablet, Rfl: 0   docusate sodium (COLACE) 100 MG capsule, Take 1 capsule (100 mg total) by mouth 2 (two) times daily., Disp: 10 capsule, Rfl: 0    oxyCODONE (OXY IR/ROXICODONE) 5 MG immediate release tablet, Take 1 tablet (5 mg total) by mouth every 6 (six) hours as needed for severe pain (pain score 7-10)., Disp: 60 tablet, Rfl: 0   capecitabine (XELODA) 500 MG tablet, Take 5 tablets (2,500 mg total) by mouth 2 (two) times daily after a meal. Take within 30 minutes after meals. Take for 14 days on, then off for 7 days. Repeat every 21 days. (Patient not taking: Reported on 09/29/2023), Disp: 140 tablet, Rfl: 3   gabapentin (NEURONTIN) 300 MG capsule, Take 1 capsule (300 mg total) by mouth 3 (three) times daily. (Patient not taking: Reported on 09/29/2023), Disp: 90 capsule, Rfl: 2   lactose free nutrition (BOOST) LIQD, Take 237 mLs by mouth daily as needed (Supplement). (Patient not taking: Reported on 09/29/2023), Disp: , Rfl:    lactulose (CHRONULAC) 10 GM/15ML solution, Take 15 mLs (10 g total) by mouth 3 (three) times daily. (Patient not taking: Reported on 09/29/2023), Disp: 236 mL, Rfl: 0   lidocaine-prilocaine (EMLA) cream, Apply 1 Application topically as needed. (Patient not taking: Reported on 09/29/2023), Disp: 30 g, Rfl: 0   loratadine (CLARITIN) 10 MG tablet, Take 10 mg by mouth. Takes day of injection-Udenyca, post chemotherapy. (Patient not taking: Reported on  09/29/2023), Disp: , Rfl:    LORazepam (ATIVAN) 0.5 MG tablet, Place 1 tablet (0.5 mg total) under the tongue every 6 (six) hours as needed for anxiety. (Patient not taking: Reported on 09/29/2023), Disp: 60 tablet, Rfl: 0   methocarbamol (ROBAXIN) 500 MG tablet, Take 1 tablet (500 mg total) by mouth every 6 (six) hours as needed for muscle spasms. (Patient not taking: Reported on 09/29/2023), Disp: 30 tablet, Rfl: 0   nicotine (NICODERM CQ - DOSED IN MG/24 HOURS) 21 mg/24hr patch, Place 1 patch (21 mg total) onto the skin daily. (Patient not taking: Reported on 07/12/2023), Disp: 30 patch, Rfl: 1   nystatin (MYCOSTATIN) 100000 UNIT/ML suspension, Take 5 mLs (500,000 Units total) by  mouth 4 (four) times daily. (Patient not taking: Reported on 07/12/2023), Disp: 473 mL, Rfl: 1   polyethylene glycol (MIRALAX / GLYCOLAX) 17 g packet, Take 17 g by mouth daily as needed for mild constipation. (Patient not taking: Reported on 09/29/2023), Disp: 14 each, Rfl: 0  Allergies: No Known Allergies  Past Medical History, Surgical history, Social history, and Family History were reviewed and updated.  Review of Systems: Review of Systems  Constitutional: Negative.   HENT:  Negative.  Negative for hearing loss.   Eyes: Negative.  Negative for eye problems.  Respiratory: Negative.    Cardiovascular: Negative.   Gastrointestinal:  Positive for abdominal pain. Negative for constipation.  Endocrine: Negative.   Genitourinary: Negative.    Musculoskeletal: Negative.   Skin: Negative.   Neurological: Negative.        Numbness and tingling of hands and feet  Hematological: Negative.   Psychiatric/Behavioral: Negative.      Physical Exam: Vitals:   09/29/23 1049 09/29/23 1108  BP: 128/82 128/82  Pulse: 67 67  Resp: 17 17  Temp: 98 F (36.7 C) 98 F (36.7 C)  SpO2: 98% 98%   Wt Readings from Last 3 Encounters:  09/29/23 221 lb (100.2 kg)  09/10/23 216 lb 12.8 oz (98.3 kg)  08/27/23 213 lb (96.6 kg)    Physical Exam Vitals reviewed.  HENT:     Head: Normocephalic and atraumatic.  Eyes:     Pupils: Pupils are equal, round, and reactive to light.  Cardiovascular:     Rate and Rhythm: Normal rate and regular rhythm.     Heart sounds: Normal heart sounds.  Pulmonary:     Effort: Pulmonary effort is normal.     Breath sounds: Normal breath sounds.  Abdominal:     General: Bowel sounds are normal.     Palpations: Abdomen is soft.     Comments: Abdominal exam shows a well-healed laparotomy scar.  He has no fluid wave.  There is no guarding or rebound tenderness.    Musculoskeletal:        General: No tenderness or deformity. Normal range of motion.     Cervical back:  Normal range of motion.  Lymphadenopathy:     Cervical: No cervical adenopathy.  Skin:    General: Skin is warm and dry.     Findings: No erythema or rash.  Neurological:     Mental Status: He is alert and oriented to person, place, and time.  Psychiatric:        Behavior: Behavior normal.        Thought Content: Thought content normal.        Judgment: Judgment normal.     Lab Results  Component Value Date   WBC 7.6 09/29/2023   HGB  15.8 09/29/2023   HCT 46.3 09/29/2023   MCV 91.5 09/29/2023   PLT 132 (L) 09/29/2023     Chemistry      Component Value Date/Time   NA 139 09/29/2023 1020   K 3.9 09/29/2023 1020   CL 103 09/29/2023 1020   CO2 29 09/29/2023 1020   BUN 12 09/29/2023 1020   CREATININE 1.00 09/29/2023 1020      Component Value Date/Time   CALCIUM 9.6 09/29/2023 1020   ALKPHOS 168 (H) 09/29/2023 1020   AST 45 (H) 09/29/2023 1020   ALT 67 (H) 09/29/2023 1020   BILITOT 0.5 09/29/2023 1020      Impression and Plan: Richard Bowman is a very nice 48 year old white male.  He was found to have metastatic colon cancer.  We are going to hold his Avastin today.  I really do think he has to be on something.  I think what we can try to do is possibly having just do daily Xeloda.  Maybe, it is taking 5 Xeloda a day would suffice for right now.  If he tolerates this well, then maybe we can then try twice a day Xeloda.  I really do not think we need any scans.  He had scans that were just done.  I really would not think that he would have progressive disease.  We will have to see what his CEA level is.  I told him to restart the Xeloda on Monday.  Right now, we will plan to get him back in 3 weeks.  We will see how everything looks in 3 weeks.     Josph Macho, MD 1/29/202511:45 AM

## 2023-09-29 NOTE — Patient Instructions (Signed)
Implanted [facility] Guide An implanted port is a device that is placed under the skin. It is usually placed in the chest. The device may vary based on the need. Implanted ports can be used to give IV medicine, to take blood, or to give fluids. You may have an implanted port if: You need IV medicine that would be irritating to the small veins in your hands or arms. You need IV medicines, such as chemotherapy, for a long period of time. You need IV nutrition for a long period of time. You may have fewer limitations when using a port than you would if you used other types of long-term IVs. You will also likely be able to return to normal activities after your incision heals. An implanted port has two main parts: Reservoir. The reservoir is the part where a needle is inserted to give medicines or draw blood. The reservoir is round. After the port is placed, it appears as a small, raised area under your skin. Catheter. The catheter is a small, thin tube that connects the reservoir to a vein. Medicine that is inserted into the reservoir goes into the catheter and then into the vein. How is my port accessed? To access your port: A numbing cream may be placed on the skin over the port site. Your health care provider will put on a mask and sterile gloves. The skin over your port will be cleaned carefully with a germ-killing soap and allowed to dry. Your health care provider will gently pinch the port and insert a needle into it. Your health care provider will check for a blood return to make sure the port is in the vein and is still working (patent). If your port needs to remain accessed to get medicine continuously (constant infusion), your health care provider will place a clear bandage (dressing) over the needle site. The dressing and needle will need to be changed every week, or as told by your health care provider. What is flushing? Flushing helps keep the port working. Follow instructions from your  health care provider about how and when to flush the port. Ports are usually flushed with saline solution or a medicine called heparin. The need for flushing will depend on how the port is used: If the port is only used from time to time to give medicines or draw blood, the port may need to be flushed: Before and after medicines have been given. Before and after blood has been drawn. As part of routine maintenance. Flushing may be recommended every 4-6 weeks. If a constant infusion is running, the port may not need to be flushed. Throw away any syringes in a disposal container that is meant for sharp items (sharps container). You can buy a sharps container from a pharmacy, or you can make one by using an empty hard plastic bottle with a cover. How long will my port stay implanted? The port can stay in for as long as your health care provider thinks it is needed. When it is time for the port to come out, a surgery will be done to remove it. The surgery will be similar to the procedure that was done to put the port in. Follow these instructions at home: Caring for your port and port site Flush your port as told by your health care provider. If you need an infusion over several days, follow instructions from your health care provider about how to take care of your port site. Make sure you: Change your  dressing as told by your health care provider. Wash your hands with soap and water for at least 20 seconds before and after you change your dressing. If soap and water are not available, use alcohol-based hand sanitizer. Place any used dressings or infusion bags into a plastic bag. Throw that bag in the trash. Keep the dressing that covers the needle clean and dry. Do not get it wet. Do not use scissors or sharp objects near the infusion tubing. Keep any external tubes clamped, unless they are being used. Check your port site every day for signs of infection. Check for: Redness, swelling, or  pain. Fluid or blood. Warmth. Pus or a bad smell. Protect the skin around the port site. Avoid wearing bra straps that rub or irritate the site. Protect the skin around your port from seat belts. Place a soft pad over your chest if needed. Bathe or shower as told by your health care provider. The site may get wet as long as you are not actively receiving an infusion. General instructions  Return to your normal activities as told by your health care provider. Ask your health care provider what activities are safe for you. Carry a medical alert card or wear a medical alert bracelet at all times. This will let health care providers know that you have an implanted port in case of an emergency. Where to find more information American Cancer Society: www.cancer.org American Society of Clinical Oncology: www.cancer.net Contact a health care provider if: You have a fever or chills. You have redness, swelling, or pain at the port site. You have fluid or blood coming from your port site. Your incision feels warm to the touch. You have pus or a bad smell coming from the port site. Summary Implanted ports are usually placed in the chest for long-term IV access. Follow instructions from your health care provider about flushing the port and changing bandages (dressings). Take care of the area around your port by avoiding clothing that puts pressure on the area, and by watching for signs of infection. Protect the skin around your port from seat belts. Place a soft pad over your chest if needed. Contact a health care provider if you have a fever or you have redness, swelling, pain, fluid, or a bad smell at the port site. This information is not intended to replace advice given to you by your health care provider. Make sure you discuss any questions you have with your health care provider. Document Revised: 02/18/2021 Document Reviewed: 02/18/2021 Elsevier Patient Education  2024 [employer].

## 2023-09-29 NOTE — Progress Notes (Signed)
Patient hasn't been feeling well since starting his new treatment. He's had more pain and nausea. Dr Myna Hidalgo will hold the Avastin and dose reduce the Xeloda. He will reassess patient in 3 weeks.   Oncology Nurse Navigator Documentation     09/29/2023   10:45 AM  Oncology Nurse Navigator Flowsheets  Navigator Follow Up Date: 10/20/2023  Navigator Follow Up Reason: Follow-up Appointment  Navigator Location [facility]  Navigator Encounter Type Follow-up Appt  Patient Visit Type MedOnc  Treatment Phase Active Tx  Barriers/Navigation Needs Coordination of Care  Interventions None Required  Acuity Level 1-No Barriers  Time Spent with Patient 15

## 2023-09-29 NOTE — Progress Notes (Signed)
Specialty Pharmacy Ongoing Clinical Assessment Note  Richard Bowman is a 48 y.o. male who is being followed by the specialty pharmacy service for RxSp Oncology   Patient's specialty medication(s) reviewed today: Capecitabine (XELODA)   Missed doses in the last 4 weeks: 0   Patient/Caregiver did not have any additional questions or concerns.   Therapeutic benefit summary: Patient is achieving benefit   Adverse events/side effects summary: No adverse events/side effects   Patient's therapy is appropriate to: Continue  Cycle restarts 10/04/2023    Goals Addressed             This Visit's Progress    Slow Disease Progression       Patient is on track. Patient will maintain adherence         Follow up:  3 months  Altheria [facility] Specialty Pharmacist

## 2023-09-29 NOTE — Progress Notes (Signed)
Specialty Pharmacy Refill Coordination Note  Richard Bowman is a 48 y.o. male contacted today regarding refills of specialty medication(s) Capecitabine (XELODA)   Patient requested Daryll Drown at [facility] [place] date: 10/01/23   Medication will be filled on 09/30/2023.  Next cycle starts 10/04/2023

## 2023-09-29 NOTE — Addendum Note (Signed)
Addended by: Cooper Render on: 09/29/2023 12:54 PM   Modules accepted: Orders

## 2023-09-30 ENCOUNTER — Other Ambulatory Visit: Payer: Self-pay

## 2023-10-01 ENCOUNTER — Other Ambulatory Visit: Payer: Self-pay

## 2023-10-04 ENCOUNTER — Telehealth: Payer: Self-pay

## 2023-10-04 ENCOUNTER — Encounter: Payer: Self-pay | Admitting: Hematology & Oncology

## 2023-10-04 MED ORDER — PANTOPRAZOLE SODIUM 40 MG PO TBEC: 40.0000 mg | DELAYED_RELEASE_TABLET | Freq: Two times a day (BID) | ORAL | 4 refills | Status: DC

## 2023-10-04 NOTE — Telephone Encounter (Addendum)
Patient left a message on our answering machine stating that the Xeloda is giving him chest pain. Dr Myna Hidalgo informed. Called patient back and told him that Dr. Myna Hidalgo wants him to stop the Xeloda and that he also prescribed him Protonix, just in the case that the chest pain can be related to heartburn. Patient verbalized understanding. He has no other questions at this time.We will call back and check on him tomorrow.

## 2023-10-04 NOTE — Addendum Note (Signed)
Addended by: Josph Macho on: 10/04/2023 03:46 PM   Modules accepted: Orders

## 2023-10-05 ENCOUNTER — Other Ambulatory Visit (HOSPITAL_COMMUNITY): Payer: Self-pay

## 2023-10-05 ENCOUNTER — Other Ambulatory Visit: Payer: Self-pay

## 2023-10-05 ENCOUNTER — Telehealth: Payer: Self-pay

## 2023-10-05 NOTE — Telephone Encounter (Signed)
 Called patient to follow up. When asked how he feels, he said  All right , I guess, I slept all day. He stopped taking Xeloda , and denied having any more chest pain. He will start taking the Protonix . He does not have any questions at this time. I did apologize for waking him up and advise him to call if will have any other concerns.

## 2023-10-08 ENCOUNTER — Other Ambulatory Visit: Payer: Self-pay

## 2023-10-08 ENCOUNTER — Other Ambulatory Visit (HOSPITAL_BASED_OUTPATIENT_CLINIC_OR_DEPARTMENT_OTHER): Payer: Self-pay

## 2023-10-08 DIAGNOSIS — C189 Malignant neoplasm of colon, unspecified: Secondary | ICD-10-CM

## 2023-10-08 MED ORDER — OXYCODONE HCL 5 MG PO TABS
5.0000 mg | ORAL_TABLET | Freq: Four times a day (QID) | ORAL | 0 refills | Status: DC | PRN
  Filled 2023-10-08: qty 60, 15d supply, fill #0

## 2023-10-13 ENCOUNTER — Other Ambulatory Visit (HOSPITAL_COMMUNITY): Payer: Self-pay

## 2023-10-20 ENCOUNTER — Inpatient Hospital Stay: Payer: Medicaid Other | Attending: Hematology & Oncology

## 2023-10-20 ENCOUNTER — Inpatient Hospital Stay (HOSPITAL_BASED_OUTPATIENT_CLINIC_OR_DEPARTMENT_OTHER): Payer: Medicaid Other | Admitting: Hematology & Oncology

## 2023-10-20 ENCOUNTER — Encounter: Payer: Self-pay | Admitting: Hematology & Oncology

## 2023-10-20 ENCOUNTER — Inpatient Hospital Stay: Payer: Medicaid Other

## 2023-10-20 ENCOUNTER — Encounter: Payer: Self-pay | Admitting: *Deleted

## 2023-10-20 VITALS — BP 139/86 | HR 61 | Temp 98.0°F | Resp 18 | Ht 70.0 in | Wt 220.0 lb

## 2023-10-20 DIAGNOSIS — C787 Secondary malignant neoplasm of liver and intrahepatic bile duct: Secondary | ICD-10-CM

## 2023-10-20 DIAGNOSIS — H9311 Tinnitus, right ear: Secondary | ICD-10-CM | POA: Diagnosis not present

## 2023-10-20 DIAGNOSIS — C187 Malignant neoplasm of sigmoid colon: Secondary | ICD-10-CM | POA: Insufficient documentation

## 2023-10-20 DIAGNOSIS — D509 Iron deficiency anemia, unspecified: Secondary | ICD-10-CM | POA: Insufficient documentation

## 2023-10-20 DIAGNOSIS — C189 Malignant neoplasm of colon, unspecified: Secondary | ICD-10-CM

## 2023-10-20 DIAGNOSIS — Z79899 Other long term (current) drug therapy: Secondary | ICD-10-CM | POA: Diagnosis not present

## 2023-10-20 LAB — CBC WITH DIFFERENTIAL (CANCER CENTER ONLY)
Abs Immature Granulocytes: 0.09 10*3/uL — ABNORMAL HIGH (ref 0.00–0.07)
Basophils Absolute: 0 10*3/uL (ref 0.0–0.1)
Basophils Relative: 0 %
Eosinophils Absolute: 0.3 10*3/uL (ref 0.0–0.5)
Eosinophils Relative: 4 %
HCT: 48.2 % (ref 39.0–52.0)
Hemoglobin: 16.5 g/dL (ref 13.0–17.0)
Immature Granulocytes: 1 %
Lymphocytes Relative: 27 %
Lymphs Abs: 2.2 10*3/uL (ref 0.7–4.0)
MCH: 30.7 pg (ref 26.0–34.0)
MCHC: 34.2 g/dL (ref 30.0–36.0)
MCV: 89.8 fL (ref 80.0–100.0)
Monocytes Absolute: 0.6 10*3/uL (ref 0.1–1.0)
Monocytes Relative: 8 %
Neutro Abs: 4.8 10*3/uL (ref 1.7–7.7)
Neutrophils Relative %: 60 %
Platelet Count: 152 10*3/uL (ref 150–400)
RBC: 5.37 MIL/uL (ref 4.22–5.81)
RDW: 13.1 % (ref 11.5–15.5)
WBC Count: 8.1 10*3/uL (ref 4.0–10.5)
nRBC: 0 % (ref 0.0–0.2)

## 2023-10-20 LAB — CMP (CANCER CENTER ONLY)
ALT: 58 U/L — ABNORMAL HIGH (ref 0–44)
AST: 52 U/L — ABNORMAL HIGH (ref 15–41)
Albumin: 4.2 g/dL (ref 3.5–5.0)
Alkaline Phosphatase: 194 U/L — ABNORMAL HIGH (ref 38–126)
Anion gap: 8 (ref 5–15)
BUN: 17 mg/dL (ref 6–20)
CO2: 27 mmol/L (ref 22–32)
Calcium: 10.1 mg/dL (ref 8.9–10.3)
Chloride: 104 mmol/L (ref 98–111)
Creatinine: 1.04 mg/dL (ref 0.61–1.24)
GFR, Estimated: >60 mL/min (ref >60)
Glucose, Bld: 134 mg/dL — ABNORMAL HIGH (ref 70–99)
Potassium: 3.7 mmol/L (ref 3.5–5.1)
Sodium: 139 mmol/L (ref 135–145)
Total Bilirubin: 0.6 mg/dL (ref 0.0–1.2)
Total Protein: 7.1 g/dL (ref 6.5–8.1)

## 2023-10-20 LAB — FERRITIN: Ferritin: 152 ng/mL (ref 24–336)

## 2023-10-20 LAB — RETICULOCYTES
Immature Retic Fract: 7.7 % (ref 2.3–15.9)
RBC.: 5.37 MIL/uL (ref 4.22–5.81)
Retic Count, Absolute: 58 10*3/uL (ref 19.0–186.0)
Retic Ct Pct: 1.1 % (ref 0.4–3.1)

## 2023-10-20 LAB — IRON AND IRON BINDING CAPACITY (CC-WL,HP ONLY)
Iron: 110 ug/dL (ref 45–182)
Saturation Ratios: 32 % (ref 17.9–39.5)
TIBC: 344 ug/dL (ref 250–450)
UIBC: 234 ug/dL (ref 117–376)

## 2023-10-20 MED ORDER — HEPARIN SOD (PORK) LOCK FLUSH 100 UNIT/ML IV SOLN
500.0000 [IU] | Freq: Once | INTRAVENOUS | Status: DC
  Administered 2023-10-20: 500 [IU] via INTRAVENOUS

## 2023-10-20 MED ORDER — SODIUM CHLORIDE 0.9% FLUSH
10.0000 mL | Freq: Once | INTRAVENOUS | Status: AC
  Administered 2023-10-20: 10 mL via INTRAVENOUS

## 2023-10-20 MED ORDER — SODIUM CHLORIDE 0.9% FLUSH: 10.0000 mL | INTRAVENOUS | Status: DC | PRN

## 2023-10-20 NOTE — Addendum Note (Signed)
Addended by: Cooper Render on: 10/20/2023 12:03 PM   Modules accepted: Orders

## 2023-10-20 NOTE — Progress Notes (Signed)
Hematology and Oncology Follow Up Visit  Richard Bowman [id number] 1975-11-14 48 y.o. 10/20/2023  Principle Diagnosis:  Metastatic adenocarcinoma of the colon-liver metastasis -- pMMR/MSI-low -- (+) KRAS/ ERBB2(+)       Iron deficiency anemia Current Therapy:   FOLFOXIRI - s/p cycle #11  - start on  -- 12/09/2022 --oxaliplatin dropped after 04/19/2023 IV iron as indicated Xeloda/Avastin -- s/p cycle #1 on 09/13/2023 --Avastin on hold.  -Xeloda d/c'ed on 10/15/2023     Interim History:  Mr. Richard Bowman is back for follow-up.  His biggest complaint is the pain with the Port-A-Cath.  We will have to see about getting the Port-A-Cath taken out.  I do not think we are going to use it right now.  He cannot tolerate the Xeloda.  Given the daily dose of Xeloda he had a hard time with.  He has been on treatment now for close to a year.  I think it is time for him to have a "vacation".  I really think that we should consider intrahepatic therapy for him.  His disease, for the most part, is confined to the liver.  He has had a very nice response to treatment.  I think that intrahepatic therapy would be reasonable.  I made a ambulatory referral to Interventional Radiology.  He has some ringing in the ears.  Again I am not sure as to why he would have ringing in the ears.  Typically, the chemotherapy does not cause this.  We will have to see about getting him to audiology.  I put a referral in for Audiology to see him.  Thankfully, his tumor marker has come down nicely.  His last CEA was down to 9.6.  He is eating okay.  He is having no problems with bowels or bladder.  He is having no problems with nausea or vomiting.  He has had no problems with rashes.  There is been no bleeding.  He has had no leg swelling.  Overall, I would say that his performance status is probably ECOG 1.   Wt Readings from Last 3 Encounters:  10/20/23 220 lb (99.8 kg)  09/29/23 221 lb (100.2 kg)  09/10/23 216 lb 12.8 oz  (98.3 kg)    Medications:  Current Outpatient Medications:    acetaminophen (TYLENOL) 500 MG tablet, Take 2 tablets (1,000 mg total) by mouth every 6 (six) hours as needed for mild pain., Disp: 30 tablet, Rfl: 0   gabapentin (NEURONTIN) 300 MG capsule, Take 1 capsule (300 mg total) by mouth 3 (three) times daily., Disp: 90 capsule, Rfl: 2   lactose free nutrition (BOOST) LIQD, Take 237 mLs by mouth daily as needed (Supplement)., Disp: , Rfl:    oxyCODONE (OXY IR/ROXICODONE) 5 MG immediate release tablet, Take 1 tablet (5 mg total) by mouth every 6 (six) hours as needed for severe pain (pain score 7-10)., Disp: 60 tablet, Rfl: 0   pantoprazole (PROTONIX) 40 MG tablet, Take 1 tablet (40 mg total) by mouth 2 (two) times daily., Disp: 60 tablet, Rfl: 4   docusate sodium (COLACE) 100 MG capsule, Take 1 capsule (100 mg total) by mouth 2 (two) times daily. (Patient not taking: Reported on 10/20/2023), Disp: 10 capsule, Rfl: 0   lactulose (CHRONULAC) 10 GM/15ML solution, Take 15 mLs (10 g total) by mouth 3 (three) times daily. (Patient not taking: Reported on 09/29/2023), Disp: 236 mL, Rfl: 0   lidocaine-prilocaine (EMLA) cream, Apply 1 Application topically as needed. (Patient not taking: Reported on 10/20/2023),  Disp: 30 g, Rfl: 0   LORazepam (ATIVAN) 0.5 MG tablet, Place 1 tablet (0.5 mg total) under the tongue every 6 (six) hours as needed for anxiety. (Patient not taking: Reported on 10/20/2023), Disp: 60 tablet, Rfl: 0   methocarbamol (ROBAXIN) 500 MG tablet, Take 1 tablet (500 mg total) by mouth every 6 (six) hours as needed for muscle spasms. (Patient not taking: Reported on 10/20/2023), Disp: 30 tablet, Rfl: 0   nicotine (NICODERM CQ - DOSED IN MG/24 HOURS) 21 mg/24hr patch, Place 1 patch (21 mg total) onto the skin daily. (Patient not taking: Reported on 07/12/2023), Disp: 30 patch, Rfl: 1   nystatin (MYCOSTATIN) 100000 UNIT/ML suspension, Take 5 mLs (500,000 Units total) by mouth 4 (four) times daily.  (Patient not taking: Reported on 07/12/2023), Disp: 473 mL, Rfl: 1   polyethylene glycol (MIRALAX / GLYCOLAX) 17 g packet, Take 17 g by mouth daily as needed for mild constipation. (Patient not taking: Reported on 09/29/2023), Disp: 14 each, Rfl: 0  Allergies: No Known Allergies  Past Medical History, Surgical history, Social history, and Family History were reviewed and updated.  Review of Systems: Review of Systems  Constitutional: Negative.   HENT:  Negative.  Negative for hearing loss.   Eyes: Negative.  Negative for eye problems.  Respiratory: Negative.    Cardiovascular: Negative.   Gastrointestinal:  Positive for abdominal pain. Negative for constipation.  Endocrine: Negative.   Genitourinary: Negative.    Musculoskeletal: Negative.   Skin: Negative.   Neurological: Negative.        Numbness and tingling of hands and feet  Hematological: Negative.   Psychiatric/Behavioral: Negative.      Physical Exam: Vitals:   10/20/23 1000  BP: 139/86  Pulse: 61  Resp: 18  Temp: 98 F (36.7 C)  SpO2: 98%   Wt Readings from Last 3 Encounters:  10/20/23 220 lb (99.8 kg)  09/29/23 221 lb (100.2 kg)  09/10/23 216 lb 12.8 oz (98.3 kg)    Physical Exam Vitals reviewed.  HENT:     Head: Normocephalic and atraumatic.  Eyes:     Pupils: Pupils are equal, round, and reactive to light.  Cardiovascular:     Rate and Rhythm: Normal rate and regular rhythm.     Heart sounds: Normal heart sounds.  Pulmonary:     Effort: Pulmonary effort is normal.     Breath sounds: Normal breath sounds.  Abdominal:     General: Bowel sounds are normal.     Palpations: Abdomen is soft.     Comments: Abdominal exam shows a well-healed laparotomy scar.  He has no fluid wave.  There is no guarding or rebound tenderness.    Musculoskeletal:        General: No tenderness or deformity. Normal range of motion.     Cervical back: Normal range of motion.  Lymphadenopathy:     Cervical: No cervical  adenopathy.  Skin:    General: Skin is warm and dry.     Findings: No erythema or rash.  Neurological:     Mental Status: He is alert and oriented to person, place, and time.  Psychiatric:        Behavior: Behavior normal.        Thought Content: Thought content normal.        Judgment: Judgment normal.     Lab Results  Component Value Date   WBC 8.1 10/20/2023   HGB 16.5 10/20/2023   HCT 48.2 10/20/2023   MCV  89.8 10/20/2023   PLT 152 10/20/2023     Chemistry      Component Value Date/Time   NA 139 10/20/2023 1020   K 3.7 10/20/2023 1020   CL 104 10/20/2023 1020   CO2 27 10/20/2023 1020   BUN 17 10/20/2023 1020   CREATININE 1.04 10/20/2023 1020      Component Value Date/Time   CALCIUM 10.1 10/20/2023 1020   ALKPHOS 194 (H) 10/20/2023 1020   AST 52 (H) 10/20/2023 1020   ALT 58 (H) 10/20/2023 1020   BILITOT 0.6 10/20/2023 1020      Impression and Plan: Mr. Richard Bowman is a very nice 48 year old white male.  He was found to have metastatic colon cancer.  Right now, I will give him a "vacation" from systemic therapy.  I think this would really help him out.  We will see if he does not qualify for intrahepatic therapy.  Again I made a referral to Interventional Radiology.  Will see about getting his Port-A-Cath taken out.  This will hopefully help with some of the discomfort that he has.  We will continue to follow him along.  We will see about getting a CT scan on him to see exactly where everything stands right now.  I think this would be reasonable.  I would probably get him back to see [place] in another month or so.  Hopefully, he will be able to get back to the quality of life that he would like.      Josph Macho, MD 2/19/202511:22 AM

## 2023-10-20 NOTE — Patient Instructions (Signed)

## 2023-10-21 ENCOUNTER — Encounter: Payer: Self-pay | Admitting: Hematology & Oncology

## 2023-10-21 NOTE — Progress Notes (Signed)
Patient did not tolerate Xeloda. He also continues to c/o pain at the site of his port. Dr Myna Hidalgo will start patient on a treatment holiday and have his port removed.   Dr Myna Hidalgo would like patient assessed by IR for possible intrahepatic therapy. Order placed.  Oncology Nurse Navigator Documentation     10/20/2023   10:45 AM  Oncology Nurse Navigator Flowsheets  Navigator Follow Up Date: 10/25/2023  Navigator Follow Up Reason: Scan Review  Navigator Location [facility]  Navigator Encounter Type Appt/Treatment Plan Review  Patient Visit Type MedOnc  Treatment Phase Active Tx  Barriers/Navigation Needs Coordination of Care  Interventions Referrals  Acuity Level 1-No Barriers  Referrals Other  Time Spent with Patient 15

## 2023-10-22 ENCOUNTER — Other Ambulatory Visit: Payer: Self-pay

## 2023-10-25 ENCOUNTER — Encounter (HOSPITAL_BASED_OUTPATIENT_CLINIC_OR_DEPARTMENT_OTHER): Payer: Self-pay

## 2023-10-25 ENCOUNTER — Ambulatory Visit (HOSPITAL_BASED_OUTPATIENT_CLINIC_OR_DEPARTMENT_OTHER)
Admission: RE | Admit: 2023-10-25 | Discharge: 2023-10-25 | Disposition: A | Discharge status: Home / Self Care | Payer: Medicaid Other | Source: Ambulatory Visit | Attending: Hematology & Oncology | Admitting: Hematology & Oncology

## 2023-10-25 ENCOUNTER — Other Ambulatory Visit (HOSPITAL_BASED_OUTPATIENT_CLINIC_OR_DEPARTMENT_OTHER): Payer: Self-pay

## 2023-10-25 ENCOUNTER — Other Ambulatory Visit: Payer: Self-pay | Admitting: *Deleted

## 2023-10-25 DIAGNOSIS — C787 Secondary malignant neoplasm of liver and intrahepatic bile duct: Secondary | ICD-10-CM | POA: Insufficient documentation

## 2023-10-25 DIAGNOSIS — C189 Malignant neoplasm of colon, unspecified: Secondary | ICD-10-CM

## 2023-10-25 MED ORDER — OXYCODONE HCL 5 MG PO TABS
5.0000 mg | ORAL_TABLET | Freq: Four times a day (QID) | ORAL | 0 refills | Status: DC | PRN
  Filled 2023-10-25: qty 60, 15d supply, fill #0

## 2023-10-25 MED ORDER — IOHEXOL 300 MG/ML  SOLN
100.0000 mL | Freq: Once | INTRAMUSCULAR | Status: AC | PRN
  Administered 2023-10-25: 100 mL via INTRAVENOUS

## 2023-10-26 ENCOUNTER — Encounter: Payer: Self-pay | Admitting: *Deleted

## 2023-10-26 NOTE — Progress Notes (Signed)
 Reviewed scan with Dr Myna Hidalgo. Questionable progressive disease noted. Plan for treatment vacation may need to be reconsidered. Dr Myna Hidalgo will follow up with patient.   Oncology Nurse Navigator Documentation     10/26/2023    9:45 AM  Oncology Nurse Navigator Flowsheets  Navigator Follow Up Date: 11/19/2023  Navigator Follow Up Reason: Follow-up Appointment  Navigator Location [facility]  Navigator Encounter Type Scan Review  Patient Visit Type MedOnc  Treatment Phase Active Tx  Barriers/Navigation Needs Coordination of Care  Interventions Other  Acuity Level 1-No Barriers  Time Spent with Patient 15

## 2023-10-27 ENCOUNTER — Telehealth: Payer: Self-pay

## 2023-10-27 NOTE — Telephone Encounter (Signed)
 Received VM from pt requesting CT scan results. Per Dr Myna Hidalgo, he will call patient with results. dph

## 2023-10-28 ENCOUNTER — Other Ambulatory Visit: Payer: Self-pay

## 2023-10-28 ENCOUNTER — Ambulatory Visit: Payer: Medicaid Other | Attending: Hematology & Oncology | Admitting: Audiologist

## 2023-10-28 ENCOUNTER — Ambulatory Visit (HOSPITAL_COMMUNITY)
Admission: RE | Admit: 2023-10-28 | Discharge: 2023-10-28 | Disposition: A | Discharge status: Home / Self Care | Payer: Medicaid Other | Source: Ambulatory Visit | Attending: Hematology & Oncology | Admitting: Hematology & Oncology

## 2023-10-28 DIAGNOSIS — C787 Secondary malignant neoplasm of liver and intrahepatic bile duct: Secondary | ICD-10-CM | POA: Diagnosis not present

## 2023-10-28 DIAGNOSIS — H9311 Tinnitus, right ear: Secondary | ICD-10-CM | POA: Insufficient documentation

## 2023-10-28 DIAGNOSIS — Z452 Encounter for adjustment and management of vascular access device: Secondary | ICD-10-CM | POA: Diagnosis present

## 2023-10-28 DIAGNOSIS — C189 Malignant neoplasm of colon, unspecified: Secondary | ICD-10-CM | POA: Insufficient documentation

## 2023-10-28 DIAGNOSIS — Z9221 Personal history of antineoplastic chemotherapy: Secondary | ICD-10-CM | POA: Diagnosis present

## 2023-10-28 DIAGNOSIS — H9011 Conductive hearing loss, unilateral, right ear, with unrestricted hearing on the contralateral side: Secondary | ICD-10-CM | POA: Insufficient documentation

## 2023-10-28 HISTORY — PX: IR REMOVAL TUN ACCESS W/ PORT W/O FL MOD SED: IMG2290

## 2023-10-28 MED ORDER — LIDOCAINE-EPINEPHRINE 1 %-1:100000 IJ SOLN
20.0000 mL | Freq: Once | INTRAMUSCULAR | Status: AC
  Administered 2023-10-28: 10 mL via INTRADERMAL

## 2023-10-28 MED ORDER — LIDOCAINE-EPINEPHRINE 1 %-1:100000 IJ SOLN
INTRAMUSCULAR | Status: AC
  Filled 2023-10-28: qty 1

## 2023-10-28 NOTE — Procedures (Signed)
 Interventional Radiology Procedure Note  Procedure: Removal of a right IJ approach single lumen PowerPort.    Complications: None  Recommendations:  - Ok to shower tomorrow - Do not submerge for 7 days - Routine wound care   Signed,  Yvone Neu. Loreta Ave, DO, ABVM, RPVI

## 2023-10-28 NOTE — Procedures (Signed)
  Outpatient Audiology and [facility] [address] [place], [place]  [address] [phone]  AUDIOLOGICAL  EVALUATION  NAME: Richard Bowman     DOB:   [birth year]      MRN: [id number]                                                                                     DATE: 10/28/2023     REFERENT: Rema Fendt, NP STATUS: Outpatient DIAGNOSIS: Conductive Hearing Loss Right Ear, Tinnitus Right Ear, Ototoxicity    History: Richard Bowman was seen for an audiological evaluation due to a sudden onset of bothersome tinnitus in the right ear and muffled hearing about a month ago.  He thinks the hearing loss started more gradually but he is not sure.  He is currently in treatment for colon cancer.  He recently had his port removed due to complications with chemotherapy.  He knows that before he started chemotherapy he did not have tinnitus or hearing loss.  He denies any pain or pressure in either ear.  He has no history of severe hazardous noise exposure.  He denies any illness on the time of hearing loss onset.  The tinnitus in the right ear sounds like a ring and changes in pitch.  Every now and then he will also hear ringing in the left ear but it is not as bothersome nor is loud.  He has not previously had a hearing test.   Evaluation:  Otoscopy showed a clear view of the tympanic membranes, bilaterally Tympanometry results were consistent with normal middle ear function bilaterally Audiometric testing was completed using Conventional Audiometry techniques with insert earphones and supraural headphones. Test results are consistent with normal hearing in the left ear except for a mild hearing loss at 8 kHz only.  Right ear has a moderately severe flat conductive hearing loss with a sensorineural component at 500 Hz only.  See audiogram below. Speech Recognition Thresholds were obtained at 60 dB HL in the right ear and at 15 dB HL in the left ear. Word Recognition Testing was completed at  90 dB masked in the right ear and 55 dB in the left. [name] scored 100% in each ear.  Results:  The test results were reviewed with [name]. He has essentially normal left ear hearing. His right ear has a moderately severe conductive hearing loss. He had relatively normal eardrum movement in the right ear, so there is no indication of fluid. This type of hearing loss warrants follow up with Otolaryngology and monitoring.  Audiogram printed and provided to [name].     Recommendations:  Recommend referral to Otolaryngology due to moderately severe conductive unilateral hearing loss, and asymmetric tinnitus.   After seeing Otolaryngology recommend follow up hearing testing either with Otolaryngology office or [facility].    28 minutes spent testing and counseling on results.   If you have any questions please feel free to contact me at [phone].  Ammie Ferrier Au.D.  Audiologist   10/28/2023  3:26 PM  Cc: Rema Fendt, NP

## 2023-10-29 ENCOUNTER — Other Ambulatory Visit: Payer: Self-pay | Admitting: Hematology & Oncology

## 2023-10-29 DIAGNOSIS — C189 Malignant neoplasm of colon, unspecified: Secondary | ICD-10-CM

## 2023-11-01 ENCOUNTER — Other Ambulatory Visit: Payer: Self-pay | Admitting: Hematology & Oncology

## 2023-11-02 ENCOUNTER — Inpatient Hospital Stay
Admission: RE | Admit: 2023-11-02 | Discharge: 2023-11-02 | Disposition: A | Discharge status: Home / Self Care | Payer: Medicaid Other | Source: Ambulatory Visit | Attending: Hematology & Oncology | Admitting: Hematology & Oncology

## 2023-11-02 ENCOUNTER — Other Ambulatory Visit: Payer: Medicaid Other

## 2023-11-02 ENCOUNTER — Telehealth: Payer: Self-pay

## 2023-11-02 NOTE — Telephone Encounter (Signed)
 Patient called asking if he needed to be on a blood thinner currently after his call with dr.ennever last week and if his MRI needed to be a full body MRI? Patient also stated he missed his appt with IR and wants to know if he should wait to reschedule.  Called patient back after talking with MD, patient informed only MRI ov liver is needed right now, Dr.Ennever will see if he needs a blood thinner after his MRI results and to wait on rescheduling with IR until after his MRI. Pt verbalized understanding and denies any other questions or concerns at this time.

## 2023-11-04 ENCOUNTER — Ambulatory Visit (HOSPITAL_COMMUNITY)
Admission: RE | Admit: 2023-11-04 | Discharge: 2023-11-04 | Disposition: A | Discharge status: Home / Self Care | Source: Ambulatory Visit | Attending: Hematology & Oncology | Admitting: Hematology & Oncology

## 2023-11-04 ENCOUNTER — Encounter: Payer: Self-pay | Admitting: *Deleted

## 2023-11-04 DIAGNOSIS — C787 Secondary malignant neoplasm of liver and intrahepatic bile duct: Secondary | ICD-10-CM | POA: Diagnosis present

## 2023-11-04 DIAGNOSIS — C189 Malignant neoplasm of colon, unspecified: Secondary | ICD-10-CM | POA: Diagnosis present

## 2023-11-04 MED ORDER — GADOBUTROL 1 MMOL/ML IV SOLN
9.0000 mL | Freq: Once | INTRAVENOUS | Status: AC | PRN
  Administered 2023-11-04: 9 mL via INTRAVENOUS

## 2023-11-07 ENCOUNTER — Other Ambulatory Visit: Payer: Self-pay

## 2023-11-10 ENCOUNTER — Ambulatory Visit: Payer: Medicaid Other

## 2023-11-10 ENCOUNTER — Ambulatory Visit: Payer: Medicaid Other | Admitting: Hematology & Oncology

## 2023-11-10 ENCOUNTER — Inpatient Hospital Stay: Payer: Medicaid Other

## 2023-11-10 ENCOUNTER — Other Ambulatory Visit: Payer: Medicaid Other

## 2023-11-15 ENCOUNTER — Other Ambulatory Visit (HOSPITAL_BASED_OUTPATIENT_CLINIC_OR_DEPARTMENT_OTHER): Payer: Self-pay

## 2023-11-15 ENCOUNTER — Other Ambulatory Visit: Payer: Self-pay | Admitting: *Deleted

## 2023-11-15 DIAGNOSIS — C189 Malignant neoplasm of colon, unspecified: Secondary | ICD-10-CM

## 2023-11-15 MED ORDER — OXYCODONE HCL 5 MG PO TABS
5.0000 mg | ORAL_TABLET | Freq: Four times a day (QID) | ORAL | 0 refills | Status: DC | PRN
  Filled 2023-11-15: qty 60, 15d supply, fill #0

## 2023-11-18 ENCOUNTER — Encounter: Payer: Self-pay | Admitting: Hematology & Oncology

## 2023-11-18 ENCOUNTER — Ambulatory Visit
Admission: RE | Admit: 2023-11-18 | Discharge: 2023-11-18 | Disposition: A | Discharge status: Home / Self Care | Source: Ambulatory Visit | Attending: Hematology & Oncology | Admitting: Hematology & Oncology

## 2023-11-18 DIAGNOSIS — C787 Secondary malignant neoplasm of liver and intrahepatic bile duct: Secondary | ICD-10-CM

## 2023-11-18 DIAGNOSIS — C189 Malignant neoplasm of colon, unspecified: Secondary | ICD-10-CM

## 2023-11-18 HISTORY — PX: IR RADIOLOGIST EVAL & MGMT: IMG5224

## 2023-11-18 NOTE — Progress Notes (Signed)
 Reason for visit: Metastatic colon cancer. Evaluate for liver therapy.   Care Team(s): PCP; Rema Fendt, NP Hematology Oncology; Ennever,Peter R MD  History of Present Illness:  Mr. Richard Bowman is a 48 y.o. male w PMHx significant for smoking, previously healthy and diagnosed with metastatic colon CA on 11/17/22 after he presented to ER w abdominal pain. Investigations including CT CAP was revealing for hepatic metastases w suspected primary malignancy at the sigmoid colon. Diagnosis confirmed on colonoscopy. He is known to VIR s/p port placement (12/14/22, [facility]) and more recently removal (10/28/23, [address]). He is closely followed by medical oncology and Dr Myna Hidalgo, with treatments including chemoRx with FOLFOXIRI q14 days, s/p 12 cycles completed (4/17 - 08/27/23). Pt recently on bevacizumab (q21 days and began on 09/13/23), though reportedly completed a single treatment and cycle 2 deferred secondary to RUQ discomfort.   Given his disease burden in liver, locoregional therapy was recommended and he presents for evaluation. He does not seem to have a good understanding of his disease. I discussed his diagnosis and current treatment options, stressing that my offered treatment is not curative and may help control his disease burden. He denies fatigue and reports completing all his ADLs. He reports "shooting" pains along his R abdomen and back muscles. He had his port removed but has residual discomfort at his neck and back.  Review of Systems: A 12-point ROS discussed, and pertinent positives are indicated in the HPI above.  All other systems are negative.   Past Medical History:  Diagnosis Date   Cancer [facility])    Esophageal ulcer 2007   Renal disorder    CKD 10/28/20 "doesn't have it now"    Past Surgical History:  Procedure Laterality Date   BIOPSY  11/17/2022   Procedure: BIOPSY;  Surgeon: Napoleon Form, MD;  Location: [facility] ENDOSCOPY;  Service: Gastroenterology;;    COLON RESECTION SIGMOID N/A 11/18/2022   Procedure: OPEN SIGMOID COLECTOMY;  Surgeon: Manus Rudd, MD;  Location: [facility] OR;  Service: General;  Laterality: N/A;   COLONOSCOPY WITH PROPOFOL N/A 11/17/2022   Procedure: COLONOSCOPY WITH PROPOFOL;  Surgeon: Napoleon Form, MD;  Location: [facility] ENDOSCOPY;  Service: Gastroenterology;  Laterality: N/A;   ESOPHAGOGASTRODUODENOSCOPY (EGD) WITH PROPOFOL N/A 11/17/2022   Procedure: ESOPHAGOGASTRODUODENOSCOPY (EGD) WITH PROPOFOL;  Surgeon: Napoleon Form, MD;  Location: [facility] ENDOSCOPY;  Service: Gastroenterology;  Laterality: N/A;   IR IMAGING GUIDED PORT INSERTION  12/14/2022   IR RADIOLOGIST EVAL & MGMT  11/18/2023   IR REMOVAL TUN ACCESS W/ PORT W/O FL MOD SED  10/28/2023   LIVER BIOPSY N/A 11/18/2022   Procedure: LIVER BIOPSY;  Surgeon: Manus Rudd, MD;  Location: [facility] OR;  Service: General;  Laterality: N/A;   SUBMUCOSAL TATTOO INJECTION  11/17/2022   Procedure: SUBMUCOSAL TATTOO INJECTION;  Surgeon: Napoleon Form, MD;  Location: [facility] ENDOSCOPY;  Service: Gastroenterology;;    Allergies: Patient has no known allergies.  Medications: Prior to Admission medications   Medication Sig Start Date End Date Taking? Authorizing Provider  acetaminophen (TYLENOL) 500 MG tablet Take 2 tablets (1,000 mg total) by mouth every 6 (six) hours as needed for mild pain. 11/22/22   Meuth, Brooke A, PA-C  docusate sodium (COLACE) 100 MG capsule Take 1 capsule (100 mg total) by mouth 2 (two) times daily. Patient not taking: Reported on 10/20/2023 11/23/22   Kathlen Mody, MD  gabapentin (NEURONTIN) 300 MG capsule Take 1 capsule (300 mg total) by mouth 3 (three) times daily.  09/17/23 12/16/23  Josph Macho, MD  lactose free nutrition (BOOST) LIQD Take 237 mLs by mouth daily as needed (Supplement).    [provider]  lactulose (CHRONULAC) 10 GM/15ML solution Take 15 mLs (10 g total) by mouth 3 (three) times daily. Patient not taking: Reported on 09/29/2023  12/04/22   Josph Macho, MD  lidocaine-prilocaine (EMLA) cream Apply 1 Application topically as needed. Patient not taking: Reported on 10/20/2023 12/21/22   Josph Macho, MD  LORazepam (ATIVAN) 0.5 MG tablet Place 1 tablet (0.5 mg total) under the tongue every 6 (six) hours as needed for anxiety. Patient not taking: Reported on 10/20/2023 04/21/23   Josph Macho, MD  methocarbamol (ROBAXIN) 500 MG tablet Take 1 tablet (500 mg total) by mouth every 6 (six) hours as needed for muscle spasms. Patient not taking: Reported on 10/20/2023 02/24/23   Josph Macho, MD  nicotine (NICODERM CQ - DOSED IN MG/24 HOURS) 21 mg/24hr patch Place 1 patch (21 mg total) onto the skin daily. Patient not taking: Reported on 07/12/2023 11/23/22 11/23/23  Kathlen Mody, MD  nystatin (MYCOSTATIN) 100000 UNIT/ML suspension Take 5 mLs (500,000 Units total) by mouth 4 (four) times daily. Patient not taking: Reported on 07/12/2023 12/16/22   Erenest Blank, NP  oxyCODONE (OXY IR/ROXICODONE) 5 MG immediate release tablet Take 1 tablet (5 mg total) by mouth every 6 (six) hours as needed for severe pain (pain score 7-10). 11/15/23   Josph Macho, MD  pantoprazole (PROTONIX) 40 MG tablet Take 1 tablet (40 mg total) by mouth 2 (two) times daily. 10/04/23   Josph Macho, MD  polyethylene glycol (MIRALAX / GLYCOLAX) 17 g packet Take 17 g by mouth daily as needed for mild constipation. Patient not taking: Reported on 09/29/2023 11/23/22   Kathlen Mody, MD     Family History  Problem Relation Age of Onset   COPD Mother    Cancer Mother     Social History   Socioeconomic History   Marital status: Single    Spouse name: Not on file   Number of children: 0   Years of education: Not on file   Highest education level: Associate degree: occupational, Scientist, product/process development, or vocational program  Occupational History    Comment: NA   Occupation: Curator  Tobacco Use   Smoking status: Every Day    Current packs/day: 1.00     Average packs/day: 1 pack/day for 26.0 years (26.0 ttl pk-yrs)    Types: Cigarettes    Passive exposure: Current   Smokeless tobacco: Never   Tobacco comments:    Smoking half a pack/day  Vaping Use   Vaping status: Former  Substance and Sexual Activity   Alcohol use: Yes    Alcohol/week: 0.0 standard drinks of alcohol    Comment: occ   Drug use: No   Sexual activity: Not Currently  Other Topics Concern   Not on file  Social History Narrative   Not on file   Social Drivers of Health   Financial Resource Strain: Medium Risk (12/02/2022)   Overall Financial Resource Strain (CARDIA)    Difficulty of Paying Living Expenses: Somewhat hard  Food Insecurity: Patient Declined (12/20/2022)   Hunger Vital Sign    Worried About Running Out of Food in the Last Year: Patient declined    Ran Out of Food in the Last Year: Patient declined  Transportation Needs: No Transportation Needs (12/20/2022)   PRAPARE - Administrator, Civil Service (  Medical): No    Lack of Transportation (Non-Medical): No  Physical Activity: Not on file  Stress: Not on file  Social Connections: Not on file    ECOG Status: 1 - Symptomatic but completely ambulatory  Review of Systems As above  Vital Signs: BP 138/86   Pulse 70   Temp 98.5 F (36.9 C)   Resp 16   SpO2 96%   Physical Exam  General: WN, NAD  CV: RRR on monitor. R chest port site well healing, c/d/i Pulm: normal work of breathing on RA Abd: S, ND, NT MSK: Grossly normal Psych: Appropriate affect.    Imaging:  MR Abdomen, 11/04/23 IMPRESSION:  1. Multiple round enhancing hepatic metastasis in the left and right hepatic lobe.  2. Metastatic lesion in the periphery of the LEFT hepatic lobe obstructs the segment 2 bile ducts.       IR Radiologist Eval & Mgmt Result Date: 11/18/2023 EXAM: NEW PATIENT OFFICE VISIT CHIEF COMPLAINT: See below HISTORY OF PRESENT ILLNESS: See below REVIEW OF SYSTEMS: See below PHYSICAL  EXAMINATION: See below ASSESSMENT AND PLAN: Please refer to completed note in the electronic medical record on [facility] Roanna Banning, MD [facility] Electronically Signed   By: Roanna Banning M.D.   On: 11/18/2023 10:50   MR LIVER W WO CONTRAST Result Date: 11/04/2023 CLINICAL DATA:  Colorectal carcinoma with liver metastasis. Concern for portal vein thrombosis in the lateral segment LEFT hepatic lobe. EXAM: MRI ABDOMEN WITHOUT AND WITH CONTRAST TECHNIQUE: Multiplanar multisequence MR imaging of the abdomen was performed both before and after the administration of intravenous contrast. CONTRAST:  9mL GADAVIST GADOBUTROL 1 MMOL/ML IV SOLN COMPARISON:  CT 10/25/2023 FINDINGS: Lower chest:  Lung bases are clear. Hepatobiliary: Multiple round enhancing metastasis within LEFT and RIGHT hepatic lobes. For example cluster of 3 metastatic lesions in the dome of the RIGHT hepatic lobe measuring 2.6 and 2.7 cm on image 14/series 13. Within the lateral segment of the LEFT hepatic lobe, there is a large lesion measuring 3.2 3.1 cm (image 30/series 13). This lesion obstructs the more peripheral biliary ducts (image 26/series 13). This ductal dilatation is hyperintense on T2 weighted imaging (image 7/series 3). No evidence portal vein thrombosis Pancreas: Normal pancreatic parenchymal intensity. No ductal dilatation or inflammation. Spleen: Normal spleen. Adrenals/urinary tract: Adrenal glands and kidneys are normal. Stomach/Bowel: Stomach and limited of the small bowel is unremarkable Vascular/Lymphatic: Abdominal aortic normal caliber. No retroperitoneal periportal lymphadenopathy. Musculoskeletal: No aggressive osseous lesion IMPRESSION: 1. Multiple round enhancing hepatic metastasis in the left and right hepatic lobe. 2. Metastatic lesion in the periphery of the LEFT hepatic lobe obstructs the segment 2 bile ducts. Electronically Signed   By: Genevive Bi  M.D.   On: 11/04/2023 14:43   IR REMOVAL TUN ACCESS W/ PORT W/O FL MOD SED Result Date: 10/28/2023 INDICATION: 48 year old male referred for port catheter removal EXAM: REMOVAL RIGHT IJ VEIN PORT-A-CATH MEDICATIONS: None ANESTHESIA/SEDATION: None FLUOROSCOPY: None COMPLICATIONS: None PROCEDURE: Informed written consent was obtained from the patient after a thorough discussion of the procedural risks, benefits and alternatives. All questions were addressed. Maximal Sterile Barrier Technique was utilized including caps, mask, sterile gowns, sterile gloves, sterile drape, hand hygiene and skin antiseptic. A timeout was performed prior to the initiation of the procedure. Informed consent was obtained from the patient following an explanation of the procedure, risks, benefits and alternatives. The patient understands, agrees and consents for the procedure. All questions were addressed.  A time out was performed prior to the initiation of the procedure. The patient was positioned in the operation suite in the supine position on a gantry. The previous scar on the right chest was generously infiltrated with 1% lidocaine for local anesthesia. Infiltration of the skin and subcutaneous tissues surrounding the port was performed. Using sharp and blunt dissection, the port apparatus and subcutaneous catheter were removed in their entirety. The port pocket was then closed with interrupted Vicryl layer and a running subcuticular with 4-0 Monocryl. The skin was sealed with Derma bond. A sterile dressing was placed. The patient tolerated the procedure well and remained hemodynamically stable throughout. No complications were encountered and no significant blood loss was encountered. IMPRESSION: Status post right IJ port catheter removal. Signed, Yvone Neu. Miachel Roux, RPVI [facility] Electronically Signed   By: Gilmer Mor D.O.   On: 10/28/2023 09:39   CT CHEST  ABDOMEN PELVIS W CONTRAST Result Date: 10/25/2023 CLINICAL DATA:  Colon cancer restaging status post chemotherapy (2 treatments since last scan). * Tracking Code: BO * EXAM: CT CHEST, ABDOMEN, AND PELVIS WITH CONTRAST TECHNIQUE: Multidetector CT imaging of the chest, abdomen and pelvis was performed following the standard protocol during bolus administration of intravenous contrast. RADIATION DOSE REDUCTION: This exam was performed according to the departmental dose-optimization program which includes automated exposure control, adjustment of the mA and/or kV according to patient size and/or use of iterative reconstruction technique. CONTRAST:  OMNIPAQUE IOHEXOL 300 MG/ML  SOLN COMPARISON:  08/09/2023 FINDINGS: CT CHEST FINDINGS Cardiovascular: Right Port-A-Cath tip: Right atrium. Mediastinum/Nodes: Unremarkable Lungs/Pleura: Stable 2 mm right lower lobe pulmonary nodule on image 88 series 302. Several additional new small pulmonary nodules are present, the largest is a right middle lobe subpleural nodule major 6 by 4 by 6 mm on image 78 series 302. Musculoskeletal: Thoracic spondylosis. CT ABDOMEN PELVIS FINDINGS Hepatobiliary: Scattered mostly calcified metastatic lesions are observed, many of these have ill-defined margins are complex configurations making them difficult to individually accurately measure. A right hepatic lobe lesion measures 3.3 by 2.6 cm on image 59 series 301, previously 3.7 by 3.4 cm on 08/09/2023. Generally other lesions appear stable to mildly reduced in size. However there is new abnormal branching hypodensity in segment 3 of the liver as shown on images 46-51 of series 301 which is somewhat ill-defined and raises concern for possible portal vein thrombosis. This is less likely to be biliary dilatation given that these branching hypodensities have ill-defined margins. Tumor thrombus is not completely excluded. Gallbladder unremarkable.  No extrahepatic biliary dilatation. Pancreas:  Unremarkable Spleen: Stable mild splenomegaly. Adrenals/Urinary Tract: Unremarkable Stomach/Bowel: Anastomotic staple line in the distal sigmoid colon. Vascular/Lymphatic: Mild aortoiliac atherosclerotic vascular disease. No pathologic adenopathy observed. Reproductive: Unremarkable Other: No supplemental non-categorized findings. Musculoskeletal: Mild spondylosis and degenerative disc disease at L4-5. IMPRESSION: 1. New abnormal branching hypodensity in segment 3 of the liver raising concern for possible portal vein thrombosis. This is less likely to be biliary dilatation given that these branching hypodensities have ill-defined margins. Tumor thrombus is not completely excluded. Consider hepatic Doppler ultrasound with attention to this portion of the liver, or MRI with and without contrast for further characterization. Otherwise, partially calcified liver masses appear stable to mildly reduced in size. 2. Several new small pulmonary nodules are present, the largest is a right middle lobe subpleural nodule measuring 6 by 4 by 6 mm. Possibilities include metastatic disease or inflammatory process, surveillance recommended. 3. Stable mild  splenomegaly. 4. Mild spondylosis and degenerative disc disease at L4-5. 5.  Aortic Atherosclerosis (ICD10-I70.0). Electronically Signed   By: Gaylyn Rong M.D.   On: 10/25/2023 15:06    Labs:  CBC: Recent Labs    08/27/23 0925 09/10/23 1043 09/29/23 1020 10/20/23 1020  WBC 8.8 7.7 7.6 8.1  HGB 14.8 15.2 15.8 16.5  HCT 42.7 44.1 46.3 48.2  PLT 157 120* 132* 152    COAGS: No results for input(s): "INR", "APTT" in the last 8760 hours.  BMP: Recent Labs    08/27/23 0925 09/10/23 1043 09/29/23 1020 10/20/23 1020  NA 141 140 139 139  K 3.5 3.8 3.9 3.7  CL 104 106 103 104  CO2 29 25 29 27   GLUCOSE 95 105* 91 134*  BUN 14 13 12 17   CALCIUM 9.7 9.3 9.6 10.1  CREATININE 1.08 0.98 1.00 1.04  GFRNONAA >60 >60 >60 >60    LIVER FUNCTION  TESTS: Recent Labs    08/27/23 0925 09/10/23 1043 09/29/23 1020 10/20/23 1020  BILITOT 0.4 0.4 0.5 0.6  AST 35 38 45* 52*  ALT 44 43 67* 58*  ALKPHOS 179* 159* 168* 194*  PROT 6.6 7.1 6.8 7.1  ALBUMIN 4.1 3.9 4.3 4.2     Oncology:  Recent Labs    09/29/23 1020  CEA 9.57*    Latest Reference Range & Units 12/01/22 10:58 12/29/22 08:05 01/12/23 09:30 01/26/23 08:50 03/10/23 08:09 05/17/23 10:30 06/09/23 08:30  CEA ([facility]) 0.00 - 5.00 ng/mL 267.63 (H) 285.09 (H) 189.73 (H) 64.16 (H) 16.50 (H) 13.31 (H) 10.06 (H)  (H): Data is abnormally high   Cancer Staging  Colon cancer metastasized to liver [facility]) Staging form: Colon and Rectum, AJCC 8th Edition - Clinical stage from 12/01/2022: cT4, cN2, pM1 - Signed by Josph Macho, MD on 12/01/2022 Stage prefix: Initial diagnosis Histologic grade (G): G2 Histologic grading system: 4 grade system  SURGICAL PATHOLOGY CASE: [id number] PATIENT: Genelle Gather Surgical Pathology Report  Reason for Addendum #1:  DNA Mismatch Repair IHC Results Clinical History: sigmoid colon mass (cm)  FINAL MICROSCOPIC DIAGNOSIS:  A. LIVER, BIOPSY: - Metastatic adenocarcinoma morphologically consistent with colonic primary - See oncology table  B. COLON, SIGMOID, COLECTOMY: - Invasive, moderately differentiated adenocarcinoma, 5.5 cm - Carcinoma invades through the serosa - Lymphovascular invasion: Present - Perineural invasion: Present - Four of eighteen lymph nodes, positive for metastatic carcinoma (4/18) - See oncology table and comment    Assessment and Plan:  48 y/o M w Dx of metastatic colon CA and bilobar hepatic tumor burden.  Pt is mildy symptomatic and without lethargy, but endorses baseline RUQ discomfort. ECOG 1.   Treatment Hx; FOLFOXIRI q14 days, s/p 12 cycles completed (4/17 - 08/27/23) single dose of bevacizumab (Avastin) (q21 days and began on 09/13/23), currently held due to Pt discomfort   He is interested  in pursuing a minimally-invasive option for tumor debulking and locoregional control, and we discussed transarterial chemoembolization (TACE).   The procedure has been fully reviewed with the patient/patient's authorized representative. The risks, benefits and alternatives have been explained, and the patient/patient's authorized representative has consented to the procedure.   *CT AP already performed and reviewed. No additional imaging required. *Pt on q21 combination therapy including bevacizumab / Avastin. Request continued hold to decrease risk of vascular complications.  *OK proceed to schedule DEB-TACE based on mutual availability. *Procedure to be performed at [facility]. Will plan for trans-radial access. *Same day procedure, no overnight admission. *Will obtain MELD  labs (CBC, CMP, INR) on procedure day arrival.  Thank you for this interesting consult.  I greatly enjoyed meeting SAADIQ POCHE and look forward to participating in their care.  A copy of this report was sent to the requesting provider on this date.  Electronically Signed:  Roanna Banning, MD [facility] Radiology Special[facility] Radiology   Pager. [phone] Clinic. [phone]  I spent a total of 60 Minutes in face to face in clinical consultation, greater than 50% of which was counseling/coordinating care for Mr Tait Balistreri [facility] metastatic colon cancer to liver.

## 2023-11-19 ENCOUNTER — Other Ambulatory Visit: Payer: Self-pay

## 2023-11-19 ENCOUNTER — Inpatient Hospital Stay: Payer: Medicaid Other | Attending: Hematology & Oncology

## 2023-11-19 ENCOUNTER — Inpatient Hospital Stay (HOSPITAL_BASED_OUTPATIENT_CLINIC_OR_DEPARTMENT_OTHER): Payer: Medicaid Other | Admitting: Hematology & Oncology

## 2023-11-19 ENCOUNTER — Other Ambulatory Visit: Payer: Medicaid Other

## 2023-11-19 ENCOUNTER — Encounter: Payer: Self-pay | Admitting: *Deleted

## 2023-11-19 ENCOUNTER — Encounter: Payer: Self-pay | Admitting: Hematology & Oncology

## 2023-11-19 VITALS — BP 132/82 | HR 62 | Temp 98.4°F | Resp 18 | Ht 70.0 in | Wt 223.0 lb

## 2023-11-19 DIAGNOSIS — R109 Unspecified abdominal pain: Secondary | ICD-10-CM | POA: Diagnosis not present

## 2023-11-19 DIAGNOSIS — C187 Malignant neoplasm of sigmoid colon: Secondary | ICD-10-CM | POA: Insufficient documentation

## 2023-11-19 DIAGNOSIS — D509 Iron deficiency anemia, unspecified: Secondary | ICD-10-CM | POA: Diagnosis not present

## 2023-11-19 DIAGNOSIS — C787 Secondary malignant neoplasm of liver and intrahepatic bile duct: Secondary | ICD-10-CM | POA: Insufficient documentation

## 2023-11-19 DIAGNOSIS — C189 Malignant neoplasm of colon, unspecified: Secondary | ICD-10-CM

## 2023-11-19 DIAGNOSIS — Z5112 Encounter for antineoplastic immunotherapy: Secondary | ICD-10-CM | POA: Insufficient documentation

## 2023-11-19 DIAGNOSIS — Z79899 Other long term (current) drug therapy: Secondary | ICD-10-CM | POA: Diagnosis not present

## 2023-11-19 DIAGNOSIS — H9311 Tinnitus, right ear: Secondary | ICD-10-CM

## 2023-11-19 LAB — CMP (CANCER CENTER ONLY)
ALT: 44 U/L (ref 0–44)
AST: 38 U/L (ref 15–41)
Albumin: 4.3 g/dL (ref 3.5–5.0)
Alkaline Phosphatase: 178 U/L — ABNORMAL HIGH (ref 38–126)
Anion gap: 8 (ref 5–15)
BUN: 13 mg/dL (ref 6–20)
CO2: 30 mmol/L (ref 22–32)
Calcium: 9.5 mg/dL (ref 8.9–10.3)
Chloride: 103 mmol/L (ref 98–111)
Creatinine: 1.31 mg/dL — ABNORMAL HIGH (ref 0.61–1.24)
GFR, Estimated: >60 mL/min (ref >60)
Glucose, Bld: 112 mg/dL — ABNORMAL HIGH (ref 70–99)
Potassium: 4.3 mmol/L (ref 3.5–5.1)
Sodium: 141 mmol/L (ref 135–145)
Total Bilirubin: 0.4 mg/dL (ref 0.0–1.2)
Total Protein: 7.1 g/dL (ref 6.5–8.1)

## 2023-11-19 LAB — CBC WITH DIFFERENTIAL (CANCER CENTER ONLY)
Abs Immature Granulocytes: 0.03 10*3/uL (ref 0.00–0.07)
Basophils Absolute: 0 10*3/uL (ref 0.0–0.1)
Basophils Relative: 0 %
Eosinophils Absolute: 0.3 10*3/uL (ref 0.0–0.5)
Eosinophils Relative: 4 %
HCT: 48.4 % (ref 39.0–52.0)
Hemoglobin: 16.5 g/dL (ref 13.0–17.0)
Immature Granulocytes: 0 %
Lymphocytes Relative: 29 %
Lymphs Abs: 2.3 10*3/uL (ref 0.7–4.0)
MCH: 30.8 pg (ref 26.0–34.0)
MCHC: 34.1 g/dL (ref 30.0–36.0)
MCV: 90.5 fL (ref 80.0–100.0)
Monocytes Absolute: 0.6 10*3/uL (ref 0.1–1.0)
Monocytes Relative: 7 %
Neutro Abs: 4.9 10*3/uL (ref 1.7–7.7)
Neutrophils Relative %: 60 %
Platelet Count: 149 10*3/uL — ABNORMAL LOW (ref 150–400)
RBC: 5.35 MIL/uL (ref 4.22–5.81)
RDW: 13 % (ref 11.5–15.5)
WBC Count: 8.2 10*3/uL (ref 4.0–10.5)
nRBC: 0 % (ref 0.0–0.2)

## 2023-11-19 LAB — IRON AND IRON BINDING CAPACITY (CC-WL,HP ONLY)
Iron: 69 ug/dL (ref 45–182)
Saturation Ratios: 20 % (ref 17.9–39.5)
TIBC: 344 ug/dL (ref 250–450)
UIBC: 275 ug/dL (ref 117–376)

## 2023-11-19 LAB — FERRITIN: Ferritin: 127 ng/mL (ref 24–336)

## 2023-11-19 LAB — CEA (ACCESS): CEA (CHCC): 27.27 ng/mL — ABNORMAL HIGH (ref 0.00–5.00)

## 2023-11-19 NOTE — Progress Notes (Signed)
 Hematology and Oncology Follow Up Visit  Richard Bowman [id number] February 23, 1976 47 y.o. 11/19/2023  Principle Diagnosis:  Metastatic adenocarcinoma of the colon-liver metastasis -- pMMR/MSI-low -- (+) KRAS/ ERBB2(+)       Iron deficiency anemia Current Therapy:   FOLFOXIRI - s/p cycle #11  - start on  -- 12/09/2022 --oxaliplatin dropped after 04/19/2023 IV iron as indicated Xeloda/Avastin -- s/p cycle #1 on 09/13/2023 --Avastin on hold.  -Xeloda d/c'ed on 10/15/2023 Enhertu - start cycle #1 on 11/26/2023     Interim History:  Richard Bowman is back for follow-up.  He actually saw IR yesterday.  They will plan for intrahepatic therapy- TACE -to help with his liver metastasis.  He has some vomiting this past week.  He is not sure what caused this.  He said he may have gotten a little bit dehydrated.  His Port-A-Cath is now out.  He feels better having this taken out.  His last CEA level was actually holding steady at 9.6.  This is back in January.  We will have to see what the level is today.  I think what would be a good idea for his treatment now would be Enhertu.  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  This was found on his molecular studies.  As such, I think that Enhertu would not be a bad idea.  He has had no lung issues.  He does smoke.  Try to cut back on smoking.  He has had no diarrhea.  There is been no melena or bright red blood per rectum.  He has had no pain in his hands or feet.  He did have a lot of issues with Xeloda.  Overall, I would say that his performance status is probably ECOG 1.      Wt Readings from Last 3 Encounters:  11/19/23 223 lb (101.2 kg)  10/20/23 220 lb (99.8 kg)  09/29/23 221 lb (100.2 kg)    Medications:  Current Outpatient Medications:    acetaminophen (TYLENOL) 500 MG tablet, Take 2 tablets (1,000 mg total) by mouth every 6 (six) hours as needed for mild pain., Disp: 30 tablet, Rfl: 0   docusate sodium (COLACE) 100 MG capsule, Take 1  capsule (100 mg total) by mouth 2 (two) times daily. (Patient not taking: Reported on 10/20/2023), Disp: 10 capsule, Rfl: 0   gabapentin (NEURONTIN) 300 MG capsule, Take 1 capsule (300 mg total) by mouth 3 (three) times daily., Disp: 90 capsule, Rfl: 2   lactose free nutrition (BOOST) LIQD, Take 237 mLs by mouth daily as needed (Supplement)., Disp: , Rfl:    lactulose (CHRONULAC) 10 GM/15ML solution, Take 15 mLs (10 g total) by mouth 3 (three) times daily. (Patient not taking: Reported on 09/29/2023), Disp: 236 mL, Rfl: 0   lidocaine-prilocaine (EMLA) cream, Apply 1 Application topically as needed. (Patient not taking: Reported on 09/29/2023), Disp: 30 g, Rfl: 0   LORazepam (ATIVAN) 0.5 MG tablet, Place 1 tablet (0.5 mg total) under the tongue every 6 (six) hours as needed for anxiety., Disp: 60 tablet, Rfl: 0   methocarbamol (ROBAXIN) 500 MG tablet, Take 1 tablet (500 mg total) by mouth every 6 (six) hours as needed for muscle spasms., Disp: 30 tablet, Rfl: 0   nicotine (NICODERM CQ - DOSED IN MG/24 HOURS) 21 mg/24hr patch, Place 1 patch (21 mg total) onto the skin daily. (Patient not taking: Reported on 07/12/2023), Disp: 30 patch, Rfl: 1   nystatin (MYCOSTATIN) 100000 UNIT/ML suspension, Take 5  mLs (500,000 Units total) by mouth 4 (four) times daily., Disp: 473 mL, Rfl: 1   oxyCODONE (OXY IR/ROXICODONE) 5 MG immediate release tablet, Take 1 tablet (5 mg total) by mouth every 6 (six) hours as needed for severe pain (pain score 7-10)., Disp: 60 tablet, Rfl: 0   pantoprazole (PROTONIX) 40 MG tablet, Take 1 tablet (40 mg total) by mouth 2 (two) times daily., Disp: 60 tablet, Rfl: 4   polyethylene glycol (MIRALAX / GLYCOLAX) 17 g packet, Take 17 g by mouth daily as needed for mild constipation. (Patient not taking: Reported on 09/29/2023), Disp: 14 each, Rfl: 0  Allergies: No Known Allergies  Past Medical History, Surgical history, Social history, and Family History were reviewed and updated.  Review of  Systems: Review of Systems  Constitutional: Negative.   HENT:  Negative.  Negative for hearing loss.   Eyes: Negative.  Negative for eye problems.  Respiratory: Negative.    Cardiovascular: Negative.   Gastrointestinal:  Positive for abdominal pain. Negative for constipation.  Endocrine: Negative.   Genitourinary: Negative.    Musculoskeletal: Negative.   Skin: Negative.   Neurological: Negative.        Numbness and tingling of hands and feet  Hematological: Negative.   Psychiatric/Behavioral: Negative.      Physical Exam: Vitals:   11/19/23 1200  BP: 132/82  Pulse: 62  Resp: 18  Temp: 98.4 F (36.9 C)  SpO2: 98%   Wt Readings from Last 3 Encounters:  11/19/23 223 lb (101.2 kg)  10/20/23 220 lb (99.8 kg)  09/29/23 221 lb (100.2 kg)    Physical Exam Vitals reviewed.  HENT:     Head: Normocephalic and atraumatic.  Eyes:     Pupils: Pupils are equal, round, and reactive to light.  Cardiovascular:     Rate and Rhythm: Normal rate and regular rhythm.     Heart sounds: Normal heart sounds.  Pulmonary:     Effort: Pulmonary effort is normal.     Breath sounds: Normal breath sounds.  Abdominal:     General: Bowel sounds are normal.     Palpations: Abdomen is soft.     Comments: Abdominal exam shows a well-healed laparotomy scar.  He has no fluid wave.  There is no guarding or rebound tenderness.    Musculoskeletal:        General: No tenderness or deformity. Normal range of motion.     Cervical back: Normal range of motion.  Lymphadenopathy:     Cervical: No cervical adenopathy.  Skin:    General: Skin is warm and dry.     Findings: No erythema or rash.  Neurological:     Mental Status: He is alert and oriented to person, place, and time.  Psychiatric:        Behavior: Behavior normal.        Thought Content: Thought content normal.        Judgment: Judgment normal.     Lab Results  Component Value Date   WBC 8.2 11/19/2023   HGB 16.5 11/19/2023   HCT  48.4 11/19/2023   MCV 90.5 11/19/2023   PLT 149 (L) 11/19/2023     Chemistry      Component Value Date/Time   NA 141 11/19/2023 1151   K 4.3 11/19/2023 1151   CL 103 11/19/2023 1151   CO2 30 11/19/2023 1151   BUN 13 11/19/2023 1151   CREATININE 1.31 (H) 11/19/2023 1151      Component Value Date/Time  CALCIUM 9.5 11/19/2023 1151   ALKPHOS 178 (H) 11/19/2023 1151   AST 38 11/19/2023 1151   ALT 44 11/19/2023 1151   BILITOT 0.4 11/19/2023 1151      Impression and Plan:  Mr. Giammarco is a very nice 48 year old white male.  He was found to have metastatic colon cancer.  Again, I am happy that IR will be able to do intrahepatic therapy.  I think that for systemic therapy right now, we should consider Enhertu form.  He does have some tiny lung nodules which I am sure would represent malignancy.  It will be interesting to see what his CEA level is.  I will try to get started next week.  Hopefully his insurance will cover this.  I do not see a reason why not since his cancer is HER2 positive.  I will plan to see him back myself in 4 weeks.     Josph Macho, MD 3/21/20251:19 PM

## 2023-11-19 NOTE — Progress Notes (Signed)
 Patient seen by IR and he will be scheduled for intrahepatic TACE therapy. He will also begin new systemic therapy with Enhertu; plan to start next week.   Oncology Nurse Navigator Documentation     11/19/2023   12:30 PM  Oncology Nurse Navigator Flowsheets  Navigator Follow Up Date: 12/17/2023  Navigator Follow Up Reason: Follow-up Appointment;Chemotherapy  Navigator Location [facility]  Navigator Encounter Type Follow-up Appt  Patient Visit Type MedOnc  Treatment Phase Active Tx  Barriers/Navigation Needs Coordination of Care  Interventions None Required  Acuity Level 1-No Barriers  Time Spent with Patient 15

## 2023-11-19 NOTE — Progress Notes (Signed)
 DISCONTINUE ON PATHWAY REGIMEN - Colorectal     A cycle is every 14 days:     Bevacizumab-xxxx      Irinotecan      Oxaliplatin      Leucovorin      Fluorouracil   **Always confirm dose/schedule in your pharmacy ordering system**  PRIOR TREATMENT: [id number]: mFOLFOXIRI + Bevacizumab q14 Days  START ON PATHWAY REGIMEN - Colorectal     A cycle is every 21 days:     Fam-trastuzumab deruxtecan-nxki   **Always confirm dose/schedule in your pharmacy ordering system**  Patient Characteristics: Distant Metastases, Nonsurgical Candidate, KRAS G12C Mutation Positive (BRAF V600 Wild-Type/Unknown), Targeted/Immunotherapy (MSI-H/dMMR or HER2-Positive), HER2 Positive Tumor Location: Colon Therapeutic Status: Distant Metastases Microsatellite/Mismatch Repair Status: MSS/pMMR BRAF Mutation Status: Wild-Type (no mutation) KRAS/NRAS Mutation Status: KRAS G12C Mutation Positive Preferred Therapy Approach: Targeted/Immunotherapy (MSI-H/dMMR or  HER2-Positive) HER2 Status: Positive Intent of Therapy: Non-Curative / Palliative Intent, Discussed with Patient

## 2023-11-20 ENCOUNTER — Other Ambulatory Visit: Payer: Self-pay

## 2023-11-22 ENCOUNTER — Encounter: Payer: Self-pay | Admitting: Hematology & Oncology

## 2023-11-23 ENCOUNTER — Other Ambulatory Visit: Payer: Self-pay

## 2023-11-24 NOTE — Progress Notes (Unsigned)
 Pharmacist Chemotherapy Monitoring - Initial Assessment    Anticipated start date: 11/26/23   The following has been reviewed per standard work regarding the patient's treatment regimen: The patient's diagnosis, treatment plan and drug doses, and organ/hematologic function Lab orders and baseline tests specific to treatment regimen  The treatment plan start date, drug sequencing, and pre-medications Prior authorization status  Patient's documented medication list, including drug-drug interaction screen and prescriptions for anti-emetics and supportive care specific to the treatment regimen The drug concentrations, fluid compatibility, administration routes, and timing of the medications to be used The patient's access for treatment and lifetime cumulative dose history, if applicable  The patient's medication allergies and previous infusion related reactions, if applicable   Changes made to treatment plan:  {changes made:25719}  Follow up needed:  {RX follow up:25721}   Klaryssa Fauth, Nevin Bloodgood, [facility] [place], 11/24/2023  8:53 AM

## 2023-11-26 ENCOUNTER — Other Ambulatory Visit: Payer: Self-pay | Admitting: Hematology & Oncology

## 2023-11-26 ENCOUNTER — Inpatient Hospital Stay

## 2023-11-26 ENCOUNTER — Other Ambulatory Visit

## 2023-11-26 ENCOUNTER — Encounter: Payer: Self-pay | Admitting: Hematology & Oncology

## 2023-11-26 ENCOUNTER — Other Ambulatory Visit (HOSPITAL_BASED_OUTPATIENT_CLINIC_OR_DEPARTMENT_OTHER): Payer: Self-pay

## 2023-11-26 DIAGNOSIS — Z5112 Encounter for antineoplastic immunotherapy: Secondary | ICD-10-CM | POA: Diagnosis not present

## 2023-11-26 DIAGNOSIS — C189 Malignant neoplasm of colon, unspecified: Secondary | ICD-10-CM

## 2023-11-26 LAB — CMP (CANCER CENTER ONLY)
ALT: 73 U/L — ABNORMAL HIGH (ref 0–44)
AST: 52 U/L — ABNORMAL HIGH (ref 15–41)
Albumin: 4.5 g/dL (ref 3.5–5.0)
Alkaline Phosphatase: 215 U/L — ABNORMAL HIGH (ref 38–126)
Anion gap: 10 (ref 5–15)
BUN: 18 mg/dL (ref 6–20)
CO2: 26 mmol/L (ref 22–32)
Calcium: 9.8 mg/dL (ref 8.9–10.3)
Chloride: 103 mmol/L (ref 98–111)
Creatinine: 1.11 mg/dL (ref 0.61–1.24)
GFR, Estimated: >60 mL/min (ref >60)
Glucose, Bld: 113 mg/dL — ABNORMAL HIGH (ref 70–99)
Potassium: 4.2 mmol/L (ref 3.5–5.1)
Sodium: 139 mmol/L (ref 135–145)
Total Bilirubin: 0.9 mg/dL (ref 0.0–1.2)
Total Protein: 7.4 g/dL (ref 6.5–8.1)

## 2023-11-26 LAB — CBC WITH DIFFERENTIAL (CANCER CENTER ONLY)
Abs Immature Granulocytes: 0.03 10*3/uL (ref 0.00–0.07)
Basophils Absolute: 0 10*3/uL (ref 0.0–0.1)
Basophils Relative: 0 %
Eosinophils Absolute: 0.4 10*3/uL (ref 0.0–0.5)
Eosinophils Relative: 4 %
HCT: 51.2 % (ref 39.0–52.0)
Hemoglobin: 17.5 g/dL — ABNORMAL HIGH (ref 13.0–17.0)
Immature Granulocytes: 0 %
Lymphocytes Relative: 24 %
Lymphs Abs: 2.5 10*3/uL (ref 0.7–4.0)
MCH: 30.2 pg (ref 26.0–34.0)
MCHC: 34.2 g/dL (ref 30.0–36.0)
MCV: 88.4 fL (ref 80.0–100.0)
Monocytes Absolute: 0.7 10*3/uL (ref 0.1–1.0)
Monocytes Relative: 7 %
Neutro Abs: 6.5 10*3/uL (ref 1.7–7.7)
Neutrophils Relative %: 65 %
Platelet Count: 168 10*3/uL (ref 150–400)
RBC: 5.79 MIL/uL (ref 4.22–5.81)
RDW: 13.2 % (ref 11.5–15.5)
WBC Count: 10.2 10*3/uL (ref 4.0–10.5)
nRBC: 0 % (ref 0.0–0.2)

## 2023-11-26 MED ORDER — OXYCODONE HCL 5 MG PO TABS
5.0000 mg | ORAL_TABLET | ORAL | 0 refills | Status: DC | PRN
Start: 2023-11-26 — End: 2023-12-14
  Filled 2023-11-26: qty 60, 10d supply, fill #0

## 2023-11-26 NOTE — Progress Notes (Signed)
 Patient had all of his questions answered and a new prescription given today by navigator Deberah Castle. RN  Patient will reschedule his new chemotherapy appt for next week. No labs will be needed as they were done today.

## 2023-11-29 ENCOUNTER — Other Ambulatory Visit (HOSPITAL_COMMUNITY): Payer: Self-pay | Admitting: Interventional Radiology

## 2023-11-29 ENCOUNTER — Other Ambulatory Visit (HOSPITAL_BASED_OUTPATIENT_CLINIC_OR_DEPARTMENT_OTHER): Payer: Self-pay

## 2023-11-29 ENCOUNTER — Inpatient Hospital Stay

## 2023-11-29 ENCOUNTER — Encounter: Payer: Self-pay | Admitting: Hematology & Oncology

## 2023-11-29 VITALS — BP 105/63 | HR 60 | Temp 98.1°F | Resp 18

## 2023-11-29 DIAGNOSIS — C787 Secondary malignant neoplasm of liver and intrahepatic bile duct: Secondary | ICD-10-CM

## 2023-11-29 DIAGNOSIS — C189 Malignant neoplasm of colon, unspecified: Secondary | ICD-10-CM

## 2023-11-29 DIAGNOSIS — Z5112 Encounter for antineoplastic immunotherapy: Secondary | ICD-10-CM | POA: Diagnosis not present

## 2023-11-29 MED ORDER — HYDROMORPHONE HCL 1 MG/ML IJ SOLN
2.0000 mg | Freq: Once | INTRAMUSCULAR | Status: AC
  Administered 2023-11-29: 2 mg via INTRAVENOUS
  Filled 2023-11-29: qty 2

## 2023-11-29 MED ORDER — DEXTROSE 5 % IV SOLN
5.4000 mg/kg | Freq: Once | INTRAVENOUS | Status: AC
  Administered 2023-11-29: 500 mg via INTRAVENOUS
  Filled 2023-11-29: qty 25

## 2023-11-29 MED ORDER — SODIUM CHLORIDE 0.9 % IV SOLN
150.0000 mg | Freq: Once | INTRAVENOUS | Status: AC
  Administered 2023-11-29: 150 mg via INTRAVENOUS
  Filled 2023-11-29: qty 150

## 2023-11-29 MED ORDER — DIPHENHYDRAMINE HCL 25 MG PO CAPS
50.0000 mg | ORAL_CAPSULE | Freq: Once | ORAL | Status: AC
  Administered 2023-11-29: 50 mg via ORAL
  Filled 2023-11-29: qty 2

## 2023-11-29 MED ORDER — LIDOCAINE-PRILOCAINE 2.5-2.5 % EX CREA
TOPICAL_CREAM | CUTANEOUS | 3 refills | Status: DC
Start: 2023-11-29 — End: 2024-04-25
  Filled 2023-11-29: qty 30, 30d supply, fill #0

## 2023-11-29 MED ORDER — DEXAMETHASONE SODIUM PHOSPHATE 10 MG/ML IJ SOLN
10.0000 mg | Freq: Once | INTRAMUSCULAR | Status: AC
  Administered 2023-11-29: 10 mg via INTRAVENOUS
  Filled 2023-11-29: qty 1

## 2023-11-29 MED ORDER — DEXAMETHASONE 4 MG PO TABS
ORAL_TABLET | ORAL | 1 refills | Status: DC
Start: 2023-11-29 — End: 2024-04-25
  Filled 2023-11-29: qty 30, 15d supply, fill #0

## 2023-11-29 MED ORDER — PALONOSETRON HCL INJECTION 0.25 MG/5ML
0.2500 mg | Freq: Once | INTRAVENOUS | Status: AC
  Administered 2023-11-29: 0.25 mg via INTRAVENOUS
  Filled 2023-11-29: qty 5

## 2023-11-29 MED ORDER — ONDANSETRON HCL 8 MG PO TABS
8.0000 mg | ORAL_TABLET | Freq: Three times a day (TID) | ORAL | 1 refills | Status: DC | PRN
  Filled 2023-11-29: qty 30, 10d supply, fill #0

## 2023-11-29 MED ORDER — ACETAMINOPHEN 325 MG PO TABS
650.0000 mg | ORAL_TABLET | Freq: Once | ORAL | Status: AC
  Administered 2023-11-29: 650 mg via ORAL
  Filled 2023-11-29: qty 2

## 2023-11-29 MED ORDER — DEXTROSE 5 % IV SOLN: INTRAVENOUS | Status: DC

## 2023-11-29 MED ORDER — PROCHLORPERAZINE MALEATE 10 MG PO TABS
10.0000 mg | ORAL_TABLET | Freq: Four times a day (QID) | ORAL | 1 refills | Status: DC | PRN
  Filled 2023-11-29 – 2023-12-14 (×2): qty 30, 8d supply, fill #0

## 2023-11-29 NOTE — Patient Instructions (Signed)
 [facility] - A DEPT OF [facility]  Discharge Instructions: Thank you for choosing [facility] to provide your oncology and hematology care.   If you have a lab appointment with the Cancer Center, please go directly to the Cancer Center and check in at the registration area.  Wear comfortable clothing and clothing appropriate for easy access to any Portacath or PICC line.   We strive to give you quality time with your provider. You may need to reschedule your appointment if you arrive late (15 or more minutes).  Arriving late affects you and other patients whose appointments are after yours.  Also, if you miss three or more appointments without notifying the office, you may be dismissed from the clinic at the provider's discretion.      For prescription refill requests, have your pharmacy contact our office and allow 72 hours for refills to be completed.    Today you received the following chemotherapy and/or immunotherapy agents enhertu    To help prevent nausea and vomiting after your treatment, we encourage you to take your nausea medication as directed.  BELOW ARE SYMPTOMS THAT SHOULD BE REPORTED IMMEDIATELY: *FEVER GREATER THAN 100.4 F (38 C) OR HIGHER *CHILLS OR SWEATING *NAUSEA AND VOMITING THAT IS NOT CONTROLLED WITH YOUR NAUSEA MEDICATION *UNUSUAL SHORTNESS OF BREATH *UNUSUAL BRUISING OR BLEEDING *URINARY PROBLEMS (pain or burning when urinating, or frequent urination) *BOWEL PROBLEMS (unusual diarrhea, constipation, pain near the anus) TENDERNESS IN MOUTH AND THROAT WITH OR WITHOUT PRESENCE OF ULCERS (sore throat, sores in mouth, or a toothache) UNUSUAL RASH, SWELLING OR PAIN  UNUSUAL VAGINAL DISCHARGE OR ITCHING   Items with * indicate a potential emergency and should be followed up as soon as possible or go to the Emergency Department if any problems should occur.  Please show the CHEMOTHERAPY ALERT CARD or IMMUNOTHERAPY ALERT  CARD at check-in to the Emergency Department and triage nurse. Should you have questions after your visit or need to cancel or reschedule your appointment, please contact [facility] - A DEPT OF [facility]  [phone] and follow the prompts.  Office hours are 8:00 a.m. to 4:30 p.m. Monday - Friday. Please note that voicemails left after 4:00 p.m. may not be returned until the following business day.  We are closed weekends and major holidays. You have access to a nurse at all times for urgent questions. Please call the main number to the clinic [phone] and follow the prompts.  For any non-urgent questions, you may also contact your provider using MyChart. We now offer e-Visits for anyone 81 and older to request care online for non-urgent symptoms. For details visit [email or web address].   Also download the MyChart app! Go to the app store, search "MyChart", open the app, select [place], and log in with your MyChart username and password.

## 2023-11-30 ENCOUNTER — Other Ambulatory Visit: Payer: Self-pay

## 2023-11-30 ENCOUNTER — Telehealth: Payer: Self-pay

## 2023-11-30 NOTE — Telephone Encounter (Signed)
 Patient called stating he has "a streak of bruising" across his side "near his liver" states the bruising appearance has went away but still painful and worse when coughing, stretching. Is taking his oxycodone as prescribed but wanted to know what this could be. Fowarded to MD for review.

## 2023-12-03 ENCOUNTER — Other Ambulatory Visit: Payer: Self-pay | Admitting: Hematology & Oncology

## 2023-12-03 ENCOUNTER — Encounter: Payer: Self-pay | Admitting: Hematology & Oncology

## 2023-12-03 NOTE — Telephone Encounter (Signed)
 Hi Mr. Eddinger, It probably is from the mets to the liver.  I am not sure when he will have Y-90 therapy.  He is on  Enhertu to try to shrink the cancers!!   Thanks Cindee Lame

## 2023-12-06 ENCOUNTER — Emergency Department (HOSPITAL_BASED_OUTPATIENT_CLINIC_OR_DEPARTMENT_OTHER)
Admission: EM | Admit: 2023-12-06 | Discharge: 2023-12-06 | Disposition: A | Discharge status: Home / Self Care | Attending: Emergency Medicine | Admitting: Emergency Medicine

## 2023-12-06 ENCOUNTER — Encounter (HOSPITAL_BASED_OUTPATIENT_CLINIC_OR_DEPARTMENT_OTHER): Payer: Self-pay | Admitting: Emergency Medicine

## 2023-12-06 ENCOUNTER — Emergency Department (HOSPITAL_BASED_OUTPATIENT_CLINIC_OR_DEPARTMENT_OTHER)

## 2023-12-06 ENCOUNTER — Other Ambulatory Visit (HOSPITAL_BASED_OUTPATIENT_CLINIC_OR_DEPARTMENT_OTHER): Payer: Self-pay

## 2023-12-06 ENCOUNTER — Other Ambulatory Visit: Payer: Self-pay

## 2023-12-06 DIAGNOSIS — Z85038 Personal history of other malignant neoplasm of large intestine: Secondary | ICD-10-CM | POA: Diagnosis not present

## 2023-12-06 DIAGNOSIS — F17210 Nicotine dependence, cigarettes, uncomplicated: Secondary | ICD-10-CM | POA: Insufficient documentation

## 2023-12-06 DIAGNOSIS — R109 Unspecified abdominal pain: Secondary | ICD-10-CM | POA: Insufficient documentation

## 2023-12-06 DIAGNOSIS — R112 Nausea with vomiting, unspecified: Secondary | ICD-10-CM | POA: Diagnosis present

## 2023-12-06 DIAGNOSIS — Z8505 Personal history of malignant neoplasm of liver: Secondary | ICD-10-CM | POA: Diagnosis not present

## 2023-12-06 LAB — CBC WITH DIFFERENTIAL/PLATELET
Abs Immature Granulocytes: 0.06 10*3/uL (ref 0.00–0.07)
Basophils Absolute: 0 10*3/uL (ref 0.0–0.1)
Basophils Relative: 0 %
Eosinophils Absolute: 0.4 10*3/uL (ref 0.0–0.5)
Eosinophils Relative: 4 %
HCT: 46.4 % (ref 39.0–52.0)
Hemoglobin: 16.2 g/dL (ref 13.0–17.0)
Immature Granulocytes: 1 %
Lymphocytes Relative: 19 %
Lymphs Abs: 1.8 10*3/uL (ref 0.7–4.0)
MCH: 30.5 pg (ref 26.0–34.0)
MCHC: 34.9 g/dL (ref 30.0–36.0)
MCV: 87.4 fL (ref 80.0–100.0)
Monocytes Absolute: 0.4 10*3/uL (ref 0.1–1.0)
Monocytes Relative: 4 %
Neutro Abs: 6.8 10*3/uL (ref 1.7–7.7)
Neutrophils Relative %: 72 %
Platelets: 125 10*3/uL — ABNORMAL LOW (ref 150–400)
RBC: 5.31 MIL/uL (ref 4.22–5.81)
RDW: 12.8 % (ref 11.5–15.5)
WBC: 9.4 10*3/uL (ref 4.0–10.5)
nRBC: 0 % (ref 0.0–0.2)

## 2023-12-06 LAB — COMPREHENSIVE METABOLIC PANEL WITH GFR
ALT: 121 U/L — ABNORMAL HIGH (ref 0–44)
AST: 56 U/L — ABNORMAL HIGH (ref 15–41)
Albumin: 3.6 g/dL (ref 3.5–5.0)
Alkaline Phosphatase: 234 U/L — ABNORMAL HIGH (ref 38–126)
Anion gap: 10 (ref 5–15)
BUN: 20 mg/dL (ref 6–20)
CO2: 24 mmol/L (ref 22–32)
Calcium: 9.2 mg/dL (ref 8.9–10.3)
Chloride: 100 mmol/L (ref 98–111)
Creatinine, Ser: 0.99 mg/dL (ref 0.61–1.24)
GFR, Estimated: >60 mL/min (ref >60)
Glucose, Bld: 92 mg/dL (ref 70–99)
Potassium: 4.3 mmol/L (ref 3.5–5.1)
Sodium: 134 mmol/L — ABNORMAL LOW (ref 135–145)
Total Bilirubin: 0.9 mg/dL (ref 0.0–1.2)
Total Protein: 7.3 g/dL (ref 6.5–8.1)

## 2023-12-06 LAB — LIPASE, BLOOD: Lipase: 41 U/L (ref 11–51)

## 2023-12-06 MED ORDER — METOCLOPRAMIDE HCL 10 MG PO TABS
10.0000 mg | ORAL_TABLET | Freq: Four times a day (QID) | ORAL | 0 refills | Status: DC
  Filled 2023-12-06 – 2023-12-14 (×2): qty 30, 8d supply, fill #0

## 2023-12-06 MED ORDER — IOHEXOL 300 MG/ML  SOLN
100.0000 mL | Freq: Once | INTRAMUSCULAR | Status: AC | PRN
  Administered 2023-12-06: 100 mL via INTRAVENOUS

## 2023-12-06 MED ORDER — DROPERIDOL 2.5 MG/ML IJ SOLN
2.5000 mg | Freq: Once | INTRAMUSCULAR | Status: AC
  Administered 2023-12-06: 2.5 mg via INTRAVENOUS
  Filled 2023-12-06: qty 2

## 2023-12-06 MED ORDER — SODIUM CHLORIDE 0.9 % IV BOLUS
1000.0000 mL | Freq: Once | INTRAVENOUS | Status: AC
  Administered 2023-12-06: 1000 mL via INTRAVENOUS

## 2023-12-06 NOTE — ED Provider Notes (Signed)
 [place] [facility] Provider Note  CSN: [id number] Arrival date & time: 12/06/23 1545  Chief Complaint(s) Emesis  HPI Richard Bowman is a 48 y.o. male with a history of colon cancer with metastases to the liver who is here today for 3 days of abdominal pain, nausea and constipation.  Patient currently receiving chemotherapy, last received oral chemotherapy 1 week ago.  He says that over the last couple of days he has been increasingly nauseated, been unable to eat or drink.  Said multiple episodes of emesis, has been having increasing pain on his right side.  Denies any fever or chills.   Past Medical History Past Medical History:  Diagnosis Date   Cancer (HCC)    Esophageal ulcer 2007   Renal disorder    CKD 10/28/20 "doesn't have it now"   Patient Active Problem List   Diagnosis Date Noted   Genetic testing 12/28/2022   Chest pain 12/19/2022   Hyperkalemia 12/18/2022   Generalized abdominal pain 11/17/2022   Colon cancer metastasized to liver (HCC) 11/17/2022   Abnormal CT of the abdomen 11/17/2022   Mass of colon 11/17/2022   Transaminitis 11/16/2022   Rectal bleeding 11/16/2022   Left arm pain 10/05/2020   Body aches 04/22/2016   Joint pain 04/02/2016   Muscle spasm 04/02/2016   Smoker 06/12/2014   Seizure-like activity (HCC) 06/12/2014   Home Medication(s) Prior to Admission medications   Medication Sig Start Date End Date Taking? Authorizing Provider  metoCLOPramide (REGLAN) 10 MG tablet Take 1 tablet (10 mg total) by mouth every 6 (six) hours. 12/06/23  Yes Anders Simmonds T, DO  acetaminophen (TYLENOL) 500 MG tablet Take 2 tablets (1,000 mg total) by mouth every 6 (six) hours as needed for mild pain. 11/22/22   Meuth, Brooke A, PA-C  dexamethasone (DECADRON) 4 MG tablet Take 2 tablets (8 mg) by mouth daily for 3 days starting the day after chemotherapy. Take with food. 11/29/23   Josph Macho, MD  docusate sodium (COLACE) 100  MG capsule Take 1 capsule (100 mg total) by mouth 2 (two) times daily. Patient not taking: Reported on 10/20/2023 11/23/22   Kathlen Mody, MD  gabapentin (NEURONTIN) 300 MG capsule Take 1 capsule (300 mg total) by mouth 3 (three) times daily. 09/17/23 12/16/23  Josph Macho, MD  lactose free nutrition (BOOST) LIQD Take 237 mLs by mouth daily as needed (Supplement).    [provider]  lactulose (CHRONULAC) 10 GM/15ML solution Take 15 mLs (10 g total) by mouth 3 (three) times daily. Patient not taking: Reported on 09/29/2023 12/04/22   Josph Macho, MD  lidocaine-prilocaine (EMLA) cream Apply 1 Application topically as needed. Patient not taking: Reported on 09/29/2023 12/21/22   Josph Macho, MD  lidocaine-prilocaine (EMLA) cream Apply to affected area once 11/29/23   Josph Macho, MD  LORazepam (ATIVAN) 0.5 MG tablet Place 1 tablet (0.5 mg total) under the tongue every 6 (six) hours as needed for anxiety. 04/21/23   Josph Macho, MD  methocarbamol (ROBAXIN) 500 MG tablet Take 1 tablet (500 mg total) by mouth every 6 (six) hours as needed for muscle spasms. 02/24/23   Josph Macho, MD  nystatin (MYCOSTATIN) 100000 UNIT/ML suspension Take 5 mLs (500,000 Units total) by mouth 4 (four) times daily. 12/16/22   Erenest Blank, NP  ondansetron (ZOFRAN) 8 MG tablet Take 1 tablet (8 mg total) by mouth every 8 (eight) hours as needed for nausea  or vomiting. Start on the third day after chemotherapy. 11/29/23   Josph Macho, MD  oxyCODONE (OXY IR/ROXICODONE) 5 MG immediate release tablet Take 1 tablet (5 mg total) by mouth every 4 (four) hours as needed for severe pain (pain score 7-10). 11/26/23   Josph Macho, MD  pantoprazole (PROTONIX) 40 MG tablet Take 1 tablet (40 mg total) by mouth 2 (two) times daily. 10/04/23   Josph Macho, MD  polyethylene glycol (MIRALAX / GLYCOLAX) 17 g packet Take 17 g by mouth daily as needed for mild constipation. Patient not taking: Reported on  09/29/2023 11/23/22   Kathlen Mody, MD  prochlorperazine (COMPAZINE) 10 MG tablet Take 1 tablet (10 mg total) by mouth every 6 (six) hours as needed for nausea or vomiting. 11/29/23   Josph Macho, MD                                                                                                                                    Past Surgical History Past Surgical History:  Procedure Laterality Date   BIOPSY  11/17/2022   Procedure: BIOPSY;  Surgeon: Napoleon Form, MD;  Location: [facility] ENDOSCOPY;  Service: Gastroenterology;;   COLON RESECTION SIGMOID N/A 11/18/2022   Procedure: OPEN SIGMOID COLECTOMY;  Surgeon: Manus Rudd, MD;  Location: [facility] OR;  Service: General;  Laterality: N/A;   COLONOSCOPY WITH PROPOFOL N/A 11/17/2022   Procedure: COLONOSCOPY WITH PROPOFOL;  Surgeon: Napoleon Form, MD;  Location: [facility] ENDOSCOPY;  Service: Gastroenterology;  Laterality: N/A;   ESOPHAGOGASTRODUODENOSCOPY (EGD) WITH PROPOFOL N/A 11/17/2022   Procedure: ESOPHAGOGASTRODUODENOSCOPY (EGD) WITH PROPOFOL;  Surgeon: Napoleon Form, MD;  Location: [facility] ENDOSCOPY;  Service: Gastroenterology;  Laterality: N/A;   IR IMAGING GUIDED PORT INSERTION  12/14/2022   IR RADIOLOGIST EVAL & MGMT  11/18/2023   IR REMOVAL TUN ACCESS W/ PORT W/O FL MOD SED  10/28/2023   LIVER BIOPSY N/A 11/18/2022   Procedure: LIVER BIOPSY;  Surgeon: Manus Rudd, MD;  Location: [facility] OR;  Service: General;  Laterality: N/A;   SUBMUCOSAL TATTOO INJECTION  11/17/2022   Procedure: SUBMUCOSAL TATTOO INJECTION;  Surgeon: Napoleon Form, MD;  Location: [facility] ENDOSCOPY;  Service: Gastroenterology;;   Family History Family History  Problem Relation Age of Onset   COPD Mother    Cancer Mother     Social History Social History   Tobacco Use   Smoking status: Every Day    Current packs/day: 1.00    Average packs/day: 1 pack/day for 26.0 years (26.0 ttl pk-yrs)    Types: Cigarettes    Passive exposure: Current   Smokeless tobacco:  Never   Tobacco comments:    Smoking half a pack/day  Vaping Use   Vaping status: Former  Substance Use Topics   Alcohol use: Yes    Alcohol/week: 0.0 standard drinks of alcohol    Comment: occ   Drug use: No  Allergies Patient has no known allergies.  Review of Systems Review of Systems  Physical Exam Vital Signs  I have reviewed the triage vital signs BP 121/69 (BP Location: Left Arm)   Pulse (!) 58   Temp 98.7 F (37.1 C)   Resp 18   SpO2 98%   Physical Exam Vitals reviewed.  HENT:     Head: Normocephalic.  Cardiovascular:     Rate and Rhythm: Normal rate.  Pulmonary:     Effort: Pulmonary effort is normal.  Abdominal:     General: Abdomen is flat. There is distension.     Palpations: Abdomen is soft.     Tenderness: There is no abdominal tenderness. There is no guarding.  Musculoskeletal:        General: Normal range of motion.  Neurological:     General: No focal deficit present.     Comments: No asterixis     ED Results and Treatments Labs (all labs ordered are listed, but only abnormal results are displayed) Labs Reviewed  CBC WITH DIFFERENTIAL/PLATELET - Abnormal; Notable for the following components:      Result Value   Platelets 125 (*)    All other components within normal limits  COMPREHENSIVE METABOLIC PANEL WITH GFR - Abnormal; Notable for the following components:   Sodium 134 (*)    AST 56 (*)    ALT 121 (*)    Alkaline Phosphatase 234 (*)    All other components within normal limits  LIPASE, BLOOD                                                                                                                          Radiology CT ABDOMEN PELVIS W CONTRAST Result Date: 12/06/2023 CLINICAL DATA:  Bowel obstruction, vomiting.  Colon cancer. EXAM: CT ABDOMEN AND PELVIS WITH CONTRAST TECHNIQUE: Multidetector CT imaging of the abdomen and pelvis was performed using the standard protocol following bolus administration of intravenous contrast.  RADIATION DOSE REDUCTION: This exam was performed according to the departmental dose-optimization program which includes automated exposure control, adjustment of the mA and/or kV according to patient size and/or use of iterative reconstruction technique. CONTRAST:  OMNIPAQUE IOHEXOL 300 MG/ML SOLN and oral contrast. COMPARISON:  10/25/2023. FINDINGS: Lower chest: Dependent basilar subsegmental atelectasis. No pleural or pericardial effusions. Hepatobiliary: Multiple hepatic lesions consistent with metastatic disease with definite interval progression. For example 1 lesion in the right lobe previously measured 3.3 cm and now measures 3.7 cm. Additional lesions are seen which have also increased in size. No biliary ductal dilatation. Unremarkable gallbladder. Pancreas: Unremarkable. No pancreatic ductal dilatation or surrounding inflammatory changes. Spleen: Normal in size without focal abnormality. Adrenals/Urinary Tract: Adrenal glands are unremarkable. Kidneys are normal, without renal calculi, focal lesion, or hydronephrosis. Bladder is unremarkable. CT Stomach/Bowel: Stomach is within normal limits. Appendix not discretely visualized. No evidence of bowel wall thickening, distention, or inflammatory changes. Rectosigmoid anastomosis. Vascular/Lymphatic: Aortic atherosclerosis. No enlarged abdominal or pelvic lymph nodes.  Reproductive: Prostate is unremarkable. Other: No abdominal wall hernia or abnormality. No abdominopelvic ascites. Musculoskeletal: No acute or significant osseous findings. IMPRESSION: 1. Progression of hepatic metastatic disease. 2. Otherwise no acute abdominal or pelvic pathology identified. 3. Aortic atherosclerosis. Electronically Signed   By: Layla Maw M.D.   On: 12/06/2023 20:02    Pertinent labs & imaging results that were available during my care of the patient were reviewed by me and considered in my medical decision making (see MDM for details).  Medications Ordered  in ED Medications  droperidol (INAPSINE) 2.5 MG/ML injection 2.5 mg (2.5 mg Intravenous Given 12/06/23 1647)  sodium chloride 0.9 % bolus 1,000 mL (0 mLs Intravenous Stopped 12/06/23 1911)  iohexol (OMNIPAQUE) 300 MG/ML solution 100 mL (100 mLs Intravenous Contrast Given 12/06/23 1737)                                                                                                                                     Procedures Procedures  (including critical care time)  Medical Decision Making / ED Course   This patient presents to the ED for concern of abdominal pain, nausea and constipation, this involves an extensive number of treatment options, and is a complaint that carries with it a high risk of complications and morbidity.  The differential diagnosis includes nausea related to chemotherapy, bowel obstruction, biliary obstruction, less likely SBP,  MDM:  48 year old male here today with nausea, abdominal pain.  History of colon cancer with mets to the liver.  Abdomen soft, afebrile.  My suspicion that the patient's symptoms are likely related to his recent chemotherapy, had chemo this past week.  Labs obtained, do not show significant dehydration, no leukocytosis, no anemia.  Patient responded well to droperidol.  His CT imaging did not show acute intra-abdominal process.  P.o. challenge the patient here in the ED and he did well.  Will discharge with prescription for Reglan.  He will follow-up with his oncologist.  Additional history obtained: -Additional history obtained from  -External records from outside source obtained and reviewed including: Chart review including previous notes, labs, imaging, consultation notes   Lab Tests: -I ordered, reviewed, and interpreted labs.   The pertinent results include:   Labs Reviewed  CBC WITH DIFFERENTIAL/PLATELET - Abnormal; Notable for the following components:      Result Value   Platelets 125 (*)    All other components within normal  limits  COMPREHENSIVE METABOLIC PANEL WITH GFR - Abnormal; Notable for the following components:   Sodium 134 (*)    AST 56 (*)    ALT 121 (*)    Alkaline Phosphatase 234 (*)    All other components within normal limits  LIPASE, BLOOD      EKG   EKG Interpretation Date/Time:    Ventricular Rate:    PR Interval:    QRS Duration:    QT Interval:    QTC  Calculation:   R Axis:      Text Interpretation:           Imaging Studies ordered: I ordered imaging studies including CT imaging the abdomen pelvis I independently visualized and interpreted imaging. I agree with the radiologist interpretation   Medicines ordered and prescription drug management: Meds ordered this encounter  Medications   droperidol (INAPSINE) 2.5 MG/ML injection 2.5 mg   sodium chloride 0.9 % bolus 1,000 mL   iohexol (OMNIPAQUE) 300 MG/ML solution 100 mL   metoCLOPramide (REGLAN) 10 MG tablet    Sig: Take 1 tablet (10 mg total) by mouth every 6 (six) hours.    Dispense:  30 tablet    Refill:  0    -I have reviewed the patients home medicines and have made adjustments as needed  Cardiac Monitoring: The patient was maintained on a cardiac monitor.  I personally viewed and interpreted the cardiac monitored which showed an underlying rhythm of: Sinus rhythm  Social Determinants of Health:  Factors impacting patients care include: Multiple medical comorbidities including metastatic cancer   Reevaluation: After the interventions noted above, I reevaluated the patient and found that they have :improved  Co morbidities that complicate the patient evaluation  Past Medical History:  Diagnosis Date   Cancer (HCC)    Esophageal ulcer 2007   Renal disorder    CKD 10/28/20 "doesn't have it now"      Dispostion: I considered admission for this patient, however patient improved with symptomatic management.     Final Clinical Impression(s) / ED Diagnoses Final diagnoses:  Nausea and vomiting,  unspecified vomiting type     @PCDICTATION @    Anders Simmonds T, DO 12/06/23 2112

## 2023-12-06 NOTE — ED Triage Notes (Signed)
 Cancer patient , persistent nausea , home meds no relief . Constipation x 3 days . Last treatment  1 week  ago .

## 2023-12-06 NOTE — Discharge Instructions (Addendum)
 While you are in the emergency room, you had blood work and a CT scan done that were normal.  I am sending with a prescription for medicine called Reglan.  This is a medicine that can help with nausea.  It is okay to take this with your other nausea medicines.  Please call your oncologist and your PCP this week for a follow-up appointment.  Return to the emergency room if you develop increasing pain in your abdomen, inability to eat or drink.

## 2023-12-07 ENCOUNTER — Other Ambulatory Visit (HOSPITAL_BASED_OUTPATIENT_CLINIC_OR_DEPARTMENT_OTHER): Payer: Self-pay

## 2023-12-13 ENCOUNTER — Telehealth: Payer: Self-pay

## 2023-12-13 ENCOUNTER — Other Ambulatory Visit (HOSPITAL_BASED_OUTPATIENT_CLINIC_OR_DEPARTMENT_OTHER): Payer: Self-pay

## 2023-12-13 DIAGNOSIS — C787 Secondary malignant neoplasm of liver and intrahepatic bile duct: Secondary | ICD-10-CM

## 2023-12-13 NOTE — Telephone Encounter (Signed)
 Pt requesting refill of Oxycodone 5 mg #60. Last refilled 11/26/23. Please advise for refills, thanks!

## 2023-12-14 ENCOUNTER — Other Ambulatory Visit (HOSPITAL_BASED_OUTPATIENT_CLINIC_OR_DEPARTMENT_OTHER): Payer: Self-pay

## 2023-12-14 ENCOUNTER — Other Ambulatory Visit: Payer: Self-pay | Admitting: Hematology & Oncology

## 2023-12-14 DIAGNOSIS — C189 Malignant neoplasm of colon, unspecified: Secondary | ICD-10-CM

## 2023-12-14 MED ORDER — OXYCODONE HCL 5 MG PO TABS
5.0000 mg | ORAL_TABLET | ORAL | 0 refills | Status: DC | PRN
  Filled 2023-12-14: qty 60, 10d supply, fill #0

## 2023-12-15 ENCOUNTER — Other Ambulatory Visit: Payer: Self-pay

## 2023-12-17 ENCOUNTER — Other Ambulatory Visit: Payer: Self-pay

## 2023-12-17 ENCOUNTER — Encounter: Payer: Self-pay | Admitting: *Deleted

## 2023-12-17 ENCOUNTER — Inpatient Hospital Stay

## 2023-12-17 ENCOUNTER — Encounter: Payer: Self-pay | Admitting: Hematology & Oncology

## 2023-12-17 ENCOUNTER — Inpatient Hospital Stay: Attending: Hematology & Oncology

## 2023-12-17 ENCOUNTER — Other Ambulatory Visit (HOSPITAL_BASED_OUTPATIENT_CLINIC_OR_DEPARTMENT_OTHER): Payer: Self-pay

## 2023-12-17 ENCOUNTER — Inpatient Hospital Stay (HOSPITAL_BASED_OUTPATIENT_CLINIC_OR_DEPARTMENT_OTHER): Admitting: Hematology & Oncology

## 2023-12-17 VITALS — BP 136/87 | HR 59 | Temp 97.9°F | Resp 18 | Ht 70.0 in | Wt 218.0 lb

## 2023-12-17 DIAGNOSIS — C787 Secondary malignant neoplasm of liver and intrahepatic bile duct: Secondary | ICD-10-CM | POA: Diagnosis present

## 2023-12-17 DIAGNOSIS — C189 Malignant neoplasm of colon, unspecified: Secondary | ICD-10-CM | POA: Diagnosis not present

## 2023-12-17 DIAGNOSIS — D509 Iron deficiency anemia, unspecified: Secondary | ICD-10-CM | POA: Insufficient documentation

## 2023-12-17 DIAGNOSIS — C187 Malignant neoplasm of sigmoid colon: Secondary | ICD-10-CM | POA: Insufficient documentation

## 2023-12-17 DIAGNOSIS — Z5112 Encounter for antineoplastic immunotherapy: Secondary | ICD-10-CM | POA: Insufficient documentation

## 2023-12-17 LAB — IRON AND IRON BINDING CAPACITY (CC-WL,HP ONLY)
Iron: 73 ug/dL (ref 45–182)
Saturation Ratios: 23 % (ref 17.9–39.5)
TIBC: 319 ug/dL (ref 250–450)
UIBC: 246 ug/dL (ref 117–376)

## 2023-12-17 LAB — CMP (CANCER CENTER ONLY)
ALT: 72 U/L — ABNORMAL HIGH (ref 0–44)
AST: 51 U/L — ABNORMAL HIGH (ref 15–41)
Albumin: 4 g/dL (ref 3.5–5.0)
Alkaline Phosphatase: 246 U/L — ABNORMAL HIGH (ref 38–126)
Anion gap: 8 (ref 5–15)
BUN: 16 mg/dL (ref 6–20)
CO2: 26 mmol/L (ref 22–32)
Calcium: 9.7 mg/dL (ref 8.9–10.3)
Chloride: 107 mmol/L (ref 98–111)
Creatinine: 1.15 mg/dL (ref 0.61–1.24)
GFR, Estimated: >60 mL/min (ref >60)
Glucose, Bld: 120 mg/dL — ABNORMAL HIGH (ref 70–99)
Potassium: 3.9 mmol/L (ref 3.5–5.1)
Sodium: 141 mmol/L (ref 135–145)
Total Bilirubin: 0.2 mg/dL (ref 0.0–1.2)
Total Protein: 6.8 g/dL (ref 6.5–8.1)

## 2023-12-17 LAB — CBC WITH DIFFERENTIAL (CANCER CENTER ONLY)
Abs Immature Granulocytes: 0 10*3/uL (ref 0.00–0.07)
Basophils Absolute: 0 10*3/uL (ref 0.0–0.1)
Basophils Relative: 1 %
Eosinophils Absolute: 0.2 10*3/uL (ref 0.0–0.5)
Eosinophils Relative: 5 %
HCT: 46 % (ref 39.0–52.0)
Hemoglobin: 15.7 g/dL (ref 13.0–17.0)
Immature Granulocytes: 0 %
Lymphocytes Relative: 40 %
Lymphs Abs: 1.7 10*3/uL (ref 0.7–4.0)
MCH: 30.4 pg (ref 26.0–34.0)
MCHC: 34.1 g/dL (ref 30.0–36.0)
MCV: 89.1 fL (ref 80.0–100.0)
Monocytes Absolute: 0.5 10*3/uL (ref 0.1–1.0)
Monocytes Relative: 11 %
Neutro Abs: 1.9 10*3/uL (ref 1.7–7.7)
Neutrophils Relative %: 43 %
Platelet Count: 164 10*3/uL (ref 150–400)
RBC: 5.16 MIL/uL (ref 4.22–5.81)
RDW: 13.1 % (ref 11.5–15.5)
WBC Count: 4.3 10*3/uL (ref 4.0–10.5)
nRBC: 0 % (ref 0.0–0.2)

## 2023-12-17 LAB — FERRITIN: Ferritin: 219 ng/mL (ref 24–336)

## 2023-12-17 LAB — CEA (ACCESS): CEA (CHCC): 21.54 ng/mL — ABNORMAL HIGH (ref 0.00–5.00)

## 2023-12-17 MED ORDER — DEXAMETHASONE SODIUM PHOSPHATE 10 MG/ML IJ SOLN
10.0000 mg | Freq: Once | INTRAMUSCULAR | Status: AC
Start: 2023-12-17 — End: 2023-12-17
  Administered 2023-12-17: 10 mg via INTRAVENOUS
  Filled 2023-12-17: qty 1

## 2023-12-17 MED ORDER — PALONOSETRON HCL INJECTION 0.25 MG/5ML
0.2500 mg | Freq: Once | INTRAVENOUS | Status: AC
  Administered 2023-12-17: 0.25 mg via INTRAVENOUS
  Filled 2023-12-17: qty 5

## 2023-12-17 MED ORDER — DIPHENHYDRAMINE HCL 25 MG PO CAPS
50.0000 mg | ORAL_CAPSULE | Freq: Once | ORAL | Status: AC
  Administered 2023-12-17: 50 mg via ORAL
  Filled 2023-12-17: qty 2

## 2023-12-17 MED ORDER — RIVAROXABAN 10 MG PO TABS
10.0000 mg | ORAL_TABLET | Freq: Every day | ORAL | 1 refills | Status: DC
  Filled 2023-12-17: qty 30, 30d supply, fill #0

## 2023-12-17 MED ORDER — DRONABINOL 5 MG PO CAPS
5.0000 mg | ORAL_CAPSULE | Freq: Two times a day (BID) | ORAL | 0 refills | Status: DC
  Filled 2023-12-17: qty 60, 30d supply, fill #0

## 2023-12-17 MED ORDER — ACETAMINOPHEN 325 MG PO TABS
650.0000 mg | ORAL_TABLET | Freq: Once | ORAL | Status: AC
  Administered 2023-12-17: 650 mg via ORAL
  Filled 2023-12-17: qty 2

## 2023-12-17 MED ORDER — SODIUM CHLORIDE 0.9 % IV SOLN
150.0000 mg | Freq: Once | INTRAVENOUS | Status: AC
  Administered 2023-12-17: 150 mg via INTRAVENOUS
  Filled 2023-12-17: qty 150

## 2023-12-17 MED ORDER — FAM-TRASTUZUMAB DERUXTECAN-NXKI CHEMO 100 MG IV SOLR
5.4000 mg/kg | Freq: Once | INTRAVENOUS | Status: AC
  Administered 2023-12-17: 500 mg via INTRAVENOUS
  Filled 2023-12-17: qty 25

## 2023-12-17 MED ORDER — DEXTROSE 5 % IV SOLN: INTRAVENOUS | Status: DC

## 2023-12-17 NOTE — Patient Instructions (Signed)
 [facility] - A DEPT OF [facility]  Discharge Instructions: Thank you for choosing [facility] to provide your oncology and hematology care.   If you have a lab appointment with the Cancer Center, please go directly to the Cancer Center and check in at the registration area.  Wear comfortable clothing and clothing appropriate for easy access to any Portacath or PICC line.   We strive to give you quality time with your provider. You may need to reschedule your appointment if you arrive late (15 or more minutes).  Arriving late affects you and other patients whose appointments are after yours.  Also, if you miss three or more appointments without notifying the office, you may be dismissed from the clinic at the provider's discretion.      For prescription refill requests, have your pharmacy contact our office and allow 72 hours for refills to be completed.    Today you received the following chemotherapy and/or immunotherapy agents Fam-Trastuzumab Deruxtecan      To help prevent nausea and vomiting after your treatment, we encourage you to take your nausea medication as directed.  BELOW ARE SYMPTOMS THAT SHOULD BE REPORTED IMMEDIATELY: *FEVER GREATER THAN 100.4 F (38 C) OR HIGHER *CHILLS OR SWEATING *NAUSEA AND VOMITING THAT IS NOT CONTROLLED WITH YOUR NAUSEA MEDICATION *UNUSUAL SHORTNESS OF BREATH *UNUSUAL BRUISING OR BLEEDING *URINARY PROBLEMS (pain or burning when urinating, or frequent urination) *BOWEL PROBLEMS (unusual diarrhea, constipation, pain near the anus) TENDERNESS IN MOUTH AND THROAT WITH OR WITHOUT PRESENCE OF ULCERS (sore throat, sores in mouth, or a toothache) UNUSUAL RASH, SWELLING OR PAIN  UNUSUAL VAGINAL DISCHARGE OR ITCHING   Items with * indicate a potential emergency and should be followed up as soon as possible or go to the Emergency Department if any problems should occur.  Please show the CHEMOTHERAPY ALERT CARD or  IMMUNOTHERAPY ALERT CARD at check-in to the Emergency Department and triage nurse. Should you have questions after your visit or need to cancel or reschedule your appointment, please contact [facility] - A DEPT OF [facility]  [phone] and follow the prompts.  Office hours are 8:00 a.m. to 4:30 p.m. Monday - Friday. Please note that voicemails left after 4:00 p.m. may not be returned until the following business day.  We are closed weekends and major holidays. You have access to a nurse at all times for urgent questions. Please call the main number to the clinic [phone] and follow the prompts.  For any non-urgent questions, you may also contact your provider using MyChart. We now offer e-Visits for anyone 70 and older to request care online for non-urgent symptoms. For details visit [email or web address].   Also download the MyChart app! Go to the app store, search "MyChart", open the app, select [place], and log in with your MyChart username and password.

## 2023-12-17 NOTE — Progress Notes (Signed)
 Hematology and Oncology Follow Up Visit  Richard Bowman [id number] [birth year] 48 y.o. 12/17/2023  Principle Diagnosis:  Metastatic adenocarcinoma of the colon-liver metastasis -- pMMR/MSI-low -- (+) KRAS/ ERBB2(+)       Iron deficiency anemia Current Therapy:   FOLFOXIRI - s/p cycle #11  - start on  -- 12/09/2022 --oxaliplatin  dropped after 04/19/2023 IV iron as indicated Xeloda /Avastin  -- s/p cycle #1 on 09/13/2023 --Avastin  on hold.  -Xeloda  d/c'ed on 10/15/2023 Enhertu  - s/p cycle #1 - start on 11/26/2023     Interim History:  Richard Bowman is back for follow-up.  Overall, he might be doing a little bit better.  He has had 1 cycle of Enhertu .  He says he does not have as much abdominal pain over on the right side.  He again is hard to say if this might be from the Enhertu  with some tumor improvement.  He unfortunately has some phlebitis in the right forearm.  This happened after he had an IV placed in the ER a week or so ago.  I will put him on low-dose Xarelto -10 mg p.o. daily-for about 4 weeks or so.  He actually will have the intrahepatic therapy next week.  Hopefully this will help us  with respect to his liver mets.  He is still smoking a little bit.  He is trying to cut back.  He has had no bleeding.  Has been no change in bowel or bladder habits.  He has had no cough.  Is been no chest wall pain.  He has had no leg swelling.  He has had no rashes.  There has been no swollen lymph nodes.  His last CEA back in 11/19/2023 was 27.3.  Currently, I would say his performance status is probably ECOG 1.   Wt Readings from Last 3 Encounters:  12/17/23 218 lb (98.9 kg)  11/19/23 223 lb (101.2 kg)  10/20/23 220 lb (99.8 kg)    Medications:  Current Outpatient Medications:    acetaminophen  (TYLENOL ) 500 MG tablet, Take 2 tablets (1,000 mg total) by mouth every 6 (six) hours as needed for mild pain., Disp: 30 tablet, Rfl: 0   dexamethasone  (DECADRON ) 4 MG tablet, Take 2 tablets (8 mg)  by mouth daily for 3 days starting the day after chemotherapy. Take with food., Disp: 30 tablet, Rfl: 1   docusate sodium  (COLACE) 100 MG capsule, Take 1 capsule (100 mg total) by mouth 2 (two) times daily. (Patient not taking: Reported on 10/20/2023), Disp: 10 capsule, Rfl: 0   gabapentin  (NEURONTIN ) 300 MG capsule, Take 1 capsule (300 mg total) by mouth 3 (three) times daily., Disp: 90 capsule, Rfl: 2   lactose free nutrition (BOOST) LIQD, Take 237 mLs by mouth daily as needed (Supplement)., Disp: , Rfl:    lactulose  (CHRONULAC ) 10 GM/15ML solution, Take 15 mLs (10 g total) by mouth 3 (three) times daily. (Patient not taking: Reported on 09/29/2023), Disp: 236 mL, Rfl: 0   lidocaine -prilocaine  (EMLA ) cream, Apply 1 Application topically as needed. (Patient not taking: Reported on 09/29/2023), Disp: 30 g, Rfl: 0   lidocaine -prilocaine  (EMLA ) cream, Apply to affected area once, Disp: 30 g, Rfl: 3   LORazepam  (ATIVAN ) 0.5 MG tablet, Place 1 tablet (0.5 mg total) under the tongue every 6 (six) hours as needed for anxiety., Disp: 60 tablet, Rfl: 0   methocarbamol  (ROBAXIN ) 500 MG tablet, Take 1 tablet (500 mg total) by mouth every 6 (six) hours as needed for muscle spasms., Disp: 30 tablet, Rfl: 0  metoCLOPramide  (REGLAN ) 10 MG tablet, Take 1 tablet (10 mg total) by mouth every 6 (six) hours., Disp: 30 tablet, Rfl: 0   nystatin  (MYCOSTATIN ) 100000 UNIT/ML suspension, Take 5 mLs (500,000 Units total) by mouth 4 (four) times daily., Disp: 473 mL, Rfl: 1   ondansetron  (ZOFRAN ) 8 MG tablet, Take 1 tablet (8 mg total) by mouth every 8 (eight) hours as needed for nausea or vomiting. Start on the third day after chemotherapy., Disp: 30 tablet, Rfl: 1   oxyCODONE  (OXY IR/ROXICODONE ) 5 MG immediate release tablet, Take 1 tablet (5 mg total) by mouth every 4 (four) hours as needed for severe pain (pain score 7-10)., Disp: 60 tablet, Rfl: 0   pantoprazole  (PROTONIX ) 40 MG tablet, Take 1 tablet (40 mg total) by mouth 2  (two) times daily., Disp: 60 tablet, Rfl: 4   polyethylene glycol (MIRALAX  / GLYCOLAX ) 17 g packet, Take 17 g by mouth daily as needed for mild constipation. (Patient not taking: Reported on 09/29/2023), Disp: 14 each, Rfl: 0   prochlorperazine  (COMPAZINE ) 10 MG tablet, Take 1 tablet (10 mg total) by mouth every 6 (six) hours as needed for nausea or vomiting., Disp: 30 tablet, Rfl: 1  Allergies:  Allergies  Allergen Reactions   Xeloda  [Capecitabine ] Nausea And Vomiting and Other (See Comments)    Chest pain    Past Medical History, Surgical history, Social history, and Family History were reviewed and updated.  Review of Systems: Review of Systems  Constitutional: Negative.   HENT:  Negative.  Negative for hearing loss.   Eyes: Negative.  Negative for eye problems.  Respiratory: Negative.    Cardiovascular: Negative.   Gastrointestinal:  Positive for abdominal pain. Negative for constipation.  Endocrine: Negative.   Genitourinary: Negative.    Musculoskeletal: Negative.   Skin: Negative.   Neurological: Negative.        Numbness and tingling of hands and feet  Hematological: Negative.   Psychiatric/Behavioral: Negative.      Physical Exam: Vitals:   12/17/23 1000  BP: 136/87  Pulse: (!) 59  Resp: 18  Temp: 97.9 F (36.6 C)  SpO2: 99%   Wt Readings from Last 3 Encounters:  12/17/23 218 lb (98.9 kg)  11/19/23 223 lb (101.2 kg)  10/20/23 220 lb (99.8 kg)    Physical Exam Vitals reviewed.  HENT:     Head: Normocephalic and atraumatic.  Eyes:     Pupils: Pupils are equal, round, and reactive to light.  Cardiovascular:     Rate and Rhythm: Normal rate and regular rhythm.     Heart sounds: Normal heart sounds.  Pulmonary:     Effort: Pulmonary effort is normal.     Breath sounds: Normal breath sounds.  Abdominal:     General: Bowel sounds are normal.     Palpations: Abdomen is soft.     Comments: Abdominal exam shows a well-healed laparotomy scar.  He has no  fluid wave.  There is no guarding or rebound tenderness.    Musculoskeletal:        General: No tenderness or deformity. Normal range of motion.     Cervical back: Normal range of motion.     Comments: Extremities shows some phlebitis in the flexor aspect of the right forearm.  There is a venous cord that is palpable.  Lymphadenopathy:     Cervical: No cervical adenopathy.  Skin:    General: Skin is warm and dry.     Findings: No erythema or rash.  Neurological:  Mental Status: He is alert and oriented to person, place, and time.  Psychiatric:        Behavior: Behavior normal.        Thought Content: Thought content normal.        Judgment: Judgment normal.      Lab Results  Component Value Date   WBC 4.3 12/17/2023   HGB 15.7 12/17/2023   HCT 46.0 12/17/2023   MCV 89.1 12/17/2023   PLT 164 12/17/2023     Chemistry      Component Value Date/Time   NA 141 12/17/2023 1001   K 3.9 12/17/2023 1001   CL 107 12/17/2023 1001   CO2 26 12/17/2023 1001   BUN 16 12/17/2023 1001   CREATININE 1.15 12/17/2023 1001      Component Value Date/Time   CALCIUM  9.7 12/17/2023 1001   ALKPHOS 246 (H) 12/17/2023 1001   AST 51 (H) 12/17/2023 1001   ALT 72 (H) 12/17/2023 1001   BILITOT 0.2 12/17/2023 1001      Impression and Plan:  Richard Bowman is a very nice 48 year old white male.  He was found to have metastatic colon cancer.  Currently, he is on Enhertu .  He will then have intrahepatic therapy done next week.  Hopefully, we can attack the liver mets into different ways.  I am happy that he is feeling a little bit better.  His weight is down a little bit.  I do worry about this.  I think that Richard Bowman  (5 mg p.o. twice daily) might help.  We will plan to get him back to see us  in another 3 weeks.  If he is hospitalized after the intrahepatic therapy, sure I will see him there.    Maude JONELLE Crease, MD 4/18/202510:45 AM

## 2023-12-17 NOTE — Progress Notes (Signed)
 Patient is here for next cycle. He is feeling somewhat better with decreased liver pain. He is scheduled for TACE on 12/23/2023.He will proceed with cycle two.   Oncology Nurse Navigator Documentation     12/17/2023   10:15 AM  Oncology Nurse Navigator Flowsheets  Navigator Follow Up Date: 01/07/2024  Navigator Follow Up Reason: Follow-up Appointment;Chemotherapy  Navigator Location [facility]  Navigator Encounter Type Treatment  Patient Visit Type MedOnc  Treatment Phase Active Tx  Barriers/Navigation Needs Coordination of Care  Interventions Psycho-Social Support  Acuity Level 1-No Barriers  Time Spent with Patient 15

## 2023-12-17 NOTE — Progress Notes (Signed)
 Per Dr. Maria Shiner note Xeloda  d/c'd 2/14, disenrolled.

## 2023-12-17 NOTE — Progress Notes (Signed)
 Ok to proceed with Enhertu  3 days early, as pt's counts are adequate per Dr. Maria Shiner.  Wakeelah Solan, PharmD, MBA

## 2023-12-20 ENCOUNTER — Other Ambulatory Visit

## 2023-12-20 ENCOUNTER — Ambulatory Visit: Admitting: Hematology & Oncology

## 2023-12-20 ENCOUNTER — Other Ambulatory Visit (HOSPITAL_BASED_OUTPATIENT_CLINIC_OR_DEPARTMENT_OTHER): Payer: Self-pay

## 2023-12-20 ENCOUNTER — Encounter: Payer: Self-pay | Admitting: Hematology & Oncology

## 2023-12-20 MED ORDER — LC BEADS 300-500UM IN SALINE
150.0000 mg | Status: AC
  Administered 2023-12-23: 150 mg via INTRA_ARTERIAL
  Filled 2023-12-20: qty 50

## 2023-12-21 ENCOUNTER — Other Ambulatory Visit: Payer: Self-pay

## 2023-12-21 ENCOUNTER — Other Ambulatory Visit (HOSPITAL_BASED_OUTPATIENT_CLINIC_OR_DEPARTMENT_OTHER): Payer: Self-pay

## 2023-12-22 ENCOUNTER — Other Ambulatory Visit: Payer: Self-pay | Admitting: Physician Assistant

## 2023-12-22 DIAGNOSIS — Z01818 Encounter for other preprocedural examination: Secondary | ICD-10-CM

## 2023-12-22 NOTE — H&P (Signed)
 Chief Complaint: Patient was seen in consultation today for metastatic liver lesion, with consideration for DEB-TACE.  Referring Provider(s):   PCP; Senaida Dama, NP Hematology Oncology; Ivor Mars MD   Supervising Physician: Art Largo  Patient Status: [facility] - Out-pt  Patient is Full Code  History of Present Illness: Richard Bowman is a 48 y.o. male  with PMHx notable for tobacco abuse and colon cancer.  Per Dr. Phebe Brasil consult note dated 3/20: "diagnosed with metastatic colon CA on 11/17/22 after he presented to ER w abdominal pain. Investigations including CT CAP was revealing for hepatic metastases w suspected primary malignancy at the sigmoid colon. Diagnosis confirmed on colonoscopy. He is known to VIR s/p port placement (12/14/22, [facility]) and more recently removal (10/28/23, Mabel Savage). He is closely followed by medical oncology and Dr Maria Shiner, with treatments including chemoRx with FOLFOXIRI q14 days, s/p 12 cycles completed (4/17 - 08/27/23). Pt recently on bevacizumab  (q21 days and began on 09/13/23), though reportedly completed a single treatment and cycle 2 deferred secondary to RUQ discomfort.    Given his disease burden in liver, locoregional therapy was recommended and he presents for evaluation. He does not seem to have a good understanding of his disease. I discussed his diagnosis and current treatment options, stressing that my offered treatment is not curative and may help control his disease burden.  [...] He is interested in pursuing a minimally-invasive option for tumor debulking and locoregional control, and we discussed transarterial chemoembolization (TACE). "  Patient has agreed to proceed with DEB-TACE. Patient is scheduled for same in IR today.   Patient is currently without any significant complaints. Patient is alert and laying in bed, calm. Patient denies any fevers, headache, chest pain, SOB, cough, abdominal pain, nausea, vomiting or bleeding.      Past Medical History:  Diagnosis Date   Cancer [facility])    Esophageal ulcer 2007   Renal disorder    CKD 10/28/20 "doesn't have it now"    Past Surgical History:  Procedure Laterality Date   BIOPSY  11/17/2022   Procedure: BIOPSY;  Surgeon: Sergio Dandy, MD;  Location: [facility] ENDOSCOPY;  Service: Gastroenterology;;   COLON RESECTION SIGMOID N/A 11/18/2022   Procedure: OPEN SIGMOID COLECTOMY;  Surgeon: Dareen Ebbing, MD;  Location: [facility] OR;  Service: General;  Laterality: N/A;   COLONOSCOPY WITH PROPOFOL  N/A 11/17/2022   Procedure: COLONOSCOPY WITH PROPOFOL ;  Surgeon: Sergio Dandy, MD;  Location: [facility] ENDOSCOPY;  Service: Gastroenterology;  Laterality: N/A;   ESOPHAGOGASTRODUODENOSCOPY (EGD) WITH PROPOFOL  N/A 11/17/2022   Procedure: ESOPHAGOGASTRODUODENOSCOPY (EGD) WITH PROPOFOL ;  Surgeon: Sergio Dandy, MD;  Location: [facility] ENDOSCOPY;  Service: Gastroenterology;  Laterality: N/A;   IR IMAGING GUIDED PORT INSERTION  12/14/2022   IR RADIOLOGIST EVAL & MGMT  11/18/2023   IR REMOVAL TUN ACCESS W/ PORT W/O FL MOD SED  10/28/2023   LIVER BIOPSY N/A 11/18/2022   Procedure: LIVER BIOPSY;  Surgeon: Dareen Ebbing, MD;  Location: [facility] OR;  Service: General;  Laterality: N/A;   SUBMUCOSAL TATTOO INJECTION  11/17/2022   Procedure: SUBMUCOSAL TATTOO INJECTION;  Surgeon: Nandigam, Kavitha V, MD;  Location: [facility] ENDOSCOPY;  Service: Gastroenterology;;    Allergies: Xeloda  [capecitabine ]  Medications: Prior to Admission medications   Medication Sig Start Date End Date Taking? Authorizing Provider  acetaminophen  (TYLENOL ) 500 MG tablet Take 2 tablets (1,000 mg total) by mouth every 6 (six) hours as needed for mild pain. 11/22/22   Meuth, Quillian Brunt, PA-C  dexamethasone  (  DECADRON ) 4 MG tablet Take 2 tablets (8 mg) by mouth daily for 3 days starting the day after chemotherapy. Take with food. 11/29/23   Ivor Mars, MD  docusate sodium  (COLACE) 100 MG capsule Take 1 capsule (100 mg total) by mouth  2 (two) times daily. Patient not taking: Reported on 10/20/2023 11/23/22   Akula, Vijaya, MD  dronabinol  (MARINOL ) 5 MG capsule Take 1 capsule (5 mg total) by mouth 2 (two) times daily before a meal. 12/17/23   Ennever, Sherryll Donald, MD  gabapentin  (NEURONTIN ) 300 MG capsule Take 1 capsule (300 mg total) by mouth 3 (three) times daily. 09/17/23 12/16/23  Ivor Mars, MD  lactose free nutrition (BOOST) LIQD Take 237 mLs by mouth daily as needed (Supplement).    [provider]  lactulose  (CHRONULAC ) 10 GM/15ML solution Take 15 mLs (10 g total) by mouth 3 (three) times daily. Patient not taking: Reported on 09/29/2023 12/04/22   Ivor Mars, MD  lidocaine -prilocaine  (EMLA ) cream Apply 1 Application topically as needed. Patient not taking: Reported on 12/17/2023 12/21/22   Ivor Mars, MD  lidocaine -prilocaine  (EMLA ) cream Apply to affected area once Patient not taking: Reported on 12/17/2023 11/29/23   Ivor Mars, MD  LORazepam  (ATIVAN ) 0.5 MG tablet Place 1 tablet (0.5 mg total) under the tongue every 6 (six) hours as needed for anxiety. 04/21/23   Ivor Mars, MD  methocarbamol  (ROBAXIN ) 500 MG tablet Take 1 tablet (500 mg total) by mouth every 6 (six) hours as needed for muscle spasms. 02/24/23   Ivor Mars, MD  metoCLOPramide  (REGLAN ) 10 MG tablet Take 1 tablet (10 mg total) by mouth every 6 (six) hours. 12/06/23   Afton Horse T, DO  nystatin  (MYCOSTATIN ) 100000 UNIT/ML suspension Take 5 mLs (500,000 Units total) by mouth 4 (four) times daily. 12/16/22   Tish Forge, NP  ondansetron  (ZOFRAN ) 8 MG tablet Take 1 tablet (8 mg total) by mouth every 8 (eight) hours as needed for nausea or vomiting. Start on the third day after chemotherapy. 11/29/23   Ivor Mars, MD  oxyCODONE  (OXY IR/ROXICODONE ) 5 MG immediate release tablet Take 1 tablet (5 mg total) by mouth every 4 (four) hours as needed for severe pain (pain score 7-10). 12/14/23   Ivor Mars, MD  pantoprazole   (PROTONIX ) 40 MG tablet Take 1 tablet (40 mg total) by mouth 2 (two) times daily. 10/04/23   Ivor Mars, MD  polyethylene glycol (MIRALAX  / GLYCOLAX ) 17 g packet Take 17 g by mouth daily as needed for mild constipation. Patient not taking: Reported on 09/29/2023 11/23/22   Akula, Vijaya, MD  prochlorperazine  (COMPAZINE ) 10 MG tablet Take 1 tablet (10 mg total) by mouth every 6 (six) hours as needed for nausea or vomiting. 11/29/23   Ivor Mars, MD  rivaroxaban  (XARELTO ) 10 MG TABS tablet Take 1 tablet (10 mg total) by mouth daily. 12/17/23   Ivor Mars, MD     Family History  Problem Relation Age of Onset   COPD Mother    Cancer Mother     Social History   Socioeconomic History   Marital status: Single    Spouse name: Not on file   Number of children: 0   Years of education: Not on file   Highest education level: Associate degree: occupational, Scientist, product/process development, or vocational program  Occupational History    Comment: NA   Occupation: Curator  Tobacco Use   Smoking status: Every Day  Current packs/day: 1.00    Average packs/day: 1 pack/day for 26.0 years (26.0 ttl pk-yrs)    Types: Cigarettes    Passive exposure: Current   Smokeless tobacco: Never   Tobacco comments:    Smoking half a pack/day  Vaping Use   Vaping status: Former  Substance and Sexual Activity   Alcohol use: Yes    Alcohol/week: 0.0 standard drinks of alcohol    Comment: occ   Drug use: No   Sexual activity: Not Currently  Other Topics Concern   Not on file  Social History Narrative   Not on file   Social Drivers of Health   Financial Resource Strain: Medium Risk (12/02/2022)   Overall Financial Resource Strain (CARDIA)    Difficulty of Paying Living Expenses: Somewhat hard  Food Insecurity: Patient Declined (12/20/2022)   Hunger Vital Sign    Worried About Running Out of Food in the Last Year: Patient declined    Ran Out of Food in the Last Year: Patient declined  Transportation Needs: No  Transportation Needs (12/20/2022)   PRAPARE - Administrator, Civil Service (Medical): No    Lack of Transportation (Non-Medical): No  Physical Activity: Not on file  Stress: Not on file  Social Connections: Not on file     Review of Systems: A 12 point ROS discussed and pertinent positives are indicated in the HPI above.  All other systems are negative.  Vital Signs: BP (!) 125/93   Pulse 69   Temp (!) 97.5 F (36.4 C) (Oral)   Resp 18   Ht 5\' 10"  (1.778 m)   Wt 218 lb 0.6 oz (98.9 kg)   SpO2 98%   BMI 31.28 kg/m   Advance Care Plan: The advanced care place/surrogate decision maker was discussed at the time of visit and the patient did not wish to discuss or was not able to name a surrogate decision maker or provide an advance care plan.  Physical Exam Vitals reviewed.  Constitutional:      General: He is not in acute distress.    Appearance: Normal appearance.  HENT:     Mouth/Throat:     Mouth: Mucous membranes are moist.  Cardiovascular:     Rate and Rhythm: Normal rate and regular rhythm.     Pulses: Normal pulses.     Heart sounds: No murmur heard. Pulmonary:     Effort: Pulmonary effort is normal. No respiratory distress.     Breath sounds: Normal breath sounds.  Abdominal:     General: Abdomen is flat.     Tenderness: There is no abdominal tenderness.  Musculoskeletal:        General: Normal range of motion.     Cervical back: Normal range of motion.  Skin:    General: Skin is warm and dry.  Neurological:     Mental Status: He is alert and oriented to person, place, and time.  Psychiatric:        Mood and Affect: Mood normal.        Behavior: Behavior normal.        Thought Content: Thought content normal.        Judgment: Judgment normal.     Imaging: CT ABDOMEN PELVIS W CONTRAST Result Date: 12/06/2023 CLINICAL DATA:  Bowel obstruction, vomiting.  Colon cancer. EXAM: CT ABDOMEN AND PELVIS WITH CONTRAST TECHNIQUE: Multidetector CT imaging  of the abdomen and pelvis was performed using the standard protocol following bolus administration of intravenous contrast.  RADIATION DOSE REDUCTION: This exam was performed according to the departmental dose-optimization program which includes automated exposure control, adjustment of the mA and/or kV according to patient size and/or use of iterative reconstruction technique. CONTRAST:  100mL OMNIPAQUE  IOHEXOL  300 MG/ML SOLN and oral contrast. COMPARISON:  10/25/2023. FINDINGS: Lower chest: Dependent basilar subsegmental atelectasis. No pleural or pericardial effusions. Hepatobiliary: Multiple hepatic lesions consistent with metastatic disease with definite interval progression. For example 1 lesion in the right lobe previously measured 3.3 cm and now measures 3.7 cm. Additional lesions are seen which have also increased in size. No biliary ductal dilatation. Unremarkable gallbladder. Pancreas: Unremarkable. No pancreatic ductal dilatation or surrounding inflammatory changes. Spleen: Normal in size without focal abnormality. Adrenals/Urinary Tract: Adrenal glands are unremarkable. Kidneys are normal, without renal calculi, focal lesion, or hydronephrosis. Bladder is unremarkable. CT Stomach/Bowel: Stomach is within normal limits. Appendix not discretely visualized. No evidence of bowel wall thickening, distention, or inflammatory changes. Rectosigmoid anastomosis. Vascular/Lymphatic: Aortic atherosclerosis. No enlarged abdominal or pelvic lymph nodes. Reproductive: Prostate is unremarkable. Other: No abdominal wall hernia or abnormality. No abdominopelvic ascites. Musculoskeletal: No acute or significant osseous findings. IMPRESSION: 1. Progression of hepatic metastatic disease. 2. Otherwise no acute abdominal or pelvic pathology identified. 3. Aortic atherosclerosis. Electronically Signed   By: Sydell Eva M.D.   On: 12/06/2023 20:02    Labs:  CBC: Recent Labs    11/26/23 1108 12/06/23 1642  12/17/23 1001 12/23/23 1000  WBC 10.2 9.4 4.3 4.9  HGB 17.5* 16.2 15.7 14.5  HCT 51.2 46.4 46.0 43.4  PLT 168 125* 164 110*    COAGS: Recent Labs    12/23/23 1000  INR 0.9    BMP: Recent Labs    11/26/23 1108 12/06/23 1642 12/17/23 1001 12/23/23 1000  NA 139 134* 141 138  K 4.2 4.3 3.9 3.9  CL 103 100 107 106  CO2 26 24 26 26   GLUCOSE 113* 92 120* 85  BUN 18 20 16  28*  CALCIUM  9.8 9.2 9.7 8.7*  CREATININE 1.11 0.99 1.15 1.15  GFRNONAA >60 >60 >60 >60    LIVER FUNCTION TESTS: Recent Labs    11/26/23 1108 12/06/23 1642 12/17/23 1001 12/23/23 1000  BILITOT 0.9 0.9 0.2 0.4  AST 52* 56* 51* 67*  ALT 73* 121* 72* 126*  ALKPHOS 215* 234* 246* 182*  PROT 7.4 7.3 6.8 6.1*  ALBUMIN  4.5 3.6 4.0 3.0*    TUMOR MARKERS: Recent Labs    09/29/23 1020 11/19/23 1152 12/17/23 1002  CEA 9.57* 27.27* 21.54*    Assessment and Plan: Per Dr. Phebe Brasil consult note dated 3/20: "*CT AP already performed and reviewed. No additional imaging required. *Pt on q21 combination therapy including bevacizumab  / Avastin . Request continued hold to decrease risk of vascular complications.  *OK proceed to schedule DEB-TACE based on mutual availability. *Procedure to be performed at [facility]. Will plan for trans-radial access. *Same day procedure, no overnight admission. *Will obtain MELD labs (CBC, CMP, INR) on procedure day arrival."  All labs and medications are within acceptable parameters. No pertinent allergies. Patient has been NPO since midnight.   Patient presents for scheduled DEB-TACE in IR today.  Risks and benefits of DEB-TACE were discussed with the patient including, but not limited to, bleeding, infection, vascular injury, contrast induced renal failure, post procedural pain, nausea, vomiting, fatigue, progression of liver failure, chemical cholecystitis, or need for additional procedures.  This interventional procedure involves the use of X-rays and because of the nature  of the planned procedure,  it is possible that we will have prolonged use of X-ray fluoroscopy.  Potential radiation risks to you include (but are not limited to) the following: - A slightly elevated risk for cancer several years later in life. This risk is typically less than 0.5% percent. This risk is low in comparison to the normal incidence of human cancer, which is 33% for women and 50% for men according to the [employer] Society. - Radiation induced injury can include skin redness, resembling a rash, tissue breakdown / ulcers and hair loss (which can be temporary or permanent).   The likelihood of either of these occurring depends on the difficulty of the procedure and whether you are sensitive to radiation due to previous procedures, disease, or genetic conditions.   IF your procedure requires a prolonged use of radiation, you will be notified and given written instructions for further action.  It is your responsibility to monitor the irradiated area for the 2 weeks following the procedure and to notify your physician if you are concerned that you have suffered a radiation induced injury.    All of the patient's questions were answered, patient is agreeable to proceed.  Consent signed and in chart.      Thank you for allowing our service to participate in Richard Bowman 's care.  Electronically Signed: Lovena Rubinstein, PA-C   12/23/2023, 11:02 AM      I spent a total of  25 Minutes in face to face in clinical consultation, greater than 50% of which was counseling/coordinating care for metastatic liver lesion, with consideration for DEB-TACE.

## 2023-12-23 ENCOUNTER — Other Ambulatory Visit (HOSPITAL_COMMUNITY): Payer: Self-pay | Admitting: Interventional Radiology

## 2023-12-23 ENCOUNTER — Other Ambulatory Visit (HOSPITAL_COMMUNITY): Payer: Self-pay

## 2023-12-23 ENCOUNTER — Other Ambulatory Visit: Payer: Self-pay

## 2023-12-23 ENCOUNTER — Other Ambulatory Visit: Payer: Self-pay | Admitting: Interventional Radiology

## 2023-12-23 ENCOUNTER — Ambulatory Visit (HOSPITAL_COMMUNITY)
Admission: RE | Admit: 2023-12-23 | Discharge: 2023-12-23 | Disposition: A | Discharge status: Home / Self Care | Source: Ambulatory Visit | Attending: Interventional Radiology | Admitting: Interventional Radiology

## 2023-12-23 ENCOUNTER — Encounter (HOSPITAL_COMMUNITY): Payer: Self-pay

## 2023-12-23 DIAGNOSIS — F17210 Nicotine dependence, cigarettes, uncomplicated: Secondary | ICD-10-CM | POA: Diagnosis not present

## 2023-12-23 DIAGNOSIS — C787 Secondary malignant neoplasm of liver and intrahepatic bile duct: Secondary | ICD-10-CM

## 2023-12-23 DIAGNOSIS — C189 Malignant neoplasm of colon, unspecified: Secondary | ICD-10-CM | POA: Insufficient documentation

## 2023-12-23 DIAGNOSIS — Z01818 Encounter for other preprocedural examination: Secondary | ICD-10-CM

## 2023-12-23 HISTORY — PX: IR US GUIDE VASC ACCESS LEFT: IMG2389

## 2023-12-23 HISTORY — PX: IR 3D INDEPENDENT WKST: IMG2385

## 2023-12-23 HISTORY — PX: IR ANGIOGRAM SELECTIVE EACH ADDITIONAL VESSEL: IMG667

## 2023-12-23 HISTORY — PX: IR EMBO TUMOR ORGAN ISCHEMIA INFARCT INC GUIDE ROADMAPPING: IMG5449

## 2023-12-23 HISTORY — PX: IR ANGIOGRAM VISCERAL SELECTIVE: IMG657

## 2023-12-23 LAB — PROTIME-INR
INR: 0.9 (ref 0.8–1.2)
Prothrombin Time: 12.4 s (ref 11.4–15.2)

## 2023-12-23 LAB — COMPREHENSIVE METABOLIC PANEL WITH GFR
ALT: 126 U/L — ABNORMAL HIGH (ref 0–44)
AST: 67 U/L — ABNORMAL HIGH (ref 15–41)
Albumin: 3 g/dL — ABNORMAL LOW (ref 3.5–5.0)
Alkaline Phosphatase: 182 U/L — ABNORMAL HIGH (ref 38–126)
Anion gap: 6 (ref 5–15)
BUN: 28 mg/dL — ABNORMAL HIGH (ref 6–20)
CO2: 26 mmol/L (ref 22–32)
Calcium: 8.7 mg/dL — ABNORMAL LOW (ref 8.9–10.3)
Chloride: 106 mmol/L (ref 98–111)
Creatinine, Ser: 1.15 mg/dL (ref 0.61–1.24)
GFR, Estimated: >60 mL/min (ref >60)
Glucose, Bld: 85 mg/dL (ref 70–99)
Potassium: 3.9 mmol/L (ref 3.5–5.1)
Sodium: 138 mmol/L (ref 135–145)
Total Bilirubin: 0.4 mg/dL (ref 0.0–1.2)
Total Protein: 6.1 g/dL — ABNORMAL LOW (ref 6.5–8.1)

## 2023-12-23 LAB — CBC
HCT: 43.4 % (ref 39.0–52.0)
Hemoglobin: 14.5 g/dL (ref 13.0–17.0)
MCH: 29.7 pg (ref 26.0–34.0)
MCHC: 33.4 g/dL (ref 30.0–36.0)
MCV: 88.8 fL (ref 80.0–100.0)
Platelets: 110 10*3/uL — ABNORMAL LOW (ref 150–400)
RBC: 4.89 MIL/uL (ref 4.22–5.81)
RDW: 13.1 % (ref 11.5–15.5)
WBC: 4.9 10*3/uL (ref 4.0–10.5)
nRBC: 0 % (ref 0.0–0.2)

## 2023-12-23 MED ORDER — FENTANYL CITRATE (PF) 100 MCG/2ML IJ SOLN
INTRAMUSCULAR | Status: AC | PRN
  Administered 2023-12-23: 50 ug via INTRAVENOUS

## 2023-12-23 MED ORDER — FENTANYL CITRATE (PF) 100 MCG/2ML IJ SOLN
INTRAMUSCULAR | Status: AC | PRN
  Administered 2023-12-23: 25 ug via INTRAVENOUS

## 2023-12-23 MED ORDER — ONDANSETRON 4 MG PO TBDP
4.0000 mg | ORAL_TABLET | Freq: Three times a day (TID) | ORAL | 0 refills | Status: DC | PRN
  Filled 2023-12-23: qty 20, 7d supply, fill #0

## 2023-12-23 MED ORDER — MIDAZOLAM HCL 2 MG/2ML IJ SOLN
INTRAMUSCULAR | Status: AC | PRN
  Administered 2023-12-23: 1 mg via INTRAVENOUS

## 2023-12-23 MED ORDER — LIDOCAINE HCL (PF) 1 % IJ SOLN
30.0000 mL | Freq: Once | INTRAMUSCULAR | Status: AC
  Administered 2023-12-23: 3 mL via INTRADERMAL

## 2023-12-23 MED ORDER — HYDRALAZINE HCL 20 MG/ML IJ SOLN
INTRAMUSCULAR | Status: AC
  Filled 2023-12-23: qty 1

## 2023-12-23 MED ORDER — SODIUM CHLORIDE 0.9 % IV SOLN: INTRAVENOUS | Status: DC

## 2023-12-23 MED ORDER — SODIUM CHLORIDE 0.9% FLUSH: 3.0000 mL | INTRAVENOUS | Status: DC | PRN

## 2023-12-23 MED ORDER — LIDOCAINE HCL (PF) 1 % IJ SOLN
INTRAMUSCULAR | Status: AC
  Filled 2023-12-23: qty 30

## 2023-12-23 MED ORDER — ACETAMINOPHEN 325 MG PO TABS: 650.0000 mg | ORAL_TABLET | ORAL | Status: DC | PRN

## 2023-12-23 MED ORDER — ONDANSETRON HCL 4 MG/2ML IJ SOLN
4.0000 mg | Freq: Four times a day (QID) | INTRAMUSCULAR | Status: DC | PRN
  Administered 2023-12-23: 4 mg via INTRAVENOUS
  Filled 2023-12-23: qty 2

## 2023-12-23 MED ORDER — OXYCODONE HCL 5 MG PO TABS
10.0000 mg | ORAL_TABLET | ORAL | Status: DC | PRN
  Administered 2023-12-23: 10 mg via ORAL
  Filled 2023-12-23: qty 2

## 2023-12-23 MED ORDER — HYDRALAZINE HCL 20 MG/ML IJ SOLN
INTRAMUSCULAR | Status: AC | PRN
  Administered 2023-12-23: 10 mg via INTRAVENOUS

## 2023-12-23 MED ORDER — VERAPAMIL HCL 2.5 MG/ML IV SOLN
INTRAVENOUS | Status: AC
  Filled 2023-12-23: qty 2

## 2023-12-23 MED ORDER — SODIUM CHLORIDE 0.9 % IV SOLN
12.5000 mg | Freq: Four times a day (QID) | INTRAVENOUS | Status: AC | PRN
  Administered 2023-12-23: 12.5 mg via INTRAVENOUS
  Filled 2023-12-23: qty 12.5

## 2023-12-23 MED ORDER — SODIUM CHLORIDE 0.9 % IV SOLN
8.0000 mg | Freq: Once | INTRAVENOUS | Status: DC
  Filled 2023-12-23: qty 4

## 2023-12-23 MED ORDER — NITROGLYCERIN IN D5W 100-5 MCG/ML-% IV SOLN
INTRAVENOUS | Status: AC
Start: 2023-12-23 — End: ?
  Filled 2023-12-23: qty 250

## 2023-12-23 MED ORDER — MIDAZOLAM HCL 2 MG/2ML IJ SOLN
INTRAMUSCULAR | Status: AC
  Filled 2023-12-23: qty 6

## 2023-12-23 MED ORDER — DEXAMETHASONE SODIUM PHOSPHATE 10 MG/ML IJ SOLN
8.0000 mg | Freq: Once | INTRAMUSCULAR | Status: AC
  Administered 2023-12-23: 8 mg via INTRAVENOUS
  Filled 2023-12-23: qty 1

## 2023-12-23 MED ORDER — VERAPAMIL HCL 2.5 MG/ML IV SOLN
INTRA_ARTERIAL | Status: AC | PRN
  Administered 2023-12-23: 6 mL via INTRA_ARTERIAL

## 2023-12-23 MED ORDER — HYDROMORPHONE HCL 1 MG/ML IJ SOLN
1.0000 mg | Freq: Once | INTRAMUSCULAR | Status: AC
  Administered 2023-12-23: 1 mg via INTRAVENOUS
  Filled 2023-12-23: qty 1

## 2023-12-23 MED ORDER — HEPARIN SODIUM (PORCINE) 1000 UNIT/ML IJ SOLN
INTRAMUSCULAR | Status: AC
  Filled 2023-12-23: qty 10

## 2023-12-23 MED ORDER — FENTANYL CITRATE (PF) 100 MCG/2ML IJ SOLN
INTRAMUSCULAR | Status: AC
Start: 2023-12-23 — End: ?
  Filled 2023-12-23: qty 4

## 2023-12-23 MED ORDER — IOHEXOL 300 MG/ML  SOLN
200.0000 mL | Freq: Once | INTRAMUSCULAR | Status: AC | PRN
Start: 2023-12-23 — End: 2023-12-23
  Administered 2023-12-23: 80 mL via INTRA_ARTERIAL

## 2023-12-23 MED ORDER — MIDAZOLAM HCL 2 MG/2ML IJ SOLN
INTRAMUSCULAR | Status: AC | PRN
  Administered 2023-12-23: .5 mg via INTRAVENOUS

## 2023-12-23 MED ORDER — SODIUM CHLORIDE 0.9 % IV SOLN
2.0000 g | Freq: Once | INTRAVENOUS | Status: AC
  Administered 2023-12-23: 2 g via INTRAVENOUS
  Filled 2023-12-23: qty 2

## 2023-12-23 MED ORDER — HYDRALAZINE HCL 20 MG/ML IJ SOLN: 5.0000 mg | INTRAMUSCULAR | Status: AC | PRN

## 2023-12-23 MED ORDER — NITROGLYCERIN 0.4 MG SL SUBL
SUBLINGUAL_TABLET | SUBLINGUAL | Status: AC
  Filled 2023-12-23: qty 1

## 2023-12-23 MED ORDER — HYDRALAZINE HCL 20 MG/ML IJ SOLN
10.0000 mg | Freq: Once | INTRAMUSCULAR | Status: AC
Start: 2023-12-23 — End: 2023-12-23
  Administered 2023-12-23: 10 mg via INTRAVENOUS

## 2023-12-23 MED ORDER — OXYCODONE-ACETAMINOPHEN 10-325 MG PO TABS
1.0000 | ORAL_TABLET | Freq: Four times a day (QID) | ORAL | 0 refills | Status: DC | PRN
  Filled 2023-12-23: qty 15, 4d supply, fill #0

## 2023-12-23 MED ORDER — ONDANSETRON 8 MG/NS 50 ML IVPB
8.0000 mg | Freq: Once | INTRAVENOUS | Status: AC
  Administered 2023-12-23: 8 mg via INTRAVENOUS
  Filled 2023-12-23: qty 8

## 2023-12-23 NOTE — Progress Notes (Signed)
 1430 Nausea with vomiting, spoke with Dr. Darylene Epley, ordered Zofran  for nausea and Dilaudid  after evaluating Richard Bowman.

## 2023-12-23 NOTE — Progress Notes (Signed)
 1550 Spoke with Dr. Mugweru gave an update on Mr. Forcier.  Okay to discharge home.  Plans to come by to see Mr. Sonsone. 1555 Dr. Darylene Epley in to see, Mr. Fayne Hoover had another vomiting episode, Phenergan  12.5 mg IV ordered. 1620 Phenergan  12.5 mg started IV.

## 2023-12-23 NOTE — Progress Notes (Addendum)
  IR PROGRESS NOTE  Dr. Darylene Epley and I rounded on Mr. Samuelson in Recovery unit status post DEB-TACE procedure. Patient generally feeling warm and nauseated. Patient was given Zofran  for nausea. Despite this, he did have an episode of emesis, for which he was ordered phenergan .   Outpatient medications for pain and nausea were submitted to [facility]. Patient's sister was contacted, and instructions given for discharge. Patient's sister was also requested to pick up the nausea and pain medications at outpatient pharmacy on her way in to pick-up patient. She voiced understanding.  Patient vitals stable.  Answered all patient's and his sister's questions. Patient is ok to discharge.  Follow-up in clinic in 2 months. Patient will be contacted by scheduler to schedule closer to time of appointment.  Electronically Signed: Lovena Rubinstein, PA-C 12/23/2023, 4:11 PM

## 2023-12-23 NOTE — Progress Notes (Signed)
 1730 Dr. Darylene Epley called to come and reassess Mr. Forquer before discharge, okay to discharge home. Very sleepy after receiving IV Phernergan.

## 2023-12-23 NOTE — Discharge Instructions (Signed)
 Please call Interventional Radiology clinic [phone] with any questions or concerns.  You may remove your dressing and shower tomorrow. DEB-TACE After the procedure, it is common to have: Pain, nausea, vomiting, and a slight fever. You may have temperature readings between 98.38F and 100.66F (37C and 38C). These symptoms are called post-embolization syndrome. Tiredness (fatigue) Loss of appetite. This should gradually improve after about 1 week Soreness and tenderness in the area where the needle and catheter were placed (puncture site)  Follow these instructions at home:  Medication: Do not use Aspirin or ibuprofen  products, such as Advil  or Motrin , as it may increase bleeding.  You may resume your usual medications as ordered by your doctor If your doctor prescribed antibiotics, take them as directed. Do not stop taking them just because you feel better. You need to take the full course of antibiotics.  Eating and drinking: Drink plenty of liquids to keep your urine pale yellow You can resume your regular diet as directed by your doctor   Care of the procedure site Follow instructions from your health care provider about how to take care of the puncture site. Make sure you: Wash your hands with soap and water for at least 20 seconds before and after you change your bandage (dressing). If soap and water are not available, use hand sanitizer Change your dressing as told by your health care provider Leave stitches (sutures), skin glue, or adhesive strips in place. These skin closures may need to stay in place for 2 weeks or longer. If adhesive strip edges start to loosen and curl up, you may trim the loose edges. Do not remove adhesive strips completely unless your health care provider tells you to do that. Check your puncture site every day for signs of infection. Check for: More redness, swelling, or pain More fluid or blood Warmth Pus or a bad smell Activity Rest as told by  your health care provider You may have to avoid lifting. Ask your health care provider how much you can safely lift. Return to your normal activities as told by your health care provider. Ask your health care provider what activities are safe for you. Do not take baths, swim, or use a hot tub until your health care provider approves. You may take showers. Gently wash your puncture site with mild soap and water and pat the area dry  Keep all follow-up visits as told by your doctor  Contact a health care provider if: You have any of these signs of infection: More redness, swelling, or pain around your puncture site More fluid or blood coming from your puncture site Warmth coming from your puncture site Pus or a bad smell coming from your puncture site You have a fever that gets worse or is higher than what your health care provider told you to expect You have pain that gets worse or does not get better with medicine You have nausea or vomiting that lasts for more than 2 days You cannot drink fluids without vomiting You develop a rash  Get help right away if: You develop any of the following in your legs: Pain Swelling Skin that is cold or pale or turns blue You develop shortness of breath You faint You have chest pain You feel weak or have trouble moving your arms or legs You have problems with speech or vision

## 2023-12-23 NOTE — Procedures (Signed)
 Vascular and Interventional Radiology Procedure Note  Patient: Richard Bowman DOB: 16-Apr-1976 Medical Record Number: [id number] Note Date/Time: 12/23/23 12:03 PM   Performing Physician: Art Largo, MD Assistant(s): None  Diagnosis: Colon CA w liver metastasis  Procedure:  HEPATIC ARTERIOGRAPHY  CHEMOEMBOLIZATION OF LIVER TUMORS  Anesthesia: Conscious Sedation Complications: None Estimated Blood Loss: Minimal Specimens: None  Findings:  - access via the LEFT radial artery. - diagnostic hepatic angiography was performed.   - successful injection of 300 - 500 um LC beads laced in 150 mg DOXOrubicin  (Adriamycin ) into the RIGHT and LEFT hepatic arteries. - TR band closure at L wrist at the end of the case  Plan: - Post sheath removal precautions.  - Standard post radial access deflation protocol.   Final report to follow once all images are reviewed and compared with previous studies.  See detailed dictation with images in PACS. The patient tolerated the procedure well without incident or complication and was returned to Recovery in stable condition.    Art Largo, MD Vascular and Interventional Radiology Specialists [facility] Radiology   Pager. [phone] Clinic. [phone]

## 2023-12-27 ENCOUNTER — Emergency Department (HOSPITAL_BASED_OUTPATIENT_CLINIC_OR_DEPARTMENT_OTHER)
Admission: EM | Admit: 2023-12-27 | Discharge: 2023-12-27 | Discharge status: Against Medical Advice | Attending: Emergency Medicine | Admitting: Emergency Medicine

## 2023-12-27 ENCOUNTER — Emergency Department (HOSPITAL_BASED_OUTPATIENT_CLINIC_OR_DEPARTMENT_OTHER)

## 2023-12-27 ENCOUNTER — Encounter (HOSPITAL_BASED_OUTPATIENT_CLINIC_OR_DEPARTMENT_OTHER): Payer: Self-pay

## 2023-12-27 ENCOUNTER — Other Ambulatory Visit: Payer: Self-pay

## 2023-12-27 DIAGNOSIS — E860 Dehydration: Secondary | ICD-10-CM | POA: Diagnosis not present

## 2023-12-27 DIAGNOSIS — R112 Nausea with vomiting, unspecified: Secondary | ICD-10-CM | POA: Diagnosis present

## 2023-12-27 LAB — CBC WITH DIFFERENTIAL/PLATELET
Abs Immature Granulocytes: 0.11 10*3/uL — ABNORMAL HIGH (ref 0.00–0.07)
Basophils Absolute: 0 10*3/uL (ref 0.0–0.1)
Basophils Relative: 0 %
Eosinophils Absolute: 0 10*3/uL (ref 0.0–0.5)
Eosinophils Relative: 0 %
HCT: 44.2 % (ref 39.0–52.0)
Hemoglobin: 15.7 g/dL (ref 13.0–17.0)
Immature Granulocytes: 1 %
Lymphocytes Relative: 9 %
Lymphs Abs: 1.5 10*3/uL (ref 0.7–4.0)
MCH: 30.3 pg (ref 26.0–34.0)
MCHC: 35.5 g/dL (ref 30.0–36.0)
MCV: 85.2 fL (ref 80.0–100.0)
Monocytes Absolute: 0.9 10*3/uL (ref 0.1–1.0)
Monocytes Relative: 5 %
Neutro Abs: 13.9 10*3/uL — ABNORMAL HIGH (ref 1.7–7.7)
Neutrophils Relative %: 85 %
Platelets: 170 10*3/uL (ref 150–400)
RBC: 5.19 MIL/uL (ref 4.22–5.81)
RDW: 13 % (ref 11.5–15.5)
WBC: 16.5 10*3/uL — ABNORMAL HIGH (ref 4.0–10.5)
nRBC: 0 % (ref 0.0–0.2)

## 2023-12-27 LAB — COMPREHENSIVE METABOLIC PANEL WITH GFR
ALT: 295 U/L — ABNORMAL HIGH (ref 0–44)
AST: 129 U/L — ABNORMAL HIGH (ref 15–41)
Albumin: 3.9 g/dL (ref 3.5–5.0)
Alkaline Phosphatase: 222 U/L — ABNORMAL HIGH (ref 38–126)
Anion gap: 17 — ABNORMAL HIGH (ref 5–15)
BUN: 26 mg/dL — ABNORMAL HIGH (ref 6–20)
CO2: 21 mmol/L — ABNORMAL LOW (ref 22–32)
Calcium: 9.9 mg/dL (ref 8.9–10.3)
Chloride: 89 mmol/L — ABNORMAL LOW (ref 98–111)
Creatinine, Ser: 1.26 mg/dL — ABNORMAL HIGH (ref 0.61–1.24)
GFR, Estimated: >60 mL/min (ref >60)
Glucose, Bld: 127 mg/dL — ABNORMAL HIGH (ref 70–99)
Potassium: 4.3 mmol/L (ref 3.5–5.1)
Sodium: 127 mmol/L — ABNORMAL LOW (ref 135–145)
Total Bilirubin: 1.4 mg/dL — ABNORMAL HIGH (ref 0.0–1.2)
Total Protein: 7.8 g/dL (ref 6.5–8.1)

## 2023-12-27 LAB — LIPASE, BLOOD: Lipase: 14 U/L (ref 11–51)

## 2023-12-27 MED ORDER — LACTATED RINGERS IV BOLUS
1000.0000 mL | Freq: Once | INTRAVENOUS | Status: AC
  Administered 2023-12-27: 1000 mL via INTRAVENOUS

## 2023-12-27 MED ORDER — HYDROMORPHONE HCL 1 MG/ML IJ SOLN
0.5000 mg | Freq: Once | INTRAMUSCULAR | Status: AC
  Administered 2023-12-27: 0.5 mg via INTRAVENOUS
  Filled 2023-12-27: qty 1

## 2023-12-27 MED ORDER — IOHEXOL 300 MG/ML  SOLN
100.0000 mL | Freq: Once | INTRAMUSCULAR | Status: AC | PRN
  Administered 2023-12-27: 125 mL via INTRAVENOUS

## 2023-12-27 MED ORDER — PIPERACILLIN-TAZOBACTAM 3.375 G IVPB 30 MIN
3.3750 g | Freq: Once | INTRAVENOUS | Status: AC
  Administered 2023-12-27: 3.375 g via INTRAVENOUS
  Filled 2023-12-27: qty 50

## 2023-12-27 MED ORDER — PROCHLORPERAZINE EDISYLATE 10 MG/2ML IJ SOLN
5.0000 mg | Freq: Once | INTRAMUSCULAR | Status: AC
  Administered 2023-12-27: 5 mg via INTRAVENOUS
  Filled 2023-12-27: qty 2

## 2023-12-27 NOTE — Discharge Instructions (Signed)
 Go to [facility] later today for your admission.

## 2023-12-27 NOTE — ED Notes (Signed)
 [facility] called for transport to [facility] @12 :12.

## 2023-12-27 NOTE — ED Notes (Signed)
 Pt leaving AMA, spoke to EDP prior. Expresses understanding of AMA.

## 2023-12-27 NOTE — ED Provider Notes (Signed)
 [place] [facility] Provider Note   CSN: [id number] Arrival date & time: 12/27/23  0740     History  Chief Complaint  Patient presents with   Emesis    Richard Bowman is a 48 y.o. male.   Emesis Patient presents with nausea vomiting abdominal pain.  Had liver embolization on Thursday with today being Monday.  Embolized for metastatic cancer.  Since then developed nausea vomiting abdominal pain.  States he is down about 15 pounds.  Unable to keep things down including his pain medicines or antiemetics.    Past Medical History:  Diagnosis Date   Cancer [facility])    Esophageal ulcer 2007   Renal disorder    CKD 10/28/20 "doesn't have it now"   Past Surgical History:  Procedure Laterality Date   BIOPSY  11/17/2022   Procedure: BIOPSY;  Surgeon: Sergio Dandy, MD;  Location: [facility] ENDOSCOPY;  Service: Gastroenterology;;   COLON RESECTION SIGMOID N/A 11/18/2022   Procedure: OPEN SIGMOID COLECTOMY;  Surgeon: Dareen Ebbing, MD;  Location: [facility] OR;  Service: General;  Laterality: N/A;   COLONOSCOPY WITH PROPOFOL  N/A 11/17/2022   Procedure: COLONOSCOPY WITH PROPOFOL ;  Surgeon: Sergio Dandy, MD;  Location: [facility] ENDOSCOPY;  Service: Gastroenterology;  Laterality: N/A;   ESOPHAGOGASTRODUODENOSCOPY (EGD) WITH PROPOFOL  N/A 11/17/2022   Procedure: ESOPHAGOGASTRODUODENOSCOPY (EGD) WITH PROPOFOL ;  Surgeon: Sergio Dandy, MD;  Location: [facility] ENDOSCOPY;  Service: Gastroenterology;  Laterality: N/A;   IR 3D INDEPENDENT WKST  12/23/2023   IR ANGIOGRAM SELECTIVE EACH ADDITIONAL VESSEL  12/23/2023   IR ANGIOGRAM SELECTIVE EACH ADDITIONAL VESSEL  12/23/2023   IR ANGIOGRAM SELECTIVE EACH ADDITIONAL VESSEL  12/23/2023   IR ANGIOGRAM VISCERAL SELECTIVE  12/23/2023   IR ANGIOGRAM VISCERAL SELECTIVE  12/23/2023   IR EMBO TUMOR ORGAN ISCHEMIA INFARCT INC GUIDE ROADMAPPING  12/23/2023   IR IMAGING GUIDED PORT INSERTION  12/14/2022   IR RADIOLOGIST EVAL & MGMT   11/18/2023   IR REMOVAL TUN ACCESS W/ PORT W/O FL MOD SED  10/28/2023   IR US  GUIDE VASC ACCESS LEFT  12/23/2023   IR US  GUIDE VASC ACCESS LEFT  12/23/2023   LIVER BIOPSY N/A 11/18/2022   Procedure: LIVER BIOPSY;  Surgeon: Dareen Ebbing, MD;  Location: [facility] OR;  Service: General;  Laterality: N/A;   SUBMUCOSAL TATTOO INJECTION  11/17/2022   Procedure: SUBMUCOSAL TATTOO INJECTION;  Surgeon: Nandigam, Kavitha V, MD;  Location: [facility] ENDOSCOPY;  Service: Gastroenterology;;     Home Medications Prior to Admission medications   Medication Sig Start Date End Date Taking? Authorizing Provider  acetaminophen  (TYLENOL ) 500 MG tablet Take 2 tablets (1,000 mg total) by mouth every 6 (six) hours as needed for mild pain. 11/22/22   Meuth, Brooke A, PA-C  dexamethasone  (DECADRON ) 4 MG tablet Take 2 tablets (8 mg) by mouth daily for 3 days starting the day after chemotherapy. Take with food. 11/29/23   Ivor Mars, MD  docusate sodium  (COLACE) 100 MG capsule Take 1 capsule (100 mg total) by mouth 2 (two) times daily. Patient not taking: Reported on 10/20/2023 11/23/22   Akula, Vijaya, MD  dronabinol  (MARINOL ) 5 MG capsule Take 1 capsule (5 mg total) by mouth 2 (two) times daily before a meal. 12/17/23   Ennever, Sherryll Donald, MD  gabapentin  (NEURONTIN ) 300 MG capsule Take 1 capsule (300 mg total) by mouth 3 (three) times daily. 09/17/23 12/16/23  Ivor Mars, MD  lactose free nutrition (BOOST) LIQD Take 237 mLs by  mouth daily as needed (Supplement).    [provider]  lactulose  (CHRONULAC ) 10 GM/15ML solution Take 15 mLs (10 g total) by mouth 3 (three) times daily. Patient not taking: Reported on 09/29/2023 12/04/22   Ivor Mars, MD  lidocaine -prilocaine  (EMLA ) cream Apply 1 Application topically as needed. Patient not taking: Reported on 12/17/2023 12/21/22   Ivor Mars, MD  lidocaine -prilocaine  (EMLA ) cream Apply to affected area once Patient not taking: Reported on 12/17/2023 11/29/23   Ivor Mars, MD  LORazepam  (ATIVAN ) 0.5 MG tablet Place 1 tablet (0.5 mg total) under the tongue every 6 (six) hours as needed for anxiety. 04/21/23   Ivor Mars, MD  methocarbamol  (ROBAXIN ) 500 MG tablet Take 1 tablet (500 mg total) by mouth every 6 (six) hours as needed for muscle spasms. 02/24/23   Ivor Mars, MD  metoCLOPramide  (REGLAN ) 10 MG tablet Take 1 tablet (10 mg total) by mouth every 6 (six) hours. 12/06/23   Nathanael Baker, DO  nystatin  (MYCOSTATIN ) 100000 UNIT/ML suspension Take 5 mLs (500,000 Units total) by mouth 4 (four) times daily. 12/16/22   Tish Forge, NP  ondansetron  (ZOFRAN ) 8 MG tablet Take 1 tablet (8 mg total) by mouth every 8 (eight) hours as needed for nausea or vomiting. Start on the third day after chemotherapy. 11/29/23   Ivor Mars, MD  ondansetron  (ZOFRAN -ODT) 4 MG disintegrating tablet Take 1 tablet (4 mg total) by mouth every 8 (eight) hours as needed for nausea or vomiting. 12/23/23   Carim, Charles A, PA-C  oxyCODONE  (OXY IR/ROXICODONE ) 5 MG immediate release tablet Take 1 tablet (5 mg total) by mouth every 4 (four) hours as needed for severe pain (pain score 7-10). 12/14/23   Ivor Mars, MD  oxyCODONE -acetaminophen  (PERCOCET) 10-325 MG tablet Take 1 tablet by mouth every 6 (six) hours as needed for pain. 12/23/23   Carim, Charles A, PA-C  pantoprazole  (PROTONIX ) 40 MG tablet Take 1 tablet (40 mg total) by mouth 2 (two) times daily. 10/04/23   Ivor Mars, MD  polyethylene glycol (MIRALAX  / GLYCOLAX ) 17 g packet Take 17 g by mouth daily as needed for mild constipation. Patient not taking: Reported on 09/29/2023 11/23/22   Akula, Vijaya, MD  prochlorperazine  (COMPAZINE ) 10 MG tablet Take 1 tablet (10 mg total) by mouth every 6 (six) hours as needed for nausea or vomiting. 11/29/23   Ivor Mars, MD  rivaroxaban  (XARELTO ) 10 MG TABS tablet Take 1 tablet (10 mg total) by mouth daily. 12/17/23   Ivor Mars, MD      Allergies    Xeloda   [capecitabine ]    Review of Systems   Review of Systems  Gastrointestinal:  Positive for vomiting.    Physical Exam Updated Vital Signs BP 125/82   Pulse 89   Temp 98.4 F (36.9 C) (Oral)   Resp 16   Ht 5\' 10"  (1.778 m)   Wt 99.8 kg   SpO2 100%   BMI 31.57 kg/m  Physical Exam Vitals and nursing note reviewed.  Abdominal:     Tenderness: There is abdominal tenderness.     Comments: Right side abdominal tenderness.  No hernia palpated.  Skin:    Capillary Refill: Capillary refill takes less than 2 seconds.     Coloration: Skin is not jaundiced.  Neurological:     Mental Status: He is alert.    ED Results / Procedures / Treatments   Labs (all labs ordered are listed,  but only abnormal results are displayed) Labs Reviewed  COMPREHENSIVE METABOLIC PANEL WITH GFR - Abnormal; Notable for the following components:      Result Value   Sodium 127 (*)    Chloride 89 (*)    CO2 21 (*)    Glucose, Bld 127 (*)    BUN 26 (*)    Creatinine, Ser 1.26 (*)    AST 129 (*)    ALT 295 (*)    Alkaline Phosphatase 222 (*)    Total Bilirubin 1.4 (*)    Anion gap 17 (*)    All other components within normal limits  CBC WITH DIFFERENTIAL/PLATELET - Abnormal; Notable for the following components:   WBC 16.5 (*)    Neutro Abs 13.9 (*)    Abs Immature Granulocytes 0.11 (*)    All other components within normal limits  LIPASE, BLOOD    EKG None  Radiology CT ABDOMEN PELVIS W WO CONTRAST Result Date: 12/27/2023 CLINICAL DATA:  Postop abdominal pain. EXAM: CT ABDOMEN AND PELVIS WITHOUT AND WITH CONTRAST TECHNIQUE: Multidetector CT imaging of the abdomen and pelvis was performed following the standard protocol before and following the bolus administration of intravenous contrast. RADIATION DOSE REDUCTION: This exam was performed according to the departmental dose-optimization program which includes automated exposure control, adjustment of the mA and/or kV according to patient size and/or  use of iterative reconstruction technique. CONTRAST:  OMNIPAQUE  IOHEXOL  300 MG/ML  SOLN COMPARISON:  12/06/2023 FINDINGS: Lower chest: 9 mm pulmonary nodule in the right lung base (image 14/series 14) is new in the interval. Dependent atelectasis bilaterally. Hepatobiliary: Multiple calcified hepatic metastases again noted without substantial change. There is an area decreased perfusion in the inferior right liver with a peripheral wedge-shaped configuration and adjacent edema in the perihepatic fat (see axial image 83/18 and coronal 55/20). 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 Gallbladder is distended. No intrahepatic or extrahepatic biliary dilation. Pancreas: No focal mass lesion. No dilatation of the main duct. No intraparenchymal cyst. No peripancreatic edema. Spleen: No splenomegaly. No suspicious focal mass lesion. Adrenals/Urinary Tract: No adrenal nodule or mass. Kidneys unremarkable. No evidence for hydroureter. The urinary bladder appears normal for the degree of distention. Stomach/Bowel: Stomach is unremarkable. No gastric wall thickening. No evidence of outlet obstruction. Duodenum is normally positioned as is the ligament of Treitz. No small bowel wall thickening. No small bowel dilatation. The terminal ileum is normal. The appendix is normal. No gross colonic mass. No colonic wall thickening. Rectosigmoid anastomosis evident. Vascular/Lymphatic: There is mild atherosclerotic calcification of the abdominal aorta without aneurysm. There is no gastrohepatic or hepatoduodenal ligament lymphadenopathy. No retroperitoneal or mesenteric lymphadenopathy. No pelvic sidewall lymphadenopathy. Reproductive: The prostate gland and seminal vesicles are unremarkable. Other: No intraperitoneal free fluid. Musculoskeletal: No worrisome lytic or sclerotic osseous abnormality. IMPRESSION: 1. Peripheral wedge-shaped area of decreased perfusion in the inferior right liver with adjacent  edema in the perihepatic fat. 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 2. Multiple calcified hepatic metastases again noted without substantial change. 3. 9 mm pulmonary nodule in the right lung base is new in the interval. This is most likely atelectatic but warrants close attention on follow-up. 4. Aortic atherosclerosis. Electronically Signed   By: Donnal Fusi M.D.   On: 12/27/2023 10:18    Procedures Procedures    Medications Ordered in ED Medications  prochlorperazine  (COMPAZINE ) injection 5 mg (has no administration in time range)  lactated ringers  bolus 1,000 mL (has no administration in  time range)  lactated ringers  bolus 1,000 mL (0 mLs Intravenous Stopped 12/27/23 1011)  HYDROmorphone  (DILAUDID ) injection 0.5 mg (0.5 mg Intravenous Given 12/27/23 0816)  prochlorperazine  (COMPAZINE ) injection 5 mg (5 mg Intravenous Given 12/27/23 0818)  iohexol  (OMNIPAQUE ) 300 MG/ML solution 100 mL (125 mLs Intravenous Contrast Given 12/27/23 0930)    ED Course/ Medical Decision Making/ A&P                                 Medical Decision Making Amount and/or Complexity of Data Reviewed Labs: ordered. Radiology: ordered.  Risk Prescription drug management. Decision regarding hospitalization.   Patient abdominal pain.  Nausea vomiting with recent procedure.  Differential diagnosis does include bleeding infection and pain.  Discussed with Dr. Jeanelle Milch from interventional radiology.  Will get CT scan with liver protocol.  Will give fluid bolus pain meds and antiemetics.  Reviewed recent oncology note. Mild hyponatremia.  Creatinine mildly increased.  CT scan done and shows potentially hepatic infarct.  Discussed with Dr. Julietta Ogren from interventional radiology.  Recommends IV hydration antibiotics admission to hospital.  Also discussed with Dr. Garon Kaiser from radiology.  Patient has developed return of nausea here.  Will discuss with hospitalist at [facility] for  admission.  Interventional radiology will follow.  Discussed with hospitalist who accepted admission.  Patient now not willing to stay.  States he has to go pacing pills.  States will go to [facility] later today for the admission.        Final Clinical Impression(s) / ED Diagnoses Final diagnoses:  Nausea and vomiting, unspecified vomiting type  Dehydration    Rx / DC Orders ED Discharge Orders     None         Mozell Arias, MD 12/27/23 1237

## 2023-12-27 NOTE — ED Notes (Signed)
 Called [facility] and cancelled the truck en route since pt has decided to leave AMA.

## 2023-12-27 NOTE — ED Notes (Signed)
 Pt given crackers and gatorade for Po challenge per EDP order

## 2023-12-27 NOTE — ED Triage Notes (Signed)
 Pt presents to ED from home C/O n/v since Thursday. Reports being treated for liver cancer. Drinking Dr. Kathlene Paradise and dry heaving in triage.

## 2023-12-29 ENCOUNTER — Telehealth (HOSPITAL_COMMUNITY): Payer: Self-pay | Admitting: Interventional Radiology

## 2023-12-29 NOTE — Progress Notes (Signed)
 Vascular and Interventional Radiology  Phone Note  Patient: Richard Bowman DOB: 1975/10/21 Medical Record Number: [id number] Note Date/Time: 12/29/23 12:22 PM   Diagnosis: Colon CA w liver metastases, s/p TACE 12/23/23  I identified myself to the patient and conveyed my credentials to Mr.Richard Bowman For medical emergencies, Pt was advised to call 911 or go to the nearest emergency room.   Assessment  Plan: 48 y.o. year old male POD 6 s/p TACE. VIR reached out in courtesy follow-up.  Pt endorses N/V post op, worst through POD 4 when he presented to [facility] ER on 12/27/23 for evaluation bc his Oncologist's office was not open at the time. He was recommended for admission for pain control but left AMA because he had "things to do". He reports feeling better, although he still experiences "pain" and has completed his PRN analgesics.   Improved condition. No concern at this time.    Follow up Oncology f/u for continued care Pt to follow up with me in Clinic within 1 month post op.   As part of this Telephone encounter, no in-person exam was conducted.  The patient was physically located in [place]  or a state in which I am permitted to provide care. The encounter was reasonable and appropriate under the circumstances given the patient's presentation at the time.   Art Largo, MD Vascular and Interventional Radiology Specialists [facility] Radiology   Pager. [phone] Clinic. [phone]

## 2023-12-30 ENCOUNTER — Ambulatory Visit (HOSPITAL_COMMUNITY)

## 2023-12-31 ENCOUNTER — Telehealth: Payer: Self-pay

## 2023-12-31 ENCOUNTER — Other Ambulatory Visit (HOSPITAL_BASED_OUTPATIENT_CLINIC_OR_DEPARTMENT_OTHER): Payer: Self-pay

## 2023-12-31 ENCOUNTER — Other Ambulatory Visit: Payer: Self-pay | Admitting: *Deleted

## 2023-12-31 DIAGNOSIS — C189 Malignant neoplasm of colon, unspecified: Secondary | ICD-10-CM

## 2023-12-31 MED ORDER — OXYCODONE HCL 5 MG PO TABS
5.0000 mg | ORAL_TABLET | ORAL | 0 refills | Status: DC | PRN
Start: 2023-12-31 — End: 2024-01-17
  Filled 2023-12-31: qty 60, 10d supply, fill #0

## 2023-12-31 NOTE — Telephone Encounter (Signed)
 Received phone call from patient with multiple questions and requests. Pt requesting pain medication be refilled. Pt aware that refill has been ordered and awaiting Dr. Maria Shiner to sign off on it before it will go to the pharmacy. Pt verbalized understanding.  Pt inquiring about diagnosis date and doctor notes; pt given the number to medical records.  Pt also c/o bruising and pain to bilateral arms "from where I have had IV's"  Pt states they have been assessed by RNs and Dr. Maria Shiner. Pt states he notices bruising around the sites and constant pain. Pt aware that his platelets are WNL and he can put warm packs on IV sites to help with discomfort. Pt encouraged to rotate IV sites to help with these symptoms. Pt educated that chemotherapy can be very irritating to his veins and understanding verbalized. Pt educated that this may be his new normal as he requested to have his port removed. Pt states the pain and sites are unchanged since being assessed by Dr. Maria Shiner. Pt aware that it can take weeks for full healing especially while receiving chemotherapy. Pt verbalized understanding and had no further questions. Pt aware of next appointment date and time. Pt appreciative of call.

## 2024-01-07 ENCOUNTER — Encounter: Payer: Self-pay | Admitting: Hematology & Oncology

## 2024-01-07 ENCOUNTER — Other Ambulatory Visit: Payer: Self-pay

## 2024-01-07 ENCOUNTER — Inpatient Hospital Stay

## 2024-01-07 ENCOUNTER — Inpatient Hospital Stay (HOSPITAL_BASED_OUTPATIENT_CLINIC_OR_DEPARTMENT_OTHER): Admitting: Hematology & Oncology

## 2024-01-07 ENCOUNTER — Inpatient Hospital Stay: Attending: Hematology & Oncology

## 2024-01-07 VITALS — BP 91/65 | HR 85 | Temp 98.1°F | Resp 19 | Ht 70.0 in | Wt 209.0 lb

## 2024-01-07 DIAGNOSIS — C787 Secondary malignant neoplasm of liver and intrahepatic bile duct: Secondary | ICD-10-CM

## 2024-01-07 DIAGNOSIS — D509 Iron deficiency anemia, unspecified: Secondary | ICD-10-CM | POA: Insufficient documentation

## 2024-01-07 DIAGNOSIS — C189 Malignant neoplasm of colon, unspecified: Secondary | ICD-10-CM

## 2024-01-07 DIAGNOSIS — Z79899 Other long term (current) drug therapy: Secondary | ICD-10-CM | POA: Insufficient documentation

## 2024-01-07 DIAGNOSIS — C187 Malignant neoplasm of sigmoid colon: Secondary | ICD-10-CM | POA: Insufficient documentation

## 2024-01-07 LAB — CMP (CANCER CENTER ONLY)
ALT: 87 U/L — ABNORMAL HIGH (ref 0–44)
AST: 51 U/L — ABNORMAL HIGH (ref 15–41)
Albumin: 3.4 g/dL — ABNORMAL LOW (ref 3.5–5.0)
Alkaline Phosphatase: 371 U/L — ABNORMAL HIGH (ref 38–126)
Anion gap: 6 (ref 5–15)
BUN: 19 mg/dL (ref 6–20)
CO2: 30 mmol/L (ref 22–32)
Calcium: 9.7 mg/dL (ref 8.9–10.3)
Chloride: 103 mmol/L (ref 98–111)
Creatinine: 1.1 mg/dL (ref 0.61–1.24)
GFR, Estimated: >60 mL/min (ref >60)
Glucose, Bld: 110 mg/dL — ABNORMAL HIGH (ref 70–99)
Potassium: 4.7 mmol/L (ref 3.5–5.1)
Sodium: 139 mmol/L (ref 135–145)
Total Bilirubin: 0.2 mg/dL (ref 0.0–1.2)
Total Protein: 6.7 g/dL (ref 6.5–8.1)

## 2024-01-07 LAB — CBC WITH DIFFERENTIAL (CANCER CENTER ONLY)
Abs Immature Granulocytes: 0.11 10*3/uL — ABNORMAL HIGH (ref 0.00–0.07)
Basophils Absolute: 0 10*3/uL (ref 0.0–0.1)
Basophils Relative: 1 %
Eosinophils Absolute: 0 10*3/uL (ref 0.0–0.5)
Eosinophils Relative: 1 %
HCT: 35.3 % — ABNORMAL LOW (ref 39.0–52.0)
Hemoglobin: 11.7 g/dL — ABNORMAL LOW (ref 13.0–17.0)
Immature Granulocytes: 2 %
Lymphocytes Relative: 29 %
Lymphs Abs: 1.6 10*3/uL (ref 0.7–4.0)
MCH: 30.1 pg (ref 26.0–34.0)
MCHC: 33.1 g/dL (ref 30.0–36.0)
MCV: 90.7 fL (ref 80.0–100.0)
Monocytes Absolute: 0.8 10*3/uL (ref 0.1–1.0)
Monocytes Relative: 15 %
Neutro Abs: 2.9 10*3/uL (ref 1.7–7.7)
Neutrophils Relative %: 52 %
Platelet Count: 339 10*3/uL (ref 150–400)
RBC: 3.89 MIL/uL — ABNORMAL LOW (ref 4.22–5.81)
RDW: 13.4 % (ref 11.5–15.5)
WBC Count: 5.4 10*3/uL (ref 4.0–10.5)
nRBC: 0 % (ref 0.0–0.2)

## 2024-01-07 LAB — CEA (ACCESS): CEA (CHCC): 14 ng/mL — ABNORMAL HIGH (ref 0.00–5.00)

## 2024-01-07 LAB — IRON AND IRON BINDING CAPACITY (CC-WL,HP ONLY)
Iron: 41 ug/dL — ABNORMAL LOW (ref 45–182)
Saturation Ratios: 16 % — ABNORMAL LOW (ref 17.9–39.5)
TIBC: 259 ug/dL (ref 250–450)
UIBC: 218 ug/dL (ref 117–376)

## 2024-01-07 LAB — FERRITIN: Ferritin: 1022 ng/mL — ABNORMAL HIGH (ref 24–336)

## 2024-01-07 NOTE — Progress Notes (Signed)
 Per MD,    Please r/s 5/9 Infusion for 1 week from today, pt does not want treatment today.    Please also push 5/30 and 6/20 appts  (lab/MD/Infusion )out 1 week to keep pt on schedule for future treatments.   Message to scheduling

## 2024-01-07 NOTE — Progress Notes (Signed)
 Hematology and Oncology Follow Up Visit  Richard Bowman [id number] 06-10-1976 48 y.o. 01/07/2024  Principle Diagnosis:  Metastatic adenocarcinoma of the colon-liver metastasis -- pMMR/MSI-low -- (+) KRAS/ ERBB2(+)       Iron deficiency anemia      Superficial thrombus of the right forearm  Current Therapy:   FOLFOXIRI - s/p cycle #11  - start on  -- 12/09/2022 --oxaliplatin  dropped after 04/19/2023 IV iron as indicated Xeloda /Avastin  -- s/p cycle #1 on 09/13/2023 --Avastin  on hold.  -Xeloda  d/c'ed on 10/15/2023 Enhertu  - s/p cycle #2 - start on 11/26/2023 Intrahepatic Y-90 -first dose on 12/23/2023 Xarelto  10 mg p.o. daily-started on 12/19/2023     Interim History:  Richard Bowman is back for follow-up.  Overall, he might be doing a little bit better.  He has had 2 cycles of Enhertu .  He also had the Y90 treatment.  The Y90 treatment was probably couple weeks ago..  This feels quite fatigued.  This could certainly be from the Y90 treatment.  He is having some discomfort.  However, he is trying his best to try to get off pain medication.  His labs all look all that bad.  In fact, his LFTs are much better.  His CEA today is down to 14.  It had been up to 27.  He does not want to have any treatment today.  I think we can probably hold off for a week or so.  I certainly would have no problems with this.  He continues on Xarelto .  The superficial thrombus in the right forearm certainly does appear to be better.    He is not having any problems with cough.  He is not smoking.  He has a poor appetite.  However, he says this is improving.  Again, I think this might be from the intrahepatic therapy that he had.  He has had no bleeding.  He has had no fever.  There has been no rashes.  Overall, I would say his performance status is probably ECOG 1.    Wt Readings from Last 3 Encounters:  01/07/24 209 lb (94.8 kg)  12/27/23 220 lb (99.8 kg)  12/23/23 218 lb 0.6 oz (98.9 kg)    Medications:   Current Outpatient Medications:    acetaminophen  (TYLENOL ) 500 MG tablet, Take 2 tablets (1,000 mg total) by mouth every 6 (six) hours as needed for mild pain., Disp: 30 tablet, Rfl: 0   dexamethasone  (DECADRON ) 4 MG tablet, Take 2 tablets (8 mg) by mouth daily for 3 days starting the day after chemotherapy. Take with food., Disp: 30 tablet, Rfl: 1   docusate sodium  (COLACE) 100 MG capsule, Take 1 capsule (100 mg total) by mouth 2 (two) times daily. (Patient not taking: Reported on 10/20/2023), Disp: 10 capsule, Rfl: 0   dronabinol  (MARINOL ) 5 MG capsule, Take 1 capsule (5 mg total) by mouth 2 (two) times daily before a meal., Disp: 60 capsule, Rfl: 0   gabapentin  (NEURONTIN ) 300 MG capsule, Take 1 capsule (300 mg total) by mouth 3 (three) times daily., Disp: 90 capsule, Rfl: 2   lactose free nutrition (BOOST) LIQD, Take 237 mLs by mouth daily as needed (Supplement)., Disp: , Rfl:    lactulose  (CHRONULAC ) 10 GM/15ML solution, Take 15 mLs (10 g total) by mouth 3 (three) times daily. (Patient not taking: Reported on 09/29/2023), Disp: 236 mL, Rfl: 0   lidocaine -prilocaine  (EMLA ) cream, Apply 1 Application topically as needed. (Patient not taking: Reported on 12/17/2023), Disp: 30 g,  Rfl: 0   lidocaine -prilocaine  (EMLA ) cream, Apply to affected area once (Patient not taking: Reported on 12/17/2023), Disp: 30 g, Rfl: 3   LORazepam  (ATIVAN ) 0.5 MG tablet, Place 1 tablet (0.5 mg total) under the tongue every 6 (six) hours as needed for anxiety., Disp: 60 tablet, Rfl: 0   methocarbamol  (ROBAXIN ) 500 MG tablet, Take 1 tablet (500 mg total) by mouth every 6 (six) hours as needed for muscle spasms., Disp: 30 tablet, Rfl: 0   metoCLOPramide  (REGLAN ) 10 MG tablet, Take 1 tablet (10 mg total) by mouth every 6 (six) hours., Disp: 30 tablet, Rfl: 0   nystatin  (MYCOSTATIN ) 100000 UNIT/ML suspension, Take 5 mLs (500,000 Units total) by mouth 4 (four) times daily., Disp: 473 mL, Rfl: 1   ondansetron  (ZOFRAN ) 8 MG tablet,  Take 1 tablet (8 mg total) by mouth every 8 (eight) hours as needed for nausea or vomiting. Start on the third day after chemotherapy., Disp: 30 tablet, Rfl: 1   ondansetron  (ZOFRAN -ODT) 4 MG disintegrating tablet, Take 1 tablet (4 mg total) by mouth every 8 (eight) hours as needed for nausea or vomiting., Disp: 20 tablet, Rfl: 0   oxyCODONE  (OXY IR/ROXICODONE ) 5 MG immediate release tablet, Take 1 tablet (5 mg total) by mouth every 4 (four) hours as needed for severe pain (pain score 7-10)., Disp: 60 tablet, Rfl: 0   oxyCODONE -acetaminophen  (PERCOCET) 10-325 MG tablet, Take 1 tablet by mouth every 6 (six) hours as needed for pain., Disp: 15 tablet, Rfl: 0   pantoprazole  (PROTONIX ) 40 MG tablet, Take 1 tablet (40 mg total) by mouth 2 (two) times daily., Disp: 60 tablet, Rfl: 4   polyethylene glycol (MIRALAX  / GLYCOLAX ) 17 g packet, Take 17 g by mouth daily as needed for mild constipation. (Patient not taking: Reported on 09/29/2023), Disp: 14 each, Rfl: 0   prochlorperazine  (COMPAZINE ) 10 MG tablet, Take 1 tablet (10 mg total) by mouth every 6 (six) hours as needed for nausea or vomiting., Disp: 30 tablet, Rfl: 1   rivaroxaban  (XARELTO ) 10 MG TABS tablet, Take 1 tablet (10 mg total) by mouth daily., Disp: 30 tablet, Rfl: 1  Allergies:  Allergies  Allergen Reactions   Xeloda  [Capecitabine ] Nausea And Vomiting and Other (See Comments)    Chest pain    Past Medical History, Surgical history, Social history, and Family History were reviewed and updated.  Review of Systems: Review of Systems  Constitutional: Negative.   HENT:  Negative.  Negative for hearing loss.   Eyes: Negative.  Negative for eye problems.  Respiratory: Negative.    Cardiovascular: Negative.   Gastrointestinal:  Positive for abdominal pain. Negative for constipation.  Endocrine: Negative.   Genitourinary: Negative.    Musculoskeletal: Negative.   Skin: Negative.   Neurological: Negative.        Numbness and tingling of  hands and feet  Hematological: Negative.   Psychiatric/Behavioral: Negative.      Physical Exam: Vitals:   01/07/24 0900  BP: 91/65  Pulse: 85  Resp: 19  Temp: 98.1 F (36.7 C)  SpO2: 97%   Wt Readings from Last 3 Encounters:  01/07/24 209 lb (94.8 kg)  12/27/23 220 lb (99.8 kg)  12/23/23 218 lb 0.6 oz (98.9 kg)    Physical Exam Vitals reviewed.  HENT:     Head: Normocephalic and atraumatic.  Eyes:     Pupils: Pupils are equal, round, and reactive to light.  Cardiovascular:     Rate and Rhythm: Normal rate and regular rhythm.  Heart sounds: Normal heart sounds.  Pulmonary:     Effort: Pulmonary effort is normal.     Breath sounds: Normal breath sounds.  Abdominal:     General: Bowel sounds are normal.     Palpations: Abdomen is soft.     Comments: Abdominal exam shows a well-healed laparotomy scar.  He has no fluid wave.  There is no guarding or rebound tenderness.    Musculoskeletal:        General: No tenderness or deformity. Normal range of motion.     Cervical back: Normal range of motion.     Comments: Extremities shows improved erythema and decrease in the venous cord in the flexor aspect of the right forearm.    Lymphadenopathy:     Cervical: No cervical adenopathy.  Skin:    General: Skin is warm and dry.     Findings: No erythema or rash.  Neurological:     Mental Status: He is alert and oriented to person, place, and time.  Psychiatric:        Behavior: Behavior normal.        Thought Content: Thought content normal.        Judgment: Judgment normal.      Lab Results  Component Value Date   WBC 5.4 01/07/2024   HGB 11.7 (L) 01/07/2024   HCT 35.3 (L) 01/07/2024   MCV 90.7 01/07/2024   PLT 339 01/07/2024     Chemistry      Component Value Date/Time   NA 139 01/07/2024 0901   K 4.7 01/07/2024 0901   CL 103 01/07/2024 0901   CO2 30 01/07/2024 0901   BUN 19 01/07/2024 0901   CREATININE 1.10 01/07/2024 0901      Component Value  Date/Time   CALCIUM  9.7 01/07/2024 0901   ALKPHOS 371 (H) 01/07/2024 0901   AST 51 (H) 01/07/2024 0901   ALT 87 (H) 01/07/2024 0901   BILITOT 0.2 01/07/2024 0901      Impression and Plan:  Richard Bowman is a very nice 48 year old white male.  He was found to have metastatic colon cancer.  He had been on previous treatment.  He had responded well to treatment.  He unfortunate, began to progress.  He currently is on Enhertu .  He has a rare cancer that is HER2 positive.  Looks like he is responding.  The CEA has come down.  I think some of this might have been also from the intrahepatic therapy.  Again we will hold his treatment today.  We will see about treating him next week.  I still want to hold off on doing a PET scan on him.  I think we can certainly hold off on scanning him.  I am so happy that IR was able to do the intrahepatic therapy.  He will have his treatment next week.  I do not have to see him.  We have I will plan to see him for his fourth cycle of treatment.    Ivor Mars, MD 5/9/202510:14 AM

## 2024-01-10 ENCOUNTER — Other Ambulatory Visit: Payer: Self-pay | Admitting: *Deleted

## 2024-01-10 ENCOUNTER — Telehealth: Payer: Self-pay | Admitting: *Deleted

## 2024-01-10 DIAGNOSIS — E875 Hyperkalemia: Secondary | ICD-10-CM

## 2024-01-10 DIAGNOSIS — C787 Secondary malignant neoplasm of liver and intrahepatic bile duct: Secondary | ICD-10-CM

## 2024-01-10 DIAGNOSIS — C189 Malignant neoplasm of colon, unspecified: Secondary | ICD-10-CM

## 2024-01-10 NOTE — Telephone Encounter (Signed)
 Returned patient's phone call regarding not feeling well. He stated,"I've been so exhausted and tired since having the Y90 procedure last week. My appetite isn't back. I eat little snacks and drink water throughout the day." I reviewed his labs from Friday with Dr. Maria Shiner. We are going to bring him in for IV Monoferric  due to the iron saturation dropped to 16. LOS sent to scheduling. Pharmacy has the medication. He verbalized understanding.

## 2024-01-11 ENCOUNTER — Encounter: Payer: Self-pay | Admitting: Hematology & Oncology

## 2024-01-11 ENCOUNTER — Inpatient Hospital Stay

## 2024-01-11 ENCOUNTER — Encounter: Payer: Self-pay | Admitting: *Deleted

## 2024-01-11 ENCOUNTER — Other Ambulatory Visit: Payer: Self-pay

## 2024-01-11 VITALS — BP 112/75 | HR 85 | Temp 98.0°F

## 2024-01-11 DIAGNOSIS — C189 Malignant neoplasm of colon, unspecified: Secondary | ICD-10-CM

## 2024-01-11 DIAGNOSIS — E875 Hyperkalemia: Secondary | ICD-10-CM

## 2024-01-11 DIAGNOSIS — C787 Secondary malignant neoplasm of liver and intrahepatic bile duct: Secondary | ICD-10-CM

## 2024-01-11 DIAGNOSIS — D509 Iron deficiency anemia, unspecified: Secondary | ICD-10-CM | POA: Diagnosis not present

## 2024-01-11 LAB — CMP (CANCER CENTER ONLY)
ALT: 66 U/L — ABNORMAL HIGH (ref 0–44)
AST: 39 U/L (ref 15–41)
Albumin: 3.9 g/dL (ref 3.5–5.0)
Alkaline Phosphatase: 407 U/L — ABNORMAL HIGH (ref 38–126)
Anion gap: 8 (ref 5–15)
BUN: 16 mg/dL (ref 6–20)
CO2: 30 mmol/L (ref 22–32)
Calcium: 10.5 mg/dL — ABNORMAL HIGH (ref 8.9–10.3)
Chloride: 100 mmol/L (ref 98–111)
Creatinine: 1.02 mg/dL (ref 0.61–1.24)
GFR, Estimated: >60 mL/min (ref >60)
Glucose, Bld: 181 mg/dL — ABNORMAL HIGH (ref 70–99)
Potassium: 4.4 mmol/L (ref 3.5–5.1)
Sodium: 138 mmol/L (ref 135–145)
Total Bilirubin: 0.4 mg/dL (ref 0.0–1.2)
Total Protein: 7.6 g/dL (ref 6.5–8.1)

## 2024-01-11 LAB — CBC WITH DIFFERENTIAL (CANCER CENTER ONLY)
Abs Immature Granulocytes: 0.15 10*3/uL — ABNORMAL HIGH (ref 0.00–0.07)
Basophils Absolute: 0.1 10*3/uL (ref 0.0–0.1)
Basophils Relative: 1 %
Eosinophils Absolute: 0 10*3/uL (ref 0.0–0.5)
Eosinophils Relative: 1 %
HCT: 41.2 % (ref 39.0–52.0)
Hemoglobin: 13.7 g/dL (ref 13.0–17.0)
Immature Granulocytes: 2 %
Lymphocytes Relative: 17 %
Lymphs Abs: 1.4 10*3/uL (ref 0.7–4.0)
MCH: 30 pg (ref 26.0–34.0)
MCHC: 33.3 g/dL (ref 30.0–36.0)
MCV: 90.2 fL (ref 80.0–100.0)
Monocytes Absolute: 0.7 10*3/uL (ref 0.1–1.0)
Monocytes Relative: 8 %
Neutro Abs: 6.1 10*3/uL (ref 1.7–7.7)
Neutrophils Relative %: 71 %
Platelet Count: 332 10*3/uL (ref 150–400)
RBC: 4.57 MIL/uL (ref 4.22–5.81)
RDW: 13.7 % (ref 11.5–15.5)
WBC Count: 8.4 10*3/uL (ref 4.0–10.5)
nRBC: 0 % (ref 0.0–0.2)

## 2024-01-11 MED ORDER — SODIUM CHLORIDE 0.9 % IV SOLN
Freq: Once | INTRAVENOUS | Status: AC
Start: 2024-01-11 — End: 2024-01-11

## 2024-01-11 MED ORDER — LORAZEPAM 2 MG/ML IJ SOLN
0.5000 mg | Freq: Once | INTRAMUSCULAR | Status: AC
  Administered 2024-01-11: 0.5 mg via INTRAVENOUS
  Filled 2024-01-11: qty 1

## 2024-01-11 MED ORDER — SODIUM CHLORIDE 0.9 % IV SOLN
1000.0000 mg | Freq: Once | INTRAVENOUS | Status: DC
  Administered 2024-01-11: 1000 mg via INTRAVENOUS
  Filled 2024-01-11: qty 10

## 2024-01-11 NOTE — Patient Instructions (Signed)

## 2024-01-11 NOTE — Addendum Note (Signed)
 Addended by: Gray Layman R on: 01/11/2024 01:04 PM   Modules accepted: Orders

## 2024-01-11 NOTE — Progress Notes (Unsigned)
 Patient is not doing well. He c/o being extremely fatigued and not feeling well. He believes it to be related to his Y90 treatment. He refused treatment on Friday, but did come in today for iron infusion. Plan was for patient to return at the end of this week for his next treatment, but at this time is refusing to schedule due to his symptoms.   Patient did agree to scheduling an appointment next week.   Oncology Nurse Navigator Documentation     01/11/2024    8:30 AM  Oncology Nurse Navigator Flowsheets  Navigator Follow Up Date: 01/18/2024  Navigator Follow Up Reason: Follow-up Appointment;Chemotherapy  Navigator Radiographer, therapeutic Encounter Type Appt/Treatment Plan Review  Patient Visit Type MedOnc  Treatment Phase Active Tx  Barriers/Navigation Needs Coordination of Care  Education Pain/ Symptom Management;Preparing for Upcoming Surgery/ Treatment  Interventions Education;Psycho-Social Support  Acuity Level 1-No Barriers  Education Method Verbal  Time Spent with Patient 30

## 2024-01-12 ENCOUNTER — Encounter: Payer: Self-pay | Admitting: Hematology & Oncology

## 2024-01-17 ENCOUNTER — Encounter: Payer: Self-pay | Admitting: Hematology & Oncology

## 2024-01-17 ENCOUNTER — Other Ambulatory Visit: Payer: Self-pay | Admitting: Hematology & Oncology

## 2024-01-17 ENCOUNTER — Encounter (HOSPITAL_COMMUNITY): Payer: Self-pay | Admitting: Radiology

## 2024-01-17 ENCOUNTER — Other Ambulatory Visit (HOSPITAL_BASED_OUTPATIENT_CLINIC_OR_DEPARTMENT_OTHER): Payer: Self-pay

## 2024-01-17 DIAGNOSIS — C189 Malignant neoplasm of colon, unspecified: Secondary | ICD-10-CM

## 2024-01-17 MED ORDER — OXYCODONE HCL 5 MG PO TABS
5.0000 mg | ORAL_TABLET | ORAL | 0 refills | Status: DC | PRN
  Filled 2024-01-17: qty 60, 10d supply, fill #0

## 2024-01-18 ENCOUNTER — Inpatient Hospital Stay: Admitting: Medical Oncology

## 2024-01-18 ENCOUNTER — Other Ambulatory Visit: Payer: Self-pay | Admitting: Hematology & Oncology

## 2024-01-18 ENCOUNTER — Inpatient Hospital Stay

## 2024-01-18 ENCOUNTER — Encounter: Payer: Self-pay | Admitting: *Deleted

## 2024-01-18 NOTE — Progress Notes (Unsigned)
 Patient is a no-show to todays treatment. Message sent to scheduling requesting they reschedule patient.   Patient is reluctant to reschedule appointment. He says he's having multiple side effects from previous treatments. If he doesn't feel good, he doesn't want to come in. Multiple conversations have been had with the patient about the importance of coming to appointments even if feeling poorly so that his complaints can be addressed. He doesn't seem to comprehend this.   Oncology Nurse Navigator Documentation     01/18/2024    9:15 AM  Oncology Nurse Navigator Flowsheets  Navigator Follow Up Date: 02/01/2024  Navigator Follow Up Reason: Follow-up Appointment;Chemotherapy  Navigator Location [facility]  Navigator Encounter Type Appt/Treatment Plan Review  Patient Visit Type MedOnc  Treatment Phase Active Tx  Barriers/Navigation Needs Coordination of Care  Interventions Coordination of Care  Acuity Level 1-No Barriers  Coordination of Care Appts  Time Spent with Patient 15

## 2024-01-19 ENCOUNTER — Telehealth: Payer: Self-pay | Admitting: Hematology & Oncology

## 2024-01-19 NOTE — Telephone Encounter (Signed)
 Called to r/s missed appt. LVM to return call for scheduling.

## 2024-01-25 ENCOUNTER — Encounter: Payer: Self-pay | Admitting: Hematology & Oncology

## 2024-01-28 ENCOUNTER — Inpatient Hospital Stay

## 2024-01-28 ENCOUNTER — Inpatient Hospital Stay: Admitting: Hematology & Oncology

## 2024-02-01 ENCOUNTER — Ambulatory Visit: Payer: Self-pay | Admitting: Hematology & Oncology

## 2024-02-01 ENCOUNTER — Other Ambulatory Visit (HOSPITAL_BASED_OUTPATIENT_CLINIC_OR_DEPARTMENT_OTHER): Payer: Self-pay

## 2024-02-01 ENCOUNTER — Inpatient Hospital Stay

## 2024-02-01 ENCOUNTER — Inpatient Hospital Stay (HOSPITAL_BASED_OUTPATIENT_CLINIC_OR_DEPARTMENT_OTHER): Admitting: Hematology & Oncology

## 2024-02-01 ENCOUNTER — Inpatient Hospital Stay: Attending: Hematology & Oncology

## 2024-02-01 ENCOUNTER — Encounter: Payer: Self-pay | Admitting: *Deleted

## 2024-02-01 ENCOUNTER — Encounter: Payer: Self-pay | Admitting: Hematology & Oncology

## 2024-02-01 VITALS — BP 136/86 | HR 77 | Temp 98.6°F | Resp 18 | Ht 70.0 in | Wt 199.2 lb

## 2024-02-01 DIAGNOSIS — C787 Secondary malignant neoplasm of liver and intrahepatic bile duct: Secondary | ICD-10-CM

## 2024-02-01 DIAGNOSIS — D509 Iron deficiency anemia, unspecified: Secondary | ICD-10-CM | POA: Insufficient documentation

## 2024-02-01 DIAGNOSIS — Z5112 Encounter for antineoplastic immunotherapy: Secondary | ICD-10-CM | POA: Insufficient documentation

## 2024-02-01 DIAGNOSIS — C189 Malignant neoplasm of colon, unspecified: Secondary | ICD-10-CM

## 2024-02-01 DIAGNOSIS — Z79899 Other long term (current) drug therapy: Secondary | ICD-10-CM | POA: Diagnosis not present

## 2024-02-01 LAB — CBC WITH DIFFERENTIAL (CANCER CENTER ONLY)
Abs Immature Granulocytes: 0.02 10*3/uL (ref 0.00–0.07)
Basophils Absolute: 0 10*3/uL (ref 0.0–0.1)
Basophils Relative: 1 %
Eosinophils Absolute: 0.4 10*3/uL (ref 0.0–0.5)
Eosinophils Relative: 6 %
HCT: 46.1 % (ref 39.0–52.0)
Hemoglobin: 15.2 g/dL (ref 13.0–17.0)
Immature Granulocytes: 0 %
Lymphocytes Relative: 19 %
Lymphs Abs: 1.4 10*3/uL (ref 0.7–4.0)
MCH: 30 pg (ref 26.0–34.0)
MCHC: 33 g/dL (ref 30.0–36.0)
MCV: 90.9 fL (ref 80.0–100.0)
Monocytes Absolute: 0.6 10*3/uL (ref 0.1–1.0)
Monocytes Relative: 8 %
Neutro Abs: 4.8 10*3/uL (ref 1.7–7.7)
Neutrophils Relative %: 66 %
Platelet Count: 126 10*3/uL — ABNORMAL LOW (ref 150–400)
RBC: 5.07 MIL/uL (ref 4.22–5.81)
RDW: 15 % (ref 11.5–15.5)
WBC Count: 7.2 10*3/uL (ref 4.0–10.5)
nRBC: 0 % (ref 0.0–0.2)

## 2024-02-01 LAB — CMP (CANCER CENTER ONLY)
ALT: 52 U/L — ABNORMAL HIGH (ref 0–44)
AST: 45 U/L — ABNORMAL HIGH (ref 15–41)
Albumin: 4.3 g/dL (ref 3.5–5.0)
Alkaline Phosphatase: 342 U/L — ABNORMAL HIGH (ref 38–126)
Anion gap: 8 (ref 5–15)
BUN: 15 mg/dL (ref 6–20)
CO2: 30 mmol/L (ref 22–32)
Calcium: 10.3 mg/dL (ref 8.9–10.3)
Chloride: 102 mmol/L (ref 98–111)
Creatinine: 1.07 mg/dL (ref 0.61–1.24)
GFR, Estimated: >60 mL/min (ref >60)
Glucose, Bld: 116 mg/dL — ABNORMAL HIGH (ref 70–99)
Potassium: 4.2 mmol/L (ref 3.5–5.1)
Sodium: 140 mmol/L (ref 135–145)
Total Bilirubin: 0.9 mg/dL (ref 0.0–1.2)
Total Protein: 7.9 g/dL (ref 6.5–8.1)

## 2024-02-01 LAB — CEA (ACCESS): CEA (CHCC): 13.51 ng/mL — ABNORMAL HIGH (ref 0.00–5.00)

## 2024-02-01 LAB — IRON AND IRON BINDING CAPACITY (CC-WL,HP ONLY)
Iron: 128 ug/dL (ref 45–182)
Saturation Ratios: 42 % — ABNORMAL HIGH (ref 17.9–39.5)
TIBC: 307 ug/dL (ref 250–450)
UIBC: 179 ug/dL (ref 117–376)

## 2024-02-01 LAB — FERRITIN: Ferritin: 1236 ng/mL — ABNORMAL HIGH (ref 24–336)

## 2024-02-01 LAB — TSH: TSH: 0.583 u[IU]/mL (ref 0.350–4.500)

## 2024-02-01 MED ORDER — DRONABINOL 5 MG PO CAPS
5.0000 mg | ORAL_CAPSULE | Freq: Two times a day (BID) | ORAL | 0 refills | Status: DC
  Filled 2024-02-01: qty 60, 30d supply, fill #0
  Filled 2024-02-03: qty 30, 15d supply, fill #0
  Filled 2024-03-17: qty 30, 15d supply, fill #1

## 2024-02-01 MED ORDER — OXYCODONE HCL 5 MG PO TABS
5.0000 mg | ORAL_TABLET | ORAL | 0 refills | Status: DC | PRN
  Filled 2024-02-01: qty 60, 10d supply, fill #0

## 2024-02-01 MED ORDER — LORAZEPAM 0.5 MG PO TABS
0.5000 mg | ORAL_TABLET | Freq: Four times a day (QID) | ORAL | 0 refills | Status: AC | PRN
  Filled 2024-02-01: qty 60, 15d supply, fill #0

## 2024-02-01 MED ORDER — OLANZAPINE 10 MG PO TABS
10.0000 mg | ORAL_TABLET | Freq: Every day | ORAL | 6 refills | Status: AC
  Filled 2024-02-01: qty 30, 30d supply, fill #0

## 2024-02-01 NOTE — Progress Notes (Unsigned)
 Patient continues to do poorly. It is unclear if the symptoms he is experiencing is from his Y90 or the Enhertu . He is not a good historian and it's difficult to discern which medications he is taking for symptom management. He refuses treatment today.  He will return next week for treatment.   Oncology Nurse Navigator Documentation     02/01/2024    9:00 AM  Oncology Nurse Navigator Flowsheets  Navigator Follow Up Date: 02/29/2024  Navigator Follow Up Reason: Follow-up Appointment;Chemotherapy  Navigator Location [facility]  Navigator Encounter Type Follow-up Appt  Patient Visit Type MedOnc  Treatment Phase Active Tx  Barriers/Navigation Needs Coordination of Care  Interventions None Required  Acuity Level 1-No Barriers  Time Spent with Patient 15

## 2024-02-01 NOTE — Progress Notes (Signed)
 Pt entered infusion room, states" I really don't want to do this today" discussed with pt concerns regarding his treatment today. Pt states " I was in the hospital for the last treatment on my liver and I'm still not doing good." Pt requested I s/w MD about skipping treatment today and pick back up on 6/24 appt. MD, aware and confirmed  OK to skip today and pt to continue with next treatment 6/24. Encouraged pt to call office with concerns.Pt verbalized understanding.

## 2024-02-01 NOTE — Progress Notes (Signed)
 Hematology and Oncology Follow Up Visit  AKUL LEGGETTE [id number] 06-21-1976 48 y.o. 02/01/2024  Principle Diagnosis:  Metastatic adenocarcinoma of the colon-liver metastasis -- pMMR/MSI-low -- (+) KRAS/ ERBB2(+)       Iron deficiency anemia      Superficial thrombus of the right forearm  Current Therapy:   FOLFOXIRI - s/p cycle #11  - start on  -- 12/09/2022 --oxaliplatin  dropped after 04/19/2023 IV iron as indicated Xeloda /Avastin  -- s/p cycle #1 on 09/13/2023 --Avastin  on hold.  -Xeloda  d/c'ed on 10/15/2023 Enhertu  - s/p cycle #2 - start on 11/26/2023 Intrahepatic Y-90 -first dose on 12/23/2023 Xarelto  10 mg p.o. daily-started on 12/19/2023     Interim History:  Mr. Coffin is back for follow-up.  Unfortunately, he still have some problems with the liver.  Some of this might be from the intrahepatic therapy that he received.  It also could be part of the Enhertu .  He wants to  hold off of the Enhertu  for right now.  I think his last dose was probably about a month or so ago.  He has been having problems with nausea or vomiting.  Again, I am not sure what medicines he actually is taking.  I really think that olanzapine would be good for him.  Hopefully he will take this.  Of note, his last CEA was down to 14.  As such, his treatments are working.  I told him that he has the unusual tumor type that has the response to anti-HER2 therapy.  Again, he just did not feel like he can take treatment right now.  I am not sure when he sees Interventional Radiology again.  He has had no fever.  He has had no bleeding.  Has had no leg swelling.  I think he is still smoking.  He is trying to cut back.  Overall, I will say that his performance status is probably ECOG 1. .    Wt Readings from Last 3 Encounters:  02/01/24 199 lb 4 oz (90.4 kg)  01/07/24 209 lb (94.8 kg)  12/27/23 220 lb (99.8 kg)    Medications:  Current Outpatient Medications:    dronabinol  (MARINOL ) 5 MG capsule, Take 1  capsule (5 mg total) by mouth 2 (two) times daily before a meal., Disp: 60 capsule, Rfl: 0   lactose free nutrition (BOOST) LIQD, Take 237 mLs by mouth daily as needed (Supplement)., Disp: , Rfl:    oxyCODONE  (OXY IR/ROXICODONE ) 5 MG immediate release tablet, Take 1 tablet (5 mg total) by mouth every 4 (four) hours as needed for severe pain (pain score 7-10)., Disp: 60 tablet, Rfl: 0   oxyCODONE -acetaminophen  (PERCOCET) 10-325 MG tablet, Take 1 tablet by mouth every 6 (six) hours as needed for pain., Disp: 15 tablet, Rfl: 0   acetaminophen  (TYLENOL ) 500 MG tablet, Take 2 tablets (1,000 mg total) by mouth every 6 (six) hours as needed for mild pain. (Patient not taking: Reported on 02/01/2024), Disp: 30 tablet, Rfl: 0   dexamethasone  (DECADRON ) 4 MG tablet, Take 2 tablets (8 mg) by mouth daily for 3 days starting the day after chemotherapy. Take with food. (Patient not taking: Reported on 02/01/2024), Disp: 30 tablet, Rfl: 1   docusate sodium  (COLACE) 100 MG capsule, Take 1 capsule (100 mg total) by mouth 2 (two) times daily. (Patient not taking: Reported on 10/20/2023), Disp: 10 capsule, Rfl: 0   gabapentin  (NEURONTIN ) 300 MG capsule, Take 1 capsule (300 mg total) by mouth 3 (three) times daily., Disp: 90  capsule, Rfl: 2   lactulose  (CHRONULAC ) 10 GM/15ML solution, Take 15 mLs (10 g total) by mouth 3 (three) times daily. (Patient not taking: Reported on 09/29/2023), Disp: 236 mL, Rfl: 0   lidocaine -prilocaine  (EMLA ) cream, Apply 1 Application topically as needed. (Patient not taking: Reported on 12/17/2023), Disp: 30 g, Rfl: 0   lidocaine -prilocaine  (EMLA ) cream, Apply to affected area once (Patient not taking: Reported on 12/17/2023), Disp: 30 g, Rfl: 3   LORazepam  (ATIVAN ) 0.5 MG tablet, Place 1 tablet (0.5 mg total) under the tongue every 6 (six) hours as needed for anxiety. (Patient not taking: Reported on 02/01/2024), Disp: 60 tablet, Rfl: 0   methocarbamol  (ROBAXIN ) 500 MG tablet, Take 1 tablet (500 mg  total) by mouth every 6 (six) hours as needed for muscle spasms. (Patient not taking: Reported on 02/01/2024), Disp: 30 tablet, Rfl: 0   metoCLOPramide  (REGLAN ) 10 MG tablet, Take 1 tablet (10 mg total) by mouth every 6 (six) hours. (Patient not taking: Reported on 02/01/2024), Disp: 30 tablet, Rfl: 0   nystatin  (MYCOSTATIN ) 100000 UNIT/ML suspension, Take 5 mLs (500,000 Units total) by mouth 4 (four) times daily. (Patient not taking: Reported on 02/01/2024), Disp: 473 mL, Rfl: 1   ondansetron  (ZOFRAN ) 8 MG tablet, Take 1 tablet (8 mg total) by mouth every 8 (eight) hours as needed for nausea or vomiting. Start on the third day after chemotherapy. (Patient not taking: Reported on 02/01/2024), Disp: 30 tablet, Rfl: 1   ondansetron  (ZOFRAN -ODT) 4 MG disintegrating tablet, Take 1 tablet (4 mg total) by mouth every 8 (eight) hours as needed for nausea or vomiting. (Patient not taking: Reported on 02/01/2024), Disp: 20 tablet, Rfl: 0   pantoprazole  (PROTONIX ) 40 MG tablet, Take 1 tablet (40 mg total) by mouth 2 (two) times daily. (Patient not taking: Reported on 02/01/2024), Disp: 60 tablet, Rfl: 4   polyethylene glycol (MIRALAX  / GLYCOLAX ) 17 g packet, Take 17 g by mouth daily as needed for mild constipation. (Patient not taking: Reported on 09/29/2023), Disp: 14 each, Rfl: 0   prochlorperazine  (COMPAZINE ) 10 MG tablet, Take 1 tablet (10 mg total) by mouth every 6 (six) hours as needed for nausea or vomiting. (Patient not taking: Reported on 02/01/2024), Disp: 30 tablet, Rfl: 1   rivaroxaban  (XARELTO ) 10 MG TABS tablet, Take 1 tablet (10 mg total) by mouth daily. (Patient not taking: Reported on 02/01/2024), Disp: 30 tablet, Rfl: 1  Allergies:  Allergies  Allergen Reactions   Xeloda  [Capecitabine ] Nausea And Vomiting and Other (See Comments)    Chest pain    Past Medical History, Surgical history, Social history, and Family History were reviewed and updated.  Review of Systems: Review of Systems  Constitutional:  Negative.   HENT:  Negative.  Negative for hearing loss.   Eyes: Negative.  Negative for eye problems.  Respiratory: Negative.    Cardiovascular: Negative.   Gastrointestinal:  Positive for abdominal pain. Negative for constipation.  Endocrine: Negative.   Genitourinary: Negative.    Musculoskeletal: Negative.   Skin: Negative.   Neurological: Negative.        Numbness and tingling of hands and feet  Hematological: Negative.   Psychiatric/Behavioral: Negative.      Physical Exam: Vitals:   02/01/24 0842  BP: 136/86  Pulse: 77  Resp: 18  Temp: 98.6 F (37 C)  SpO2: 98%   Wt Readings from Last 3 Encounters:  02/01/24 199 lb 4 oz (90.4 kg)  01/07/24 209 lb (94.8 kg)  12/27/23 220 lb (99.8  kg)    Physical Exam Vitals reviewed.  HENT:     Head: Normocephalic and atraumatic.  Eyes:     Pupils: Pupils are equal, round, and reactive to light.  Cardiovascular:     Rate and Rhythm: Normal rate and regular rhythm.     Heart sounds: Normal heart sounds.  Pulmonary:     Effort: Pulmonary effort is normal.     Breath sounds: Normal breath sounds.  Abdominal:     General: Bowel sounds are normal.     Palpations: Abdomen is soft.     Comments: Abdominal exam shows a well-healed laparotomy scar.  He has no fluid wave.  There is no guarding or rebound tenderness.    Musculoskeletal:        General: No tenderness or deformity. Normal range of motion.     Cervical back: Normal range of motion.     Comments: Extremities shows improved erythema and decrease in the venous cord in the flexor aspect of the right forearm.    Lymphadenopathy:     Cervical: No cervical adenopathy.  Skin:    General: Skin is warm and dry.     Findings: No erythema or rash.  Neurological:     Mental Status: He is alert and oriented to person, place, and time.  Psychiatric:        Behavior: Behavior normal.        Thought Content: Thought content normal.        Judgment: Judgment normal.     Lab  Results  Component Value Date   WBC 7.2 02/01/2024   HGB 15.2 02/01/2024   HCT 46.1 02/01/2024   MCV 90.9 02/01/2024   PLT 126 (L) 02/01/2024     Chemistry      Component Value Date/Time   NA 140 02/01/2024 0806   K 4.2 02/01/2024 0806   CL 102 02/01/2024 0806   CO2 30 02/01/2024 0806   BUN 15 02/01/2024 0806   CREATININE 1.07 02/01/2024 0806      Component Value Date/Time   CALCIUM  10.3 02/01/2024 0806   ALKPHOS 342 (H) 02/01/2024 0806   AST 45 (H) 02/01/2024 0806   ALT 52 (H) 02/01/2024 0806   BILITOT 0.9 02/01/2024 0806      Impression and Plan:  Mr. Hopfensperger is a very nice 48 year old white male.  He was found to have metastatic colon cancer.  He had been on previous treatment.  He had responded well to treatment.  He unfortunate, began to progress.  He currently is on Enhertu .  He has a rare cancer that is HER2 positive.  Again, I try to convince him to take treatment today.  He says he just wants to hold off on treatment.  Will see about him come back next week for treatment.  I do think that the Enhertu  will help him.  Hopefully, he will have as much in the way of nausea or vomiting.  Hopefully will not have as much in the way of pain.  His LFTs are improving which is nice to see.  I think that he just has had a prolonged response to the Y-90 therapy.  I think this really caused some liver inflammation.  I will plan to see him back myself probably in about 4 weeks.   Ivor Mars, MD 6/3/20259:05 AM

## 2024-02-02 ENCOUNTER — Other Ambulatory Visit (HOSPITAL_BASED_OUTPATIENT_CLINIC_OR_DEPARTMENT_OTHER): Payer: Self-pay

## 2024-02-03 ENCOUNTER — Other Ambulatory Visit: Payer: Self-pay

## 2024-02-03 ENCOUNTER — Other Ambulatory Visit (HOSPITAL_BASED_OUTPATIENT_CLINIC_OR_DEPARTMENT_OTHER): Payer: Self-pay

## 2024-02-04 ENCOUNTER — Encounter: Payer: Self-pay | Admitting: Hematology & Oncology

## 2024-02-04 ENCOUNTER — Inpatient Hospital Stay

## 2024-02-04 ENCOUNTER — Inpatient Hospital Stay: Admitting: Family

## 2024-02-08 ENCOUNTER — Inpatient Hospital Stay

## 2024-02-08 ENCOUNTER — Other Ambulatory Visit (HOSPITAL_BASED_OUTPATIENT_CLINIC_OR_DEPARTMENT_OTHER): Payer: Self-pay

## 2024-02-08 ENCOUNTER — Inpatient Hospital Stay: Admitting: Dietician

## 2024-02-08 ENCOUNTER — Encounter: Payer: Self-pay | Admitting: *Deleted

## 2024-02-08 ENCOUNTER — Other Ambulatory Visit: Payer: Self-pay | Admitting: Hematology & Oncology

## 2024-02-08 MED ORDER — MEGESTROL ACETATE 625 MG/5ML PO SUSP
625.0000 mg | Freq: Every day | ORAL | 4 refills | Status: DC
  Filled 2024-02-08 – 2024-06-22 (×2): qty 150, 30d supply, fill #0

## 2024-02-08 NOTE — Progress Notes (Signed)
 Nutrition Follow Up:   Called patient on mobile in response to MST screen.  Patient reports after last treatment wasn't eating much at all. Started eating again back a week ago. Still very fatigued. Nausea better, still no appetite. Marinol  ordered but he was told by his pharmacy that it was on back order an unavailable until August.  He has been hydrating with milk, OJ, water some Boost (regular).  Just woke up hasn't has anything today.  Only 1 meal yesterday.  Medications: reviewed not using the Olanzapine  either because he hasn't had nausea   Labs: Reviewed 02/01/24   Anthropometrics:  weight loss 21#(9.5%) less than 2 months which is significant for time frame   Height: 69" Weight:  02/01/24  199.3# 12/27/23  220# UBW: 220# BMI: 28.59   Estimated Energy Needs   Kcals: 2600-3000 Protein: 104-130 g Fluid: 3 L     NUTRITION DIAGNOSIS: Inadequate PO intake r/t to pain and recent cancer diagnosis. Ongoing     INTERVENTION:  Encouraged use of Boost plus or VHC TID between attempted feeds  Encouraged increased frequency of feeds and high calorie nutrient dense snacks Will reach out to MD to let know Marinol  unavailable and request alternative appetite stimulant   MONITORING, EVALUATION, GOAL: weight trends, nutrition impact symptoms, PO intake, labs   Goal is weight maintenance   Next Visit: Remote after next treatment   Carleen Chary, RDN, LDN Registered Dietitian, [facility] Part Time Remote (Usual office hours: Tuesday-Thursday) Mobile: [phone]

## 2024-02-08 NOTE — Progress Notes (Signed)
 Patient refuses to come in today for treatment. He agrees to start on 02/22/2024.   Oncology Nurse Navigator Documentation     02/08/2024   11:00 AM  Oncology Nurse Navigator Flowsheets  Navigator Follow Up Date: 02/22/2024  Navigator Follow Up Reason: Follow-up Appointment;Chemotherapy  Navigator Location [facility]  Navigator Encounter Type Appt/Treatment Plan Review  Patient Visit Type MedOnc  Treatment Phase Active Tx  Barriers/Navigation Needs Coordination of Care  Interventions None Required  Acuity Level 1-No Barriers  Time Spent with Patient 15

## 2024-02-09 ENCOUNTER — Other Ambulatory Visit (HOSPITAL_BASED_OUTPATIENT_CLINIC_OR_DEPARTMENT_OTHER): Payer: Self-pay

## 2024-02-09 ENCOUNTER — Other Ambulatory Visit: Payer: Self-pay

## 2024-02-09 MED ORDER — MEGESTROL ACETATE 400 MG/10ML PO SUSP
400.0000 mg | Freq: Every day | ORAL | 3 refills | Status: DC
  Filled 2024-02-09: qty 240, 24d supply, fill #0

## 2024-02-16 ENCOUNTER — Other Ambulatory Visit: Payer: Self-pay | Admitting: Hematology & Oncology

## 2024-02-16 ENCOUNTER — Other Ambulatory Visit (HOSPITAL_BASED_OUTPATIENT_CLINIC_OR_DEPARTMENT_OTHER): Payer: Self-pay

## 2024-02-16 DIAGNOSIS — C189 Malignant neoplasm of colon, unspecified: Secondary | ICD-10-CM

## 2024-02-16 MED ORDER — OXYCODONE HCL 5 MG PO TABS
5.0000 mg | ORAL_TABLET | ORAL | 0 refills | Status: DC | PRN
  Filled 2024-02-16: qty 60, 10d supply, fill #0

## 2024-02-18 ENCOUNTER — Inpatient Hospital Stay

## 2024-02-18 ENCOUNTER — Ambulatory Visit
Admission: RE | Admit: 2024-02-18 | Discharge: 2024-02-18 | Disposition: A | Discharge status: Home / Self Care | Source: Ambulatory Visit | Attending: Urology | Admitting: Urology

## 2024-02-18 ENCOUNTER — Inpatient Hospital Stay: Admitting: Hematology & Oncology

## 2024-02-18 DIAGNOSIS — C189 Malignant neoplasm of colon, unspecified: Secondary | ICD-10-CM

## 2024-02-18 DIAGNOSIS — C787 Secondary malignant neoplasm of liver and intrahepatic bile duct: Secondary | ICD-10-CM

## 2024-02-18 HISTORY — PX: IR RADIOLOGIST EVAL & MGMT: IMG5224

## 2024-02-18 NOTE — Progress Notes (Incomplete)
 Referring Physician(s): Carim,Charles A  Supervising Physician: {Supervising Physician:[id number]}  Chief Complaint: The patient is seen in follow up today s/p ***  History of present illness:  ***  Past Medical History:  Diagnosis Date   Cancer (HCC)    Esophageal ulcer 2007   Renal disorder    CKD 10/28/20 doesn't have it now    Past Surgical History:  Procedure Laterality Date   BIOPSY  11/17/2022   Procedure: BIOPSY;  Surgeon: Sergio Dandy, MD;  Location: [facility];  Service: Gastroenterology;;   COLON RESECTION SIGMOID N/A 11/18/2022   Procedure: OPEN SIGMOID COLECTOMY;  Surgeon: Dareen Ebbing, MD;  Location: [facility] OR;  Service: General;  Laterality: N/A;   COLONOSCOPY WITH PROPOFOL  N/A 11/17/2022   Procedure: COLONOSCOPY WITH PROPOFOL ;  Surgeon: Sergio Dandy, MD;  Location: [facility];  Service: Gastroenterology;  Laterality: N/A;   ESOPHAGOGASTRODUODENOSCOPY (EGD) WITH PROPOFOL  N/A 11/17/2022   Procedure: ESOPHAGOGASTRODUODENOSCOPY (EGD) WITH PROPOFOL ;  Surgeon: Sergio Dandy, MD;  Location: [facility];  Service: Gastroenterology;  Laterality: N/A;   IR 3D INDEPENDENT WKST  12/23/2023   IR ANGIOGRAM SELECTIVE EACH ADDITIONAL VESSEL  12/23/2023   IR ANGIOGRAM SELECTIVE EACH ADDITIONAL VESSEL  12/23/2023   IR ANGIOGRAM SELECTIVE EACH ADDITIONAL VESSEL  12/23/2023   IR ANGIOGRAM VISCERAL SELECTIVE  12/23/2023   IR ANGIOGRAM VISCERAL SELECTIVE  12/23/2023   IR EMBO TUMOR ORGAN ISCHEMIA INFARCT INC GUIDE ROADMAPPING  12/23/2023   IR IMAGING GUIDED PORT INSERTION  12/14/2022   IR RADIOLOGIST EVAL & MGMT  11/18/2023   IR REMOVAL TUN ACCESS W/ PORT W/O FL MOD SED  10/28/2023   IR US  GUIDE VASC ACCESS LEFT  12/23/2023   LIVER BIOPSY N/A 11/18/2022   Procedure: LIVER BIOPSY;  Surgeon: Dareen Ebbing, MD;  Location: [facility] OR;  Service: General;  Laterality: N/A;   SUBMUCOSAL TATTOO INJECTION  11/17/2022   Procedure: SUBMUCOSAL TATTOO INJECTION;  Surgeon: Nandigam, Kavitha  V, MD;  Location: [facility];  Service: Gastroenterology;;    Allergies: Xeloda  [capecitabine ]  Medications: Prior to Admission medications   Medication Sig Start Date End Date Taking? Authorizing Provider  acetaminophen  (TYLENOL ) 500 MG tablet Take 2 tablets (1,000 mg total) by mouth every 6 (six) hours as needed for mild pain. Patient not taking: Reported on 02/01/2024 11/22/22   Kari Otto A, PA-C  dexamethasone  (DECADRON ) 4 MG tablet Take 2 tablets (8 mg) by mouth daily for 3 days starting the day after chemotherapy. Take with food. Patient not taking: Reported on 02/01/2024 11/29/23   Ivor Mars, MD  docusate sodium  (COLACE) 100 MG capsule Take 1 capsule (100 mg total) by mouth 2 (two) times daily. Patient not taking: Reported on 10/20/2023 11/23/22   Akula, Vijaya, MD  dronabinol  (MARINOL ) 5 MG capsule Take 1 capsule (5 mg total) by mouth 2 (two) times daily before a meal. 02/01/24   Ennever, Sherryll Donald, MD  gabapentin  (NEURONTIN ) 300 MG capsule Take 1 capsule (300 mg total) by mouth 3 (three) times daily. 09/17/23 12/16/23  Ivor Mars, MD  lactose free nutrition (BOOST) LIQD Take 237 mLs by mouth daily as needed (Supplement).    [provider]  lactulose  (CHRONULAC ) 10 GM/15ML solution Take 15 mLs (10 g total) by mouth 3 (three) times daily. Patient not taking: Reported on 09/29/2023 12/04/22   Ivor Mars, MD  lidocaine -prilocaine  (EMLA ) cream Apply 1 Application topically as needed. Patient not taking: Reported on 12/17/2023 12/21/22   Ivor Mars, MD  lidocaine -prilocaine  (  EMLA ) cream Apply to affected area once Patient not taking: Reported on 12/17/2023 11/29/23   Ivor Mars, MD  LORazepam  (ATIVAN ) 0.5 MG tablet Place 1 tablet (0.5 mg total) under the tongue every 6 (six) hours as needed for anxiety. 02/01/24   Ivor Mars, MD  megestrol  (MEGACE  ES) 625 MG/5ML suspension Take 5 mLs (625 mg total) by mouth daily. 02/08/24   Ivor Mars, MD  megestrol   (MEGACE ) 400 MG/10ML suspension Take 10 mLs (400 mg total) by mouth daily. 02/09/24   Ivor Mars, MD  methocarbamol  (ROBAXIN ) 500 MG tablet Take 1 tablet (500 mg total) by mouth every 6 (six) hours as needed for muscle spasms. Patient not taking: Reported on 02/01/2024 02/24/23   Ivor Mars, MD  metoCLOPramide  (REGLAN ) 10 MG tablet Take 1 tablet (10 mg total) by mouth every 6 (six) hours. Patient not taking: Reported on 02/01/2024 12/06/23   Afton Horse T, DO  nystatin  (MYCOSTATIN ) 100000 UNIT/ML suspension Take 5 mLs (500,000 Units total) by mouth 4 (four) times daily. Patient not taking: Reported on 02/01/2024 12/16/22   Tish Forge, NP  OLANZapine  (ZYPREXA ) 10 MG tablet Take 1 tablet (10 mg total) by mouth at bedtime. 02/01/24   Ivor Mars, MD  ondansetron  (ZOFRAN ) 8 MG tablet Take 1 tablet (8 mg total) by mouth every 8 (eight) hours as needed for nausea or vomiting. Start on the third day after chemotherapy. Patient not taking: Reported on 02/01/2024 11/29/23   Ivor Mars, MD  ondansetron  (ZOFRAN -ODT) 4 MG disintegrating tablet Take 1 tablet (4 mg total) by mouth every 8 (eight) hours as needed for nausea or vomiting. Patient not taking: Reported on 02/01/2024 12/23/23   Lovena Rubinstein, PA-C  oxyCODONE  (OXY IR/ROXICODONE ) 5 MG immediate release tablet Take 1 tablet (5 mg total) by mouth every 4 (four) hours as needed for severe pain (pain score 7-10). 02/16/24   Ivor Mars, MD  pantoprazole  (PROTONIX ) 40 MG tablet Take 1 tablet (40 mg total) by mouth 2 (two) times daily. Patient not taking: Reported on 02/01/2024 10/04/23   Ivor Mars, MD  polyethylene glycol (MIRALAX  / GLYCOLAX ) 17 g packet Take 17 g by mouth daily as needed for mild constipation. Patient not taking: Reported on 09/29/2023 11/23/22   Akula, Vijaya, MD  prochlorperazine  (COMPAZINE ) 10 MG tablet Take 1 tablet (10 mg total) by mouth every 6 (six) hours as needed for nausea or vomiting. Patient not taking:  Reported on 02/01/2024 11/29/23   Ivor Mars, MD     Family History  Problem Relation Age of Onset   COPD Mother    Cancer Mother     Social History   Socioeconomic History   Marital status: Single    Spouse name: Not on file   Number of children: 0   Years of education: Not on file   Highest education level: Associate degree: occupational, Scientist, product/process development, or vocational program  Occupational History    Comment: NA   Occupation: Curator  Tobacco Use   Smoking status: Every Day    Current packs/day: 1.00    Average packs/day: 1 pack/day for 26.0 years (26.0 ttl pk-yrs)    Types: Cigarettes    Passive exposure: Current   Smokeless tobacco: Never   Tobacco comments:    Smoking half a pack/day  Vaping Use   Vaping status: Former  Substance and Sexual Activity   Alcohol use: Yes    Alcohol/week: 0.0 standard drinks of alcohol  Comment: occ   Drug use: No   Sexual activity: Not Currently  Other Topics Concern   Not on file  Social History Narrative   Not on file   Social Drivers of Health   Financial Resource Strain: Medium Risk (12/02/2022)   Overall Financial Resource Strain (CARDIA)    Difficulty of Paying Living Expenses: Somewhat hard  Food Insecurity: Patient Declined (12/20/2022)   Hunger Vital Sign    Worried About Running Out of Food in the Last Year: Patient declined    Ran Out of Food in the Last Year: Patient declined  Transportation Needs: No Transportation Needs (12/20/2022)   PRAPARE - Administrator, Civil Service (Medical): No    Lack of Transportation (Non-Medical): No  Physical Activity: Not on file  Stress: Not on file  Social Connections: Not on file     Vital Signs: There were no vitals taken for this visit.  Physical Exam  Imaging: No results found.  Labs:  CBC: Recent Labs    12/27/23 0807 01/07/24 0901 01/11/24 1148 02/01/24 0806  WBC 16.5* 5.4 8.4 7.2  HGB 15.7 11.7* 13.7 15.2  HCT 44.2 35.3* 41.2 46.1  PLT  170 339 332 126*    COAGS: Recent Labs    12/23/23 1000  INR 0.9    BMP: Recent Labs    12/27/23 0807 01/07/24 0901 01/11/24 1148 02/01/24 0806  NA 127* 139 138 140  K 4.3 4.7 4.4 4.2  CL 89* 103 100 102  CO2 21* 30 30 30   GLUCOSE 127* 110* 181* 116*  BUN 26* 19 16 15   CALCIUM  9.9 9.7 10.5* 10.3  CREATININE 1.26* 1.10 1.02 1.07  GFRNONAA >60 >60 >60 >60    LIVER FUNCTION TESTS: Recent Labs    12/27/23 0807 01/07/24 0901 01/11/24 1148 02/01/24 0806  BILITOT 1.4* 0.2 0.4 0.9  AST 129* 51* 39 45*  ALT 295* 87* 66* 52*  ALKPHOS 222* 371* 407* 342*  PROT 7.8 6.7 7.6 7.9  ALBUMIN  3.9 3.4* 3.9 4.3    Assessment and Plan:  ***  Electronically Signed: Art Largo 02/18/2024, 10:02 AM   I spent a total of {Established Out-Pt:[id number]} in face to face in clinical consultation, greater than 50% of which was counseling/coordinating care for ***

## 2024-02-20 ENCOUNTER — Other Ambulatory Visit: Payer: Self-pay

## 2024-02-22 ENCOUNTER — Inpatient Hospital Stay

## 2024-02-22 ENCOUNTER — Inpatient Hospital Stay: Admitting: Hematology & Oncology

## 2024-02-22 DIAGNOSIS — C189 Malignant neoplasm of colon, unspecified: Secondary | ICD-10-CM

## 2024-02-24 ENCOUNTER — Inpatient Hospital Stay: Admitting: Dietician

## 2024-02-24 NOTE — Progress Notes (Signed)
 Nutrition Follow Up:   Called patient on mobile.  Appointment was scheduled to assess PO intake after last cycle of treatment but treatment ws reschedule for tomorrow.  No new labs or weight data to assess.  Patient had prescriptions ready for both Marinol  and Megace  when he went to the pharmacy.  He states he has been taking the Marinol  but not noticing any improvement in appetite.  He also reports that his main barrier to increasing PO intake is r/t to pain and less about his lack of appetite.  He reports if he eats 1-2 bites of solid food he has pain 6-7 on scale of ten.  Tried to probe  to get a better understanding of what pain was and where.  He just responded  Can't describe it, but it only happens with eating.  He is not having pain with liquids, and admits he has not altered his ONS as we discussed last consult. He says he's trying to use up the Boost products he has but he is only drinking 1-2 bottles a day, and continues hydrating with juice and milks.  Offered recipes for home blended shakes for him to make with his blender, he decline stating he has a book of recipes that he can use.   Medications: reviewed not using the Olanzapine  either because he hasn't had nausea   Labs: Reviewed 02/01/24   Anthropometrics:  weight loss 21#(9.5%) less than 2 months which is significant for time frame   Height: 69 Weight:  02/01/24  199.3# 12/27/23  220# UBW: 220# BMI: 28.59   Estimated Energy Needs   Kcals: 2600-3000 Protein: 104-130 g Fluid: 3 L     NUTRITION DIAGNOSIS: Inadequate PO intake r/t to pain and recent cancer diagnosis. Ongoing     INTERVENTION:   Encouraged increasing current ONS to 5-6 bottles per day or use of recipes to increase caloric density of ONS or use of Boost plus or VHC QID as main source of nutrients in addition to other high calorie beverages, Encouraged discussing pain with MD at tomorrow f/u  Encouraged trial of the Megace  to see if appetite stimulant works  better.   MONITORING, EVALUATION, GOAL: weight trends, nutrition impact symptoms, PO intake, labs   Goal is weight maintenance   Next Visit: Remote after next treatment   Micheline Craven, RDN, LDN Registered Dietitian, [facility] Part Time Remote (Usual office hours: Tuesday-Thursday) Mobile: [phone]

## 2024-02-25 ENCOUNTER — Inpatient Hospital Stay

## 2024-02-25 ENCOUNTER — Inpatient Hospital Stay (HOSPITAL_BASED_OUTPATIENT_CLINIC_OR_DEPARTMENT_OTHER): Admitting: Hematology & Oncology

## 2024-02-25 ENCOUNTER — Ambulatory Visit: Payer: Self-pay | Admitting: Hematology & Oncology

## 2024-02-25 ENCOUNTER — Encounter: Payer: Self-pay | Admitting: Hematology & Oncology

## 2024-02-25 ENCOUNTER — Encounter: Payer: Self-pay | Admitting: *Deleted

## 2024-02-25 ENCOUNTER — Inpatient Hospital Stay: Admitting: Hematology & Oncology

## 2024-02-25 VITALS — BP 117/64 | HR 73 | Temp 98.3°F | Resp 18 | Ht 70.0 in | Wt 202.1 lb

## 2024-02-25 VITALS — BP 110/65 | HR 55 | Temp 98.2°F | Resp 18

## 2024-02-25 DIAGNOSIS — C189 Malignant neoplasm of colon, unspecified: Secondary | ICD-10-CM

## 2024-02-25 DIAGNOSIS — Z5112 Encounter for antineoplastic immunotherapy: Secondary | ICD-10-CM | POA: Diagnosis not present

## 2024-02-25 DIAGNOSIS — C787 Secondary malignant neoplasm of liver and intrahepatic bile duct: Secondary | ICD-10-CM

## 2024-02-25 LAB — CBC WITH DIFFERENTIAL (CANCER CENTER ONLY)
Abs Immature Granulocytes: 0.01 10*3/uL (ref 0.00–0.07)
Basophils Absolute: 0 10*3/uL (ref 0.0–0.1)
Basophils Relative: 0 %
Eosinophils Absolute: 0.3 10*3/uL (ref 0.0–0.5)
Eosinophils Relative: 4 %
HCT: 47 % (ref 39.0–52.0)
Hemoglobin: 15.4 g/dL (ref 13.0–17.0)
Immature Granulocytes: 0 %
Lymphocytes Relative: 26 %
Lymphs Abs: 1.7 10*3/uL (ref 0.7–4.0)
MCH: 29.8 pg (ref 26.0–34.0)
MCHC: 32.8 g/dL (ref 30.0–36.0)
MCV: 90.9 fL (ref 80.0–100.0)
Monocytes Absolute: 0.3 10*3/uL (ref 0.1–1.0)
Monocytes Relative: 5 %
Neutro Abs: 4.3 10*3/uL (ref 1.7–7.7)
Neutrophils Relative %: 65 %
Platelet Count: 122 10*3/uL — ABNORMAL LOW (ref 150–400)
RBC: 5.17 MIL/uL (ref 4.22–5.81)
RDW: 14.2 % (ref 11.5–15.5)
WBC Count: 6.6 10*3/uL (ref 4.0–10.5)
nRBC: 0 % (ref 0.0–0.2)

## 2024-02-25 LAB — CMP (CANCER CENTER ONLY)
ALT: 58 U/L — ABNORMAL HIGH (ref 0–44)
AST: 51 U/L — ABNORMAL HIGH (ref 15–41)
Albumin: 4.2 g/dL (ref 3.5–5.0)
Alkaline Phosphatase: 305 U/L — ABNORMAL HIGH (ref 38–126)
Anion gap: 10 (ref 5–15)
BUN: 23 mg/dL — ABNORMAL HIGH (ref 6–20)
CO2: 27 mmol/L (ref 22–32)
Calcium: 9.8 mg/dL (ref 8.9–10.3)
Chloride: 105 mmol/L (ref 98–111)
Creatinine: 1.14 mg/dL (ref 0.61–1.24)
GFR, Estimated: >60 mL/min (ref >60)
Glucose, Bld: 159 mg/dL — ABNORMAL HIGH (ref 70–99)
Potassium: 4.1 mmol/L (ref 3.5–5.1)
Sodium: 142 mmol/L (ref 135–145)
Total Bilirubin: 0.9 mg/dL (ref 0.0–1.2)
Total Protein: 6.8 g/dL (ref 6.5–8.1)

## 2024-02-25 LAB — FERRITIN: Ferritin: 825 ng/mL — ABNORMAL HIGH (ref 24–336)

## 2024-02-25 LAB — CEA (ACCESS): CEA (CHCC): 22.45 ng/mL — ABNORMAL HIGH (ref 0.00–5.00)

## 2024-02-25 LAB — IRON AND IRON BINDING CAPACITY (CC-WL,HP ONLY)
Iron: 170 ug/dL (ref 45–182)
Saturation Ratios: 57 % — ABNORMAL HIGH (ref 17.9–39.5)
TIBC: 300 ug/dL (ref 250–450)
UIBC: 130 ug/dL (ref 117–376)

## 2024-02-25 LAB — RETICULOCYTES
Immature Retic Fract: 3.5 % (ref 2.3–15.9)
RBC.: 5.1 MIL/uL (ref 4.22–5.81)
Retic Count, Absolute: 50 10*3/uL (ref 19.0–186.0)
Retic Ct Pct: 1 % (ref 0.4–3.1)

## 2024-02-25 MED ORDER — FAM-TRASTUZUMAB DERUXTECAN-NXKI CHEMO 100 MG IV SOLR
5.4000 mg/kg | Freq: Once | INTRAVENOUS | Status: AC
  Administered 2024-02-25: 500 mg via INTRAVENOUS
  Filled 2024-02-25: qty 25

## 2024-02-25 MED ORDER — ACETAMINOPHEN 325 MG PO TABS
650.0000 mg | ORAL_TABLET | Freq: Once | ORAL | Status: AC
Start: 2024-02-25 — End: 2024-02-25
  Administered 2024-02-25: 650 mg via ORAL
  Filled 2024-02-25: qty 2

## 2024-02-25 MED ORDER — DIPHENHYDRAMINE HCL 25 MG PO CAPS
50.0000 mg | ORAL_CAPSULE | Freq: Once | ORAL | Status: AC
  Administered 2024-02-25: 50 mg via ORAL
  Filled 2024-02-25: qty 2

## 2024-02-25 MED ORDER — PALONOSETRON HCL INJECTION 0.25 MG/5ML
0.2500 mg | Freq: Once | INTRAVENOUS | Status: AC
  Administered 2024-02-25: 0.25 mg via INTRAVENOUS
  Filled 2024-02-25: qty 5

## 2024-02-25 MED ORDER — DEXTROSE 5 % IV SOLN: INTRAVENOUS | Status: DC

## 2024-02-25 MED ORDER — DEXAMETHASONE SODIUM PHOSPHATE 10 MG/ML IJ SOLN
10.0000 mg | Freq: Once | INTRAMUSCULAR | Status: AC
  Administered 2024-02-25: 10 mg via INTRAVENOUS
  Filled 2024-02-25: qty 1

## 2024-02-25 MED ORDER — SODIUM CHLORIDE 0.9 % IV SOLN
150.0000 mg | Freq: Once | INTRAVENOUS | Status: AC
  Administered 2024-02-25: 150 mg via INTRAVENOUS
  Filled 2024-02-25: qty 150

## 2024-02-25 NOTE — Patient Instructions (Signed)
 Fam-Trastuzumab Deruxtecan Injection What is this medication? FAM-TRASTUZUMAB DERUXTECAN (fam-tras TOOZ eu mab DER ux TEE kan) treats some types of cancer. It works by blocking a protein that causes cancer cells to grow and multiply. This helps to slow or stop the spread of cancer cells. This medicine may be used for other purposes; ask your health care provider or pharmacist if you have questions. COMMON BRAND NAME(S): ENHERTU What should I tell my care team before I take this medication? They need to know if you have any of these conditions: Heart disease Heart failure Infection, especially a viral infection, such as chickenpox, cold sores, or herpes Liver disease Lung or breathing disease, such as asthma or COPD An unusual or allergic reaction to fam-trastuzumab deruxtecan, other medications, foods, dyes, or preservatives Pregnant or trying to get pregnant Breast-feeding How should I use this medication? This medication is injected into a vein. It is given by your care team in a hospital or clinic setting. A special MedGuide will be given to you before each treatment. Be sure to read this information carefully each time. Talk to your care team about the use of this medication in children. Special care may be needed. Overdosage: If you think you have taken too much of this medicine contact a poison control center or emergency room at once. NOTE: This medicine is only for you. Do not share this medicine with others. What if I miss a dose? It is important not to miss your dose. Call your care team if you are unable to keep an appointment. What may interact with this medication? Interactions are not expected. This list may not describe all possible interactions. Give your health care provider a list of all the medicines, herbs, non-prescription drugs, or dietary supplements you use. Also tell them if you smoke, drink alcohol, or use illegal drugs. Some items may interact with your  medicine. What should I watch for while using this medication? Visit your care team for regular checks on your progress. Tell your care team if your symptoms do not start to get better or if they get worse. This medication may increase your risk of getting an infection. Call your care team for advice if you get a fever, chills, sore throat, or other symptoms of a cold or flu. Do not treat yourself. Try to avoid being around people who are sick. Avoid taking medications that contain aspirin, acetaminophen, ibuprofen, naproxen, or ketoprofen unless instructed by your care team. These medications may hide a fever. Be careful brushing or flossing your teeth or using a toothpick because you may get an infection or bleed more easily. If you have any dental work done, tell your dentist you are receiving this medication. This medication may cause dry eyes and blurred vision. If you wear contact lenses, you may feel some discomfort. Lubricating eye drops may help. See your care team if the problem does not go away or is severe. Talk to your care team if you may be pregnant. Serious birth defects can occur if you take this medication during pregnancy and for 7 months after the last dose. If your partner can get pregnant, use a condom during sex while taking this medication and for 4 months after the last dose. Do not breastfeed while taking this medication and for 7 months after the last dose. This medication may cause infertility. Talk to your care team if you are concerned about your fertility. What side effects may I notice from receiving this medication? Side effects  that you should report to your care team as soon as possible: Allergic reactions--skin rash, itching, hives, swelling of the face, lips, tongue, or throat Dry cough, shortness of breath or trouble breathing Infection--fever, chills, cough, sore throat, wounds that don't heal, pain or trouble when passing urine, general feeling of discomfort or  being unwell Heart failure--shortness of breath, swelling of the ankles, feet, or hands, sudden weight gain, unusual weakness or fatigue Unusual bruising or bleeding Side effects that usually do not require medical attention (report these to your care team if they continue or are bothersome): Constipation Diarrhea Hair loss Muscle pain Nausea Vomiting This list may not describe all possible side effects. Call your doctor for medical advice about side effects. You may report side effects to FDA at 1-800-FDA-1088. Where should I keep my medication? This medication is given in a hospital or clinic. It will not be stored at home. NOTE: This sheet is a summary. It may not cover all possible information. If you have questions about this medicine, talk to your doctor, pharmacist, or health care provider.  2024 Elsevier/Gold Standard (2023-04-16 00:00:00)

## 2024-02-25 NOTE — Progress Notes (Signed)
 Hematology and Oncology Follow Up Visit  CORDEL DREWES [id number] November 04, 1975 48 y.o. 02/25/2024  Principle Diagnosis:  Metastatic adenocarcinoma of the colon-liver metastasis -- pMMR/MSI-low -- (+) KRAS/ ERBB2(+)       Iron deficiency anemia      Superficial thrombus of the right forearm  Current Therapy:   FOLFOXIRI - s/p cycle #11  - start on  -- 12/09/2022 --oxaliplatin  dropped after 04/19/2023 IV iron as indicated Xeloda /Avastin  -- s/p cycle #1 on 09/13/2023 --Avastin  on hold.  -Xeloda  d/c'ed on 10/15/2023 Enhertu  - s/p cycle #2 - start on 11/26/2023 Intrahepatic Y-90 -first dose on 12/23/2023 Xarelto  10 mg p.o. daily-started on 12/19/2023     Interim History:  Mr. Isham is back for follow-up.  He finally has agreed to take the Enhertu  again.  Is been 2 months since he had the Enhertu .  He has been having some issues with his liver.  I am not sure if this is all reflective of the intrahepatic therapy that he had.  His CEA has gone up from the 14 to 22.  He is still smoking.  He is going to use some Nicorette gum to try to stop smoking.  His appetite is doing better.  He has had no problems with bowels or bladder.  He has had no bleeding.  He has had no rashes.  He has had no leg swelling.  He has had no cough.  He had iron studies done today.  His ferritin is 825 with iron saturation of 57%.  Overall, I would say that his performance status is probably ECOG 1.   .    Wt Readings from Last 3 Encounters:  02/25/24 202 lb 1.9 oz (91.7 kg)  02/01/24 199 lb 4 oz (90.4 kg)  01/07/24 209 lb (94.8 kg)    Medications:  Current Outpatient Medications:    acetaminophen  (TYLENOL ) 500 MG tablet, Take 2 tablets (1,000 mg total) by mouth every 6 (six) hours as needed for mild pain., Disp: 30 tablet, Rfl: 0   dronabinol  (MARINOL ) 5 MG capsule, Take 1 capsule (5 mg total) by mouth 2 (two) times daily before a meal., Disp: 60 capsule, Rfl: 0   lactose free nutrition (BOOST) LIQD, Take 237  mLs by mouth daily as needed (Supplement)., Disp: , Rfl:    LORazepam  (ATIVAN ) 0.5 MG tablet, Place 1 tablet (0.5 mg total) under the tongue every 6 (six) hours as needed for anxiety., Disp: 60 tablet, Rfl: 0   oxyCODONE  (OXY IR/ROXICODONE ) 5 MG immediate release tablet, Take 1 tablet (5 mg total) by mouth every 4 (four) hours as needed for severe pain (pain score 7-10)., Disp: 60 tablet, Rfl: 0   dexamethasone  (DECADRON ) 4 MG tablet, Take 2 tablets (8 mg) by mouth daily for 3 days starting the day after chemotherapy. Take with food. (Patient not taking: Reported on 02/25/2024), Disp: 30 tablet, Rfl: 1   docusate sodium  (COLACE) 100 MG capsule, Take 1 capsule (100 mg total) by mouth 2 (two) times daily. (Patient not taking: Reported on 02/25/2024), Disp: 10 capsule, Rfl: 0   gabapentin  (NEURONTIN ) 300 MG capsule, Take 1 capsule (300 mg total) by mouth 3 (three) times daily., Disp: 90 capsule, Rfl: 2   lactulose  (CHRONULAC ) 10 GM/15ML solution, Take 15 mLs (10 g total) by mouth 3 (three) times daily. (Patient not taking: Reported on 02/25/2024), Disp: 236 mL, Rfl: 0   lidocaine -prilocaine  (EMLA ) cream, Apply 1 Application topically as needed. (Patient not taking: Reported on 02/25/2024), Disp: 30 g,  Rfl: 0   lidocaine -prilocaine  (EMLA ) cream, Apply to affected area once (Patient not taking: Reported on 02/25/2024), Disp: 30 g, Rfl: 3   megestrol  (MEGACE  ES) 625 MG/5ML suspension, Take 5 mLs (625 mg total) by mouth daily. (Patient not taking: Reported on 02/25/2024), Disp: 150 mL, Rfl: 4   megestrol  (MEGACE ) 400 MG/10ML suspension, Take 10 mLs (400 mg total) by mouth daily. (Patient not taking: Reported on 02/25/2024), Disp: 240 mL, Rfl: 3   methocarbamol  (ROBAXIN ) 500 MG tablet, Take 1 tablet (500 mg total) by mouth every 6 (six) hours as needed for muscle spasms. (Patient not taking: Reported on 02/25/2024), Disp: 30 tablet, Rfl: 0   metoCLOPramide  (REGLAN ) 10 MG tablet, Take 1 tablet (10 mg total) by mouth  every 6 (six) hours. (Patient not taking: Reported on 02/25/2024), Disp: 30 tablet, Rfl: 0   nystatin  (MYCOSTATIN ) 100000 UNIT/ML suspension, Take 5 mLs (500,000 Units total) by mouth 4 (four) times daily. (Patient not taking: Reported on 02/25/2024), Disp: 473 mL, Rfl: 1   OLANZapine  (ZYPREXA ) 10 MG tablet, Take 1 tablet (10 mg total) by mouth at bedtime. (Patient not taking: Reported on 02/25/2024), Disp: 30 tablet, Rfl: 6   ondansetron  (ZOFRAN ) 8 MG tablet, Take 1 tablet (8 mg total) by mouth every 8 (eight) hours as needed for nausea or vomiting. Start on the third day after chemotherapy. (Patient not taking: Reported on 02/25/2024), Disp: 30 tablet, Rfl: 1   ondansetron  (ZOFRAN -ODT) 4 MG disintegrating tablet, Take 1 tablet (4 mg total) by mouth every 8 (eight) hours as needed for nausea or vomiting. (Patient not taking: Reported on 02/25/2024), Disp: 20 tablet, Rfl: 0   pantoprazole  (PROTONIX ) 40 MG tablet, Take 1 tablet (40 mg total) by mouth 2 (two) times daily. (Patient not taking: Reported on 02/25/2024), Disp: 60 tablet, Rfl: 4   polyethylene glycol (MIRALAX  / GLYCOLAX ) 17 g packet, Take 17 g by mouth daily as needed for mild constipation. (Patient not taking: Reported on 02/25/2024), Disp: 14 each, Rfl: 0   prochlorperazine  (COMPAZINE ) 10 MG tablet, Take 1 tablet (10 mg total) by mouth every 6 (six) hours as needed for nausea or vomiting. (Patient not taking: Reported on 02/25/2024), Disp: 30 tablet, Rfl: 1  Allergies:  Allergies  Allergen Reactions   Xeloda  [Capecitabine ] Nausea And Vomiting and Other (See Comments)    Chest pain    Past Medical History, Surgical history, Social history, and Family History were reviewed and updated.  Review of Systems: Review of Systems  Constitutional: Negative.   HENT:  Negative.  Negative for hearing loss.   Eyes: Negative.  Negative for eye problems.  Respiratory: Negative.    Cardiovascular: Negative.   Gastrointestinal:  Positive for abdominal  pain. Negative for constipation.  Endocrine: Negative.   Genitourinary: Negative.    Musculoskeletal: Negative.   Skin: Negative.   Neurological: Negative.        Numbness and tingling of hands and feet  Hematological: Negative.   Psychiatric/Behavioral: Negative.      Physical Exam: Vitals:   02/25/24 1135  BP: 117/64  Pulse: 73  Resp: 18  Temp: 98.3 F (36.8 C)  SpO2: 98%   Wt Readings from Last 3 Encounters:  02/25/24 202 lb 1.9 oz (91.7 kg)  02/01/24 199 lb 4 oz (90.4 kg)  01/07/24 209 lb (94.8 kg)    Physical Exam Vitals reviewed.  HENT:     Head: Normocephalic and atraumatic.   Eyes:     Pupils: Pupils are equal, round, and  reactive to light.    Cardiovascular:     Rate and Rhythm: Normal rate and regular rhythm.     Heart sounds: Normal heart sounds.  Pulmonary:     Effort: Pulmonary effort is normal.     Breath sounds: Normal breath sounds.  Abdominal:     General: Bowel sounds are normal.     Palpations: Abdomen is soft.     Comments: Abdominal exam shows a well-healed laparotomy scar.  He has no fluid wave.  There is no guarding or rebound tenderness.     Musculoskeletal:        General: No tenderness or deformity. Normal range of motion.     Cervical back: Normal range of motion.     Comments: Extremities shows improved erythema and decrease in the venous cord in the flexor aspect of the right forearm.    Lymphadenopathy:     Cervical: No cervical adenopathy.   Skin:    General: Skin is warm and dry.     Findings: No erythema or rash.   Neurological:     Mental Status: He is alert and oriented to person, place, and time.   Psychiatric:        Behavior: Behavior normal.        Thought Content: Thought content normal.        Judgment: Judgment normal.     Lab Results  Component Value Date   WBC 6.6 02/25/2024   HGB 15.4 02/25/2024   HCT 47.0 02/25/2024   MCV 90.9 02/25/2024   PLT 122 (L) 02/25/2024     Chemistry      Component  Value Date/Time   NA 142 02/25/2024 1038   K 4.1 02/25/2024 1038   CL 105 02/25/2024 1038   CO2 27 02/25/2024 1038   BUN 23 (H) 02/25/2024 1038   CREATININE 1.14 02/25/2024 1038      Component Value Date/Time   CALCIUM  9.8 02/25/2024 1038   ALKPHOS 305 (H) 02/25/2024 1038   AST 51 (H) 02/25/2024 1038   ALT 58 (H) 02/25/2024 1038   BILITOT 0.9 02/25/2024 1038      Impression and Plan:  Mr. Blaze is a very nice 48 year old white male.  He was found to have metastatic colon cancer.  He had been on previous treatment.  He had responded well to treatment.  He unfortunately, began to progress.  He currently is on Enhertu .  He has a rare cancer that is HER2 positive.  He has been very reluctant to take treatment.  However, he will take treatment now.   Hopefully, we will see that the CEA will come down.  I probably would not do another scan on him probably until August.   Maude JONELLE Crease, MD 6/27/202512:08 PM

## 2024-02-25 NOTE — Progress Notes (Unsigned)
 Patient did come in to be seen. He proceeded with cycle three after two months of delay, mostly by patient choice. His tumor markers are rising.   Oncology Nurse Navigator Documentation     02/25/2024   10:30 AM  Oncology Nurse Navigator Flowsheets  Navigator Follow Up Date: 03/14/2024  Navigator Follow Up Reason: Follow-up Appointment;Chemotherapy  Navigator Location [facility]  Navigator Encounter Type Treatment  Patient Visit Type MedOnc  Treatment Phase Active Tx  Barriers/Navigation Needs Coordination of Care  Interventions Psycho-Social Support  Acuity Level 1-No Barriers  Time Spent with Patient 15

## 2024-02-25 NOTE — Progress Notes (Signed)
 Patient is a no-show to today's appointment. Called and spoke to patient. He states he overslept. He can still come in today.  Overall patient still feels poorly. He continues to have pain to his abdomen. He feels it's related to his TACE treatment, however this is unusual. The pain impacts his ability to eat. He doesn't feel that he has significant nausea. He is able to drink fluids without ill effect.   Spoke with Dr Timmy, he will see patient today. Patient is rescheduled for 10:30 today.   Oncology Nurse Navigator Documentation     02/25/2024    9:00 AM  Oncology Nurse Navigator Flowsheets  Navigator Location [facility]  Navigator Encounter Type Telephone  Telephone Outgoing Call  Patient Visit Type MedOnc  Treatment Phase Active Tx  Barriers/Navigation Needs Coordination of Care  Interventions Coordination of Care;Psycho-Social Support  Acuity Level 1-No Barriers  Coordination of Care Appts  Education Method Verbal  Time Spent with Patient 15

## 2024-02-26 ENCOUNTER — Other Ambulatory Visit: Payer: Self-pay

## 2024-02-28 ENCOUNTER — Encounter: Payer: Self-pay | Admitting: Hematology & Oncology

## 2024-02-29 ENCOUNTER — Inpatient Hospital Stay

## 2024-02-29 ENCOUNTER — Inpatient Hospital Stay: Admitting: Hematology & Oncology

## 2024-03-01 ENCOUNTER — Other Ambulatory Visit: Payer: Self-pay | Admitting: Hematology & Oncology

## 2024-03-01 ENCOUNTER — Other Ambulatory Visit (HOSPITAL_BASED_OUTPATIENT_CLINIC_OR_DEPARTMENT_OTHER): Payer: Self-pay

## 2024-03-01 DIAGNOSIS — C189 Malignant neoplasm of colon, unspecified: Secondary | ICD-10-CM

## 2024-03-01 MED ORDER — OXYCODONE HCL 5 MG PO TABS
5.0000 mg | ORAL_TABLET | ORAL | 0 refills | Status: DC | PRN
  Filled 2024-03-01: qty 60, 10d supply, fill #0

## 2024-03-14 ENCOUNTER — Telehealth: Payer: Self-pay | Admitting: Hematology & Oncology

## 2024-03-14 ENCOUNTER — Encounter: Payer: Self-pay | Admitting: *Deleted

## 2024-03-14 ENCOUNTER — Inpatient Hospital Stay

## 2024-03-14 ENCOUNTER — Inpatient Hospital Stay: Admitting: Medical Oncology

## 2024-03-14 NOTE — Progress Notes (Signed)
 Patient called and rescheduled his appointment. He is now working and can only come into the office on Fridays.   Oncology Nurse Navigator Documentation     03/14/2024    9:45 AM  Oncology Nurse Navigator Flowsheets  Navigator Follow Up Date: 03/24/2024  Navigator Follow Up Reason: Follow-up Appointment;Chemotherapy  Navigator Location [facility]  Navigator Encounter Type Appt/Treatment Plan Review  Patient Visit Type MedOnc  Treatment Phase Active Tx  Barriers/Navigation Needs Coordination of Care  Interventions None Required  Acuity Level 1-No Barriers  Time Spent with Patient 15

## 2024-03-14 NOTE — Telephone Encounter (Signed)
 pt requested to move tx to 07/25 as he has went back to work part time and he needs Friday appointments. No availability with a provider for 07/18 so pt requested 07/25.

## 2024-03-17 ENCOUNTER — Other Ambulatory Visit: Payer: Self-pay

## 2024-03-17 ENCOUNTER — Other Ambulatory Visit: Payer: Self-pay | Admitting: Hematology & Oncology

## 2024-03-17 ENCOUNTER — Other Ambulatory Visit (HOSPITAL_BASED_OUTPATIENT_CLINIC_OR_DEPARTMENT_OTHER): Payer: Self-pay

## 2024-03-17 DIAGNOSIS — C787 Secondary malignant neoplasm of liver and intrahepatic bile duct: Secondary | ICD-10-CM

## 2024-03-17 MED ORDER — OXYCODONE HCL 5 MG PO TABS
5.0000 mg | ORAL_TABLET | ORAL | 0 refills | Status: DC | PRN
  Filled 2024-03-17: qty 60, 10d supply, fill #0

## 2024-03-17 NOTE — Addendum Note (Signed)
 Encounter addended by: Jarold Nest on: 03/17/2024 11:04 AM  Actions taken: Imaging Exam ended

## 2024-03-20 ENCOUNTER — Other Ambulatory Visit (HOSPITAL_BASED_OUTPATIENT_CLINIC_OR_DEPARTMENT_OTHER): Payer: Self-pay

## 2024-03-20 NOTE — Progress Notes (Signed)
 Reason for visit: Metastatic colon cancer. F/u s/p TACE    Care Team(s): PCP; Lorren Greig PARAS, NP Hematology Oncology; Timmy Maude SAUNDERS MD  Virtual Visit via Telephone Note   I connected with Richard Bowman on 02/18/24 by telephone and verified that I am speaking with the correct person using two identifiers. I discussed the limitations, risks, security and privacy concerns of performing an evaluation and management service by telephone and the availability of in-person appointments.   History of Present Illness:   Richard Bowman is a 48 y.o. male w PMHx significant for smoking, previously healthy and diagnosed with metastatic colon CA on 11/17/22 after he presented to ER w abdominal pain. Investigations including CT CAP was revealing for hepatic metastases w suspected primary malignancy at the sigmoid colon. Diagnosis confirmed on colonoscopy. He is known to VIR s/p port placement (12/14/22, [place]) and more recently removal (10/28/23, [place]). He is closely followed by medical oncology and Dr Timmy, with treatments including chemoRx with FOLFOXIRI q14 days, s/p 12 cycles completed (4/17 - 08/27/23), then on bevacizumab  (q21 days and began on 09/13/23), though reportedly completed a single treatment and cycle 2 deferred secondary to RUQ discomfort.    Given his disease burden in liver, lhe was recommended for ocoregional therapy. He is known to me s/p TACE  on 12/23/23 for additional hepatic metastatic burden control. His Avastin  had been held to prevent vascular complications during trans-arterial embolization. He reports no current discomfort after TACE  but had initially experienced RUQ discomfort significant enough to present to ER at [facility]. He was treated conservatively with PO analgesia.     Review of Systems: A 12-point ROS discussed, and pertinent positives are indicated in the HPI above.  All other systems are negative.   Past Medical History:  Diagnosis Date   Cancer [facility])     Esophageal ulcer 2007   Renal disorder    CKD 10/28/20 doesn't have it now    Past Surgical History:  Procedure Laterality Date   BIOPSY  11/17/2022   Procedure: BIOPSY;  Surgeon: Shila Gustav GAILS, MD;  Location: [facility] ENDOSCOPY;  Service: Gastroenterology;;   COLON RESECTION SIGMOID N/A 11/18/2022   Procedure: OPEN SIGMOID COLECTOMY;  Surgeon: Belinda Cough, MD;  Location: [facility] OR;  Service: General;  Laterality: N/A;   COLONOSCOPY WITH PROPOFOL  N/A 11/17/2022   Procedure: COLONOSCOPY WITH PROPOFOL ;  Surgeon: Shila Gustav GAILS, MD;  Location: [facility] ENDOSCOPY;  Service: Gastroenterology;  Laterality: N/A;   ESOPHAGOGASTRODUODENOSCOPY (EGD) WITH PROPOFOL  N/A 11/17/2022   Procedure: ESOPHAGOGASTRODUODENOSCOPY (EGD) WITH PROPOFOL ;  Surgeon: Shila Gustav GAILS, MD;  Location: [facility] ENDOSCOPY;  Service: Gastroenterology;  Laterality: N/A;   IR 3D INDEPENDENT WKST  12/23/2023   IR ANGIOGRAM SELECTIVE EACH ADDITIONAL VESSEL  12/23/2023   IR ANGIOGRAM SELECTIVE EACH ADDITIONAL VESSEL  12/23/2023   IR ANGIOGRAM SELECTIVE EACH ADDITIONAL VESSEL  12/23/2023   IR ANGIOGRAM VISCERAL SELECTIVE  12/23/2023   IR ANGIOGRAM VISCERAL SELECTIVE  12/23/2023   IR EMBO TUMOR ORGAN ISCHEMIA INFARCT INC GUIDE ROADMAPPING  12/23/2023   IR IMAGING GUIDED PORT INSERTION  12/14/2022   IR RADIOLOGIST EVAL & MGMT  11/18/2023   IR RADIOLOGIST EVAL & MGMT  02/18/2024   IR REMOVAL TUN ACCESS W/ PORT W/O FL MOD SED  10/28/2023   IR US  GUIDE VASC ACCESS LEFT  12/23/2023   LIVER BIOPSY N/A 11/18/2022   Procedure: LIVER BIOPSY;  Surgeon: Belinda Cough, MD;  Location: [facility] OR;  Service: General;  Laterality: N/A;   SUBMUCOSAL  TATTOO INJECTION  11/17/2022   Procedure: SUBMUCOSAL TATTOO INJECTION;  Surgeon: Nandigam, Kavitha V, MD;  Location: [facility];  Service: Gastroenterology;;    Allergies: Xeloda  [capecitabine ]  Medications: Prior to Admission medications   Medication Sig Start Date End Date Taking? Authorizing Provider   acetaminophen  (TYLENOL ) 500 MG tablet Take 2 tablets (1,000 mg total) by mouth every 6 (six) hours as needed for mild pain. 11/22/22   Meuth, Brooke A, PA-C  dexamethasone  (DECADRON ) 4 MG tablet Take 2 tablets (8 mg) by mouth daily for 3 days starting the day after chemotherapy. Take with food. Patient not taking: Reported on 02/25/2024 11/29/23   Timmy Maude SAUNDERS, MD  docusate sodium  (COLACE) 100 MG capsule Take 1 capsule (100 mg total) by mouth 2 (two) times daily. Patient not taking: Reported on 02/25/2024 11/23/22   Akula, Vijaya, MD  dronabinol  (MARINOL ) 5 MG capsule Take 1 capsule (5 mg total) by mouth 2 (two) times daily before a meal. 02/01/24   Ennever, Maude SAUNDERS, MD  gabapentin  (NEURONTIN ) 300 MG capsule Take 1 capsule (300 mg total) by mouth 3 (three) times daily. 09/17/23 12/16/23  Timmy Maude SAUNDERS, MD  lactose free nutrition (BOOST) LIQD Take 237 mLs by mouth daily as needed (Supplement).    [provider]  lactulose  (CHRONULAC ) 10 GM/15ML solution Take 15 mLs (10 g total) by mouth 3 (three) times daily. Patient not taking: Reported on 02/25/2024 12/04/22   Timmy Maude SAUNDERS, MD  lidocaine -prilocaine  (EMLA ) cream Apply 1 Application topically as needed. Patient not taking: Reported on 02/25/2024 12/21/22   Timmy Maude SAUNDERS, MD  lidocaine -prilocaine  (EMLA ) cream Apply to affected area once Patient not taking: Reported on 02/25/2024 11/29/23   Timmy Maude SAUNDERS, MD  LORazepam  (ATIVAN ) 0.5 MG tablet Place 1 tablet (0.5 mg total) under the tongue every 6 (six) hours as needed for anxiety. 02/01/24   Timmy Maude SAUNDERS, MD  megestrol  (MEGACE  ES) 625 MG/5ML suspension Take 5 mLs (625 mg total) by mouth daily. Patient not taking: Reported on 02/25/2024 02/08/24   Timmy Maude SAUNDERS, MD  megestrol  (MEGACE ) 400 MG/10ML suspension Take 10 mLs (400 mg total) by mouth daily. Patient not taking: Reported on 02/25/2024 02/09/24   Timmy Maude SAUNDERS, MD  methocarbamol  (ROBAXIN ) 500 MG tablet Take 1 tablet (500 mg  total) by mouth every 6 (six) hours as needed for muscle spasms. Patient not taking: Reported on 02/25/2024 02/24/23   Timmy Maude SAUNDERS, MD  metoCLOPramide  (REGLAN ) 10 MG tablet Take 1 tablet (10 mg total) by mouth every 6 (six) hours. Patient not taking: Reported on 02/25/2024 12/06/23   Mannie Pac T, DO  nystatin  (MYCOSTATIN ) 100000 UNIT/ML suspension Take 5 mLs (500,000 Units total) by mouth 4 (four) times daily. Patient not taking: Reported on 02/25/2024 12/16/22   Franchot Lauraine HERO, NP  OLANZapine  (ZYPREXA ) 10 MG tablet Take 1 tablet (10 mg total) by mouth at bedtime. Patient not taking: Reported on 02/25/2024 02/01/24   Timmy Maude SAUNDERS, MD  ondansetron  (ZOFRAN ) 8 MG tablet Take 1 tablet (8 mg total) by mouth every 8 (eight) hours as needed for nausea or vomiting. Start on the third day after chemotherapy. Patient not taking: Reported on 02/25/2024 11/29/23   Timmy Maude SAUNDERS, MD  ondansetron  (ZOFRAN -ODT) 4 MG disintegrating tablet Take 1 tablet (4 mg total) by mouth every 8 (eight) hours as needed for nausea or vomiting. Patient not taking: Reported on 02/25/2024 12/23/23   Birda Carlin LABOR, PA-C  oxyCODONE  (OXY IR/ROXICODONE ) 5 MG immediate  release tablet Take 1 tablet (5 mg total) by mouth every 4 (four) hours as needed for severe pain (pain score 7-10). 03/17/24   Timmy Maude SAUNDERS, MD  pantoprazole  (PROTONIX ) 40 MG tablet Take 1 tablet (40 mg total) by mouth 2 (two) times daily. Patient not taking: Reported on 02/25/2024 10/04/23   Timmy Maude SAUNDERS, MD  polyethylene glycol (MIRALAX  / GLYCOLAX ) 17 g packet Take 17 g by mouth daily as needed for mild constipation. Patient not taking: Reported on 02/25/2024 11/23/22   Akula, Vijaya, MD  prochlorperazine  (COMPAZINE ) 10 MG tablet Take 1 tablet (10 mg total) by mouth every 6 (six) hours as needed for nausea or vomiting. Patient not taking: Reported on 02/25/2024 11/29/23   Timmy Maude SAUNDERS, MD     Family History  Problem Relation Age of Onset   COPD Mother     Cancer Mother     Social History   Socioeconomic History   Marital status: Single    Spouse name: Not on file   Number of children: 0   Years of education: Not on file   Highest education level: Associate degree: occupational, Scientist, product/process development, or vocational program  Occupational History    Comment: NA   Occupation: Curator  Tobacco Use   Smoking status: Every Day    Current packs/day: 1.00    Average packs/day: 1 pack/day for 26.0 years (26.0 ttl pk-yrs)    Types: Cigarettes    Passive exposure: Current   Smokeless tobacco: Never   Tobacco comments:    Smoking half a pack/day  Vaping Use   Vaping status: Former  Substance and Sexual Activity   Alcohol use: Yes    Alcohol/week: 0.0 standard drinks of alcohol    Comment: occ   Drug use: No   Sexual activity: Not Currently  Other Topics Concern   Not on file  Social History Narrative   Not on file   Social Drivers of Health   Financial Resource Strain: Medium Risk (12/02/2022)   Overall Financial Resource Strain (CARDIA)    Difficulty of Paying Living Expenses: Somewhat hard  Food Insecurity: Patient Declined (12/20/2022)   Hunger Vital Sign    Worried About Running Out of Food in the Last Year: Patient declined    Ran Out of Food in the Last Year: Patient declined  Transportation Needs: No Transportation Needs (12/20/2022)   PRAPARE - Administrator, Civil Service (Medical): No    Lack of Transportation (Non-Medical): No  Physical Activity: Not on file  Stress: Not on file  Social Connections: Not on file     Vital Signs: There were no vitals taken for this visit.  Physical Exam Deferred secondary to virtual viist  Imaging: No results found.    IR ChemoEmbolization, 12/23/23 Images reviewed, demonstrating adequate targeting of large liver tumors  Labs:  CBC: Recent Labs    01/07/24 0901 01/11/24 1148 02/01/24 0806 02/25/24 1038  WBC 5.4 8.4 7.2 6.6  HGB 11.7* 13.7 15.2 15.4  HCT 35.3*  41.2 46.1 47.0  PLT 339 332 126* 122*    COAGS: Recent Labs    12/23/23 1000  INR 0.9    BMP: Recent Labs    01/07/24 0901 01/11/24 1148 02/01/24 0806 02/25/24 1038  NA 139 138 140 142  K 4.7 4.4 4.2 4.1  CL 103 100 102 105  CO2 30 30 30 27   GLUCOSE 110* 181* 116* 159*  BUN 19 16 15  23*  CALCIUM  9.7 10.5* 10.3 9.8  CREATININE 1.10 1.02 1.07 1.14  GFRNONAA >60 >60 >60 >60    LIVER FUNCTION TESTS: Recent Labs    01/07/24 0901 01/11/24 1148 02/01/24 0806 02/25/24 1038  BILITOT 0.2 0.4 0.9 0.9  AST 51* 39 45* 51*  ALT 87* 66* 52* 58*  ALKPHOS 371* 407* 342* 305*  PROT 6.7 7.6 7.9 6.8  ALBUMIN  3.4* 3.9 4.3 4.2   Oncology:   Cancer Staging  Colon cancer metastasized to liver [facility]) Staging form: Colon and Rectum, AJCC 8th Edition - Clinical stage from 12/01/2022: cT4, cN2, pM1 - Signed by Timmy Maude SAUNDERS, MD on 12/01/2022 Stage prefix: Initial diagnosis Histologic grade (G): G2 Histologic grading system: 4 grade system   Assessment and Plan:  48 y/o M w Dx of metastatic colon CA and bilobar hepatic tumor burden.  Pt endorsed baseline RUQ discomfort. ECOG 1.   Treatment Hx; FOLFOXIRI q14 days, s/p 12 cycles completed (4/17 - 08/27/23) single dose of bevacizumab  (Avastin ) (q21 days and began on 09/13/23), held due to Pt discomfort  s/p bilobar DEB TACE  on 12/23/23  *No concern from VIR perspective.  *OK to resume bevacizumab  / Avastin , if recommended by Oncologist *Oncology follow up, with additional treatment plan and imaging  *VIR Clinic follow up PRN  Thank you for allowing me to participate in the care of this Patient.  Electronically Signed:  Thom Hall, MD [facility]   Pager. [phone] Clinic. [phone]  As part of this video-telephonic encounter, no in-person exam was conducted.   The patient was physically located in [place]  or a state in which I am permitted to  provide care. The encounter was reasonable and appropriate under the circumstances given the patient's presentation at the time.  I spent a total of 30 Minutes of non-face-to-face time in clinical consultation, greater than 50% of which was counseling/coordinating care for Richard Bowman [facility] evaluation for treatment of metastatic colon cancer to liver.

## 2024-03-20 NOTE — Addendum Note (Signed)
 Encounter addended by: Hughes Simmonds, MD on: 03/20/2024 9:14 AM  Actions taken: Clinical Note Signed

## 2024-03-23 ENCOUNTER — Other Ambulatory Visit (HOSPITAL_BASED_OUTPATIENT_CLINIC_OR_DEPARTMENT_OTHER): Payer: Self-pay

## 2024-03-24 ENCOUNTER — Inpatient Hospital Stay (HOSPITAL_BASED_OUTPATIENT_CLINIC_OR_DEPARTMENT_OTHER): Admitting: Family

## 2024-03-24 ENCOUNTER — Inpatient Hospital Stay

## 2024-03-24 ENCOUNTER — Inpatient Hospital Stay: Attending: Hematology & Oncology

## 2024-03-24 ENCOUNTER — Other Ambulatory Visit: Payer: Self-pay

## 2024-03-24 ENCOUNTER — Encounter: Payer: Self-pay | Admitting: *Deleted

## 2024-03-24 ENCOUNTER — Encounter: Payer: Self-pay | Admitting: Family

## 2024-03-24 VITALS — BP 110/60 | HR 64 | Temp 98.5°F | Resp 18 | Wt 202.1 lb

## 2024-03-24 DIAGNOSIS — C787 Secondary malignant neoplasm of liver and intrahepatic bile duct: Secondary | ICD-10-CM

## 2024-03-24 DIAGNOSIS — C189 Malignant neoplasm of colon, unspecified: Secondary | ICD-10-CM

## 2024-03-24 LAB — CBC WITH DIFFERENTIAL (CANCER CENTER ONLY)
Abs Immature Granulocytes: 0.01 K/uL (ref 0.00–0.07)
Basophils Absolute: 0 K/uL (ref 0.0–0.1)
Basophils Relative: 0 %
Eosinophils Absolute: 0.2 K/uL (ref 0.0–0.5)
Eosinophils Relative: 4 %
HCT: 44.5 % (ref 39.0–52.0)
Hemoglobin: 14.7 g/dL (ref 13.0–17.0)
Immature Granulocytes: 0 %
Lymphocytes Relative: 28 %
Lymphs Abs: 1.4 K/uL (ref 0.7–4.0)
MCH: 30.2 pg (ref 26.0–34.0)
MCHC: 33 g/dL (ref 30.0–36.0)
MCV: 91.4 fL (ref 80.0–100.0)
Monocytes Absolute: 0.4 K/uL (ref 0.1–1.0)
Monocytes Relative: 8 %
Neutro Abs: 3 K/uL (ref 1.7–7.7)
Neutrophils Relative %: 60 %
Platelet Count: 110 K/uL — ABNORMAL LOW (ref 150–400)
RBC: 4.87 MIL/uL (ref 4.22–5.81)
RDW: 14.5 % (ref 11.5–15.5)
WBC Count: 5 K/uL (ref 4.0–10.5)
nRBC: 0 % (ref 0.0–0.2)

## 2024-03-24 LAB — CMP (CANCER CENTER ONLY)
ALT: 77 U/L — ABNORMAL HIGH (ref 0–44)
AST: 69 U/L — ABNORMAL HIGH (ref 15–41)
Albumin: 4 g/dL (ref 3.5–5.0)
Alkaline Phosphatase: 393 U/L — ABNORMAL HIGH (ref 38–126)
Anion gap: 11 (ref 5–15)
BUN: 17 mg/dL (ref 6–20)
CO2: 25 mmol/L (ref 22–32)
Calcium: 10.1 mg/dL (ref 8.9–10.3)
Chloride: 104 mmol/L (ref 98–111)
Creatinine: 1.01 mg/dL (ref 0.61–1.24)
GFR, Estimated: >60 mL/min (ref >60)
Glucose, Bld: 159 mg/dL — ABNORMAL HIGH (ref 70–99)
Potassium: 4.1 mmol/L (ref 3.5–5.1)
Sodium: 140 mmol/L (ref 135–145)
Total Bilirubin: 0.5 mg/dL (ref 0.0–1.2)
Total Protein: 7 g/dL (ref 6.5–8.1)

## 2024-03-24 LAB — CEA (ACCESS): CEA (CHCC): 9.57 ng/mL — ABNORMAL HIGH (ref 0.00–5.00)

## 2024-03-24 NOTE — Progress Notes (Signed)
 Hematology and Oncology Follow Up Visit  Richard Bowman [id number] [birth year] 48 y.o. 03/24/2024   Principle Diagnosis:  Metastatic adenocarcinoma of the colon-liver metastasis -- pMMR/MSI-low -- (+) KRAS/ ERBB2(+) Iron deficiency anemia Superficial thrombus of the right forearm   Current Therapy:        FOLFOXIRI - s/p cycle #11  - start on  -- 12/09/2022 --oxaliplatin  dropped after 04/19/2023 IV iron as indicated Xeloda /Avastin  -- s/p cycle #1 on 09/13/2023 - Avastin  on hold - Xeloda  d/c'ed on 10/15/2023 Enhertu  - s/p cycle #2 - start on 11/26/2023 Intrahepatic Y-90 -first dose on 12/23/2023 Xarelto  10 mg p.o. daily-started on 12/19/2023   Interim History:  Mr. Deroo is here today for follow-up and treatment. He is doing fairly well. He is still having some abdominal pain across the abdomen since having the Y90 treatment. This seems to be somewhat better.  CEA last month was 22 (previously 13). Today's result is pending.  LFT's remain elevated.  Minimal nausea since treatment.  Appetite is starting to improve. He is doing his best to stay well hydrated. His weight is better at 202 lbs.  No fever, chills, cough, rash, dizziness, chest pain, palpitations or changes in bowel or bladder habits at this time.  He has some mild SOB with exertion and takes a break to rest when needed.  Constipation improves with stool softener which he is taking as needed.  No blood loss noted. No abnormal bruising, no petechiae.  No swelling in his extremities.  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  Pedal pulses are 2+.  No falls or syncope reported.   ECOG Performance Status: 1 - Symptomatic but completely ambulatory  Medications:  Allergies as of 03/24/2024       Reactions   Xeloda  [capecitabine ] Nausea And Vomiting, Other (See Comments)   Chest pain        Medication List        Accurate as of March 24, 2024 11:02 AM. If you have any questions, ask your nurse or doctor.           acetaminophen  500 MG tablet Commonly known as: TYLENOL  Take 2 tablets (1,000 mg total) by mouth every 6 (six) hours as needed for mild pain.   dexamethasone  4 MG tablet Commonly known as: DECADRON  Take 2 tablets (8 mg) by mouth daily for 3 days starting the day after chemotherapy. Take with food.   docusate sodium  100 MG capsule Commonly known as: COLACE Take 1 capsule (100 mg total) by mouth 2 (two) times daily.   dronabinol  5 MG capsule Commonly known as: MARINOL  Take 1 capsule (5 mg total) by mouth 2 (two) times daily before a meal.   gabapentin  300 MG capsule Commonly known as: NEURONTIN  Take 1 capsule (300 mg total) by mouth 3 (three) times daily.   lactose free nutrition Liqd Take 237 mLs by mouth daily as needed (Supplement).   lactulose  10 GM/15ML solution Commonly known as: CHRONULAC  Take 15 mLs (10 g total) by mouth 3 (three) times daily.   lidocaine -prilocaine  cream Commonly known as: EMLA  Apply 1 Application topically as needed.   lidocaine -prilocaine  cream Commonly known as: EMLA  Apply to affected area once   LORazepam  0.5 MG tablet Commonly known as: ATIVAN  Place 1 tablet (0.5 mg total) under the tongue every 6 (six) hours as needed for anxiety.   megestrol  400 MG/10ML suspension Commonly known as: MEGACE  Take 10 mLs (400 mg total) by mouth daily.   megestrol  625 MG/5ML suspension Commonly  known as: MEGACE  ES Take 5 mLs (625 mg total) by mouth daily.   methocarbamol  500 MG tablet Commonly known as: ROBAXIN  Take 1 tablet (500 mg total) by mouth every 6 (six) hours as needed for muscle spasms.   metoCLOPramide  10 MG tablet Commonly known as: REGLAN  Take 1 tablet (10 mg total) by mouth every 6 (six) hours.   nystatin  100000 UNIT/ML suspension Commonly known as: MYCOSTATIN  Take 5 mLs (500,000 Units total) by mouth 4 (four) times daily.   OLANZapine  10 MG tablet Commonly known as: ZYPREXA  Take 1 tablet (10 mg total) by mouth at bedtime.    ondansetron  4 MG disintegrating tablet Commonly known as: ZOFRAN -ODT Take 1 tablet (4 mg total) by mouth every 8 (eight) hours as needed for nausea or vomiting.   ondansetron  8 MG tablet Commonly known as: ZOFRAN  Take 1 tablet (8 mg total) by mouth every 8 (eight) hours as needed for nausea or vomiting. Start on the third day after chemotherapy.   oxyCODONE  5 MG immediate release tablet Commonly known as: Oxy IR/ROXICODONE  Take 1 tablet (5 mg total) by mouth every 4 (four) hours as needed for severe pain (pain score 7-10).   pantoprazole  40 MG tablet Commonly known as: Protonix  Take 1 tablet (40 mg total) by mouth 2 (two) times daily.   polyethylene glycol 17 g packet Commonly known as: MIRALAX  / GLYCOLAX  Take 17 g by mouth daily as needed for mild constipation.   prochlorperazine  10 MG tablet Commonly known as: COMPAZINE  Take 1 tablet (10 mg total) by mouth every 6 (six) hours as needed for nausea or vomiting.        Allergies:  Allergies  Allergen Reactions   Xeloda  [Capecitabine ] Nausea And Vomiting and Other (See Comments)    Chest pain    Past Medical History, Surgical history, Social history, and Family History were reviewed and updated.  Review of Systems: All other 10 point review of systems is negative.   Physical Exam:  vitals were not taken for this visit.   Wt Readings from Last 3 Encounters:  02/25/24 202 lb 1.9 oz (91.7 kg)  02/01/24 199 lb 4 oz (90.4 kg)  01/07/24 209 lb (94.8 kg)    Ocular: Sclerae unicteric, pupils equal, round and reactive to light Ear-nose-throat: Oropharynx clear, dentition fair Lymphatic: No cervical or supraclavicular adenopathy Lungs no rales or rhonchi, good excursion bilaterally Heart regular rate and rhythm, no murmur appreciated Abd soft, nontender, positive bowel sounds MSK no focal spinal tenderness, no joint edema Neuro: non-focal, well-oriented, appropriate affect Breasts: Deferred   Lab Results  Component  Value Date   WBC 6.6 02/25/2024   HGB 15.4 02/25/2024   HCT 47.0 02/25/2024   MCV 90.9 02/25/2024   PLT 122 (L) 02/25/2024   Lab Results  Component Value Date   FERRITIN 825 (H) 02/25/2024   IRON 170 02/25/2024   TIBC 300 02/25/2024   UIBC 130 02/25/2024   IRONPCTSAT 57 (H) 02/25/2024   Lab Results  Component Value Date   RETICCTPCT 1.0 02/25/2024   RBC 5.10 02/25/2024   No results found for: KPAFRELGTCHN, LAMBDASER, KAPLAMBRATIO No results found for: IGGSERUM, IGA, IGMSERUM No results found for: STEPHANY RINGS, A1GS, A2GS, BETS, BETA2SER, GAMS, MSPIKE, SPEI   Chemistry      Component Value Date/Time   NA 142 02/25/2024 1038   K 4.1 02/25/2024 1038   CL 105 02/25/2024 1038   CO2 27 02/25/2024 1038   BUN 23 (H) 02/25/2024 1038   CREATININE  1.14 02/25/2024 1038      Component Value Date/Time   CALCIUM  9.8 02/25/2024 1038   ALKPHOS 305 (H) 02/25/2024 1038   AST 51 (H) 02/25/2024 1038   ALT 58 (H) 02/25/2024 1038   BILITOT 0.9 02/25/2024 1038       Impression and Plan: Mr. Cease is a very nice 48 yo caucasian gentleman with metastatic colon cancer. He had been on previous treatment. He had responded well to treatment but unfortunately, began to progress. He currently is on Enhertu . He has a rare cancer that is HER2 positive. He has been very reluctant to take treatment, however, he has recently been agreeable to treatment.  After discussing the patients symptoms and labs with Dr. Ennever we will hold treatment for now and allow him to recuperate from the Y90 treatment.  We will repeat CT scans in 3 weeks so results are available for MD to review with patient at follow-up. Patient has number to call and schedule.  Follow-up with MD in 4 weeks.   Lauraine Pepper, NP 7/25/202511:02 AM

## 2024-03-24 NOTE — Progress Notes (Signed)
 Patient will hold treatment today due to continued side effects. He will need CT's prior to his follow up appointment.   Oncology Nurse Navigator Documentation     03/24/2024    1:00 PM  Oncology Nurse Navigator Flowsheets  Navigator Follow Up Date: 04/14/2024  Navigator Follow Up Reason: Scan Review  Navigator Location [facility]  Navigator Encounter Type Follow-up Appt  Patient Visit Type MedOnc  Treatment Phase Active Tx  Barriers/Navigation Needs Coordination of Care  Interventions None Required  Acuity Level 1-No Barriers  Time Spent with Patient 15

## 2024-04-04 ENCOUNTER — Ambulatory Visit: Admitting: Hematology & Oncology

## 2024-04-04 ENCOUNTER — Inpatient Hospital Stay

## 2024-04-04 ENCOUNTER — Encounter: Payer: Self-pay | Admitting: Hematology & Oncology

## 2024-04-04 ENCOUNTER — Ambulatory Visit

## 2024-04-05 ENCOUNTER — Other Ambulatory Visit (HOSPITAL_BASED_OUTPATIENT_CLINIC_OR_DEPARTMENT_OTHER): Payer: Self-pay

## 2024-04-10 ENCOUNTER — Other Ambulatory Visit: Payer: Self-pay | Admitting: *Deleted

## 2024-04-10 ENCOUNTER — Other Ambulatory Visit (HOSPITAL_BASED_OUTPATIENT_CLINIC_OR_DEPARTMENT_OTHER): Payer: Self-pay

## 2024-04-10 DIAGNOSIS — C189 Malignant neoplasm of colon, unspecified: Secondary | ICD-10-CM

## 2024-04-10 DIAGNOSIS — C787 Secondary malignant neoplasm of liver and intrahepatic bile duct: Secondary | ICD-10-CM

## 2024-04-10 MED ORDER — OXYCODONE HCL 5 MG PO TABS
5.0000 mg | ORAL_TABLET | ORAL | 0 refills | Status: DC | PRN
  Filled 2024-04-10: qty 60, 10d supply, fill #0

## 2024-04-13 ENCOUNTER — Other Ambulatory Visit (HOSPITAL_BASED_OUTPATIENT_CLINIC_OR_DEPARTMENT_OTHER): Payer: Self-pay

## 2024-04-14 ENCOUNTER — Ambulatory Visit (HOSPITAL_BASED_OUTPATIENT_CLINIC_OR_DEPARTMENT_OTHER)
Admission: RE | Admit: 2024-04-14 | Discharge: 2024-04-14 | Disposition: A | Discharge status: Home / Self Care | Source: Ambulatory Visit | Attending: Family | Admitting: Family

## 2024-04-14 DIAGNOSIS — C787 Secondary malignant neoplasm of liver and intrahepatic bile duct: Secondary | ICD-10-CM | POA: Diagnosis present

## 2024-04-14 DIAGNOSIS — C189 Malignant neoplasm of colon, unspecified: Secondary | ICD-10-CM | POA: Insufficient documentation

## 2024-04-14 MED ORDER — BARIUM SULFATE 2 % PO SUSP
450.0000 mL | Freq: Once | ORAL | Status: AC
  Administered 2024-04-14: 450 mL via ORAL

## 2024-04-14 MED ORDER — IOHEXOL 300 MG/ML  SOLN
100.0000 mL | Freq: Once | INTRAMUSCULAR | Status: AC | PRN
Start: 2024-04-14 — End: 2024-04-14
  Administered 2024-04-14: 100 mL via INTRAVENOUS

## 2024-04-24 ENCOUNTER — Other Ambulatory Visit: Payer: Self-pay | Admitting: *Deleted

## 2024-04-24 DIAGNOSIS — G629 Polyneuropathy, unspecified: Secondary | ICD-10-CM

## 2024-04-24 DIAGNOSIS — R7309 Other abnormal glucose: Secondary | ICD-10-CM

## 2024-04-24 DIAGNOSIS — B370 Candidal stomatitis: Secondary | ICD-10-CM

## 2024-04-24 DIAGNOSIS — C787 Secondary malignant neoplasm of liver and intrahepatic bile duct: Secondary | ICD-10-CM

## 2024-04-24 DIAGNOSIS — R7401 Elevation of levels of liver transaminase levels: Secondary | ICD-10-CM

## 2024-04-24 DIAGNOSIS — K625 Hemorrhage of anus and rectum: Secondary | ICD-10-CM

## 2024-04-24 DIAGNOSIS — H9311 Tinnitus, right ear: Secondary | ICD-10-CM

## 2024-04-24 DIAGNOSIS — R935 Abnormal findings on diagnostic imaging of other abdominal regions, including retroperitoneum: Secondary | ICD-10-CM

## 2024-04-24 DIAGNOSIS — K6389 Other specified diseases of intestine: Secondary | ICD-10-CM

## 2024-04-24 DIAGNOSIS — M62838 Other muscle spasm: Secondary | ICD-10-CM

## 2024-04-24 DIAGNOSIS — G893 Neoplasm related pain (acute) (chronic): Secondary | ICD-10-CM

## 2024-04-24 DIAGNOSIS — R1084 Generalized abdominal pain: Secondary | ICD-10-CM

## 2024-04-24 DIAGNOSIS — E875 Hyperkalemia: Secondary | ICD-10-CM

## 2024-04-24 DIAGNOSIS — R569 Unspecified convulsions: Secondary | ICD-10-CM

## 2024-04-24 DIAGNOSIS — Z95828 Presence of other vascular implants and grafts: Secondary | ICD-10-CM

## 2024-04-24 DIAGNOSIS — R52 Pain, unspecified: Secondary | ICD-10-CM

## 2024-04-24 DIAGNOSIS — Z1379 Encounter for other screening for genetic and chromosomal anomalies: Secondary | ICD-10-CM

## 2024-04-24 DIAGNOSIS — F17200 Nicotine dependence, unspecified, uncomplicated: Secondary | ICD-10-CM

## 2024-04-24 DIAGNOSIS — M79602 Pain in left arm: Secondary | ICD-10-CM

## 2024-04-24 DIAGNOSIS — C189 Malignant neoplasm of colon, unspecified: Secondary | ICD-10-CM

## 2024-04-25 ENCOUNTER — Other Ambulatory Visit: Payer: Self-pay

## 2024-04-25 ENCOUNTER — Telehealth: Payer: Self-pay | Admitting: Pharmacist

## 2024-04-25 ENCOUNTER — Encounter: Payer: Self-pay | Admitting: Hematology & Oncology

## 2024-04-25 ENCOUNTER — Inpatient Hospital Stay (HOSPITAL_BASED_OUTPATIENT_CLINIC_OR_DEPARTMENT_OTHER): Admitting: Hematology & Oncology

## 2024-04-25 ENCOUNTER — Other Ambulatory Visit (HOSPITAL_BASED_OUTPATIENT_CLINIC_OR_DEPARTMENT_OTHER): Payer: Self-pay

## 2024-04-25 ENCOUNTER — Encounter: Payer: Self-pay | Admitting: *Deleted

## 2024-04-25 ENCOUNTER — Inpatient Hospital Stay

## 2024-04-25 ENCOUNTER — Inpatient Hospital Stay: Attending: Hematology & Oncology

## 2024-04-25 ENCOUNTER — Other Ambulatory Visit (HOSPITAL_COMMUNITY): Payer: Self-pay

## 2024-04-25 VITALS — BP 126/74 | HR 62 | Temp 98.4°F | Resp 18 | Ht 70.0 in | Wt 200.0 lb

## 2024-04-25 DIAGNOSIS — R935 Abnormal findings on diagnostic imaging of other abdominal regions, including retroperitoneum: Secondary | ICD-10-CM

## 2024-04-25 DIAGNOSIS — C189 Malignant neoplasm of colon, unspecified: Secondary | ICD-10-CM

## 2024-04-25 DIAGNOSIS — C787 Secondary malignant neoplasm of liver and intrahepatic bile duct: Secondary | ICD-10-CM | POA: Diagnosis present

## 2024-04-25 DIAGNOSIS — D509 Iron deficiency anemia, unspecified: Secondary | ICD-10-CM | POA: Insufficient documentation

## 2024-04-25 DIAGNOSIS — R1084 Generalized abdominal pain: Secondary | ICD-10-CM

## 2024-04-25 DIAGNOSIS — H9311 Tinnitus, right ear: Secondary | ICD-10-CM

## 2024-04-25 DIAGNOSIS — Z95828 Presence of other vascular implants and grafts: Secondary | ICD-10-CM

## 2024-04-25 DIAGNOSIS — G629 Polyneuropathy, unspecified: Secondary | ICD-10-CM

## 2024-04-25 DIAGNOSIS — B370 Candidal stomatitis: Secondary | ICD-10-CM

## 2024-04-25 DIAGNOSIS — M62838 Other muscle spasm: Secondary | ICD-10-CM

## 2024-04-25 DIAGNOSIS — R569 Unspecified convulsions: Secondary | ICD-10-CM

## 2024-04-25 DIAGNOSIS — Z1379 Encounter for other screening for genetic and chromosomal anomalies: Secondary | ICD-10-CM

## 2024-04-25 DIAGNOSIS — R52 Pain, unspecified: Secondary | ICD-10-CM

## 2024-04-25 DIAGNOSIS — K6389 Other specified diseases of intestine: Secondary | ICD-10-CM

## 2024-04-25 DIAGNOSIS — K625 Hemorrhage of anus and rectum: Secondary | ICD-10-CM

## 2024-04-25 DIAGNOSIS — R7309 Other abnormal glucose: Secondary | ICD-10-CM

## 2024-04-25 DIAGNOSIS — G893 Neoplasm related pain (acute) (chronic): Secondary | ICD-10-CM

## 2024-04-25 DIAGNOSIS — E875 Hyperkalemia: Secondary | ICD-10-CM

## 2024-04-25 DIAGNOSIS — C7800 Secondary malignant neoplasm of unspecified lung: Secondary | ICD-10-CM | POA: Insufficient documentation

## 2024-04-25 DIAGNOSIS — R7401 Elevation of levels of liver transaminase levels: Secondary | ICD-10-CM

## 2024-04-25 DIAGNOSIS — F17200 Nicotine dependence, unspecified, uncomplicated: Secondary | ICD-10-CM

## 2024-04-25 DIAGNOSIS — M79602 Pain in left arm: Secondary | ICD-10-CM

## 2024-04-25 LAB — RETICULOCYTES
Immature Retic Fract: 3.9 % (ref 2.3–15.9)
RBC.: 5.22 MIL/uL (ref 4.22–5.81)
Retic Count, Absolute: 37.1 K/uL (ref 19.0–186.0)
Retic Ct Pct: 0.7 % (ref 0.4–3.1)

## 2024-04-25 LAB — CMP (CANCER CENTER ONLY)
ALT: 87 U/L — ABNORMAL HIGH (ref 0–44)
AST: 65 U/L — ABNORMAL HIGH (ref 15–41)
Albumin: 4.5 g/dL (ref 3.5–5.0)
Alkaline Phosphatase: 358 U/L — ABNORMAL HIGH (ref 38–126)
Anion gap: 12 (ref 5–15)
BUN: 14 mg/dL (ref 6–20)
CO2: 26 mmol/L (ref 22–32)
Calcium: 10.2 mg/dL (ref 8.9–10.3)
Chloride: 103 mmol/L (ref 98–111)
Creatinine: 1.03 mg/dL (ref 0.61–1.24)
GFR, Estimated: >60 mL/min (ref >60)
Glucose, Bld: 163 mg/dL — ABNORMAL HIGH (ref 70–99)
Potassium: 4.2 mmol/L (ref 3.5–5.1)
Sodium: 141 mmol/L (ref 135–145)
Total Bilirubin: 0.6 mg/dL (ref 0.0–1.2)
Total Protein: 7.6 g/dL (ref 6.5–8.1)

## 2024-04-25 LAB — IRON AND IRON BINDING CAPACITY (CC-WL,HP ONLY)
Iron: 85 ug/dL (ref 45–182)
Saturation Ratios: 24 % (ref 17.9–39.5)
TIBC: 350 ug/dL (ref 250–450)
UIBC: 265 ug/dL

## 2024-04-25 LAB — CBC WITH DIFFERENTIAL (CANCER CENTER ONLY)
Abs Immature Granulocytes: 0.02 K/uL (ref 0.00–0.07)
Basophils Absolute: 0 K/uL (ref 0.0–0.1)
Basophils Relative: 0 %
Eosinophils Absolute: 0.2 K/uL (ref 0.0–0.5)
Eosinophils Relative: 4 %
HCT: 47.9 % (ref 39.0–52.0)
Hemoglobin: 16.1 g/dL (ref 13.0–17.0)
Immature Granulocytes: 0 %
Lymphocytes Relative: 21 %
Lymphs Abs: 1.4 K/uL (ref 0.7–4.0)
MCH: 30.3 pg (ref 26.0–34.0)
MCHC: 33.6 g/dL (ref 30.0–36.0)
MCV: 90.2 fL (ref 80.0–100.0)
Monocytes Absolute: 0.4 K/uL (ref 0.1–1.0)
Monocytes Relative: 5 %
Neutro Abs: 4.7 K/uL (ref 1.7–7.7)
Neutrophils Relative %: 70 %
Platelet Count: 111 K/uL — ABNORMAL LOW (ref 150–400)
RBC: 5.31 MIL/uL (ref 4.22–5.81)
RDW: 13.7 % (ref 11.5–15.5)
WBC Count: 6.8 K/uL (ref 4.0–10.5)
nRBC: 0 % (ref 0.0–0.2)

## 2024-04-25 LAB — FERRITIN: Ferritin: 553 ng/mL — ABNORMAL HIGH (ref 24–336)

## 2024-04-25 LAB — CEA (ACCESS): CEA (CHCC): 33.49 ng/mL — ABNORMAL HIGH (ref 0.00–5.00)

## 2024-04-25 MED ORDER — OXYCODONE HCL 5 MG PO TABS
5.0000 mg | ORAL_TABLET | ORAL | 0 refills | Status: DC | PRN
  Filled 2024-04-25: qty 60, 10d supply, fill #0

## 2024-04-25 MED ORDER — LONSURF 20-8.19 MG PO TABS
35.0000 mg/m2 | ORAL_TABLET | Freq: Two times a day (BID) | ORAL | 4 refills | Status: DC
  Filled 2024-04-26: qty 80, 28d supply, fill #0
  Filled 2024-05-17: qty 80, 28d supply, fill #1
  Filled 2024-06-26 – 2024-07-10 (×3): qty 80, 28d supply, fill #2
  Filled 2024-08-02: qty 80, 28d supply, fill #3

## 2024-04-25 MED ORDER — DRONABINOL 5 MG PO CAPS
5.0000 mg | ORAL_CAPSULE | Freq: Two times a day (BID) | ORAL | 0 refills | Status: DC
  Filled 2024-04-25: qty 60, 30d supply, fill #0

## 2024-04-25 NOTE — Progress Notes (Signed)
 Hematology and Oncology Follow Up Visit  Richard Bowman [id number] 02-Dec-1975 48 y.o. 04/25/2024   Principle Diagnosis:  Metastatic adenocarcinoma of the colon-liver metastasis -- pMMR/MSI-low -- (+) KRAS/ ERBB2(+) Iron deficiency anemia Superficial thrombus of the right forearm   Current Therapy:        FOLFOXIRI - s/p cycle #11  - start on  -- 12/09/2022 --oxaliplatin  dropped after 04/19/2023 IV iron as indicated Xeloda /Avastin  -- s/p cycle #1 on 09/13/2023 - Avastin  on hold - Xeloda  d/c'ed on 10/15/2023 Enhertu  - s/p cycle #2 - start on 11/26/2023 -DC on 04/25/2024 Intrahepatic Y-90 -first dose on 12/23/2023 Xarelto  10 mg p.o. daily-started on 12/19/2023 Lonsurf  80 mg po BID (d1-5;d 8-12) -start on 05/02/2024   Interim History:  Mr. Siemon is here today for follow-up.  Unfortunately, it looks like we do have progressive disease.  He did have a CT scan of the chest done on 04/14/2024.  The CT scan did show that he did have progressive lung metastasis.  I think part of the problem is that he just does not be able to get the Enhertu  on schedule.  He has had several weeks in which treatment has been delayed.  At this point in time, our options really are not all that great.  I think that the next option for this would be Lonsurf  with Avastin .  I think this would be a very reasonable way to try to help him.  We have had a long talk with him.  I explained that the CT scan did show that his pulmonary disease was progressing.  I think we have to make a change.  Of note, his last CEA was 10.  This actually was better.  However, I just do not think he has done well with the Enhertu .  I think his had toxicity from the Enhertu .  I think that Lonsurf  would not be a bad idea for him.  I explained to him about Lonsurf .  I explained that there are pills.  I explained that there is certainly side effects with the Lonsurf .  He would lose his hair.  He may have diarrhea.  He may have lower blood  counts.  I will see about getting Lonsurf  set up for him.  I will send a prescription to our [facility].  Currently, I would have to say that his performance status probably ECOG 1.   Medications:  Allergies as of 04/25/2024       Reactions   Xeloda  [capecitabine ] Nausea And Vomiting, Other (See Comments)   Chest pain        Medication List        Accurate as of April 25, 2024 11:14 AM. If you have any questions, ask your nurse or doctor.          acetaminophen  500 MG tablet Commonly known as: TYLENOL  Take 2 tablets (1,000 mg total) by mouth every 6 (six) hours as needed for mild pain.   dexamethasone  4 MG tablet Commonly known as: DECADRON  Take 2 tablets (8 mg) by mouth daily for 3 days starting the day after chemotherapy. Take with food.   docusate sodium  100 MG capsule Commonly known as: COLACE Take 1 capsule (100 mg total) by mouth 2 (two) times daily.   dronabinol  5 MG capsule Commonly known as: MARINOL  Take 1 capsule (5 mg total) by mouth 2 (two) times daily before a meal.   gabapentin  300 MG capsule Commonly known as: NEURONTIN  Take 1 capsule (300 mg total)  by mouth 3 (three) times daily.   lactose free nutrition Liqd Take 237 mLs by mouth daily as needed (Supplement).   lactulose  10 GM/15ML solution Commonly known as: CHRONULAC  Take 15 mLs (10 g total) by mouth 3 (three) times daily.   lidocaine -prilocaine  cream Commonly known as: EMLA  Apply 1 Application topically as needed.   lidocaine -prilocaine  cream Commonly known as: EMLA  Apply to affected area once   LORazepam  0.5 MG tablet Commonly known as: ATIVAN  Place 1 tablet (0.5 mg total) under the tongue every 6 (six) hours as needed for anxiety.   megestrol  400 MG/10ML suspension Commonly known as: MEGACE  Take 10 mLs (400 mg total) by mouth daily.   megestrol  625 MG/5ML suspension Commonly known as: MEGACE  ES Take 5 mLs (625 mg total) by mouth daily.   methocarbamol  500 MG  tablet Commonly known as: ROBAXIN  Take 1 tablet (500 mg total) by mouth every 6 (six) hours as needed for muscle spasms.   metoCLOPramide  10 MG tablet Commonly known as: REGLAN  Take 1 tablet (10 mg total) by mouth every 6 (six) hours.   nystatin  100000 UNIT/ML suspension Commonly known as: MYCOSTATIN  Take 5 mLs (500,000 Units total) by mouth 4 (four) times daily.   OLANZapine  10 MG tablet Commonly known as: ZYPREXA  Take 1 tablet (10 mg total) by mouth at bedtime.   ondansetron  4 MG disintegrating tablet Commonly known as: ZOFRAN -ODT Take 1 tablet (4 mg total) by mouth every 8 (eight) hours as needed for nausea or vomiting.   ondansetron  8 MG tablet Commonly known as: ZOFRAN  Take 1 tablet (8 mg total) by mouth every 8 (eight) hours as needed for nausea or vomiting. Start on the third day after chemotherapy.   oxyCODONE  5 MG immediate release tablet Commonly known as: Oxy IR/ROXICODONE  Take 1 tablet (5 mg total) by mouth every 4 (four) hours as needed for severe pain (pain score 7-10).   pantoprazole  40 MG tablet Commonly known as: Protonix  Take 1 tablet (40 mg total) by mouth 2 (two) times daily.   polyethylene glycol 17 g packet Commonly known as: MIRALAX  / GLYCOLAX  Take 17 g by mouth daily as needed for mild constipation.   prochlorperazine  10 MG tablet Commonly known as: COMPAZINE  Take 1 tablet (10 mg total) by mouth every 6 (six) hours as needed for nausea or vomiting.        Allergies:  Allergies  Allergen Reactions   Xeloda  [Capecitabine ] Nausea And Vomiting and Other (See Comments)    Chest pain    Past Medical History, Surgical history, Social history, and Family History were reviewed and updated.  Review of Systems:  Review of Systems  Constitutional: Negative.   HENT: Negative.    Eyes: Negative.   Respiratory: Negative.    Cardiovascular: Negative.   Gastrointestinal: Negative.   Genitourinary: Negative.   Musculoskeletal: Negative.   Skin:  Negative.   Neurological: Negative.   Endo/Heme/Allergies: Negative.   Psychiatric/Behavioral: Negative.       Physical Exam:  height is 5' 10 (1.778 m) and weight is 200 lb (90.7 kg). His oral temperature is 98.4 F (36.9 C). His blood pressure is 126/74 and his pulse is 62. His respiration is 18 and oxygen saturation is 95%.   Wt Readings from Last 3 Encounters:  04/25/24 200 lb (90.7 kg)  03/24/24 202 lb 1.9 oz (91.7 kg)  02/25/24 202 lb 1.9 oz (91.7 kg)   Physical Exam Vitals reviewed.  HENT:     Head: Normocephalic and atraumatic.  Eyes:  Pupils: Pupils are equal, round, and reactive to light.  Cardiovascular:     Rate and Rhythm: Normal rate and regular rhythm.     Heart sounds: Normal heart sounds.  Pulmonary:     Effort: Pulmonary effort is normal.     Breath sounds: Normal breath sounds.  Abdominal:     General: Bowel sounds are normal.     Palpations: Abdomen is soft.  Musculoskeletal:        General: No tenderness or deformity. Normal range of motion.     Cervical back: Normal range of motion.  Lymphadenopathy:     Cervical: No cervical adenopathy.  Skin:    General: Skin is warm and dry.     Findings: No erythema or rash.  Neurological:     Mental Status: He is alert and oriented to person, place, and time.  Psychiatric:        Behavior: Behavior normal.        Thought Content: Thought content normal.        Judgment: Judgment normal.      Lab Results  Component Value Date   WBC 6.8 04/25/2024   HGB 16.1 04/25/2024   HCT 47.9 04/25/2024   MCV 90.2 04/25/2024   PLT 111 (L) 04/25/2024   Lab Results  Component Value Date   FERRITIN 825 (H) 02/25/2024   IRON 170 02/25/2024   TIBC 300 02/25/2024   UIBC 130 02/25/2024   IRONPCTSAT 57 (H) 02/25/2024   Lab Results  Component Value Date   RETICCTPCT 0.7 04/25/2024   RBC 5.22 04/25/2024   RBC 5.31 04/25/2024   No results found for: KPAFRELGTCHN, LAMBDASER, KAPLAMBRATIO No results  found for: IGGSERUM, IGA, IGMSERUM No results found for: STEPHANY CARLOTA BENSON MARKEL EARLA JOANNIE DOC VICK, SPEI   Chemistry      Component Value Date/Time   NA 141 04/25/2024 1015   K 4.2 04/25/2024 1015   CL 103 04/25/2024 1015   CO2 26 04/25/2024 1015   BUN 14 04/25/2024 1015   CREATININE 1.03 04/25/2024 1015      Component Value Date/Time   CALCIUM  10.2 04/25/2024 1015   ALKPHOS 358 (H) 04/25/2024 1015   AST 65 (H) 04/25/2024 1015   ALT 87 (H) 04/25/2024 1015   BILITOT 0.6 04/25/2024 1015       Impression and Plan: Mr. Maclellan is a very nice 48 yo caucasian gentleman with metastatic colon cancer.  Unfortunately, he does look like he is progressing.  As such, we will make a change in his protocol.  I will go ahead and plan on the Lonsurf .  Hopefully, he will be able to get Lonsurf  and be able to tolerate the Lonsurf .  We will see about getting Lonsurf  started next week.  We have I do think he would also benefit from Avastin .  We will have to see about give him set up for Avastin .  We can certainly send him to one of the academic centers for consideration for protocol.  Lets see how he does with the Lonsurf  initially.  I will plan to get him back to see us  probably in about 3 weeks.  Will get him on the Avastin  when we see him back.    Maude JONELLE Crease, MD 8/26/202511:14 AM

## 2024-04-25 NOTE — Progress Notes (Unsigned)
 Reviewed CT which shows disease progression. His regimen will now be changed to Lonsurf /Avastin . Lonsurf  sent to specialty pharmacy. Will start with Avastin  in a few weeks.   Oncology Nurse Navigator Documentation     04/25/2024   12:00 PM  Oncology Nurse Navigator Flowsheets  Navigator Follow Up Date: 05/10/2024  Navigator Follow Up Reason: Follow-up Appointment;Chemotherapy  Navigator Location [facility]  Navigator Encounter Type Scan Review;Follow-up Appt  Patient Visit Type MedOnc  Treatment Phase Active Tx  Barriers/Navigation Needs Coordination of Care  Interventions None Required  Acuity Level 1-No Barriers  Time Spent with Patient 15

## 2024-04-25 NOTE — Telephone Encounter (Signed)
 Oral Oncology Pharmacist Encounter  Received new prescription for Lonsurf  (trifluridine -tipiracil ) for the treatment of metastatic colon cancer, in conjunction with bevacizumab , planned duration until disease progression or unacceptable drug toxicity.  CBC w/ Diff and CMP from 04/25/24 assessed, no baseline dose adjustments required at this time for Lonsurf . Prescription dose and frequency assessed for appropriateness.  Current medication list in Epic reviewed, no relevant/significant DDIs with Lonsurf  identified.  Evaluated chart and no patient barriers to medication adherence noted.   Prescription has been e-scribed to the [facility] for benefits analysis and approval.  Oral Oncology Clinic will continue to follow for insurance authorization, copayment issues, initial counseling and start date.  Asberry Macintosh, PharmD, BCPS, BCOP Hematology/Oncology Clinical Pharmacist [phone] 04/25/2024 2:27 PM

## 2024-04-26 ENCOUNTER — Other Ambulatory Visit (HOSPITAL_COMMUNITY): Payer: Self-pay

## 2024-04-26 ENCOUNTER — Telehealth: Payer: Self-pay

## 2024-04-26 ENCOUNTER — Other Ambulatory Visit: Payer: Self-pay

## 2024-04-26 ENCOUNTER — Encounter: Payer: Self-pay | Admitting: Hematology & Oncology

## 2024-04-26 NOTE — Telephone Encounter (Signed)
 Oral Oncology Patient Advocate Encounter  After completing a benefits investigation, prior authorization for Lonsurf  is not required at this time through [facility] NCM .   Cost exceeds maximum override obtained  Patient's copay is $4.00.      Charlott Hamilton,  CPhT-Adv  she/her/hers [facility]  [facility] Pharmacy Technician Patient Advocate Specialist III [facility] Phone: [phone]  Fax: [phone] [email or web address]

## 2024-04-26 NOTE — Progress Notes (Signed)
 Oral Chemotherapy Pharmacist Encounter  Patient was counseled under telephone encounter from 04/25/24.  Asberry Macintosh, PharmD, BCPS, BCOP Hematology/Oncology Clinical Pharmacist [facility] Oral Chemotherapy Navigation Clinics [phone] 04/26/2024 9:52 AM

## 2024-04-26 NOTE — Progress Notes (Signed)
 Specialty Pharmacy Initial Fill Coordination Note  Richard Bowman is a 48 y.o. male contacted today regarding refills of specialty medication(s) Trifluridine -Tipiracil  (Lonsurf ) .  Patient requested [name] at [facility] [place]  on 04/27/24   Medication will be filled on 04/26/24.   Patient is aware of $4.00 copayment.

## 2024-04-26 NOTE — Telephone Encounter (Signed)
 Oral Chemotherapy Pharmacist Encounter    I spoke with patient for overview of: Lonsurf  (trifluridine /tipiracil ) for the treatment of metastatic colon cancer, in conjunction with bevacizumab , planned duration until disease progression or unacceptable drug toxicity.   Treatment goal: Palliative   Counseled patient on administration, dosing, side effects, monitoring, drug-food interactions, safe handling, storage, and disposal.   Patient will take Lonsurf  20mg  (trifluridine  component) tablets, 4 tablets (80mg  trifluridine ) by mouth twice daily, within 1 hour of finishing AM & PM meals, on days 1-5 and days 8-12, every 28 days.  Patient plans to take Lonsurf  M-F, Saturday and Sunday off days, Lonsurf  M-F again, then no tablets for the next 2 weeks. Patient will start D1 of each cycle on Mondays.  Lonsurf  start date: 05/01/24   Adverse effects include but are not limited to: fatigue, nausea, vomiting, diarrhea, and decreased blood counts.    Patient has anti-emetic on hand and knows to take it if nausea develops.   Patient will obtain anti diarrheal and alert the office of 4 or more loose stools above baseline.  Patient updated about CBC check on Cycle 1 Day 14.   Reviewed with patient importance of keeping a medication schedule and plan for any missed doses. No barriers to medication adherence identified.   Medication reconciliation performed and medication/allergy list updated.  Distress thermometer flowsheet: Distress thermometer not completed during telephone call as patient has been on previous lines of therapy.   Communication and Learning Assessment Primary learner: Patient Barriers to learning: No barriers Preferred language: English Learning preferences: Listening Reading  All questions answered.  Mr. Lobello voiced understanding and appreciation.    Medication education handout and medication calendar placed in mail for patient. Patient knows to call the office with questions  or concerns. Oral Chemotherapy Clinic phone number provided to patient.    Asberry Macintosh, PharmD, BCPS, BCOP Hematology/Oncology Clinical Pharmacist [phone] 04/26/2024 9:41 AM

## 2024-04-27 ENCOUNTER — Encounter: Payer: Self-pay | Admitting: Hematology & Oncology

## 2024-04-27 ENCOUNTER — Other Ambulatory Visit (HOSPITAL_BASED_OUTPATIENT_CLINIC_OR_DEPARTMENT_OTHER): Payer: Self-pay

## 2024-04-27 ENCOUNTER — Other Ambulatory Visit: Payer: Self-pay

## 2024-04-28 ENCOUNTER — Other Ambulatory Visit (HOSPITAL_COMMUNITY): Payer: Self-pay

## 2024-04-28 ENCOUNTER — Other Ambulatory Visit: Payer: Self-pay | Admitting: Hematology & Oncology

## 2024-05-10 ENCOUNTER — Other Ambulatory Visit (HOSPITAL_BASED_OUTPATIENT_CLINIC_OR_DEPARTMENT_OTHER): Payer: Self-pay

## 2024-05-10 ENCOUNTER — Encounter: Payer: Self-pay | Admitting: Medical Oncology

## 2024-05-10 ENCOUNTER — Inpatient Hospital Stay: Attending: Hematology & Oncology

## 2024-05-10 ENCOUNTER — Other Ambulatory Visit: Payer: Self-pay | Admitting: Hematology & Oncology

## 2024-05-10 ENCOUNTER — Encounter: Payer: Self-pay | Admitting: *Deleted

## 2024-05-10 ENCOUNTER — Inpatient Hospital Stay (HOSPITAL_BASED_OUTPATIENT_CLINIC_OR_DEPARTMENT_OTHER): Admitting: Medical Oncology

## 2024-05-10 ENCOUNTER — Inpatient Hospital Stay

## 2024-05-10 VITALS — BP 122/78 | HR 75 | Temp 98.1°F | Resp 18 | Wt 200.8 lb

## 2024-05-10 VITALS — BP 128/81 | HR 80

## 2024-05-10 DIAGNOSIS — D509 Iron deficiency anemia, unspecified: Secondary | ICD-10-CM | POA: Insufficient documentation

## 2024-05-10 DIAGNOSIS — E875 Hyperkalemia: Secondary | ICD-10-CM

## 2024-05-10 DIAGNOSIS — C189 Malignant neoplasm of colon, unspecified: Secondary | ICD-10-CM | POA: Insufficient documentation

## 2024-05-10 DIAGNOSIS — Z79899 Other long term (current) drug therapy: Secondary | ICD-10-CM | POA: Insufficient documentation

## 2024-05-10 DIAGNOSIS — G629 Polyneuropathy, unspecified: Secondary | ICD-10-CM | POA: Diagnosis not present

## 2024-05-10 DIAGNOSIS — C787 Secondary malignant neoplasm of liver and intrahepatic bile duct: Secondary | ICD-10-CM

## 2024-05-10 DIAGNOSIS — Z5112 Encounter for antineoplastic immunotherapy: Secondary | ICD-10-CM | POA: Diagnosis present

## 2024-05-10 DIAGNOSIS — C7800 Secondary malignant neoplasm of unspecified lung: Secondary | ICD-10-CM | POA: Insufficient documentation

## 2024-05-10 LAB — CBC WITH DIFFERENTIAL (CANCER CENTER ONLY)
Abs Immature Granulocytes: 0.01 K/uL (ref 0.00–0.07)
Basophils Absolute: 0 K/uL (ref 0.0–0.1)
Basophils Relative: 1 %
Eosinophils Absolute: 0.1 K/uL (ref 0.0–0.5)
Eosinophils Relative: 2 %
HCT: 43.8 % (ref 39.0–52.0)
Hemoglobin: 14.9 g/dL (ref 13.0–17.0)
Immature Granulocytes: 0 %
Lymphocytes Relative: 25 %
Lymphs Abs: 1 K/uL (ref 0.7–4.0)
MCH: 29.9 pg (ref 26.0–34.0)
MCHC: 34 g/dL (ref 30.0–36.0)
MCV: 87.8 fL (ref 80.0–100.0)
Monocytes Absolute: 0.2 K/uL (ref 0.1–1.0)
Monocytes Relative: 5 %
Neutro Abs: 2.7 K/uL (ref 1.7–7.7)
Neutrophils Relative %: 67 %
Platelet Count: 101 K/uL — ABNORMAL LOW (ref 150–400)
RBC: 4.99 MIL/uL (ref 4.22–5.81)
RDW: 13.4 % (ref 11.5–15.5)
WBC Count: 4 K/uL (ref 4.0–10.5)
nRBC: 0 % (ref 0.0–0.2)

## 2024-05-10 LAB — CMP (CANCER CENTER ONLY)
ALT: 95 U/L — ABNORMAL HIGH (ref 0–44)
AST: 79 U/L — ABNORMAL HIGH (ref 15–41)
Albumin: 4.1 g/dL (ref 3.5–5.0)
Alkaline Phosphatase: 364 U/L — ABNORMAL HIGH (ref 38–126)
Anion gap: 12 (ref 5–15)
BUN: 20 mg/dL (ref 6–20)
CO2: 25 mmol/L (ref 22–32)
Calcium: 9.9 mg/dL (ref 8.9–10.3)
Chloride: 102 mmol/L (ref 98–111)
Creatinine: 0.97 mg/dL (ref 0.61–1.24)
GFR, Estimated: >60 mL/min (ref >60)
Glucose, Bld: 149 mg/dL — ABNORMAL HIGH (ref 70–99)
Potassium: 3.7 mmol/L (ref 3.5–5.1)
Sodium: 139 mmol/L (ref 135–145)
Total Bilirubin: 0.9 mg/dL (ref 0.0–1.2)
Total Protein: 7.1 g/dL (ref 6.5–8.1)

## 2024-05-10 LAB — IRON AND IRON BINDING CAPACITY (CC-WL,HP ONLY)
Iron: 136 ug/dL (ref 45–182)
Saturation Ratios: 44 % — ABNORMAL HIGH (ref 17.9–39.5)
TIBC: 308 ug/dL (ref 250–450)
UIBC: 172 ug/dL

## 2024-05-10 LAB — TOTAL PROTEIN, URINE DIPSTICK: Protein, ur: NEGATIVE mg/dL

## 2024-05-10 LAB — FERRITIN: Ferritin: 634 ng/mL — ABNORMAL HIGH (ref 24–336)

## 2024-05-10 MED ORDER — SODIUM CHLORIDE 0.9 % IV SOLN
7.5000 mg/kg | Freq: Once | INTRAVENOUS | Status: AC
  Administered 2024-05-10: 700 mg via INTRAVENOUS
  Filled 2024-05-10: qty 16

## 2024-05-10 MED ORDER — SODIUM CHLORIDE 0.9 % IV SOLN: INTRAVENOUS | Status: DC

## 2024-05-10 MED ORDER — OXYCODONE HCL 5 MG PO TABS
5.0000 mg | ORAL_TABLET | ORAL | 0 refills | Status: DC | PRN
  Filled 2024-05-10: qty 60, 10d supply, fill #0

## 2024-05-10 NOTE — Patient Instructions (Signed)
 [facility] - A DEPT OF [facility]  Discharge Instructions: Thank you for choosing [facility] to provide your oncology and hematology care.   If you have a lab appointment with the Cancer Center, please go directly to the Cancer Center and check in at the registration area.  Wear comfortable clothing and clothing appropriate for easy access to any Portacath or PICC line.   We strive to give you quality time with your provider. You may need to reschedule your appointment if you arrive late (15 or more minutes).  Arriving late affects you and other patients whose appointments are after yours.  Also, if you miss three or more appointments without notifying the office, you may be dismissed from the clinic at the provider's discretion.      For prescription refill requests, have your pharmacy contact our office and allow 72 hours for refills to be completed.    Today you received the following chemotherapy and/or immunotherapy agents avastin     To help prevent nausea and vomiting after your treatment, we encourage you to take your nausea medication as directed.  BELOW ARE SYMPTOMS THAT SHOULD BE REPORTED IMMEDIATELY: *FEVER GREATER THAN 100.4 F (38 C) OR HIGHER *CHILLS OR SWEATING *NAUSEA AND VOMITING THAT IS NOT CONTROLLED WITH YOUR NAUSEA MEDICATION *UNUSUAL SHORTNESS OF BREATH *UNUSUAL BRUISING OR BLEEDING *URINARY PROBLEMS (pain or burning when urinating, or frequent urination) *BOWEL PROBLEMS (unusual diarrhea, constipation, pain near the anus) TENDERNESS IN MOUTH AND THROAT WITH OR WITHOUT PRESENCE OF ULCERS (sore throat, sores in mouth, or a toothache) UNUSUAL RASH, SWELLING OR PAIN  UNUSUAL VAGINAL DISCHARGE OR ITCHING   Items with * indicate a potential emergency and should be followed up as soon as possible or go to the Emergency Department if any problems should occur.  Please show the CHEMOTHERAPY ALERT CARD or IMMUNOTHERAPY ALERT  CARD at check-in to the Emergency Department and triage nurse. Should you have questions after your visit or need to cancel or reschedule your appointment, please contact [facility] - A DEPT OF [facility]  [phone] and follow the prompts.  Office hours are 8:00 a.m. to 4:30 p.m. Monday - Friday. Please note that voicemails left after 4:00 p.m. may not be returned until the following business day.  We are closed weekends and major holidays. You have access to a nurse at all times for urgent questions. Please call the main number to the clinic [phone] and follow the prompts.  For any non-urgent questions, you may also contact your provider using MyChart. We now offer e-Visits for anyone 71 and older to request care online for non-urgent symptoms. For details visit [email or web address].   Also download the MyChart app! Go to the app store, search "MyChart", open the app, select [place], and log in with your MyChart username and password.

## 2024-05-10 NOTE — Progress Notes (Signed)
 Patient is now on Lonsurf  with fair tolerance. He takes his antiemetics prn. He will start Avastin  today.   Oncology Nurse Navigator Documentation     05/10/2024    1:00 PM  Oncology Nurse Navigator Flowsheets  Navigator Follow Up Date: 05/31/2024  Navigator Follow Up Reason: Follow-up Appointment;Chemotherapy  Navigator Location [facility]  Navigator Encounter Type Treatment  Patient Visit Type MedOnc  Treatment Phase Active Tx  Barriers/Navigation Needs No Barriers At This Time  Interventions Psycho-Social Support  Acuity Level 1-No Barriers  Time Spent with Patient 15

## 2024-05-10 NOTE — Progress Notes (Signed)
 Pharmacist Chemotherapy Monitoring - Initial Assessment    Anticipated start date: 05/10/24   The following has been reviewed per standard work regarding the patient's treatment regimen: The patient's diagnosis, treatment plan and drug doses, and organ/hematologic function Lab orders and baseline tests specific to treatment regimen  The treatment plan start date, drug sequencing, and pre-medications Prior authorization status  Patient's documented medication list, including drug-drug interaction screen and prescriptions for anti-emetics and supportive care specific to the treatment regimen The drug concentrations, fluid compatibility, administration routes, and timing of the medications to be used The patient's access for treatment and lifetime cumulative dose history, if applicable  The patient's medication allergies and previous infusion related reactions, if applicable   Changes made to treatment plan:  UA lab ordered to monitor urine protein with bevacizumab   Follow up needed:  N/A   Norleen JAYSON Sou, RPH, 05/10/2024  11:53 AM

## 2024-05-10 NOTE — Progress Notes (Signed)
 Hematology and Oncology Follow Up Visit  Richard Bowman [id number] Aug 14, 1976 48 y.o. 05/10/2024   Principle Diagnosis:  Metastatic adenocarcinoma of the colon-liver metastasis -- pMMR/MSI-low -- (+) KRAS/ ERBB2(+) Iron deficiency anemia Superficial thrombus of the right forearm   Current Therapy:        FOLFOXIRI - s/p cycle #11  - start on  -- 12/09/2022 --oxaliplatin  dropped after 04/19/2023 IV iron as indicated Xeloda /Avastin  -- s/p cycle #1 on 09/13/2023 - Avastin  on hold - Xeloda  d/c'ed on 10/15/2023 Enhertu  - s/p cycle #2 - start on 11/26/2023 -DC on 04/25/2024 Intrahepatic Y-90 -first dose on 12/23/2023 Xarelto  10 mg p.o. daily-started on 12/19/2023 Lonsurf  80 mg po BID (d1-5;d 8-12) -start on 05/02/2024   Interim History:  Richard Bowman is here today for follow-up and consideration of treatment with Avastin .   He was recently switched from Enhertu  to Avastin /Lonsurf  due to progression of disease on CT scan. CT on 04/14/2024 showed progressive lung metastasis. He reports having his Lonsurf  and has started it. He does report some nausea with this medication. He missed Monday morning's dose due to vomiting. Antiemetics have helped though.   Of note, his last CEA was 10 which was improved from previous of 33.49  He does report that since the Y90 treatment he reports overall feeling worse. Appetite is lower than it was. Fatigue is worse. He does have a few itchy placed on his back and leg.   He denies SOB, hemoptysis, chest pain, diarrhea  S/p port-a-cath removal   Currently, I would have to say that his performance status probably ECOG 1.  Wt Readings from Last 3 Encounters:  05/10/24 200 lb 12.8 oz (91.1 kg)  04/25/24 200 lb (90.7 kg)  03/24/24 202 lb 1.9 oz (91.7 kg)    Medications:  Allergies as of 05/10/2024       Reactions   Xeloda  [capecitabine ] Nausea And Vomiting, Other (See Comments)   Chest pain        Medication List        Accurate as of May 10, 2024  11:24 AM. If you have any questions, ask your nurse or doctor.          acetaminophen  500 MG tablet Commonly known as: TYLENOL  Take 2 tablets (1,000 mg total) by mouth every 6 (six) hours as needed for mild pain.   docusate sodium  100 MG capsule Commonly known as: COLACE Take 1 capsule (100 mg total) by mouth 2 (two) times daily.   dronabinol  5 MG capsule Commonly known as: MARINOL  Take 1 capsule (5 mg total) by mouth 2 (two) times daily before a meal.   gabapentin  300 MG capsule Commonly known as: NEURONTIN  Take 1 capsule (300 mg total) by mouth 3 (three) times daily.   lactose free nutrition Liqd Take 237 mLs by mouth daily as needed (Supplement).   lactulose  10 GM/15ML solution Commonly known as: CHRONULAC  Take 15 mLs (10 g total) by mouth 3 (three) times daily.   lidocaine -prilocaine  cream Commonly known as: EMLA  Apply 1 Application topically as needed.   Lonsurf  20-8.19 MG tablet Generic drug: trifluridine -tipiracil  Take 4 tablets (80 mg of trifluridine  total) by mouth 2 (two) times daily after a meal. Take within 1 hr after AM & PM meals on days 1-5, 8-12. Repeat every 28 days.   LORazepam  0.5 MG tablet Commonly known as: ATIVAN  Place 1 tablet (0.5 mg total) under the tongue every 6 (six) hours as needed for anxiety.   megestrol  400 MG/10ML suspension Commonly  known as: MEGACE  Take 10 mLs (400 mg total) by mouth daily.   megestrol  625 MG/5ML suspension Commonly known as: MEGACE  ES Take 5 mLs (625 mg total) by mouth daily.   methocarbamol  500 MG tablet Commonly known as: ROBAXIN  Take 1 tablet (500 mg total) by mouth every 6 (six) hours as needed for muscle spasms.   metoCLOPramide  10 MG tablet Commonly known as: REGLAN  Take 1 tablet (10 mg total) by mouth every 6 (six) hours.   nystatin  100000 UNIT/ML suspension Commonly known as: MYCOSTATIN  Take 5 mLs (500,000 Units total) by mouth 4 (four) times daily.   OLANZapine  10 MG tablet Commonly known as:  ZYPREXA  Take 1 tablet (10 mg total) by mouth at bedtime.   ondansetron  4 MG disintegrating tablet Commonly known as: ZOFRAN -ODT Take 1 tablet (4 mg total) by mouth every 8 (eight) hours as needed for nausea or vomiting.   oxyCODONE  5 MG immediate release tablet Commonly known as: Oxy IR/ROXICODONE  Take 1 tablet (5 mg total) by mouth every 4 (four) hours as needed for severe pain (pain score 7-10).   pantoprazole  40 MG tablet Commonly known as: Protonix  Take 1 tablet (40 mg total) by mouth 2 (two) times daily.   polyethylene glycol 17 g packet Commonly known as: MIRALAX  / GLYCOLAX  Take 17 g by mouth daily as needed for mild constipation.        Allergies:  Allergies  Allergen Reactions   Xeloda  [Capecitabine ] Nausea And Vomiting and Other (See Comments)    Chest pain    Past Medical History, Surgical history, Social history, and Family History were reviewed and updated.  Review of Systems:  Review of Systems  Constitutional: Negative.   HENT: Negative.    Eyes: Negative.   Respiratory: Negative.    Cardiovascular: Negative.   Gastrointestinal: Negative.   Genitourinary: Negative.   Musculoskeletal: Negative.   Skin: Negative.   Neurological: Negative.   Endo/Heme/Allergies: Negative.   Psychiatric/Behavioral: Negative.      Physical Exam:  weight is 200 lb 12.8 oz (91.1 kg). His oral temperature is 98.1 F (36.7 C). His blood pressure is 122/78 and his pulse is 75. His respiration is 18 and oxygen saturation is 99%.   Wt Readings from Last 3 Encounters:  05/10/24 200 lb 12.8 oz (91.1 kg)  04/25/24 200 lb (90.7 kg)  03/24/24 202 lb 1.9 oz (91.7 kg)   Physical Exam Vitals reviewed.  HENT:     Head: Normocephalic and atraumatic.  Eyes:     Pupils: Pupils are equal, round, and reactive to light.  Cardiovascular:     Rate and Rhythm: Normal rate and regular rhythm.     Heart sounds: Normal heart sounds.  Pulmonary:     Effort: Pulmonary effort is normal.      Breath sounds: Normal breath sounds.  Abdominal:     General: Bowel sounds are normal.     Palpations: Abdomen is soft.  Musculoskeletal:        General: No tenderness or deformity. Normal range of motion.     Cervical back: Normal range of motion.  Lymphadenopathy:     Cervical: No cervical adenopathy.  Skin:    General: Skin is warm and dry.     Findings: No erythema or rash.  Neurological:     Mental Status: He is alert and oriented to person, place, and time.  Psychiatric:        Behavior: Behavior normal.        Thought Content: Thought  content normal.        Judgment: Judgment normal.      Lab Results  Component Value Date   WBC 4.0 05/10/2024   HGB 14.9 05/10/2024   HCT 43.8 05/10/2024   MCV 87.8 05/10/2024   PLT 101 (L) 05/10/2024   Lab Results  Component Value Date   FERRITIN 553 (H) 04/25/2024   IRON 85 04/25/2024   TIBC 350 04/25/2024   UIBC 265 04/25/2024   IRONPCTSAT 24 04/25/2024   Lab Results  Component Value Date   RETICCTPCT 0.7 04/25/2024   RBC 4.99 05/10/2024   No results found for: KPAFRELGTCHN, LAMBDASER, KAPLAMBRATIO No results found for: IGGSERUM, IGA, IGMSERUM No results found for: STEPHANY CARLOTA BENSON MARKEL EARLA JOANNIE DOC VICK, SPEI   Chemistry      Component Value Date/Time   NA 141 04/25/2024 1015   K 4.2 04/25/2024 1015   CL 103 04/25/2024 1015   CO2 26 04/25/2024 1015   BUN 14 04/25/2024 1015   CREATININE 1.03 04/25/2024 1015      Component Value Date/Time   CALCIUM  10.2 04/25/2024 1015   ALKPHOS 358 (H) 04/25/2024 1015   AST 65 (H) 04/25/2024 1015   ALT 87 (H) 04/25/2024 1015   BILITOT 0.6 04/25/2024 1015     Encounter Diagnoses  Name Primary?   Colon cancer metastasized to liver [facility]) Yes   Metastasis to liver (HCC)    Hyperkalemia    Port-A-Cath in place    Neuropathy     Impression and Plan: Mr. Retter is a very nice 48 yo caucasian gentleman with  metastatic colon cancer. He has now had progression on Enhertu . He is here for consideration of Avastin  to go along with his Lonsurf .   Review of CBC shows thrombocytopenia which we will watch CMP pending.  Iron pending.   TX today RTC 3 weeks MD, labs (CBC w/, CMP, ferritin, iron, CEA), Avastin   Lauraine CHRISTELLA Dais, PA-C 9/10/202511:24 AM

## 2024-05-14 ENCOUNTER — Other Ambulatory Visit: Payer: Self-pay

## 2024-05-16 ENCOUNTER — Encounter: Payer: Self-pay | Admitting: Hematology & Oncology

## 2024-05-17 ENCOUNTER — Other Ambulatory Visit: Payer: Self-pay

## 2024-05-17 NOTE — Progress Notes (Signed)
 Specialty Pharmacy Refill Coordination Note  Richard Bowman is a 49 y.o. male contacted today regarding refills of specialty medication(s) Trifluridine -Tipiracil  (Lonsurf )   Patient requested [name] at [facility] [place] date: 05/31/24   Medication will be filled on 05/30/2024.

## 2024-05-17 NOTE — Progress Notes (Signed)
 Specialty Pharmacy Ongoing Clinical Assessment Note  Richard Bowman is a 48 y.o. male who is being followed by the specialty pharmacy service for RxSp Oncology   Patient's specialty medication(s) reviewed today: Trifluridine -Tipiracil  (Lonsurf )   Missed doses in the last 4 weeks: 0   Patient/Caregiver asked additional questions regarding efficacy of therapy and other treatment options/future directions of care. Discussed with patient who will follow-up with provider on 05/31/24.  Therapeutic benefit summary: Unable to assess   Adverse events/side effects summary: No adverse events/side effects   Patient's therapy is appropriate to: Continue    Goals Addressed             This Visit's Progress    Slow Disease Progression       Patient is unable to be assessed as therapy was recently initiated. Patient will be monitored by provider to determine if a change in treatment plan is warranted. Patient has experienced continued loss of appetite and nausea/vomiting which he attributes to his Y90 treatment. Stated that has been feeling worse overall while on current therapies and is unsure if would like to continue with current treatment plan. Next appointment with Dr. Timmy 05/31/24 to assess plan moving forward and efficacy.         Follow up: 3 months  Richard Bowman Specialty Pharmacist

## 2024-05-24 ENCOUNTER — Other Ambulatory Visit: Payer: Self-pay | Admitting: Hematology & Oncology

## 2024-05-24 ENCOUNTER — Other Ambulatory Visit (HOSPITAL_BASED_OUTPATIENT_CLINIC_OR_DEPARTMENT_OTHER): Payer: Self-pay

## 2024-05-24 ENCOUNTER — Other Ambulatory Visit: Payer: Self-pay

## 2024-05-24 DIAGNOSIS — C787 Secondary malignant neoplasm of liver and intrahepatic bile duct: Secondary | ICD-10-CM

## 2024-05-24 MED ORDER — OXYCODONE HCL 5 MG PO TABS
5.0000 mg | ORAL_TABLET | ORAL | 0 refills | Status: DC | PRN
  Filled 2024-05-24: qty 60, 10d supply, fill #0

## 2024-05-26 ENCOUNTER — Other Ambulatory Visit: Payer: Self-pay

## 2024-05-30 ENCOUNTER — Other Ambulatory Visit: Payer: Self-pay

## 2024-05-30 ENCOUNTER — Encounter: Payer: Self-pay | Admitting: Hematology & Oncology

## 2024-05-30 ENCOUNTER — Other Ambulatory Visit (HOSPITAL_COMMUNITY): Payer: Self-pay

## 2024-05-31 ENCOUNTER — Inpatient Hospital Stay: Attending: Hematology & Oncology

## 2024-05-31 ENCOUNTER — Encounter: Payer: Self-pay | Admitting: *Deleted

## 2024-05-31 ENCOUNTER — Inpatient Hospital Stay

## 2024-05-31 ENCOUNTER — Inpatient Hospital Stay: Admitting: Hematology & Oncology

## 2024-05-31 DIAGNOSIS — C787 Secondary malignant neoplasm of liver and intrahepatic bile duct: Secondary | ICD-10-CM | POA: Insufficient documentation

## 2024-05-31 DIAGNOSIS — C189 Malignant neoplasm of colon, unspecified: Secondary | ICD-10-CM | POA: Insufficient documentation

## 2024-05-31 DIAGNOSIS — D509 Iron deficiency anemia, unspecified: Secondary | ICD-10-CM | POA: Insufficient documentation

## 2024-05-31 DIAGNOSIS — Z5112 Encounter for antineoplastic immunotherapy: Secondary | ICD-10-CM | POA: Insufficient documentation

## 2024-05-31 NOTE — Progress Notes (Unsigned)
 Patient was due to come in today for provider visit and treatment. He is a no-show. Message sent to scheduling to reschedule appointment.   Oncology Nurse Navigator Documentation     05/31/2024    8:30 AM  Oncology Nurse Navigator Flowsheets  Navigator Follow Up Date: 06/05/2024  Navigator Follow Up Reason: Follow-up Appointment  Navigator Location [facility]  Navigator Encounter Type Appt/Treatment Plan Review  Patient Visit Type MedOnc  Treatment Phase Active Tx  Barriers/Navigation Needs No Barriers At This Time  Interventions Coordination of Care  Acuity Level 1-No Barriers  Coordination of Care Appts  Time Spent with Patient 15

## 2024-06-01 ENCOUNTER — Telehealth: Payer: Self-pay | Admitting: Hematology & Oncology

## 2024-06-01 NOTE — Telephone Encounter (Signed)
 Called to reschedule missed appts. LVM to return call for scheduling.

## 2024-06-02 ENCOUNTER — Encounter: Payer: Self-pay | Admitting: Hematology & Oncology

## 2024-06-05 ENCOUNTER — Encounter: Payer: Self-pay | Admitting: Hematology & Oncology

## 2024-06-05 ENCOUNTER — Inpatient Hospital Stay

## 2024-06-05 ENCOUNTER — Encounter: Payer: Self-pay | Admitting: *Deleted

## 2024-06-05 ENCOUNTER — Inpatient Hospital Stay: Admitting: Hematology & Oncology

## 2024-06-05 VITALS — BP 131/85 | HR 56 | Temp 98.2°F | Resp 18 | Ht 70.0 in | Wt 194.4 lb

## 2024-06-05 VITALS — BP 134/82 | HR 59

## 2024-06-05 DIAGNOSIS — C787 Secondary malignant neoplasm of liver and intrahepatic bile duct: Secondary | ICD-10-CM

## 2024-06-05 DIAGNOSIS — D509 Iron deficiency anemia, unspecified: Secondary | ICD-10-CM | POA: Diagnosis not present

## 2024-06-05 DIAGNOSIS — Z5112 Encounter for antineoplastic immunotherapy: Secondary | ICD-10-CM | POA: Diagnosis present

## 2024-06-05 DIAGNOSIS — E875 Hyperkalemia: Secondary | ICD-10-CM

## 2024-06-05 DIAGNOSIS — C189 Malignant neoplasm of colon, unspecified: Secondary | ICD-10-CM

## 2024-06-05 DIAGNOSIS — G629 Polyneuropathy, unspecified: Secondary | ICD-10-CM

## 2024-06-05 LAB — CMP (CANCER CENTER ONLY)
ALT: 74 U/L — ABNORMAL HIGH (ref 0–44)
AST: 68 U/L — ABNORMAL HIGH (ref 15–41)
Albumin: 4.2 g/dL (ref 3.5–5.0)
Alkaline Phosphatase: 419 U/L — ABNORMAL HIGH (ref 38–126)
Anion gap: 12 (ref 5–15)
BUN: 15 mg/dL (ref 6–20)
CO2: 26 mmol/L (ref 22–32)
Calcium: 9.5 mg/dL (ref 8.9–10.3)
Chloride: 103 mmol/L (ref 98–111)
Creatinine: 1.01 mg/dL (ref 0.61–1.24)
GFR, Estimated: >60 mL/min (ref >60)
Glucose, Bld: 108 mg/dL — ABNORMAL HIGH (ref 70–99)
Potassium: 3.6 mmol/L (ref 3.5–5.1)
Sodium: 141 mmol/L (ref 135–145)
Total Bilirubin: 0.6 mg/dL (ref 0.0–1.2)
Total Protein: 7.3 g/dL (ref 6.5–8.1)

## 2024-06-05 LAB — CBC WITH DIFFERENTIAL (CANCER CENTER ONLY)
Abs Immature Granulocytes: 0.02 K/uL (ref 0.00–0.07)
Basophils Absolute: 0 K/uL (ref 0.0–0.1)
Basophils Relative: 0 %
Eosinophils Absolute: 0.1 K/uL (ref 0.0–0.5)
Eosinophils Relative: 2 %
HCT: 41.7 % (ref 39.0–52.0)
Hemoglobin: 14.2 g/dL (ref 13.0–17.0)
Immature Granulocytes: 1 %
Lymphocytes Relative: 35 %
Lymphs Abs: 1.4 K/uL (ref 0.7–4.0)
MCH: 30.5 pg (ref 26.0–34.0)
MCHC: 34.1 g/dL (ref 30.0–36.0)
MCV: 89.5 fL (ref 80.0–100.0)
Monocytes Absolute: 0.5 K/uL (ref 0.1–1.0)
Monocytes Relative: 12 %
Neutro Abs: 2 K/uL (ref 1.7–7.7)
Neutrophils Relative %: 50 %
Platelet Count: 80 K/uL — ABNORMAL LOW (ref 150–400)
RBC: 4.66 MIL/uL (ref 4.22–5.81)
RDW: 15 % (ref 11.5–15.5)
WBC Count: 4 K/uL (ref 4.0–10.5)
nRBC: 0 % (ref 0.0–0.2)

## 2024-06-05 LAB — TOTAL PROTEIN, URINE DIPSTICK

## 2024-06-05 LAB — IRON AND TIBC
Iron: 69 ug/dL (ref 45–182)
Saturation Ratios: 23 % (ref 17.9–39.5)
TIBC: 295 ug/dL (ref 250–450)
UIBC: 226 ug/dL

## 2024-06-05 LAB — FERRITIN: Ferritin: 385 ng/mL — ABNORMAL HIGH (ref 24–336)

## 2024-06-05 LAB — CEA (ACCESS): CEA (CHCC): 25.33 ng/mL — ABNORMAL HIGH (ref 0.00–5.00)

## 2024-06-05 MED ORDER — SODIUM CHLORIDE 0.9 % IV SOLN: INTRAVENOUS | Status: DC

## 2024-06-05 MED ORDER — SODIUM CHLORIDE 0.9 % IV SOLN
7.5000 mg/kg | Freq: Once | INTRAVENOUS | Status: AC
  Administered 2024-06-05: 700 mg via INTRAVENOUS
  Filled 2024-06-05: qty 16

## 2024-06-05 NOTE — Progress Notes (Signed)
 Patient was a no-show to last weeks appointment. Rescheduled to today. He proceeded with cycle two of his Avastin /Lonsurf .   Oncology Nurse Navigator Documentation     06/05/2024    9:45 AM  Oncology Nurse Navigator Flowsheets  Navigator Follow Up Date: 06/26/2024  Navigator Follow Up Reason: Follow-up Appointment;Chemotherapy  Navigator Location [facility]  Navigator Encounter Type Treatment  Patient Visit Type MedOnc  Treatment Phase Active Tx  Barriers/Navigation Needs No Barriers At This Time  Interventions Psycho-Social Support  Acuity Level 1-No Barriers  Time Spent with Patient 15

## 2024-06-05 NOTE — Progress Notes (Signed)
 Ok to treat with platelets of 80,000 per Dr. Timmy. Richard Bowman, [facility] 06/05/24 11:22 AM

## 2024-06-05 NOTE — Progress Notes (Signed)
 Hematology and Oncology Follow Up Visit  OPIE MACLAUGHLIN [id number] [birth year] 48 y.o. 06/05/2024   Principle Diagnosis:  Metastatic adenocarcinoma of the colon-liver metastasis -- pMMR/MSI-low -- (+) KRAS/ ERBB2(+) Iron deficiency anemia Superficial thrombus of the right forearm   Current Therapy:        FOLFOXIRI - s/p cycle #11  - start on  -- 12/09/2022 --oxaliplatin  dropped after 04/19/2023 IV iron as indicated Xeloda /Avastin  -- s/p cycle #1 on 09/13/2023 - Avastin  on hold - Xeloda  d/c'ed on 10/15/2023 Enhertu  - s/p cycle #2 - start on 11/26/2023 -DC on 04/25/2024 Intrahepatic Y-90 -first dose on 12/23/2023 Xarelto  10 mg p.o. daily-started on 12/19/2023 Avastin /Lonsurf  80 mg po BID (d1-5;d 8-12) -s/p cycle #1 - start on 05/02/2024   Interim History:  Mr. Grider is here today for follow-up.  He is managing about as well as possible.  I know that he has issues with respect to medications.  I think that he cannot get the Marinol .  He is not taking the Megace .  I really think that Megace  may help with his appetite.  He is losing a little bit of weight.  He has had no problems with bleeding.  He has had no obvious change in bowel or bladder habits.  Pain control seems to be doing fairly well right now.  We will have to see what his CEA level is.  He is doing well on the Xarelto .  He has not noted any problems with his right forearm.  He has had no fever.  He has had no problems with COVID.  Currently, I would have to say that his performance status is probably ECOG 1.    Wt Readings from Last 3 Encounters:  06/05/24 194 lb 6.4 oz (88.2 kg)  05/10/24 200 lb 12.8 oz (91.1 kg)  04/25/24 200 lb (90.7 kg)    Medications:  Allergies as of 06/05/2024       Reactions   Xeloda  [capecitabine ] Nausea And Vomiting, Other (See Comments)   Chest pain        Medication List        Accurate as of June 05, 2024 10:17 AM. If you have any questions, ask your nurse or doctor.           acetaminophen  500 MG tablet Commonly known as: TYLENOL  Take 2 tablets (1,000 mg total) by mouth every 6 (six) hours as needed for mild pain.   docusate sodium  100 MG capsule Commonly known as: COLACE Take 1 capsule (100 mg total) by mouth 2 (two) times daily.   dronabinol  5 MG capsule Commonly known as: MARINOL  Take 1 capsule (5 mg total) by mouth 2 (two) times daily before a meal.   gabapentin  300 MG capsule Commonly known as: NEURONTIN  Take 1 capsule (300 mg total) by mouth 3 (three) times daily.   lactose free nutrition Liqd Take 237 mLs by mouth daily as needed (Supplement).   lactulose  10 GM/15ML solution Commonly known as: CHRONULAC  Take 15 mLs (10 g total) by mouth 3 (three) times daily.   lidocaine -prilocaine  cream Commonly known as: EMLA  Apply 1 Application topically as needed.   Lonsurf  20-8.19 MG tablet Generic drug: trifluridine -tipiracil  Take 4 tablets (80 mg of trifluridine  total) by mouth 2 (two) times daily after a meal. Take within 1 hr after AM & PM meals on days 1-5, 8-12. Repeat every 28 days.   LORazepam  0.5 MG tablet Commonly known as: ATIVAN  Place 1 tablet (0.5 mg total) under the tongue every 6 (  six) hours as needed for anxiety.   megestrol  400 MG/10ML suspension Commonly known as: MEGACE  Take 10 mLs (400 mg total) by mouth daily.   megestrol  625 MG/5ML suspension Commonly known as: MEGACE  ES Take 5 mLs (625 mg total) by mouth daily.   methocarbamol  500 MG tablet Commonly known as: ROBAXIN  Take 1 tablet (500 mg total) by mouth every 6 (six) hours as needed for muscle spasms.   metoCLOPramide  10 MG tablet Commonly known as: REGLAN  Take 1 tablet (10 mg total) by mouth every 6 (six) hours.   nystatin  100000 UNIT/ML suspension Commonly known as: MYCOSTATIN  Take 5 mLs (500,000 Units total) by mouth 4 (four) times daily.   OLANZapine  10 MG tablet Commonly known as: ZYPREXA  Take 1 tablet (10 mg total) by mouth at bedtime.    ondansetron  4 MG disintegrating tablet Commonly known as: ZOFRAN -ODT Take 1 tablet (4 mg total) by mouth every 8 (eight) hours as needed for nausea or vomiting.   oxyCODONE  5 MG immediate release tablet Commonly known as: Oxy IR/ROXICODONE  Take 1 tablet (5 mg total) by mouth every 4 (four) hours as needed for severe pain (pain score 7-10).   pantoprazole  40 MG tablet Commonly known as: Protonix  Take 1 tablet (40 mg total) by mouth 2 (two) times daily.   polyethylene glycol 17 g packet Commonly known as: MIRALAX  / GLYCOLAX  Take 17 g by mouth daily as needed for mild constipation.        Allergies:  Allergies  Allergen Reactions   Xeloda  [Capecitabine ] Nausea And Vomiting and Other (See Comments)    Chest pain    Past Medical History, Surgical history, Social history, and Family History were reviewed and updated.  Review of Systems:  Review of Systems  Constitutional: Negative.   HENT: Negative.    Eyes: Negative.   Respiratory: Negative.    Cardiovascular: Negative.   Gastrointestinal: Negative.   Genitourinary: Negative.   Musculoskeletal: Negative.   Skin: Negative.   Neurological: Negative.   Endo/Heme/Allergies: Negative.   Psychiatric/Behavioral: Negative.      Physical Exam:  height is 5' 10 (1.778 m) and weight is 194 lb 6.4 oz (88.2 kg). His oral temperature is 98.2 F (36.8 C). His blood pressure is 131/85 and his pulse is 56 (abnormal). His respiration is 18 and oxygen saturation is 97%.   Wt Readings from Last 3 Encounters:  06/05/24 194 lb 6.4 oz (88.2 kg)  05/10/24 200 lb 12.8 oz (91.1 kg)  04/25/24 200 lb (90.7 kg)   Physical Exam Vitals reviewed.  HENT:     Head: Normocephalic and atraumatic.  Eyes:     Pupils: Pupils are equal, round, and reactive to light.  Cardiovascular:     Rate and Rhythm: Normal rate and regular rhythm.     Heart sounds: Normal heart sounds.  Pulmonary:     Effort: Pulmonary effort is normal.     Breath sounds:  Normal breath sounds.  Abdominal:     General: Bowel sounds are normal.     Palpations: Abdomen is soft.  Musculoskeletal:        General: No tenderness or deformity. Normal range of motion.     Cervical back: Normal range of motion.  Lymphadenopathy:     Cervical: No cervical adenopathy.  Skin:    General: Skin is warm and dry.     Findings: No erythema or rash.  Neurological:     Mental Status: He is alert and oriented to person, place, and time.  Psychiatric:        Behavior: Behavior normal.        Thought Content: Thought content normal.        Judgment: Judgment normal.      Lab Results  Component Value Date   WBC 4.0 06/05/2024   HGB 14.2 06/05/2024   HCT 41.7 06/05/2024   MCV 89.5 06/05/2024   PLT 80 (L) 06/05/2024   Lab Results  Component Value Date   FERRITIN 634 (H) 05/10/2024   IRON 136 05/10/2024   TIBC 308 05/10/2024   UIBC 172 05/10/2024   IRONPCTSAT 44 (H) 05/10/2024   Lab Results  Component Value Date   RETICCTPCT 0.7 04/25/2024   RBC 4.66 06/05/2024   No results found for: KPAFRELGTCHN, LAMBDASER, KAPLAMBRATIO No results found for: IGGSERUM, IGA, IGMSERUM No results found for: STEPHANY CARLOTA BENSON MARKEL EARLA JOANNIE DOC VICK, SPEI   Chemistry      Component Value Date/Time   NA 139 05/10/2024 1043   K 3.7 05/10/2024 1043   CL 102 05/10/2024 1043   CO2 25 05/10/2024 1043   BUN 20 05/10/2024 1043   CREATININE 0.97 05/10/2024 1043      Component Value Date/Time   CALCIUM  9.9 05/10/2024 1043   ALKPHOS 364 (H) 05/10/2024 1043   AST 79 (H) 05/10/2024 1043   ALT 95 (H) 05/10/2024 1043   BILITOT 0.9 05/10/2024 1043       Impression and Plan: Mr. Morrell is a very nice 48 yo caucasian gentleman with metastatic colon cancer. He had progression on Enhertu .  Currently, he is on Lonsurf /Avastin .  Hopefully, he will respond to this.  It is still a bit too early for us  to know how things are going.   I do need to see what his CEA level is.  For right now, we will get him back to see us  in another 3 weeks.  I told him that his dose of Lonsurf  and does not change with different cycles.  I will go ahead and plan to do the Avastin  today.  I will probably keep him on the low-dose of Xarelto .    Maude JONELLE Crease, MD 10/6/202510:17 AM

## 2024-06-05 NOTE — Patient Instructions (Signed)
 Bevacizumab Injection What is this medication? BEVACIZUMAB (be va SIZ yoo mab) treats some types of cancer. It works by blocking a protein that causes cancer cells to grow and multiply. This helps to slow or stop the spread of cancer cells. It is a monoclonal antibody. This medicine may be used for other purposes; ask your health care provider or pharmacist if you have questions. COMMON BRAND NAME(S): Alymsys, Avastin, MVASI, Rosaland Lao What should I tell my care team before I take this medication? They need to know if you have any of these conditions: Blood clots Coughing up blood Having or recent surgery Heart failure High blood pressure History of a connection between 2 or more body parts that do not usually connect (fistula) History of a tear in your stomach or intestines Protein in your urine An unusual or allergic reaction to bevacizumab, other medications, foods, dyes, or preservatives Pregnant or trying to get pregnant Breast-feeding How should I use this medication? This medication is injected into a vein. It is given by your care team in a hospital or clinic setting. Talk to your care team the use of this medication in children. Special care may be needed. Overdosage: If you think you have taken too much of this medicine contact a poison control center or emergency room at once. NOTE: This medicine is only for you. Do not share this medicine with others. What if I miss a dose? Keep appointments for follow-up doses. It is important not to miss your dose. Call your care team if you are unable to keep an appointment. What may interact with this medication? Interactions are not expected. This list may not describe all possible interactions. Give your health care provider a list of all the medicines, herbs, non-prescription drugs, or dietary supplements you use. Also tell them if you smoke, drink alcohol, or use illegal drugs. Some items may interact with your medicine. What  should I watch for while using this medication? Your condition will be monitored carefully while you are receiving this medication. You may need blood work while taking this medication. This medication may make you feel generally unwell. This is not uncommon as chemotherapy can affect healthy cells as well as cancer cells. Report any side effects. Continue your course of treatment even though you feel ill unless your care team tells you to stop. This medication may increase your risk to bruise or bleed. Call your care team if you notice any unusual bleeding. Before having surgery, talk to your care team to make sure it is ok. This medication can increase the risk of poor healing of your surgical site or wound. You will need to stop this medication for 28 days before surgery. After surgery, wait at least 28 days before restarting this medication. Make sure the surgical site or wound is healed enough before restarting this medication. Talk to your care team if questions. Talk to your care team if you may be pregnant. Serious birth defects can occur if you take this medication during pregnancy and for 6 months after the last dose. Contraception is recommended while taking this medication and for 6 months after the last dose. Your care team can help you find the option that works for you. Do not breastfeed while taking this medication and for 6 months after the last dose. This medication can cause infertility. Talk to your care team if you are concerned about your fertility. What side effects may I notice from receiving this medication? Side effects that you should  report to your care team as soon as possible: Allergic reactions--skin rash, itching, hives, swelling of the face, lips, tongue, or throat Bleeding--bloody or black, tar-like stools, vomiting blood or brown material that looks like coffee grounds, red or dark brown urine, small red or purple spots on skin, unusual bruising or bleeding Blood  clot--pain, swelling, or warmth in the leg, shortness of breath, chest pain Heart attack--pain or tightness in the chest, shoulders, arms, or jaw, nausea, shortness of breath, cold or clammy skin, feeling faint or lightheaded Heart failure--shortness of breath, swelling of the ankles, feet, or hands, sudden weight gain, unusual weakness or fatigue Increase in blood pressure Infection--fever, chills, cough, sore throat, wounds that don't heal, pain or trouble when passing urine, general feeling of discomfort or being unwell Infusion reactions--chest pain, shortness of breath or trouble breathing, feeling faint or lightheaded Kidney injury--decrease in the amount of urine, swelling of the ankles, hands, or feet Stomach pain that is severe, does not go away, or gets worse Stroke--sudden numbness or weakness of the face, arm, or leg, trouble speaking, confusion, trouble walking, loss of balance or coordination, dizziness, severe headache, change in vision Sudden and severe headache, confusion, change in vision, seizures, which may be signs of posterior reversible encephalopathy syndrome (PRES) Side effects that usually do not require medical attention (report to your care team if they continue or are bothersome): Back pain Change in taste Diarrhea Dry skin Increased tears Nosebleed This list may not describe all possible side effects. Call your doctor for medical advice about side effects. You may report side effects to FDA at 1-800-FDA-1088. Where should I keep my medication? This medication is given in a hospital or clinic. It will not be stored at home. NOTE: This sheet is a summary. It may not cover all possible information. If you have questions about this medicine, talk to your doctor, pharmacist, or health care provider.  2024 Elsevier/Gold Standard (2022-01-02 00:00:00)

## 2024-06-06 ENCOUNTER — Other Ambulatory Visit: Payer: Self-pay

## 2024-06-08 ENCOUNTER — Other Ambulatory Visit (HOSPITAL_BASED_OUTPATIENT_CLINIC_OR_DEPARTMENT_OTHER): Payer: Self-pay

## 2024-06-08 ENCOUNTER — Other Ambulatory Visit: Payer: Self-pay

## 2024-06-08 ENCOUNTER — Other Ambulatory Visit: Payer: Self-pay | Admitting: Hematology & Oncology

## 2024-06-08 DIAGNOSIS — C189 Malignant neoplasm of colon, unspecified: Secondary | ICD-10-CM

## 2024-06-08 MED ORDER — OXYCODONE HCL 5 MG PO TABS
5.0000 mg | ORAL_TABLET | ORAL | 0 refills | Status: DC | PRN
  Filled 2024-06-08: qty 60, 10d supply, fill #0

## 2024-06-15 ENCOUNTER — Telehealth: Payer: Self-pay | Admitting: *Deleted

## 2024-06-15 ENCOUNTER — Other Ambulatory Visit: Payer: Self-pay

## 2024-06-15 NOTE — Telephone Encounter (Signed)
 Message received from patient stating that he has been throwing up blood the last few times and would like to know what to do.  Call placed back to patient and patient states that he has been vomiting bright red blood for approximately TWO weeks.  Pt instructed to have a family member drive him to the [facility] ER now.  Pt states that he will do so at this time. Dr. Timmy notified.

## 2024-06-21 ENCOUNTER — Ambulatory Visit

## 2024-06-21 ENCOUNTER — Inpatient Hospital Stay

## 2024-06-21 ENCOUNTER — Ambulatory Visit: Admitting: Medical Oncology

## 2024-06-22 ENCOUNTER — Telehealth: Payer: Self-pay

## 2024-06-22 ENCOUNTER — Other Ambulatory Visit: Payer: Self-pay

## 2024-06-22 ENCOUNTER — Other Ambulatory Visit (HOSPITAL_BASED_OUTPATIENT_CLINIC_OR_DEPARTMENT_OTHER): Payer: Self-pay

## 2024-06-22 ENCOUNTER — Other Ambulatory Visit: Payer: Self-pay | Admitting: Hematology & Oncology

## 2024-06-22 ENCOUNTER — Other Ambulatory Visit (HOSPITAL_COMMUNITY): Payer: Self-pay

## 2024-06-22 DIAGNOSIS — C189 Malignant neoplasm of colon, unspecified: Secondary | ICD-10-CM

## 2024-06-22 MED ORDER — OXYCODONE HCL 5 MG PO TABS
5.0000 mg | ORAL_TABLET | ORAL | 0 refills | Status: DC | PRN
  Filled 2024-06-22: qty 60, 10d supply, fill #0

## 2024-06-22 NOTE — Telephone Encounter (Signed)
 Oral Oncology Patient Advocate Encounter   Received notification that prior authorization for MEGACE  ES is required.   PA submitted on 06/22/24 Key [id number] Status is pending     Charlott Hamilton,  CPhT-Adv  she/her/hers [facility]  [facility] Specialty Pharmacy Services Pharmacy Technician Patient Advocate Specialist III [facility] Phone: [phone]  Fax: [phone] [email or web address]

## 2024-06-23 ENCOUNTER — Other Ambulatory Visit (HOSPITAL_COMMUNITY): Payer: Self-pay

## 2024-06-23 ENCOUNTER — Other Ambulatory Visit (HOSPITAL_BASED_OUTPATIENT_CLINIC_OR_DEPARTMENT_OTHER): Payer: Self-pay

## 2024-06-23 ENCOUNTER — Telehealth: Payer: Self-pay | Admitting: Pharmacist

## 2024-06-23 ENCOUNTER — Other Ambulatory Visit: Payer: Self-pay | Admitting: *Deleted

## 2024-06-23 MED ORDER — MEGESTROL ACETATE 400 MG/10ML PO SUSP
400.0000 mg | Freq: Every day | ORAL | 3 refills | Status: DC
  Filled 2024-06-23: qty 240, 24d supply, fill #0

## 2024-06-23 NOTE — Telephone Encounter (Signed)
 Oral Oncology Patient Advocate Encounter   Hi, can we get an urgent appeal for the prior authorization denial of Megace  Es Susp  started?   This encounter will continue to be updated until final appeal determination.       Charlott Hamilton,  CPhT-Adv  she/her/hers [facility]  [facility] Specialty Pharmacy Services Pharmacy Technician Patient Advocate Specialist III [facility] Phone: [phone]  Fax: [phone] [email or web address]

## 2024-06-23 NOTE — Telephone Encounter (Signed)
 Per the insurance denial, the patient is required to try two preferred medications, which for Medicaid are megestrol  suspension and megestrol  tablets. The patient has been prescribed the suspension in the past but has not filled it.  In order to proceed with an appeal, we will need clinical documentation or a medical reason explaining why the patient is unable to take or try the preferred products. Please advise how you would like to proceed.  Thank you, Devere Pandy, PharmD Clinical Pharmacist  [place]  Direct Dial: [phone]

## 2024-06-23 NOTE — Telephone Encounter (Signed)
 Oral Oncology Patient Advocate Encounter  Received notification that the request for prior authorization for Megace  Es Susp has been denied due to .      Charlott Hamilton,  CPhT-Adv  she/her/hers [facility]  [facility] Specialty Pharmacy Services Pharmacy Technician Patient Advocate Specialist III [facility] Phone: [phone]  Fax: [phone] [email or web address]

## 2024-06-26 ENCOUNTER — Other Ambulatory Visit (HOSPITAL_COMMUNITY): Payer: Self-pay

## 2024-06-26 ENCOUNTER — Inpatient Hospital Stay: Admitting: Medical Oncology

## 2024-06-26 ENCOUNTER — Other Ambulatory Visit (HOSPITAL_BASED_OUTPATIENT_CLINIC_OR_DEPARTMENT_OTHER): Payer: Self-pay

## 2024-06-26 ENCOUNTER — Inpatient Hospital Stay

## 2024-06-26 NOTE — Telephone Encounter (Signed)
 Oral Oncology Patient Advocate Encounter  The medication has been switched from Megace  ES Susp to Megace  Susp.  This medication is covered through [facility]. Copayment is $4.00     Charlott Hamilton,  CPhT-Adv  she/her/hers [facility]  [facility] Specialty Pharmacy Services Pharmacy Technician Patient Advocate Specialist III [facility] Phone: [phone]  Fax: [phone] [email or web address]

## 2024-06-27 ENCOUNTER — Encounter: Payer: Self-pay | Admitting: *Deleted

## 2024-06-27 NOTE — Progress Notes (Signed)
 Patient was a no-show to his appointment yesterday. Message sent to scheduling to reschedule.   Oncology Nurse Navigator Documentation     06/27/2024   12:30 PM  Oncology Nurse Navigator Flowsheets  Navigator Follow Up Date: 07/04/2024  Navigator Follow Up Reason: Follow-up Appointment  Navigator Location [facility]  Navigator Encounter Type Appt/Treatment Plan Review  Patient Visit Type MedOnc  Treatment Phase Active Tx  Barriers/Navigation Needs No Barriers At This Time  Interventions Coordination of Care  Acuity Level 1-No Barriers  Coordination of Care Appts  Time Spent with Patient 15

## 2024-06-28 ENCOUNTER — Other Ambulatory Visit: Payer: Self-pay

## 2024-06-29 ENCOUNTER — Telehealth: Payer: Self-pay | Admitting: Hematology & Oncology

## 2024-06-29 NOTE — Telephone Encounter (Signed)
 lvm for pt to return call for missed tx on 06/26/24.

## 2024-07-03 ENCOUNTER — Other Ambulatory Visit (HOSPITAL_BASED_OUTPATIENT_CLINIC_OR_DEPARTMENT_OTHER): Payer: Self-pay

## 2024-07-03 ENCOUNTER — Encounter: Payer: Self-pay | Admitting: Hematology & Oncology

## 2024-07-04 ENCOUNTER — Other Ambulatory Visit (HOSPITAL_BASED_OUTPATIENT_CLINIC_OR_DEPARTMENT_OTHER): Payer: Self-pay

## 2024-07-04 ENCOUNTER — Encounter: Payer: Self-pay | Admitting: *Deleted

## 2024-07-04 ENCOUNTER — Inpatient Hospital Stay

## 2024-07-04 ENCOUNTER — Encounter: Payer: Self-pay | Admitting: Family

## 2024-07-04 ENCOUNTER — Ambulatory Visit: Payer: Self-pay | Admitting: Hematology & Oncology

## 2024-07-04 ENCOUNTER — Other Ambulatory Visit: Payer: Self-pay

## 2024-07-04 ENCOUNTER — Inpatient Hospital Stay: Admitting: Family

## 2024-07-04 ENCOUNTER — Inpatient Hospital Stay: Attending: Hematology & Oncology

## 2024-07-04 VITALS — BP 113/75 | HR 59 | Temp 98.0°F | Resp 18 | Ht 70.0 in | Wt 186.0 lb

## 2024-07-04 DIAGNOSIS — C787 Secondary malignant neoplasm of liver and intrahepatic bile duct: Secondary | ICD-10-CM | POA: Diagnosis not present

## 2024-07-04 DIAGNOSIS — C189 Malignant neoplasm of colon, unspecified: Secondary | ICD-10-CM | POA: Diagnosis present

## 2024-07-04 DIAGNOSIS — D509 Iron deficiency anemia, unspecified: Secondary | ICD-10-CM | POA: Diagnosis not present

## 2024-07-04 DIAGNOSIS — R519 Headache, unspecified: Secondary | ICD-10-CM

## 2024-07-04 DIAGNOSIS — Z79899 Other long term (current) drug therapy: Secondary | ICD-10-CM | POA: Insufficient documentation

## 2024-07-04 DIAGNOSIS — R112 Nausea with vomiting, unspecified: Secondary | ICD-10-CM

## 2024-07-04 LAB — CMP (CANCER CENTER ONLY)
ALT: 96 U/L — ABNORMAL HIGH (ref 0–44)
AST: 79 U/L — ABNORMAL HIGH (ref 15–41)
Albumin: 4.3 g/dL (ref 3.5–5.0)
Alkaline Phosphatase: 392 U/L — ABNORMAL HIGH (ref 38–126)
Anion gap: 11 (ref 5–15)
BUN: 13 mg/dL (ref 6–20)
CO2: 26 mmol/L (ref 22–32)
Calcium: 10 mg/dL (ref 8.9–10.3)
Chloride: 105 mmol/L (ref 98–111)
Creatinine: 0.93 mg/dL (ref 0.61–1.24)
GFR, Estimated: >60 mL/min (ref >60)
Glucose, Bld: 92 mg/dL (ref 70–99)
Potassium: 4.1 mmol/L (ref 3.5–5.1)
Sodium: 142 mmol/L (ref 135–145)
Total Bilirubin: 0.8 mg/dL (ref 0.0–1.2)
Total Protein: 7.4 g/dL (ref 6.5–8.1)

## 2024-07-04 LAB — IRON AND IRON BINDING CAPACITY (CC-WL,HP ONLY)
Iron: 88 ug/dL (ref 45–182)
Saturation Ratios: 29 % (ref 17.9–39.5)
TIBC: 301 ug/dL (ref 250–450)
UIBC: 213 ug/dL

## 2024-07-04 LAB — CBC WITH DIFFERENTIAL (CANCER CENTER ONLY)
Abs Immature Granulocytes: 0.01 K/uL (ref 0.00–0.07)
Basophils Absolute: 0 K/uL (ref 0.0–0.1)
Basophils Relative: 0 %
Eosinophils Absolute: 0.1 K/uL (ref 0.0–0.5)
Eosinophils Relative: 3 %
HCT: 40.7 % (ref 39.0–52.0)
Hemoglobin: 14 g/dL (ref 13.0–17.0)
Immature Granulocytes: 0 %
Lymphocytes Relative: 44 %
Lymphs Abs: 1.2 K/uL (ref 0.7–4.0)
MCH: 31.5 pg (ref 26.0–34.0)
MCHC: 34.4 g/dL (ref 30.0–36.0)
MCV: 91.5 fL (ref 80.0–100.0)
Monocytes Absolute: 0.3 K/uL (ref 0.1–1.0)
Monocytes Relative: 12 %
Neutro Abs: 1.1 K/uL — ABNORMAL LOW (ref 1.7–7.7)
Neutrophils Relative %: 41 %
Platelet Count: 98 K/uL — ABNORMAL LOW (ref 150–400)
RBC: 4.45 MIL/uL (ref 4.22–5.81)
RDW: 16.5 % — ABNORMAL HIGH (ref 11.5–15.5)
WBC Count: 2.7 K/uL — ABNORMAL LOW (ref 4.0–10.5)
nRBC: 0 % (ref 0.0–0.2)

## 2024-07-04 LAB — CEA (ACCESS): CEA (CHCC): 29.8 ng/mL — ABNORMAL HIGH (ref 0.00–5.00)

## 2024-07-04 LAB — TOTAL PROTEIN, URINE DIPSTICK

## 2024-07-04 LAB — FERRITIN: Ferritin: 668 ng/mL — ABNORMAL HIGH (ref 24–336)

## 2024-07-04 MED ORDER — PANTOPRAZOLE SODIUM 40 MG PO TBEC
40.0000 mg | DELAYED_RELEASE_TABLET | Freq: Two times a day (BID) | ORAL | 4 refills | Status: AC
  Filled 2024-07-04: qty 60, 30d supply, fill #0

## 2024-07-04 NOTE — Progress Notes (Signed)
 Hematology and Oncology Follow Up Visit  Richard Bowman [id number] [birth year] 48 y.o. 07/04/2024   Principle Diagnosis:  Metastatic adenocarcinoma of the colon-liver metastasis -- pMMR/MSI-low -- (+) KRAS/ ERBB2(+) Iron deficiency anemia Superficial thrombus of the right forearm   Current Therapy:        FOLFOXIRI - s/p cycle #11  - start on  -- 12/09/2022 --oxaliplatin  dropped after 04/19/2023 IV iron as indicated Xeloda /Avastin  -- s/p cycle #1 on 09/13/2023 - Avastin  on hold - Xeloda  d/c'ed on 10/15/2023 Enhertu  - s/p cycle #2 - start on 11/26/2023 -DC on 04/25/2024 Intrahepatic Y-90 -first dose on 12/23/2023 Xarelto  10 mg p.o. daily-started on 12/19/2023 Avastin /Lonsurf  80 mg po BID (d1-5;d 8-12) - started 05/02/2024, s/p cycle 2   Interim History:  Richard Bowman is here today for follow-up and treatment. He is having a hard time with n/v with or without food. He has been taking Zofran  but this has not fully relieved his symptoms. He is unsure if he has Zyprexa  at home and has not been taking.  His weight is down 8 lbs since his last visit a few weeks ago. He also noted right upper quadrant pain. Exam of abdomen did not show organomegaly but he was tender in the right upper quadrant.  He is supplementing with protein shakes 2-3 times a day.  He has been off Lonsurf  for over 2 weeks total now. He missed his office visit and treatment last week so he did not start this week.  CEA last visit was 25 (previously 33).  He has had persistent headaches as well. No vision changes or loss.  No fever, chills, cough, rash, chest pain, palpitations or changes in bladder habits.  He has a BM every 2-3 days.  So far he has not noted any blood loss. No bruising or petechiae.  No swelling, tenderness, numbness or tingling in his extremities.  No falls or syncope reported.   ECOG Performance Status: 1 - Symptomatic but completely ambulatory  Medications:  Allergies as of 07/04/2024       Reactions    Xeloda  [capecitabine ] Nausea And Vomiting, Other (See Comments)   Chest pain        Medication List        Accurate as of July 04, 2024  1:35 PM. If you have any questions, ask your nurse or doctor.          acetaminophen  500 MG tablet Commonly known as: TYLENOL  Take 2 tablets (1,000 mg total) by mouth every 6 (six) hours as needed for mild pain.   docusate sodium  100 MG capsule Commonly known as: COLACE Take 1 capsule (100 mg total) by mouth 2 (two) times daily.   dronabinol  5 MG capsule Commonly known as: MARINOL  Take 1 capsule (5 mg total) by mouth 2 (two) times daily before a meal.   gabapentin  300 MG capsule Commonly known as: NEURONTIN  Take 1 capsule (300 mg total) by mouth 3 (three) times daily.   lactose free nutrition Liqd Take 237 mLs by mouth daily as needed (Supplement).   lactulose  10 GM/15ML solution Commonly known as: CHRONULAC  Take 15 mLs (10 g total) by mouth 3 (three) times daily.   lidocaine -prilocaine  cream Commonly known as: EMLA  Apply 1 Application topically as needed.   Lonsurf  20-8.19 MG tablet Generic drug: trifluridine -tipiracil  Take 4 tablets (80 mg of trifluridine  total) by mouth 2 (two) times daily after a meal. Take within 1 hr after AM & PM meals on days 1-5, 8-12. Repeat every  28 days.   LORazepam  0.5 MG tablet Commonly known as: ATIVAN  Place 1 tablet (0.5 mg total) under the tongue every 6 (six) hours as needed for anxiety.   megestrol  400 MG/10ML suspension Commonly known as: MEGACE  Take 10 mLs (400 mg total) by mouth daily.   methocarbamol  500 MG tablet Commonly known as: ROBAXIN  Take 1 tablet (500 mg total) by mouth every 6 (six) hours as needed for muscle spasms.   metoCLOPramide  10 MG tablet Commonly known as: REGLAN  Take 1 tablet (10 mg total) by mouth every 6 (six) hours.   nystatin  100000 UNIT/ML suspension Commonly known as: MYCOSTATIN  Take 5 mLs (500,000 Units total) by mouth 4 (four) times daily.    OLANZapine  10 MG tablet Commonly known as: ZYPREXA  Take 1 tablet (10 mg total) by mouth at bedtime.   ondansetron  4 MG disintegrating tablet Commonly known as: ZOFRAN -ODT Take 1 tablet (4 mg total) by mouth every 8 (eight) hours as needed for nausea or vomiting.   oxyCODONE  5 MG immediate release tablet Commonly known as: Oxy IR/ROXICODONE  Take 1 tablet (5 mg total) by mouth every 4 (four) hours as needed for severe pain (pain score 7-10).   pantoprazole  40 MG tablet Commonly known as: Protonix  Take 1 tablet (40 mg total) by mouth 2 (two) times daily.   polyethylene glycol 17 g packet Commonly known as: MIRALAX  / GLYCOLAX  Take 17 g by mouth daily as needed for mild constipation.        Allergies:  Allergies  Allergen Reactions   Xeloda  [Capecitabine ] Nausea And Vomiting and Other (See Comments)    Chest pain    Past Medical History, Surgical history, Social history, and Family History were reviewed and updated.  Review of Systems: All other 10 point review of systems is negative.   Physical Exam:  height is 5' 10 (1.778 m) and weight is 186 lb (84.4 kg). His oral temperature is 98 F (36.7 C). His blood pressure is 113/75 and his pulse is 59 (abnormal). His respiration is 18 and oxygen saturation is 97%.   Wt Readings from Last 3 Encounters:  07/04/24 186 lb (84.4 kg)  06/05/24 194 lb 6.4 oz (88.2 kg)  05/10/24 200 lb 12.8 oz (91.1 kg)    Ocular: Sclerae unicteric, pupils equal, round and reactive to light Ear-nose-throat: Oropharynx clear, dentition fair Lymphatic: No cervical or supraclavicular adenopathy Lungs no rales or rhonchi, good excursion bilaterally Heart regular rate and rhythm, no murmur appreciated Abd soft, nontender, positive bowel sounds MSK no focal spinal tenderness, no joint edema Neuro: non-focal, well-oriented, appropriate affect Breasts: Deferred   Lab Results  Component Value Date   WBC 4.0 06/05/2024   HGB 14.2 06/05/2024   HCT  41.7 06/05/2024   MCV 89.5 06/05/2024   PLT 80 (L) 06/05/2024   Lab Results  Component Value Date   FERRITIN 385 (H) 06/05/2024   IRON 69 06/05/2024   TIBC 295 06/05/2024   UIBC 226 06/05/2024   IRONPCTSAT 23 06/05/2024   Lab Results  Component Value Date   RETICCTPCT 0.7 04/25/2024   RBC 4.66 06/05/2024   No results found for: KPAFRELGTCHN, LAMBDASER, KAPLAMBRATIO No results found for: IGGSERUM, IGA, IGMSERUM No results found for: STEPHANY CARLOTA BENSON MARKEL EARLA JOANNIE DOC VICK, SPEI   Chemistry      Component Value Date/Time   NA 141 06/05/2024 0927   K 3.6 06/05/2024 0927   CL 103 06/05/2024 0927   CO2 26 06/05/2024 0927   BUN 15 06/05/2024  9072   CREATININE 1.01 06/05/2024 0927      Component Value Date/Time   CALCIUM  9.5 06/05/2024 0927   ALKPHOS 419 (H) 06/05/2024 0927   AST 68 (H) 06/05/2024 0927   ALT 74 (H) 06/05/2024 0927   BILITOT 0.6 06/05/2024 9072       Impression and Plan: Richard Bowman is a very nice 48 yo caucasian gentleman with metastatic colon cancer. He had progression on Enhertu .  Currently, he is on Lonsurf /Avastin .  He will continue to Hold Lonsurf  and restart next week on Monday.  CEA pending.  No Avastin  today per Dr. Timmy.  He will check at home for Zyprexa  and if he does not have we will refill.  Zofran  refilled.  He will also start Protonix  40 mg PO BID.  Iron studies pending. We will replace if needed.  We will an MRI of the brain this week to assess for progression as cause of headaches.  We will repeat CT scans in 3 weeks.  Follow-up with MD to discuss results and treatment plan in 4 weeks.   Lauraine Pepper, NP 11/4/20251:35 PM  ADDENDUM: I saw Richard Bowman.  The weight loss really bothers me.  To me, I just had a feeling that he might be progressing.  He is not eating much.  He is having some pain.  He does not have much appetite.  I think he is smoking some marijuana.  We really  cannot get him the Marinol  because of backorder from his company.  He would definitely his have a CT scan done.  His CEA has been drifting up a little bit.  Today, the CEA was about 30.  About 3 weeks ago, it was 25.  Again, the weight loss really, really bothers me.  We had a long talk with him and try to go over his medications.  Again is not clear as to what he is taking what he is not taking.  He is having nausea and vomiting.  Again he should be taking the Zyprexa .  He ran out of Zofran .  We need to have him on a antiacid.  Again, we will hold his Avastin  today.  I just do not think we need to give him anything today given the other issues that he is having.  I really believe that the CAT scan is going to be critical.  We will see back in the set up in about 3 weeks.  Jeralyn Timmy, MD

## 2024-07-04 NOTE — Progress Notes (Unsigned)
 Patient was a no-show last week, and as such his oral chemo is off schedule. He will restart on Monday. Patient is due for restaging. He needs an MRI of the brain and CTs.   Patient scheduled MRI for 07/10/2024 however refuses to schedule his CT until he can see Dr Timmy again on 08/03/2024. Notified Lauraine Pepper NP, ordering provider.   Oncology Nurse Navigator Documentation     07/04/2024    2:30 PM  Oncology Nurse Navigator Flowsheets  Navigator Follow Up Date: 07/10/2024  Navigator Follow Up Reason: Scan Review  Navigator Location [facility]  Navigator Encounter Type Follow-up Appt  Patient Visit Type MedOnc  Treatment Phase Active Tx  Barriers/Navigation Needs No Barriers At This Time  Interventions Coordination of Care;Psycho-Social Support  Acuity Level 1-No Barriers  Time Spent with Patient 15

## 2024-07-05 ENCOUNTER — Encounter: Payer: Self-pay | Admitting: Hematology & Oncology

## 2024-07-05 ENCOUNTER — Other Ambulatory Visit: Payer: Self-pay

## 2024-07-05 NOTE — Addendum Note (Signed)
 Addended by: TIMMY COY R on: 07/05/2024 07:09 AM   Modules accepted: Level of Service

## 2024-07-06 ENCOUNTER — Other Ambulatory Visit: Payer: Self-pay | Admitting: Hematology & Oncology

## 2024-07-06 ENCOUNTER — Other Ambulatory Visit (HOSPITAL_BASED_OUTPATIENT_CLINIC_OR_DEPARTMENT_OTHER): Payer: Self-pay

## 2024-07-06 DIAGNOSIS — C189 Malignant neoplasm of colon, unspecified: Secondary | ICD-10-CM

## 2024-07-06 MED ORDER — OXYCODONE HCL 5 MG PO TABS
5.0000 mg | ORAL_TABLET | ORAL | 0 refills | Status: DC | PRN
  Filled 2024-07-06: qty 60, 10d supply, fill #0

## 2024-07-10 ENCOUNTER — Ambulatory Visit (HOSPITAL_COMMUNITY)
Admission: RE | Admit: 2024-07-10 | Discharge: 2024-07-10 | Disposition: A | Discharge status: Home / Self Care | Source: Ambulatory Visit | Attending: Family | Admitting: Family

## 2024-07-10 ENCOUNTER — Other Ambulatory Visit (HOSPITAL_COMMUNITY)

## 2024-07-10 ENCOUNTER — Other Ambulatory Visit: Payer: Self-pay

## 2024-07-10 DIAGNOSIS — C787 Secondary malignant neoplasm of liver and intrahepatic bile duct: Secondary | ICD-10-CM | POA: Diagnosis present

## 2024-07-10 DIAGNOSIS — R519 Headache, unspecified: Secondary | ICD-10-CM | POA: Diagnosis present

## 2024-07-10 DIAGNOSIS — C189 Malignant neoplasm of colon, unspecified: Secondary | ICD-10-CM | POA: Diagnosis present

## 2024-07-10 MED ORDER — GADOBUTROL 1 MMOL/ML IV SOLN
8.4000 mL | Freq: Once | INTRAVENOUS | Status: AC | PRN
  Administered 2024-07-10: 8.4 mL via INTRAVENOUS

## 2024-07-10 NOTE — Progress Notes (Signed)
 Specialty Pharmacy Refill Coordination Note  Richard Bowman is a 48 y.o. male contacted today regarding refills of specialty medication(s) Trifluridine -Tipiracil  (Lonsurf )   Patient requested [name] at [facility] [place] date: 07/12/24   Medication will be filled on: 07/11/24

## 2024-07-11 ENCOUNTER — Other Ambulatory Visit: Payer: Self-pay

## 2024-07-12 ENCOUNTER — Ambulatory Visit: Admitting: Hematology & Oncology

## 2024-07-12 ENCOUNTER — Ambulatory Visit

## 2024-07-12 ENCOUNTER — Inpatient Hospital Stay

## 2024-07-14 ENCOUNTER — Encounter: Payer: Self-pay | Admitting: *Deleted

## 2024-07-14 NOTE — Progress Notes (Signed)
 Reviewed PET which is negative for mets.   Oncology Nurse Navigator Documentation     07/14/2024    3:00 PM  Oncology Nurse Navigator Flowsheets  Navigator Follow Up Date: 08/03/2024  Navigator Follow Up Reason: Follow-up Appointment;Chemotherapy  Navigator Location [facility]  Navigator Encounter Type Scan Review  Patient Visit Type MedOnc  Treatment Phase Active Tx  Barriers/Navigation Needs No Barriers At This Time  Interventions None Required  Acuity Level 1-No Barriers  Time Spent with Patient 15

## 2024-07-17 ENCOUNTER — Inpatient Hospital Stay

## 2024-07-17 ENCOUNTER — Ambulatory Visit: Admitting: Hematology & Oncology

## 2024-07-17 ENCOUNTER — Other Ambulatory Visit: Payer: Self-pay

## 2024-07-17 ENCOUNTER — Ambulatory Visit: Payer: Self-pay | Admitting: Hematology & Oncology

## 2024-07-17 ENCOUNTER — Other Ambulatory Visit (HOSPITAL_BASED_OUTPATIENT_CLINIC_OR_DEPARTMENT_OTHER): Payer: Self-pay

## 2024-07-18 ENCOUNTER — Other Ambulatory Visit: Payer: Self-pay

## 2024-07-20 ENCOUNTER — Other Ambulatory Visit: Payer: Self-pay | Admitting: Hematology & Oncology

## 2024-07-20 ENCOUNTER — Other Ambulatory Visit (HOSPITAL_BASED_OUTPATIENT_CLINIC_OR_DEPARTMENT_OTHER): Payer: Self-pay

## 2024-07-20 DIAGNOSIS — C787 Secondary malignant neoplasm of liver and intrahepatic bile duct: Secondary | ICD-10-CM

## 2024-07-20 MED ORDER — OXYCODONE HCL 5 MG PO TABS
5.0000 mg | ORAL_TABLET | ORAL | 0 refills | Status: DC | PRN
  Filled 2024-07-20: qty 60, 10d supply, fill #0

## 2024-07-25 ENCOUNTER — Inpatient Hospital Stay

## 2024-07-25 ENCOUNTER — Ambulatory Visit (HOSPITAL_BASED_OUTPATIENT_CLINIC_OR_DEPARTMENT_OTHER)
Admission: RE | Admit: 2024-07-25 | Discharge: 2024-07-25 | Disposition: A | Discharge status: Home / Self Care | Source: Ambulatory Visit | Attending: Family | Admitting: Family

## 2024-07-25 ENCOUNTER — Encounter (HOSPITAL_BASED_OUTPATIENT_CLINIC_OR_DEPARTMENT_OTHER): Payer: Self-pay

## 2024-07-25 ENCOUNTER — Inpatient Hospital Stay: Admitting: Hematology & Oncology

## 2024-07-25 DIAGNOSIS — C189 Malignant neoplasm of colon, unspecified: Secondary | ICD-10-CM | POA: Diagnosis present

## 2024-07-25 DIAGNOSIS — C787 Secondary malignant neoplasm of liver and intrahepatic bile duct: Secondary | ICD-10-CM | POA: Diagnosis present

## 2024-07-25 MED ORDER — IOHEXOL 300 MG/ML  SOLN
100.0000 mL | Freq: Once | INTRAMUSCULAR | Status: AC | PRN
  Administered 2024-07-25: 100 mL via INTRAVENOUS

## 2024-07-26 ENCOUNTER — Other Ambulatory Visit: Payer: Self-pay

## 2024-08-01 ENCOUNTER — Other Ambulatory Visit: Payer: Self-pay

## 2024-08-02 ENCOUNTER — Other Ambulatory Visit: Payer: Self-pay

## 2024-08-02 ENCOUNTER — Other Ambulatory Visit: Payer: Self-pay | Admitting: Hematology & Oncology

## 2024-08-02 ENCOUNTER — Other Ambulatory Visit (HOSPITAL_BASED_OUTPATIENT_CLINIC_OR_DEPARTMENT_OTHER): Payer: Self-pay

## 2024-08-02 DIAGNOSIS — C787 Secondary malignant neoplasm of liver and intrahepatic bile duct: Secondary | ICD-10-CM

## 2024-08-02 MED ORDER — OXYCODONE HCL 5 MG PO TABS
5.0000 mg | ORAL_TABLET | ORAL | 0 refills | Status: DC | PRN
  Filled 2024-08-02: qty 60, 10d supply, fill #0

## 2024-08-03 ENCOUNTER — Encounter: Payer: Self-pay | Admitting: *Deleted

## 2024-08-03 ENCOUNTER — Inpatient Hospital Stay: Admitting: Hematology & Oncology

## 2024-08-03 ENCOUNTER — Inpatient Hospital Stay

## 2024-08-03 ENCOUNTER — Inpatient Hospital Stay: Attending: Hematology & Oncology

## 2024-08-03 ENCOUNTER — Encounter: Payer: Self-pay | Admitting: Hematology & Oncology

## 2024-08-03 ENCOUNTER — Other Ambulatory Visit: Payer: Self-pay

## 2024-08-03 VITALS — BP 124/53 | HR 67 | Temp 98.1°F | Resp 17 | Ht 70.0 in | Wt 179.0 lb

## 2024-08-03 DIAGNOSIS — C189 Malignant neoplasm of colon, unspecified: Secondary | ICD-10-CM | POA: Insufficient documentation

## 2024-08-03 DIAGNOSIS — C787 Secondary malignant neoplasm of liver and intrahepatic bile duct: Secondary | ICD-10-CM | POA: Diagnosis present

## 2024-08-03 DIAGNOSIS — D509 Iron deficiency anemia, unspecified: Secondary | ICD-10-CM | POA: Insufficient documentation

## 2024-08-03 DIAGNOSIS — R112 Nausea with vomiting, unspecified: Secondary | ICD-10-CM

## 2024-08-03 DIAGNOSIS — R519 Headache, unspecified: Secondary | ICD-10-CM

## 2024-08-03 LAB — CMP (CANCER CENTER ONLY)
ALT: 89 U/L — ABNORMAL HIGH (ref 0–44)
AST: 79 U/L — ABNORMAL HIGH (ref 15–41)
Albumin: 4.4 g/dL (ref 3.5–5.0)
Alkaline Phosphatase: 344 U/L — ABNORMAL HIGH (ref 38–126)
Anion gap: 11 (ref 5–15)
BUN: 13 mg/dL (ref 6–20)
CO2: 28 mmol/L (ref 22–32)
Calcium: 10.3 mg/dL (ref 8.9–10.3)
Chloride: 103 mmol/L (ref 98–111)
Creatinine: 0.91 mg/dL (ref 0.61–1.24)
GFR, Estimated: >60 mL/min (ref >60)
Glucose, Bld: 98 mg/dL (ref 70–99)
Potassium: 4.2 mmol/L (ref 3.5–5.1)
Sodium: 141 mmol/L (ref 135–145)
Total Bilirubin: 1.1 mg/dL (ref 0.0–1.2)
Total Protein: 7.6 g/dL (ref 6.5–8.1)

## 2024-08-03 LAB — CBC WITH DIFFERENTIAL (CANCER CENTER ONLY)
Abs Immature Granulocytes: 0.01 K/uL (ref 0.00–0.07)
Basophils Absolute: 0 K/uL (ref 0.0–0.1)
Basophils Relative: 0 %
Eosinophils Absolute: 0.2 K/uL (ref 0.0–0.5)
Eosinophils Relative: 5 %
HCT: 42.7 % (ref 39.0–52.0)
Hemoglobin: 14.8 g/dL (ref 13.0–17.0)
Immature Granulocytes: 0 %
Lymphocytes Relative: 24 %
Lymphs Abs: 1.2 K/uL (ref 0.7–4.0)
MCH: 31.7 pg (ref 26.0–34.0)
MCHC: 34.7 g/dL (ref 30.0–36.0)
MCV: 91.4 fL (ref 80.0–100.0)
Monocytes Absolute: 0.5 K/uL (ref 0.1–1.0)
Monocytes Relative: 9 %
Neutro Abs: 3.1 K/uL (ref 1.7–7.7)
Neutrophils Relative %: 62 %
Platelet Count: 93 K/uL — ABNORMAL LOW (ref 150–400)
RBC: 4.67 MIL/uL (ref 4.22–5.81)
RDW: 14.4 % (ref 11.5–15.5)
WBC Count: 5.1 K/uL (ref 4.0–10.5)
nRBC: 0 % (ref 0.0–0.2)

## 2024-08-03 LAB — IRON AND IRON BINDING CAPACITY (CC-WL,HP ONLY)
Iron: 110 ug/dL (ref 45–182)
Saturation Ratios: 35 % (ref 17.9–39.5)
TIBC: 316 ug/dL (ref 250–450)
UIBC: 206 ug/dL

## 2024-08-03 LAB — FERRITIN: Ferritin: 772 ng/mL — ABNORMAL HIGH (ref 24–336)

## 2024-08-03 LAB — CEA (ACCESS): CEA (CHCC): 52.76 ng/mL — ABNORMAL HIGH (ref 0.00–5.00)

## 2024-08-03 LAB — LACTATE DEHYDROGENASE: LDH: 113 U/L (ref 105–235)

## 2024-08-03 NOTE — Progress Notes (Signed)
 Hematology and Oncology Follow Up Visit  Richard Bowman [id number] 1976/06/27 48 y.o. 08/03/2024   Principle Diagnosis:  Metastatic adenocarcinoma of the colon-liver metastasis -- pMMR/MSI-low -- (+) KRAS/ ERBB2(+) Iron deficiency anemia Superficial thrombus of the right forearm   Current Therapy:        FOLFOXIRI - s/p cycle #11  - start on  -- 12/09/2022 --oxaliplatin  dropped after 04/19/2023 IV iron as indicated Xeloda /Avastin  -- s/p cycle #1 on 09/13/2023 - Avastin  on hold - Xeloda  d/c'ed on 10/15/2023 Enhertu  - s/p cycle #2 - start on 11/26/2023 -DC on 04/25/2024 Intrahepatic Y-90 -first dose on 12/23/2023 Xarelto  10 mg p.o. daily-started on 12/19/2023 Avastin /Lonsurf  80 mg po BID (d1-5;d 8-12) - started 05/02/2024, s/p cycle #3 - d/c on 08/03/2024   Interim History:  Richard Bowman is here today for follow-up.  Unfortunately, and I think that we are having problems with his tumor now.  He had a CT scan that was done recently.  The CT scan showed that he seemed to have disease progression.  He had soft tissue nodularity along the inferior margin of the right lobe of the liver.  Again this was concerning for peritoneal carcinomatosis.  He certainly has had symptoms that I would think would be as consistent with peritoneal carcinomatosis.  He gets full pretty quickly.  He has had nausea.  Again I have no clue what medicines he is actually taking.  He has no idea what medicines he actually is taking.  We went through all of his medications and DC'd once had he thinks he is not on.  I think at this point, we will going to have to think about a biopsy.  We have not had a biopsy since his initial diagnosis.  I think that a biopsy along with molecular analysis will certainly help us .  I think that where the point we are going to have to consider him for a potential Clinical Trial.  I would probably would send him to [facility], [place].  However, they would want to have fresh tumor and a up-to-date  molecular analysis.  Again, he is aware of what is going on.  He is certainly has a very good approach.  I know that he has done incredibly well.  He has been dealing with this now for a year and a half.  He has had no obvious bleeding.  He has had no leg swelling.  He has had no rashes.  There is been no fever.  He has had no cough.  Overall, I will say that his performance status is probably ECOG 1.     Medications:  Allergies as of 08/03/2024       Reactions   Xeloda  [capecitabine ] Nausea And Vomiting, Other (See Comments)   Chest pain        Medication List        Accurate as of August 03, 2024  9:14 AM. If you have any questions, ask your nurse or doctor.          acetaminophen  500 MG tablet Commonly known as: TYLENOL  Take 2 tablets (1,000 mg total) by mouth every 6 (six) hours as needed for mild pain.   docusate sodium  100 MG capsule Commonly known as: COLACE Take 1 capsule (100 mg total) by mouth 2 (two) times daily.   dronabinol  5 MG capsule Commonly known as: MARINOL  Take 1 capsule (5 mg total) by mouth 2 (two) times daily before a meal.   gabapentin  300 MG capsule Commonly  known as: NEURONTIN  Take 1 capsule (300 mg total) by mouth 3 (three) times daily.   lactose free nutrition Liqd Take 237 mLs by mouth daily as needed (Supplement).   lactulose  10 GM/15ML solution Commonly known as: CHRONULAC  Take 15 mLs (10 g total) by mouth 3 (three) times daily.   lidocaine -prilocaine  cream Commonly known as: EMLA  Apply 1 Application topically as needed.   Lonsurf  20-8.19 MG tablet Generic drug: trifluridine -tipiracil  Take 4 tablets (80 mg of trifluridine  total) by mouth 2 (two) times daily after a meal. Take within 1 hr after AM & PM meals on days 1-5, 8-12. Repeat every 28 days.   LORazepam  0.5 MG tablet Commonly known as: ATIVAN  Place 1 tablet (0.5 mg total) under the tongue every 6 (six) hours as needed for anxiety.   megestrol  400 MG/10ML  suspension Commonly known as: MEGACE  Take 10 mLs (400 mg total) by mouth daily.   methocarbamol  500 MG tablet Commonly known as: ROBAXIN  Take 1 tablet (500 mg total) by mouth every 6 (six) hours as needed for muscle spasms.   metoCLOPramide  10 MG tablet Commonly known as: REGLAN  Take 1 tablet (10 mg total) by mouth every 6 (six) hours.   nystatin  100000 UNIT/ML suspension Commonly known as: MYCOSTATIN  Take 5 mLs (500,000 Units total) by mouth 4 (four) times daily.   OLANZapine  10 MG tablet Commonly known as: ZYPREXA  Take 1 tablet (10 mg total) by mouth at bedtime.   ondansetron  4 MG disintegrating tablet Commonly known as: ZOFRAN -ODT Take 1 tablet (4 mg total) by mouth every 8 (eight) hours as needed for nausea or vomiting.   oxyCODONE  5 MG immediate release tablet Commonly known as: Oxy IR/ROXICODONE  Take 1 tablet (5 mg total) by mouth every 4 (four) hours as needed for severe pain (pain score 7-10).   pantoprazole  40 MG tablet Commonly known as: Protonix  Take 1 tablet (40 mg total) by mouth 2 (two) times daily.   polyethylene glycol 17 g packet Commonly known as: MIRALAX  / GLYCOLAX  Take 17 g by mouth daily as needed for mild constipation.        Allergies:  Allergies  Allergen Reactions   Xeloda  [Capecitabine ] Nausea And Vomiting and Other (See Comments)    Chest pain    Past Medical History, Surgical history, Social history, and Family History were reviewed and updated.  Review of Systems: Review of Systems  Constitutional:  Positive for weight loss.  HENT: Negative.    Eyes: Negative.   Respiratory: Negative.    Cardiovascular: Negative.   Gastrointestinal:  Positive for abdominal pain and nausea.  Genitourinary: Negative.   Musculoskeletal:  Positive for back pain and myalgias.  Skin: Negative.   Neurological: Negative.   Endo/Heme/Allergies: Negative.   Psychiatric/Behavioral: Negative.       Physical Exam:  height is 5' 10 (1.778 m) and  weight is 179 lb (81.2 kg). His oral temperature is 98.1 F (36.7 C). His blood pressure is 124/53 (abnormal) and his pulse is 67. His respiration is 17 and oxygen saturation is 100%.   Wt Readings from Last 3 Encounters:  08/03/24 179 lb (81.2 kg)  07/04/24 186 lb (84.4 kg)  06/05/24 194 lb 6.4 oz (88.2 kg)   Physical Exam Vitals reviewed.  HENT:     Head: Normocephalic and atraumatic.  Eyes:     Pupils: Pupils are equal, round, and reactive to light.  Cardiovascular:     Rate and Rhythm: Normal rate and regular rhythm.     Heart sounds:  Normal heart sounds.  Pulmonary:     Effort: Pulmonary effort is normal.     Breath sounds: Normal breath sounds.  Abdominal:     General: Bowel sounds are normal.     Palpations: Abdomen is soft.  Musculoskeletal:        General: No tenderness or deformity. Normal range of motion.     Cervical back: Normal range of motion.  Lymphadenopathy:     Cervical: No cervical adenopathy.  Skin:    General: Skin is warm and dry.     Findings: No erythema or rash.  Neurological:     Mental Status: He is alert and oriented to person, place, and time.  Psychiatric:        Behavior: Behavior normal.        Thought Content: Thought content normal.        Judgment: Judgment normal.      Lab Results  Component Value Date   WBC 5.1 08/03/2024   HGB 14.8 08/03/2024   HCT 42.7 08/03/2024   MCV 91.4 08/03/2024   PLT 93 (L) 08/03/2024   Lab Results  Component Value Date   FERRITIN 668 (H) 07/04/2024   IRON 88 07/04/2024   TIBC 301 07/04/2024   UIBC 213 07/04/2024   IRONPCTSAT 29 07/04/2024   Lab Results  Component Value Date   RETICCTPCT 0.7 04/25/2024   RBC 4.67 08/03/2024   No results found for: KPAFRELGTCHN, LAMBDASER, KAPLAMBRATIO No results found for: IGGSERUM, IGA, IGMSERUM No results found for: STEPHANY CARLOTA BENSON MARKEL EARLA JOANNIE DOC VICK, SPEI   Chemistry      Component Value  Date/Time   NA 141 08/03/2024 0829   K 4.2 08/03/2024 0829   CL 103 08/03/2024 0829   CO2 28 08/03/2024 0829   BUN 13 08/03/2024 0829   CREATININE 0.91 08/03/2024 0829      Component Value Date/Time   CALCIUM  10.3 08/03/2024 0829   ALKPHOS 344 (H) 08/03/2024 0829   AST 79 (H) 08/03/2024 0829   ALT 89 (H) 08/03/2024 0829   BILITOT 1.1 08/03/2024 0829       Impression and Plan: Mr. Sapia is a very nice 48 yo caucasian gentleman with metastatic colon cancer. He had progression on Enhertu .  Currently, he is on Lonsurf /Avastin .   I will go ahead and stop the Lonsurf /Avastin .  Again, where the point where are going to  have to think about a Clinical Trial if he does qualify.  We have to get a biopsy.  I will see if this can be done.  We will hopefully be able to get molecular studies.  Again, we are holding treatment on him.  I know that he is done everything we have wanted him to do.  He is a has done a great job.  Had to give him a lot of credit for being so diligent.  Again, he understands but he is up against.  He understands that his cancer is progressing.  His last CEA level was 30.  We will see what today's level is.  I will plan to get him back probably in about a month or so.  Hopefully, by then we will have had results back from a biopsy and, more portly, the molecular studies.

## 2024-08-03 NOTE — Progress Notes (Signed)
 Patient here for consideration of next cycle. Recent scan shows slight progression. Current treatment discontinued. He will need a new biopsy with hopeful molecular testing. CT biopsy scheduled for 09/01/2024  Oncology Nurse Navigator Documentation     08/03/2024    8:30 AM  Oncology Nurse Navigator Flowsheets  Navigator Follow Up Date: 09/01/2024  Navigator Follow Up Reason: Other:  Navigator Location [facility]  Navigator Encounter Type Follow-up Appt  Patient Visit Type MedOnc  Treatment Phase Active Tx  Barriers/Navigation Needs No Barriers At This Time  Interventions Psycho-Social Support  Acuity Level 1-No Barriers  Time Spent with Patient 15

## 2024-08-03 NOTE — Progress Notes (Unsigned)
 Hughes Simmonds, MD  Baldwin Rosella L PROCEDURE / BIOPSY REVIEW Date: 08/03/24  Requested Biopsy site: Liver Reason for request: Masses. Hx met colon CA Imaging review: Best seen on CT  Decision: Approved Imaging modality to perform: Ultrasound Schedule with: Moderate Sedation Schedule for: Any VIR  Additional comments: @Schedulers . US  Liver Mass Bx. Mod sed.  Please contact me with questions, concerns, or if issue pertaining to this request arise.  Simmonds Hughes, MD [facility] Radiology       Previous Messages    ----- Message ----- From: Baldwin Rosella CROME Sent: 08/03/2024   1:04 PM EST To: Rosella CROME Baldwin; Taryn F Rigney, RT; Ir Proc* Subject: CT LIVER MASS BIOPSY                          Procedure :CT LIVER MASS BIOPSY  Reason :Progressive colon cancer.  I need a fresh specimen for molecular studies.  Thank you so much.  Jeralyn Dx: Colon cancer metastasized to liver (HCC) [C18.9, C78.7 (ICD-10-CM)]    History :CT CHEST ABDOMEN PELVIS W CONTRAST,MR Brain W Wo Contrast,CT CHEST ABDOMEN PELVIS W CONTRAST,IR Radiologist Eval & Mgmt  Provider:Ennever, Maude SAUNDERS, MD  Provider contact ;  [phone]

## 2024-08-04 ENCOUNTER — Other Ambulatory Visit: Payer: Self-pay

## 2024-08-04 NOTE — Progress Notes (Signed)
 Per visit on 08/03/24, patient's Lonsurf  was stopped due to progression. Dis-enrolled.

## 2024-08-10 ENCOUNTER — Encounter: Payer: Self-pay | Admitting: Hematology & Oncology

## 2024-08-14 ENCOUNTER — Telehealth: Payer: Self-pay | Admitting: *Deleted

## 2024-08-14 ENCOUNTER — Other Ambulatory Visit (HOSPITAL_BASED_OUTPATIENT_CLINIC_OR_DEPARTMENT_OTHER): Payer: Self-pay

## 2024-08-14 ENCOUNTER — Other Ambulatory Visit: Payer: Self-pay | Admitting: *Deleted

## 2024-08-14 DIAGNOSIS — C189 Malignant neoplasm of colon, unspecified: Secondary | ICD-10-CM

## 2024-08-14 MED ORDER — OXYCODONE HCL 10 MG PO TABS
10.0000 mg | ORAL_TABLET | Freq: Four times a day (QID) | ORAL | 0 refills | Status: DC | PRN
  Filled 2024-08-14: qty 90, 23d supply, fill #0

## 2024-08-14 NOTE — Telephone Encounter (Signed)
 Patient called to tell us  that he is having more pain on his right side of chest and right arm.  Has gotten substantially worse in last few days.  Dr Timmy notified.  Increased patients pain medicine from Oxycodone  5 mg every 4 to Oxycodone  10 every 6 hours.  Called patient to let him know.  LMAM.

## 2024-08-28 ENCOUNTER — Telehealth: Payer: Self-pay

## 2024-08-28 ENCOUNTER — Other Ambulatory Visit: Payer: Self-pay

## 2024-08-28 DIAGNOSIS — Z01818 Encounter for other preprocedural examination: Secondary | ICD-10-CM

## 2024-08-28 NOTE — Telephone Encounter (Signed)
 Received phone call from patient with multiple questions. Pt inquiring if the biopsy will make his cancer spread more throughout his body. Pt educated that the biopsy will not make his cancer spread more. Pt stating that his pain is still continuing to be painful and the pain medications are only helping some. Pt educated that his pain is probably from his cancer progression and with the biopsy results hopefully we can start a treatment to help with pain.  Pt continues to complain of nausea stating that at times my body rejects water pt states smells and taste contribute to his nausea. Pt educated that he should continue to take anti nausea medications as prescribed to help with that. Pt educated on ginger chews and cinnamon gum which can help with nausea. Pt encouraged to eat small meals and to sip water not chug pt states that he does all these things but still continues to have nausea. Pt states that he has to cook meals at home to help control the taste and smell. Pt encouraged to try to eat crackers on an empty stomach before getting out of bed.  Dr. Timmy aware of patient complaints. No new orders received. Pt educated that he can get IV fluids if needed. Pt verbalized understanding and had no further needs identified.

## 2024-08-29 ENCOUNTER — Other Ambulatory Visit: Payer: Self-pay

## 2024-08-29 DIAGNOSIS — C189 Malignant neoplasm of colon, unspecified: Secondary | ICD-10-CM

## 2024-08-30 ENCOUNTER — Other Ambulatory Visit: Payer: Self-pay | Admitting: Radiology

## 2024-09-01 ENCOUNTER — Other Ambulatory Visit: Payer: Self-pay

## 2024-09-01 ENCOUNTER — Ambulatory Visit (HOSPITAL_COMMUNITY)
Admission: RE | Admit: 2024-09-01 | Discharge: 2024-09-01 | Disposition: A | Discharge status: Home / Self Care | Source: Ambulatory Visit | Attending: Hematology & Oncology | Admitting: Hematology & Oncology

## 2024-09-01 ENCOUNTER — Other Ambulatory Visit: Payer: Self-pay | Admitting: Hematology & Oncology

## 2024-09-01 ENCOUNTER — Encounter (HOSPITAL_COMMUNITY): Payer: Self-pay

## 2024-09-01 DIAGNOSIS — C787 Secondary malignant neoplasm of liver and intrahepatic bile duct: Secondary | ICD-10-CM

## 2024-09-01 DIAGNOSIS — C189 Malignant neoplasm of colon, unspecified: Secondary | ICD-10-CM | POA: Diagnosis present

## 2024-09-01 DIAGNOSIS — Z01818 Encounter for other preprocedural examination: Secondary | ICD-10-CM | POA: Insufficient documentation

## 2024-09-01 LAB — CBC
HCT: 41.6 % (ref 39.0–52.0)
Hemoglobin: 14.3 g/dL (ref 13.0–17.0)
MCH: 32.2 pg (ref 26.0–34.0)
MCHC: 34.4 g/dL (ref 30.0–36.0)
MCV: 93.7 fL (ref 80.0–100.0)
Platelets: 101 K/uL — ABNORMAL LOW (ref 150–400)
RBC: 4.44 MIL/uL (ref 4.22–5.81)
RDW: 12.9 % (ref 11.5–15.5)
WBC: 5.2 K/uL (ref 4.0–10.5)
nRBC: 0 % (ref 0.0–0.2)

## 2024-09-01 LAB — PROTIME-INR
INR: 1.1 (ref 0.8–1.2)
Prothrombin Time: 14.6 s (ref 11.4–15.2)

## 2024-09-01 MED ORDER — FENTANYL CITRATE (PF) 100 MCG/2ML IJ SOLN
INTRAMUSCULAR | Status: AC
  Filled 2024-09-01: qty 2

## 2024-09-01 MED ORDER — MIDAZOLAM HCL 2 MG/2ML IJ SOLN
INTRAMUSCULAR | Status: AC
  Filled 2024-09-01: qty 2

## 2024-09-01 MED ORDER — SODIUM CHLORIDE 0.9 % IV SOLN: INTRAVENOUS | Status: DC

## 2024-09-01 NOTE — H&P (Signed)
 "     Chief Complaint: Patient was seen in consultation today for perihepatic masses  Referring Physician(s): Ennever,Peter R  Supervising Physician: Philip Cornet  Patient Status: [facility] - Out-pt  History of Present Illness: Richard Bowman is a 49 y.o. male with history of colon cancer known to Interventional Radiology from prior Y90  for liver metastasis.  He has complained of progressive abdominal pain since this procedure.  A CT Chest Abdomen Pelvis performed 04/14/24 showed no complication, no new disease.  However repeating imaging 07/25/24 showed soft tissue nodularity  along in the inferior right lobe of the liver and in the pericolic gutter.  IR consulted for biopsy.   Patient assessed in short stay.  He has several questions including the nature and indication for current biopsy request.  All questions are answered to his satisfaction and he is agreeable to proceed.  His sister is available for post-procedure care and transportation.  He has been NPO.   Past Medical History:  Diagnosis Date   Cancer [facility])    Esophageal ulcer 2007   Renal disorder    CKD 10/28/20 doesn't have it now    Past Surgical History:  Procedure Laterality Date   BIOPSY  11/17/2022   Procedure: BIOPSY;  Surgeon: Shila Gustav GAILS, MD;  Location: [facility];  Service: Gastroenterology;;   COLON RESECTION SIGMOID N/A 11/18/2022   Procedure: OPEN SIGMOID COLECTOMY;  Surgeon: Belinda Cough, MD;  Location: [facility] OR;  Service: General;  Laterality: N/A;   COLONOSCOPY WITH PROPOFOL  N/A 11/17/2022   Procedure: COLONOSCOPY WITH PROPOFOL ;  Surgeon: Shila Gustav GAILS, MD;  Location: [facility];  Service: Gastroenterology;  Laterality: N/A;   ESOPHAGOGASTRODUODENOSCOPY (EGD) WITH PROPOFOL  N/A 11/17/2022   Procedure: ESOPHAGOGASTRODUODENOSCOPY (EGD) WITH PROPOFOL ;  Surgeon: Shila Gustav GAILS, MD;  Location: [facility];  Service: Gastroenterology;  Laterality: N/A;   IR 3D INDEPENDENT WKST  12/23/2023   IR  ANGIOGRAM SELECTIVE EACH ADDITIONAL VESSEL  12/23/2023   IR ANGIOGRAM SELECTIVE EACH ADDITIONAL VESSEL  12/23/2023   IR ANGIOGRAM SELECTIVE EACH ADDITIONAL VESSEL  12/23/2023   IR ANGIOGRAM VISCERAL SELECTIVE  12/23/2023   IR ANGIOGRAM VISCERAL SELECTIVE  12/23/2023   IR EMBO TUMOR ORGAN ISCHEMIA INFARCT INC GUIDE ROADMAPPING  12/23/2023   IR IMAGING GUIDED PORT INSERTION  12/14/2022   IR RADIOLOGIST EVAL & MGMT  11/18/2023   IR RADIOLOGIST EVAL & MGMT  02/18/2024   IR REMOVAL TUN ACCESS W/ PORT W/O FL MOD SED  10/28/2023   IR US  GUIDE VASC ACCESS LEFT  12/23/2023   LIVER BIOPSY N/A 11/18/2022   Procedure: LIVER BIOPSY;  Surgeon: Belinda Cough, MD;  Location: [facility] OR;  Service: General;  Laterality: N/A;   SUBMUCOSAL TATTOO INJECTION  11/17/2022   Procedure: SUBMUCOSAL TATTOO INJECTION;  Surgeon: Nandigam, Kavitha V, MD;  Location: [facility];  Service: Gastroenterology;;    Allergies: Xeloda  [capecitabine ]  Medications: Prior to Admission medications  Medication Sig Start Date End Date Taking? Authorizing Provider  dronabinol  (MARINOL ) 5 MG capsule Take 1 capsule (5 mg total) by mouth 2 (two) times daily before a meal. 04/25/24  Yes Ennever, Maude SAUNDERS, MD  lactose free nutrition (BOOST) LIQD Take 237 mLs by mouth daily as needed (Supplement).   Yes [provider]  ondansetron  (ZOFRAN -ODT) 4 MG disintegrating tablet Take 1 tablet (4 mg total) by mouth every 8 (eight) hours as needed for nausea or vomiting. 12/23/23  Yes Carim, Charles A, PA-C  Oxycodone  HCl 10 MG TABS Take 1 tablet (10  mg total) by mouth every 6 (six) hours as needed for severe pain (pain score 7-10). 08/14/24  Yes Timmy Maude SAUNDERS, MD  acetaminophen  (TYLENOL ) 500 MG tablet Take 2 tablets (1,000 mg total) by mouth every 6 (six) hours as needed for mild pain. 11/22/22   Meuth, Brooke A, PA-C  lidocaine -prilocaine  (EMLA ) cream Apply 1 Application topically as needed. 12/21/22   Timmy Maude SAUNDERS, MD  LORazepam  (ATIVAN ) 0.5 MG  tablet Place 1 tablet (0.5 mg total) under the tongue every 6 (six) hours as needed for anxiety. 02/01/24   Timmy Maude SAUNDERS, MD  nystatin  (MYCOSTATIN ) 100000 UNIT/ML suspension Take 5 mLs (500,000 Units total) by mouth 4 (four) times daily. 12/16/22   Franchot Lauraine HERO, NP  OLANZapine  (ZYPREXA ) 10 MG tablet Take 1 tablet (10 mg total) by mouth at bedtime. 02/01/24   Timmy Maude SAUNDERS, MD  oxyCODONE  (OXY IR/ROXICODONE ) 5 MG immediate release tablet Take 1 tablet (5 mg total) by mouth every 4 (four) hours as needed for severe pain (pain score 7-10). 08/02/24   Timmy Maude SAUNDERS, MD  pantoprazole  (PROTONIX ) 40 MG tablet Take 1 tablet (40 mg total) by mouth 2 (two) times daily. 07/04/24   Franchot Lauraine HERO, NP  trifluridine -tipiracil  (LONSURF ) 20-8.19 MG tablet Take 4 tablets (80 mg of trifluridine  total) by mouth 2 (two) times daily after a meal. Take within 1 hr after AM & PM meals on days 1-5, 8-12. Repeat every 28 days. 04/25/24   Timmy Maude SAUNDERS, MD     Family History  Problem Relation Age of Onset   COPD Mother    Cancer Mother     Social History   Socioeconomic History   Marital status: Single    Spouse name: Not on file   Number of children: 0   Years of education: Not on file   Highest education level: Associate degree: occupational, scientist, product/process development, or vocational program  Occupational History    Comment: NA   Occupation: curator  Tobacco Use   Smoking status: Every Day    Current packs/day: 1.00    Average packs/day: 1 pack/day for 26.0 years (26.0 ttl pk-yrs)    Types: Cigarettes    Passive exposure: Current   Smokeless tobacco: Never   Tobacco comments:    Smoking half a pack/day  Vaping Use   Vaping status: Former  Substance and Sexual Activity   Alcohol use: Yes    Alcohol/week: 0.0 standard drinks of alcohol    Comment: occ   Drug use: No   Sexual activity: Not Currently  Other Topics Concern   Not on file  Social History Narrative   Not on file   Social Drivers of Health    Tobacco Use: High Risk (09/01/2024)   Patient History    Smoking Tobacco Use: Every Day    Smokeless Tobacco Use: Never    Passive Exposure: Current  Financial Resource Strain: Medium Risk (12/02/2022)   Overall Financial Resource Strain (CARDIA)    Difficulty of Paying Living Expenses: Somewhat hard  Food Insecurity: Patient Declined (12/20/2022)   Hunger Vital Sign    Worried About Running Out of Food in the Last Year: Patient declined    Ran Out of Food in the Last Year: Patient declined  Transportation Needs: No Transportation Needs (12/20/2022)   PRAPARE - Administrator, Civil Service (Medical): No    Lack of Transportation (Non-Medical): No  Physical Activity: Not on file  Stress: Not on file  Social Connections: Not  on file  Depression (PHQ2-9): Low Risk (08/03/2024)   Depression (PHQ2-9)    PHQ-2 Score: 0  Alcohol Screen: Not on file  Housing: Low Risk (11/16/2022)   Housing    Last Housing Risk Score: 0  Utilities: Not At Risk (12/20/2022)   AHC Utilities    Threatened with loss of utilities: No  Health Literacy: Not on file     Review of Systems: A 12 point ROS discussed and pertinent positives are indicated in the HPI above.  All other systems are negative.  Review of Systems  Constitutional:  Negative for fatigue and fever.  Respiratory:  Negative for cough and shortness of breath.   Cardiovascular:  Negative for chest pain.  Gastrointestinal:  Negative for abdominal pain, nausea and vomiting.  Musculoskeletal:  Negative for back pain.  Psychiatric/Behavioral:  Negative for behavioral problems and confusion.     Vital Signs: BP 119/82   Pulse 63   Temp 97.9 F (36.6 C) (Oral)   Resp 14   Ht 5' 10 (1.778 m)   Wt 170 lb (77.1 kg)   SpO2 98%   BMI 24.39 kg/m   Physical Exam Vitals and nursing note reviewed.  Constitutional:      General: He is not in acute distress.    Appearance: Normal appearance. He is not ill-appearing.  HENT:      Mouth/Throat:     Mouth: Mucous membranes are moist.     Pharynx: Oropharynx is clear.  Cardiovascular:     Rate and Rhythm: Normal rate and regular rhythm.  Pulmonary:     Effort: Pulmonary effort is normal.     Breath sounds: Normal breath sounds.  Abdominal:     General: Abdomen is flat. There is no distension.     Palpations: Abdomen is soft.     Tenderness: There is abdominal tenderness (generalized to the right side of the abdomen).     Comments: Bruising vs. Irregular patches of hyperpigmentation along the right side of the abdomen. Stable without recent change per patient.   Skin:    General: Skin is warm and dry.  Neurological:     General: No focal deficit present.     Mental Status: He is alert and oriented to person, place, and time. Mental status is at baseline.  Psychiatric:        Mood and Affect: Mood normal.        Behavior: Behavior normal.        Thought Content: Thought content normal.        Judgment: Judgment normal.      MD Evaluation Airway: WNL Heart: WNL Abdomen: WNL Chest/ Lungs: WNL ASA  Classification: 3 Mallampati/Airway Score: Two   Imaging: No results found.  Labs:  CBC: Recent Labs    05/10/24 1043 06/05/24 0927 07/04/24 1257 08/03/24 0829  WBC 4.0 4.0 2.7* 5.1  HGB 14.9 14.2 14.0 14.8  HCT 43.8 41.7 40.7 42.7  PLT 101* 80* 98* 93*    COAGS: Recent Labs    12/23/23 1000  INR 0.9    BMP: Recent Labs    05/10/24 1043 06/05/24 0927 07/04/24 1257 08/03/24 0829  NA 139 141 142 141  K 3.7 3.6 4.1 4.2  CL 102 103 105 103  CO2 25 26 26 28   GLUCOSE 149* 108* 92 98  BUN 20 15 13 13   CALCIUM  9.9 9.5 10.0 10.3  CREATININE 0.97 1.01 0.93 0.91  GFRNONAA >60 >60 >60 >60    LIVER FUNCTION  TESTS: Recent Labs    05/10/24 1043 06/05/24 0927 07/04/24 1257 08/03/24 0829  BILITOT 0.9 0.6 0.8 1.1  AST 79* 68* 79* 79*  ALT 95* 74* 96* 89*  ALKPHOS 364* 419* 392* 344*  PROT 7.1 7.3 7.4 7.6  ALBUMIN  4.1 4.2 4.3 4.4     TUMOR MARKERS: Recent Labs    04/25/24 1015 06/05/24 0927 07/04/24 1258 08/03/24 0829  CEA 33.49* 25.33* 29.80* 52.76*    Assessment and Plan: Patient with past medical history of metastatic colon cancer presents with complaint of increased abdominal pain, right perihepatic soft tissue nodularity.  IR consulted for biopsy at the request of Dr. Timmy. Case reviewed by Dr. Philip who approves patient for procedure.  Patient presents today in their usual state of health.  He has been NPO and is not currently on blood thinners.   Risks and benefits of biopsy was discussed with the patient and/or patient's family including, but not limited to bleeding, infection, damage to adjacent structures or low yield requiring additional tests.  All of the questions were answered and there is agreement to proceed.  Consent signed and in chart.   Thank you for this interesting consult.  I greatly enjoyed meeting Richard Bowman and look forward to participating in their care.  A copy of this report was sent to the requesting provider on this date.  Electronically Signed: Rhenda [place] Sue-Ellen Elya Diloreto, PA 09/01/2024, 12:28 PM   I spent a total of  30 Minutes   in face to face in clinical consultation, greater than 50% of which was counseling/coordinating care for metastatic colon cancer, perihepatic soft tissue nodularity.  "

## 2024-09-01 NOTE — Sedation Documentation (Addendum)
 Per Dr. Philip, nothing currently to biopsy observed on CT scan; no meds given; okay to remove IV and discharge per Dr. Philip

## 2024-09-04 ENCOUNTER — Other Ambulatory Visit (HOSPITAL_BASED_OUTPATIENT_CLINIC_OR_DEPARTMENT_OTHER): Payer: Self-pay

## 2024-09-04 ENCOUNTER — Other Ambulatory Visit: Payer: Self-pay | Admitting: Hematology & Oncology

## 2024-09-04 ENCOUNTER — Other Ambulatory Visit: Payer: Self-pay

## 2024-09-04 MED ORDER — OXYCODONE HCL 10 MG PO TABS
10.0000 mg | ORAL_TABLET | Freq: Four times a day (QID) | ORAL | 0 refills | Status: DC | PRN
  Filled 2024-09-04: qty 90, 23d supply, fill #0

## 2024-09-05 ENCOUNTER — Encounter: Payer: Self-pay | Admitting: *Deleted

## 2024-09-05 NOTE — Progress Notes (Signed)
 Patient was scheduled for a biopsy, however CT did not show any appropriate biopsy sites. Biopsy cancelled.   Oncology Nurse Navigator Documentation     09/05/2024    7:30 AM  Oncology Nurse Navigator Flowsheets  Navigator Follow Up Date: 09/08/2024  Navigator Follow Up Reason: Follow-up Appointment  Navigator Location [facility]  Navigator Encounter Type Follow-up Appt  Patient Visit Type MedOnc  Treatment Phase Active Tx  Barriers/Navigation Needs No Barriers At This Time  Interventions None Required  Acuity Level 1-No Barriers  Time Spent with Patient 15

## 2024-09-08 ENCOUNTER — Inpatient Hospital Stay: Attending: Hematology & Oncology

## 2024-09-08 ENCOUNTER — Encounter: Payer: Self-pay | Admitting: Family

## 2024-09-08 ENCOUNTER — Encounter: Payer: Self-pay | Admitting: *Deleted

## 2024-09-08 ENCOUNTER — Inpatient Hospital Stay: Admitting: Family

## 2024-09-08 ENCOUNTER — Other Ambulatory Visit (HOSPITAL_BASED_OUTPATIENT_CLINIC_OR_DEPARTMENT_OTHER): Payer: Self-pay

## 2024-09-08 ENCOUNTER — Inpatient Hospital Stay

## 2024-09-08 VITALS — BP 129/79 | HR 58 | Temp 97.8°F | Resp 19 | Ht 70.0 in | Wt 180.0 lb

## 2024-09-08 DIAGNOSIS — D649 Anemia, unspecified: Secondary | ICD-10-CM | POA: Diagnosis not present

## 2024-09-08 DIAGNOSIS — R112 Nausea with vomiting, unspecified: Secondary | ICD-10-CM | POA: Diagnosis not present

## 2024-09-08 DIAGNOSIS — K5900 Constipation, unspecified: Secondary | ICD-10-CM | POA: Insufficient documentation

## 2024-09-08 DIAGNOSIS — C189 Malignant neoplasm of colon, unspecified: Secondary | ICD-10-CM | POA: Insufficient documentation

## 2024-09-08 DIAGNOSIS — R1010 Upper abdominal pain, unspecified: Secondary | ICD-10-CM | POA: Insufficient documentation

## 2024-09-08 DIAGNOSIS — C787 Secondary malignant neoplasm of liver and intrahepatic bile duct: Secondary | ICD-10-CM

## 2024-09-08 DIAGNOSIS — M25552 Pain in left hip: Secondary | ICD-10-CM | POA: Insufficient documentation

## 2024-09-08 DIAGNOSIS — M25551 Pain in right hip: Secondary | ICD-10-CM | POA: Insufficient documentation

## 2024-09-08 DIAGNOSIS — K921 Melena: Secondary | ICD-10-CM | POA: Insufficient documentation

## 2024-09-08 LAB — CBC WITH DIFFERENTIAL (CANCER CENTER ONLY)
Abs Immature Granulocytes: 0.02 K/uL (ref 0.00–0.07)
Basophils Absolute: 0 K/uL (ref 0.0–0.1)
Basophils Relative: 0 %
Eosinophils Absolute: 0.3 K/uL (ref 0.0–0.5)
Eosinophils Relative: 4 %
HCT: 47.1 % (ref 39.0–52.0)
Hemoglobin: 15.8 g/dL (ref 13.0–17.0)
Immature Granulocytes: 0 %
Lymphocytes Relative: 27 %
Lymphs Abs: 1.8 K/uL (ref 0.7–4.0)
MCH: 31.3 pg (ref 26.0–34.0)
MCHC: 33.5 g/dL (ref 30.0–36.0)
MCV: 93.5 fL (ref 80.0–100.0)
Monocytes Absolute: 0.6 K/uL (ref 0.1–1.0)
Monocytes Relative: 9 %
Neutro Abs: 4 K/uL (ref 1.7–7.7)
Neutrophils Relative %: 60 %
Platelet Count: 107 K/uL — ABNORMAL LOW (ref 150–400)
RBC: 5.04 MIL/uL (ref 4.22–5.81)
RDW: 13 % (ref 11.5–15.5)
WBC Count: 6.7 K/uL (ref 4.0–10.5)
nRBC: 0 % (ref 0.0–0.2)

## 2024-09-08 LAB — IRON AND IRON BINDING CAPACITY (CC-WL,HP ONLY)
Iron: 115 ug/dL (ref 45–182)
Saturation Ratios: 34 % (ref 17.9–39.5)
TIBC: 335 ug/dL (ref 250–450)
UIBC: 220 ug/dL

## 2024-09-08 LAB — CMP (CANCER CENTER ONLY)
ALT: 95 U/L — ABNORMAL HIGH (ref 0–44)
AST: 75 U/L — ABNORMAL HIGH (ref 15–41)
Albumin: 4.5 g/dL (ref 3.5–5.0)
Alkaline Phosphatase: 388 U/L — ABNORMAL HIGH (ref 38–126)
Anion gap: 9 (ref 5–15)
BUN: 17 mg/dL (ref 6–20)
CO2: 29 mmol/L (ref 22–32)
Calcium: 10.4 mg/dL — ABNORMAL HIGH (ref 8.9–10.3)
Chloride: 101 mmol/L (ref 98–111)
Creatinine: 0.84 mg/dL (ref 0.61–1.24)
GFR, Estimated: >60 mL/min
Glucose, Bld: 100 mg/dL — ABNORMAL HIGH (ref 70–99)
Potassium: 4.4 mmol/L (ref 3.5–5.1)
Sodium: 139 mmol/L (ref 135–145)
Total Bilirubin: 0.8 mg/dL (ref 0.0–1.2)
Total Protein: 7.5 g/dL (ref 6.5–8.1)

## 2024-09-08 LAB — PREALBUMIN: Prealbumin: 24 mg/dL (ref 18–38)

## 2024-09-08 LAB — FERRITIN: Ferritin: 617 ng/mL — ABNORMAL HIGH (ref 24–336)

## 2024-09-08 LAB — LACTATE DEHYDROGENASE: LDH: 136 U/L (ref 105–235)

## 2024-09-08 LAB — CEA (ACCESS): CEA (CHCC): 106.29 ng/mL — ABNORMAL HIGH (ref 0.00–5.00)

## 2024-09-08 MED ORDER — ONDANSETRON 4 MG PO TBDP
4.0000 mg | ORAL_TABLET | Freq: Three times a day (TID) | ORAL | 3 refills | Status: AC | PRN
  Filled 2024-09-08: qty 40, 14d supply, fill #0

## 2024-09-08 MED ORDER — SODIUM CHLORIDE 0.9 % IV SOLN: Freq: Once | INTRAVENOUS | Status: AC

## 2024-09-08 MED ORDER — HYDROMORPHONE HCL 1 MG/ML IJ SOLN
1.0000 mg | Freq: Once | INTRAMUSCULAR | Status: AC
  Administered 2024-09-08: 1 mg via INTRAVENOUS
  Filled 2024-09-08: qty 1

## 2024-09-08 MED ORDER — FENTANYL 25 MCG/HR TD PT72
1.0000 | MEDICATED_PATCH | TRANSDERMAL | 0 refills | Status: DC
  Filled 2024-09-08: qty 10, 30d supply, fill #0

## 2024-09-08 NOTE — Patient Instructions (Signed)
 Dehydration, Adult Dehydration is a condition in which there is not enough water or other fluids in the body. This happens when a person loses more fluids than they take in. Important organs cannot work right without the right amount of fluids. Any loss of fluids from the body can cause dehydration. Dehydration can be mild, worse, or very bad. It should be treated right away to keep it from getting very bad. What are the causes? Conditions that cause loss of water in the body. They include: Watery poop (diarrhea). Vomiting. Sweating a lot. Fever. Infection. Peeing (urinating) a lot. Not drinking enough fluids. Certain medicines, such as medicines that take extra fluid out of the body (diuretics). Lack of safe drinking water. Not being able to get enough water and food. What increases the risk? Having a long-term (chronic) illness that has not been treated the right way, such as: Diabetes. Heart disease. Kidney disease. Being 25 years of age or older. Having a disability. Living in a place that is high above the ground or sea (high in altitude). The thinner, drier air causes more fluid loss. Doing exercises that put stress on your body for a long time. Being active when in hot places. What are the signs or symptoms? Symptoms of dehydration depend on how bad it is. Mild or worse dehydration Thirst. Dry lips or dry mouth. Feeling dizzy or light-headed. Muscle cramps. Passing little pee or dark pee. Pee may be the color of tea. Headache. Very bad dehydration Changes in skin. Skin may: Be cold to the touch (clammy). Be blotchy or pale. Not go back to normal right after you pinch it and let it go. Little or no tears, pee, or sweat. Fast breathing. Low blood pressure. Weak pulse. Pulse that is more than 100 beats a minute when you are sitting still. Other changes, such as: Feeling very thirsty. Eyes that look hollow (sunken). Cold hands and feet. Being confused. Being very  tired (lethargic) or having trouble waking from sleep. Losing weight. Loss of consciousness. How is this treated? Treatment for this condition depends on how bad your dehydration is. Treatment should start right away. Do not wait until your condition gets very bad. Very bad dehydration is an emergency. You will need to go to a hospital. Mild or worse dehydration can be treated at home. You may be asked to: Drink more fluids. Drink an oral rehydration solution (ORS). This drink gives you the right amount of fluids, salts, and minerals (electrolytes). Very bad dehydration can be treated: With fluids through an IV tube. By correcting low levels of electrolytes in the body. By treating the problem that caused your dehydration. Follow these instructions at home: Oral rehydration solution If told by your doctor, drink an ORS: Make an ORS. Use instructions on the package. Start by drinking small amounts, about  cup (120 mL) every 5-10 minutes. Slowly drink more until you have had the amount that your doctor said to have.  Eating and drinking  Drink enough clear fluid to keep your pee pale yellow. If you were told to drink an ORS, finish the ORS first. Then, start slowly drinking other clear fluids. Drink fluids such as: Water. Do not drink only water. Doing that can make the salt (sodium) level in your body get too low. Water from ice chips you suck on. Fruit juice that you have added water to (diluted). Low-calorie sports drinks. Eat foods that have the right amounts of salts and minerals, such as bananas, oranges, potatoes,  tomatoes, or spinach. Do not drink alcohol. Avoid drinks that have caffeine or sugar. These include:: High-calorie sports drinks. Fruit juice that you did not add water to. Soda. Coffee or energy drinks. Avoid foods that are greasy or have a lot of fat or sugar. General instructions Take over-the-counter and prescription medicines only as told by your doctor. Do  not take sodium tablets. Doing that can make the salt level in your body get too high. Return to your normal activities as told by your doctor. Ask your doctor what activities are safe for you. Keep all follow-up visits. Your doctor may check and change your treatment. Contact a doctor if: You have pain in your belly (abdomen) and the pain: Gets worse. Stays in one place. You have a rash. You have a stiff neck. You get angry or annoyed more easily than normal. You are more tired or have a harder time waking than normal. You feel weak or dizzy. You feel very thirsty. Get help right away if: You have any symptoms of very bad dehydration. You vomit every time you eat or drink. Your vomiting gets worse, does not go away, or you vomit blood or green stuff. You are getting treatment, but symptoms are getting worse. You have a fever. You have a very bad headache. You have: Diarrhea that gets worse or does not go away. Blood in your poop (stool). This may cause poop to look black and tarry. No pee in 6-8 hours. Only a small amount of pee in 6-8 hours, and the pee is very dark. You have trouble breathing. These symptoms may be an emergency. Get help right away. Call 911. Do not wait to see if the symptoms will go away. Do not drive yourself to the hospital. This information is not intended to replace advice given to you by your health care provider. Make sure you discuss any questions you have with your health care provider. Document Revised: 03/16/2022 Document Reviewed: 03/16/2022 Elsevier Patient Education  2024 [employer].

## 2024-09-08 NOTE — Progress Notes (Signed)
 " Hematology and Oncology Follow Up Visit  Richard Bowman [id number] 04/22/1976 49 y.o. 09/08/2024   Principle Diagnosis:  Metastatic adenocarcinoma of the colon-liver metastasis -- pMMR/MSI-low -- (+) KRAS/ ERBB2(+) Iron deficiency anemia Superficial thrombus of the right forearm   Current Therapy:        FOLFOXIRI - s/p cycle #11  - start on  -- 12/09/2022 --oxaliplatin  dropped after 04/19/2023 IV iron as indicated Xeloda /Avastin  -- s/p cycle #1 on 09/13/2023 - Avastin  on hold - Xeloda  d/c'ed on 10/15/2023 Enhertu  - s/p cycle #2 - start on 11/26/2023 -DC on 04/25/2024 Intrahepatic Y-90 -first dose on 12/23/2023 Xarelto  10 mg p.o. daily-started on 12/19/2023 Avastin /Lonsurf  80 mg po BID (d1-5;d 8-12) - started 05/02/2024, s/p cycle #3 - d/c on 08/03/2024   Interim History:  Richard Bowman is here today for follow-up. Unfortunately, he went for biopsy but CT did not show any appropriate biopsy sites.  CT scan showed stable metastatic disease.  Avastin  and Lonsurf  will remain on hold for now per Dr. Timmy.  He c/o persistent pain across the top of the abdomen as well as the pelvis and hips.  He has also had nausea in the am with occasional vomiting prior to eating breakfast.  He has not started the Zyprexa  and needs a refill on his zofran .  He is currently on short acting pain management which he feels is not controlling his discomfort. No fever, chills, cough, rash, dizziness, SOB, chest pain, palpitations or changes in bowel or bladder habits at this time.  He has constipation at times with occasional bright red blood in his stool from straining.  No swelling, numbness or tingling in his hands or feet.  Appetite comes and goes and he admits that he needs to better hydrate throughout the day. Weight is stable at 180 lbs.   ECOG Performance Status: 1 - Symptomatic but completely ambulatory  Medications:  Allergies as of 09/08/2024       Reactions   Xeloda  [capecitabine ] Nausea And Vomiting,  Other (See Comments)   Chest pain        Medication List        Accurate as of September 08, 2024  9:40 AM. If you have any questions, ask your nurse or doctor.          acetaminophen  500 MG tablet Commonly known as: TYLENOL  Take 2 tablets (1,000 mg total) by mouth every 6 (six) hours as needed for mild pain.   dronabinol  5 MG capsule Commonly known as: MARINOL  Take 1 capsule (5 mg total) by mouth 2 (two) times daily before a meal.   lactose free nutrition Liqd Take 237 mLs by mouth daily as needed (Supplement).   lidocaine -prilocaine  cream Commonly known as: EMLA  Apply 1 Application topically as needed.   Lonsurf  20-8.19 MG tablet Generic drug: trifluridine -tipiracil  Take 4 tablets (80 mg of trifluridine  total) by mouth 2 (two) times daily after a meal. Take within 1 hr after AM & PM meals on days 1-5, 8-12. Repeat every 28 days.   LORazepam  0.5 MG tablet Commonly known as: ATIVAN  Place 1 tablet (0.5 mg total) under the tongue every 6 (six) hours as needed for anxiety.   nystatin  100000 UNIT/ML suspension Commonly known as: MYCOSTATIN  Take 5 mLs (500,000 Units total) by mouth 4 (four) times daily.   OLANZapine  10 MG tablet Commonly known as: ZYPREXA  Take 1 tablet (10 mg total) by mouth at bedtime.   ondansetron  4 MG disintegrating tablet Commonly known as: ZOFRAN -ODT Take 1  tablet (4 mg total) by mouth every 8 (eight) hours as needed for nausea or vomiting.   oxyCODONE  5 MG immediate release tablet Commonly known as: Oxy IR/ROXICODONE  Take 1 tablet (5 mg total) by mouth every 4 (four) hours as needed for severe pain (pain score 7-10).   Oxycodone  HCl 10 MG Tabs Take 1 tablet (10 mg total) by mouth every 6 (six) hours as needed.   pantoprazole  40 MG tablet Commonly known as: Protonix  Take 1 tablet (40 mg total) by mouth 2 (two) times daily.        Allergies: Allergies[1]  Past Medical History, Surgical history, Social history, and Family History were  reviewed and updated.  Review of Systems: All other 10 point review of systems is negative.   Physical Exam:  vitals were not taken for this visit.   Wt Readings from Last 3 Encounters:  09/01/24 170 lb (77.1 kg)  08/03/24 179 lb (81.2 kg)  07/04/24 186 lb (84.4 kg)    Ocular: Sclerae unicteric, pupils equal, round and reactive to light Ear-nose-throat: Oropharynx clear, dentition fair Lymphatic: No cervical or supraclavicular adenopathy Lungs no rales or rhonchi, good excursion bilaterally Heart regular rate and rhythm, no murmur appreciated Abd soft, nontender, positive bowel sounds MSK no focal spinal tenderness, no joint edema Neuro: non-focal, well-oriented, appropriate affect Breasts: Deferred   Lab Results  Component Value Date   WBC 5.2 09/01/2024   HGB 14.3 09/01/2024   HCT 41.6 09/01/2024   MCV 93.7 09/01/2024   PLT 101 (L) 09/01/2024   Lab Results  Component Value Date   FERRITIN 772 (H) 08/03/2024   IRON 110 08/03/2024   TIBC 316 08/03/2024   UIBC 206 08/03/2024   IRONPCTSAT 35 08/03/2024   Lab Results  Component Value Date   RETICCTPCT 0.7 04/25/2024   RBC 4.44 09/01/2024   No results found for: KPAFRELGTCHN, LAMBDASER, KAPLAMBRATIO No results found for: IGGSERUM, IGA, IGMSERUM No results found for: STEPHANY CARLOTA BENSON MARKEL EARLA JOANNIE DOC VICK, SPEI   Chemistry      Component Value Date/Time   NA 141 08/03/2024 0829   K 4.2 08/03/2024 0829   CL 103 08/03/2024 0829   CO2 28 08/03/2024 0829   BUN 13 08/03/2024 0829   CREATININE 0.91 08/03/2024 0829      Component Value Date/Time   CALCIUM  10.3 08/03/2024 0829   ALKPHOS 344 (H) 08/03/2024 0829   AST 79 (H) 08/03/2024 0829   ALT 89 (H) 08/03/2024 0829   BILITOT 1.1 08/03/2024 0829       Impression and Plan: Richard Bowman is a very nice 49 yo caucasian gentleman with metastatic colon cancer. He had progression on Enhertu .   CEA at last visit  was 52 (previously 29). Today's results is pending.  Lonsurf /Avastin  will remain on hold per Dr. Timmy.  He will start Zyprexa  for nasuea. Zofran  refilled.  We will add Duragesic  patch 25 mcg for better pain control. He will let us  know if this helps.  No new intervention at this time.  Follow-up with MD top reassess in 3 weeks.    Lauraine Pepper, NP 1/9/20269:40 AM     [1]  Allergies Allergen Reactions   Xeloda  [Capecitabine ] Nausea And Vomiting and Other (See Comments)    Chest pain   "

## 2024-09-08 NOTE — Progress Notes (Signed)
 Patient has not had any navigation needs for some time. He continues to have poor tolerance of treatment and this has been held. He will receive symptoms management today. Will discontinue active navigation at this time but be available as needed.  Oncology Nurse Navigator Documentation     09/08/2024    9:30 AM  Oncology Nurse Navigator Flowsheets  Navigation Complete Date: 09/08/2024  Post Navigation: Continue to Follow Patient? No  Reason Not Navigating Patient: No Treatment, Observation Only  Navigator Location [facility]  Navigator Encounter Type Follow-up Appt  Patient Visit Type MedOnc  Treatment Phase Other  Barriers/Navigation Needs No Barriers At This Time  Interventions None Required  Acuity Level 1-No Barriers  Time Spent with Patient 15

## 2024-09-13 ENCOUNTER — Inpatient Hospital Stay

## 2024-09-13 ENCOUNTER — Encounter: Payer: Self-pay | Admitting: Nurse Practitioner

## 2024-09-13 ENCOUNTER — Other Ambulatory Visit (HOSPITAL_BASED_OUTPATIENT_CLINIC_OR_DEPARTMENT_OTHER): Payer: Self-pay

## 2024-09-13 ENCOUNTER — Inpatient Hospital Stay: Admitting: Nurse Practitioner

## 2024-09-13 VITALS — BP 129/86 | HR 71 | Temp 97.8°F | Resp 16 | Ht 70.0 in | Wt 182.1 lb

## 2024-09-13 DIAGNOSIS — G893 Neoplasm related pain (acute) (chronic): Secondary | ICD-10-CM

## 2024-09-13 DIAGNOSIS — G2581 Restless legs syndrome: Secondary | ICD-10-CM

## 2024-09-13 DIAGNOSIS — C189 Malignant neoplasm of colon, unspecified: Secondary | ICD-10-CM

## 2024-09-13 DIAGNOSIS — R530 Neoplastic (malignant) related fatigue: Secondary | ICD-10-CM

## 2024-09-13 DIAGNOSIS — M792 Neuralgia and neuritis, unspecified: Secondary | ICD-10-CM

## 2024-09-13 DIAGNOSIS — F419 Anxiety disorder, unspecified: Secondary | ICD-10-CM

## 2024-09-13 DIAGNOSIS — G620 Drug-induced polyneuropathy: Secondary | ICD-10-CM | POA: Diagnosis not present

## 2024-09-13 DIAGNOSIS — Z515 Encounter for palliative care: Secondary | ICD-10-CM

## 2024-09-13 DIAGNOSIS — R112 Nausea with vomiting, unspecified: Secondary | ICD-10-CM

## 2024-09-13 DIAGNOSIS — R110 Nausea: Secondary | ICD-10-CM

## 2024-09-13 DIAGNOSIS — Z7189 Other specified counseling: Secondary | ICD-10-CM

## 2024-09-13 DIAGNOSIS — F17210 Nicotine dependence, cigarettes, uncomplicated: Secondary | ICD-10-CM

## 2024-09-13 DIAGNOSIS — T451X5S Adverse effect of antineoplastic and immunosuppressive drugs, sequela: Secondary | ICD-10-CM | POA: Diagnosis not present

## 2024-09-13 DIAGNOSIS — C787 Secondary malignant neoplasm of liver and intrahepatic bile duct: Secondary | ICD-10-CM

## 2024-09-13 DIAGNOSIS — R630 Anorexia: Secondary | ICD-10-CM

## 2024-09-13 NOTE — Progress Notes (Signed)
 "    Palliative Medicine [facility]  [phone] [phone]) [phone]   Name: Richard Bowman Date: 09/13/2024 MRN: [id number]  DOB: July 09, 1976  Patient Care Team: Jaycee Greig PARAS, NP as PCP - General (Nurse Practitioner) Timmy Maude SAUNDERS, MD as Consulting Physician (Oncology)    REASON FOR CONSULTATION: Richard Bowman is a 49 y.o. male with oncologic medical history including metastatic colon adenocarcinoma with liver involvement s/p FOLFOXIRI, Xeloda /Avastin , Enhertu , Y-90 (11/2023) .  Palliative is seeing patient for symptom management and goals of care.    SOCIAL HISTORY:     reports that he has been smoking cigarettes. He has a 26 pack-year smoking history. He has been exposed to tobacco smoke. He has never used smokeless tobacco. He reports current alcohol use. He reports that he does not use drugs.  ADVANCE DIRECTIVES:  None on file   CODE STATUS: Full code  PAST MEDICAL HISTORY: Past Medical History:  Diagnosis Date   Cancer (HCC)    Esophageal ulcer 2007   Renal disorder    CKD 10/28/20 doesn't have it now    PAST SURGICAL HISTORY:  Past Surgical History:  Procedure Laterality Date   BIOPSY  11/17/2022   Procedure: BIOPSY;  Surgeon: Shila Gustav GAILS, MD;  Location: [facility];  Service: Gastroenterology;;   COLON RESECTION SIGMOID N/A 11/18/2022   Procedure: OPEN SIGMOID COLECTOMY;  Surgeon: Belinda Cough, MD;  Location: [facility] OR;  Service: General;  Laterality: N/A;   COLONOSCOPY WITH PROPOFOL  N/A 11/17/2022   Procedure: COLONOSCOPY WITH PROPOFOL ;  Surgeon: Shila Gustav GAILS, MD;  Location: [facility];  Service: Gastroenterology;  Laterality: N/A;   ESOPHAGOGASTRODUODENOSCOPY (EGD) WITH PROPOFOL  N/A 11/17/2022   Procedure: ESOPHAGOGASTRODUODENOSCOPY (EGD) WITH PROPOFOL ;  Surgeon: Shila Gustav GAILS, MD;  Location: [facility];  Service: Gastroenterology;  Laterality: N/A;   IR 3D INDEPENDENT WKST  12/23/2023   IR ANGIOGRAM  SELECTIVE EACH ADDITIONAL VESSEL  12/23/2023   IR ANGIOGRAM SELECTIVE EACH ADDITIONAL VESSEL  12/23/2023   IR ANGIOGRAM SELECTIVE EACH ADDITIONAL VESSEL  12/23/2023   IR ANGIOGRAM VISCERAL SELECTIVE  12/23/2023   IR ANGIOGRAM VISCERAL SELECTIVE  12/23/2023   IR EMBO TUMOR ORGAN ISCHEMIA INFARCT INC GUIDE ROADMAPPING  12/23/2023   IR IMAGING GUIDED PORT INSERTION  12/14/2022   IR RADIOLOGIST EVAL & MGMT  11/18/2023   IR RADIOLOGIST EVAL & MGMT  02/18/2024   IR REMOVAL TUN ACCESS W/ PORT W/O FL MOD SED  10/28/2023   IR US  GUIDE VASC ACCESS LEFT  12/23/2023   LIVER BIOPSY N/A 11/18/2022   Procedure: LIVER BIOPSY;  Surgeon: Belinda Cough, MD;  Location: [facility] OR;  Service: General;  Laterality: N/A;   SUBMUCOSAL TATTOO INJECTION  11/17/2022   Procedure: SUBMUCOSAL TATTOO INJECTION;  Surgeon: Shila Gustav GAILS, MD;  Location: [facility];  Service: Gastroenterology;;    HEMATOLOGY/ONCOLOGY HISTORY:  Oncology History  Colon cancer metastasized to liver (HCC)  11/17/2022 Initial Diagnosis   Colon cancer metastasized to liver   12/01/2022 Cancer Staging   Staging form: Colon and Rectum, AJCC 8th Edition - Clinical stage from 12/01/2022: cT4, cN2, pM1 - Signed by Timmy Maude SAUNDERS, MD on 12/01/2022 Stage prefix: Initial diagnosis Histologic grade (G): G2 Histologic grading system: 4 grade system   12/16/2022 - 07/28/2023 Chemotherapy   Patient is on Treatment Plan : COLORECTAL FOLFOXIRI q14d     12/25/2022 Genetic Testing   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  The report date is December 25, 2022.  The Multi-Cancer + RNA Panel offered by [facility] includes sequencing and/or deletion/duplication analysis of the following 70 genes:  AIP*, ALK, APC*, ATM*, AXIN2*, BAP1*, BARD1*, BLM*, BMPR1A*, BRCA1*, BRCA2*, BRIP1*, CDC73*, CDH1*, CDK4, CDKN1B*, CDKN2A, CHEK2*, CTNNA1*, DICER1*, EPCAM (del/dup only), EGFR, FH*, FLCN*, GREM1 (promoter dup only), HOXB13, KIT,  LZTR1, MAX*, MBD4, MEN1*, MET, MITF, MLH1*, MSH2*, MSH3*, MSH6*, MUTYH*, NF1*, NF2*, NTHL1*, PALB2*, PDGFRA, PMS2*, POLD1*, POLE*, POT1*, PRKAR1A*, PTCH1*, PTEN*, RAD51C*, RAD51D*, RB1*, RET, SDHA* (sequencing only), SDHAF2*, SDHB*, SDHC*, SDHD*, SMAD4*, SMARCA4*, SMARCB1*, SMARCE1*, STK11*, SUFU*, TMEM127*, TP53*, TSC1*, TSC2*, VHL*. RNA analysis is performed for * genes.    09/10/2023 - 09/10/2023 Chemotherapy   Patient is on Treatment Plan : COLORECTAL Bevacizumab  q21d     11/29/2023 - 02/25/2024 Chemotherapy   Patient is on Treatment Plan : COLORECTAL Fam-Trastuzumab Deruxtecan-nxki  (Enhertu ) (5.4) q21d     05/10/2024 - 06/05/2024 Chemotherapy   Patient is on Treatment Plan : COLORECTAL Bevacizumab  q21d       ALLERGIES:  is allergic to xeloda  [capecitabine ].  MEDICATIONS:  Current Outpatient Medications  Medication Sig Dispense Refill   acetaminophen  (TYLENOL ) 500 MG tablet Take 2 tablets (1,000 mg total) by mouth every 6 (six) hours as needed for mild pain. 30 tablet 0   dronabinol  (MARINOL ) 5 MG capsule Take 1 capsule (5 mg total) by mouth 2 (two) times daily before a meal. 60 capsule 0   fentaNYL  (DURAGESIC ) 25 MCG/HR Place 1 patch onto the skin every 3 (three) days. 10 patch 0   lactose free nutrition (BOOST) LIQD Take 237 mLs by mouth daily as needed (Supplement).     lidocaine -prilocaine  (EMLA ) cream Apply 1 Application topically as needed. 30 g 0   LORazepam  (ATIVAN ) 0.5 MG tablet Place 1 tablet (0.5 mg total) under the tongue every 6 (six) hours as needed for anxiety. 60 tablet 0   nystatin  (MYCOSTATIN ) 100000 UNIT/ML suspension Take 5 mLs (500,000 Units total) by mouth 4 (four) times daily. 473 mL 1   OLANZapine  (ZYPREXA ) 10 MG tablet Take 1 tablet (10 mg total) by mouth at bedtime. 30 tablet 6   ondansetron  (ZOFRAN -ODT) 4 MG disintegrating tablet Take 1 tablet (4 mg total) by mouth every 8 (eight) hours as needed for nausea or vomiting. 40 tablet 3   oxyCODONE  (OXY IR/ROXICODONE ) 5  MG immediate release tablet Take 1 tablet (5 mg total) by mouth every 4 (four) hours as needed for severe pain (pain score 7-10). 60 tablet 0   Oxycodone  HCl 10 MG TABS Take 1 tablet (10 mg total) by mouth every 6 (six) hours as needed. 90 tablet 0   pantoprazole  (PROTONIX ) 40 MG tablet Take 1 tablet (40 mg total) by mouth 2 (two) times daily. 60 tablet 4   trifluridine -tipiracil  (LONSURF ) 20-8.19 MG tablet Take 4 tablets (80 mg of trifluridine  total) by mouth 2 (two) times daily after a meal. Take within 1 hr after AM & PM meals on days 1-5, 8-12. Repeat every 28 days. 80 tablet 4   No current facility-administered medications for this visit.    VITAL SIGNS: BP 129/86 (BP Location: Left Arm, Patient Position: Sitting)   Pulse 71   Temp 97.8 F (36.6 C)   Resp 16   Ht 5' 10 (1.778 m)   Wt 182 lb 1.6 oz (82.6 kg)   SpO2 98%   BMI 26.13 kg/m  Filed Weights   09/13/24 0957  Weight: 182 lb 1.6 oz (82.6 kg)    Estimated body mass index  is 26.13 kg/m as calculated from the following:   Height as of this encounter: 5' 10 (1.778 m).   Weight as of this encounter: 182 lb 1.6 oz (82.6 kg).  LABS: CBC:    Component Value Date/Time   WBC 6.7 09/08/2024 0923   WBC 5.2 09/01/2024 1234   HGB 15.8 09/08/2024 0923   HCT 47.1 09/08/2024 0923   PLT 107 (L) 09/08/2024 0923   MCV 93.5 09/08/2024 0923   NEUTROABS 4.0 09/08/2024 0923   LYMPHSABS 1.8 09/08/2024 0923   MONOABS 0.6 09/08/2024 0923   EOSABS 0.3 09/08/2024 0923   BASOSABS 0.0 09/08/2024 0923   Comprehensive Metabolic Panel:    Component Value Date/Time   NA 139 09/08/2024 0923   K 4.4 09/08/2024 0923   CL 101 09/08/2024 0923   CO2 29 09/08/2024 0923   BUN 17 09/08/2024 0923   CREATININE 0.84 09/08/2024 0923   GLUCOSE 100 (H) 09/08/2024 0923   CALCIUM  10.4 (H) 09/08/2024 0923   AST 75 (H) 09/08/2024 0923   ALT 95 (H) 09/08/2024 0923   ALKPHOS 388 (H) 09/08/2024 0923   BILITOT 0.8 09/08/2024 0923   PROT 7.5 09/08/2024  0923   ALBUMIN  4.5 09/08/2024 0923    RADIOGRAPHIC STUDIES: CT ABDOMEN LIMITED WO CONTRAST Result Date: 09/01/2024 CLINICAL DATA:  49 year old with history of metastatic colon cancer. CT from 07/25/2024 demonstrated stable hepatic lesions but concern for new soft tissue along the inferior right hepatic lobe. Patient was scheduled for image guided biopsy of this area. EXAM: CT ABDOMEN WITHOUT CONTRAST LIMITED TECHNIQUE: Multidetector CT imaging of the abdomen was performed following the standard protocol without IV contrast. RADIATION DOSE REDUCTION: This exam was performed according to the departmental dose-optimization program which includes automated exposure control, adjustment of the mA and/or kV according to patient size and/or use of iterative reconstruction technique. COMPARISON:  CT 07/25/2024 FINDINGS: Lower chest: Again noted are multiple small pulmonary nodules. Index pulmonary nodule in the left lower lobe on image 1, sequence 2 measures 7 mm and not significantly changed. Index pulmonary nodule in the right middle lobe on image 9 measures 7 mm and stable. Hepatobiliary: Again noted are multiple hepatic lesions with calcifications. Right hepatic lobe has a lobulated configuration and slightly increased perihepatic ascites. Trace stranding or fluid inferior to the right hepatic lobe and in the right paracolic gutter is again noted. There is no significant soft tissue nodularity in this area. Difficult to exclude cholelithiasis. Pancreas: Unremarkable. No pancreatic ductal dilatation or surrounding inflammatory changes. Spleen: Stable splenomegaly. Adrenals/Urinary Tract: Normal appearance of the adrenal glands. Normal appearance of both kidneys without hydronephrosis. Stomach/Bowel: Moderate amount of stool in the right colon. No evidence for a bowel obstruction. Vascular/Lymphatic: No significant lymph node enlargement in the visualized abdomen. Other: No definite peritoneal nodularity or omental  disease. Musculoskeletal: No acute abnormality. IMPRESSION: 1. Minimal stranding or fluid inferior to the right hepatic lobe in the right paracolic gutter region. No significant change since 07/25/2024. No obvious target for a percutaneous biopsy. Percutaneous biopsy was not performed. Recommend continued imaging surveillance. 2. Multiple hepatic lesions with calcifications. Liver disease could be better evaluated with post contrast imaging or MRI. Electronically Signed   By: Juliene Balder M.D.   On: 09/01/2024 17:18    PERFORMANCE STATUS (ECOG) : 1 - Symptomatic but completely ambulatory  Review of Systems  Constitutional:  Positive for appetite change and fatigue.  Musculoskeletal:  Positive for arthralgias and back pain.  Unless otherwise noted, a complete review  of systems is negative.  Physical Exam General: NAD Cardiovascular: regular rate and rhythm Pulmonary: clear ant fields Extremities: no edema, no joint deformities Skin: no rashes Neurological: Alert and oriented x3  Discussed the use of AI scribe software for clinical note transcription with the patient, who gave verbal consent to proceed.  History of Present Illness Richard Bowman is a 49 year old male with metastatic colon cancer who presents to clinic for his initial palliative visit. He expresses concerns with increased pain and sensory changes following Y90 treatment. No family present. Patient alert and able to engage appropriately in discussions.   I introduced myself, Maygan RN, and Palliative's role in collaboration with the oncology team. Concept of Palliative Care was introduced as specialized medical care for people and their families living with serious illness.  It focuses on providing relief from the symptoms and stress of a serious illness.  The goal is to improve quality of life for both the patient and the family. Values and goals of care important to patient and family were attempted to be elicited.  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 He lives in [place] alone. No children. Does not engage in extracurricular activities due to his level of discomfort.   Patient is ambulatory without assistive devices. Able to perform most ADLs independently however with some limitations due to pain and fatigue. Denies concerns for constipation or diarrhea.   He experiences nausea and a heightened sensitivity to smells, which can trigger a sick feeling and eventually lead to vomiting. He has been prescribed nausea medication, but it is ineffective for his symptoms. He also experiences cold-induced cramping in his feet and hands, which sometimes extends to his calves, and he suspects dehydration might be a contributing factor despite appetite being fair.   He has a history of oxaliplatin  treatment, which caused numbness and tingling in his hands and feet, symptoms that persist. He also reports restless legs, which he believes may be worsening. Discussed potential use of gabapentin  or Lyrica in the future however will continue to monitor for now.   He has experienced significant weight loss, losing almost 20 pounds after the Y90 treatment due to an inability to eat or take medications. He has a history of iron infusions, with recent labs showing a ferritin level of 617, down from 772 in December.  Mr. Lipton has experienced significant worsening of pain and sensory changes which he express frustration stating symptoms worsened post Y90 treatment. He reports pain in his liver area that radiates to his right side and arm, and describes it as sharp, stabbing, burning pain. Prior to the Y90 treatment, he was feeling well and able to engage in activities, but now experiences constant pain and reduced energy levels, making it difficult to leave the house.  The pain is exacerbated by certain movements and is not adequately managed by his current pain medications. He is taking oxycodone  10 mg every six  hours, which only reduces his pain from a 10 to a 7 or 8 at best. He also uses a heating pad, takes multiple daily hot showers, and uses CBD gummies to manage his pain, which can sometimes bring his pain down to a 3 or 4.  Complete medication, chart, and social history review completed. Patient denies any alcohol or illicit drug use. Extensive discussions and explanation of palliative's role in collaboration with his oncology team to assist in patient's pain and symptom management. Education provided on criteria for  ongoing pain support via palliative team.   I recommended Mr. Topete start use of fentanyl  25mcg patch as prescribed. Education provided on efficacy, administration, and potential side effects. He will continue on oxycodone  10mg  every 4-6 hours as needed. May consider gabapentin  or pregabalin if pain persist given some neuropathic symptoms.   He has antiemetics on hand (Zofran  and Lorazepam ) as needed.   We will plan to closely monitor and adjust medications as needed. All questions answered and support provided.   Goals of Care We discussed Mr. Matsen current illness and what it means in the larger context of his on-going co-morbidities. Natural disease trajectory and expectations were discussed.  He is realistic in his understanding of current cancer state. He expresses frustration that he is feeling worst now post treatment than he did prior to treatments. He is not currently on active therapies. Understands some etiology behind his symptom burden. Ferlin's quality of life is most important to him.   He is clear in his expressed wishes to continue to treat the treatable allowing him every opportunity to continue thriving.   I discussed the importance of continued conversation with family and their medical providers regarding overall plan of care and treatment options, ensuring decisions are within the context of the patients values and GOCs.  Assessment & Plan Established  therapeutic relationship. Education provided on palliative's role in collaboration with their Oncology/Radiation team.  Neoplasm-related pain Severe neoplasm-related pain primarily in the liver area, radiating to the right side and back, exacerbated by activity and certain movements. Pain is described as sharp, stabbing, and burning, with a severity of 10/10 at its worst. Current pain management with oxycodone  provides limited relief, reducing pain to 7-8/10. Recent imaging shows several lesions in the liver, likely contributing to the pain.  - Initiated fentanyl  patch at 25 mcg/hour, instructed to rotate application sites every three days. - Continue oxycodone  10 mg with an option to increase to every 4-6 hours as needed for pain. - Will consider pain pump or IR interventions if pain management with current regimen is insufficient. Understands the goal is maximize use of current regimen.  - Will schedule follow-up call next week to assess pain management efficacy.  Chemotherapy-induced peripheral neuropathy Persistent peripheral neuropathy with numbness and tingling in hands and feet, exacerbated by cold exposure. Symptoms are likely related to previous chemotherapy treatments, including oxaliplatin , and may persist long after treatment cessation. - Continue current management with heat application and CBD gummies for symptom relief.  Chemotherapy-induced nausea and vomiting Persistent nausea and vomiting, exacerbated by certain smells and cold stimuli. Symptoms have worsened since Y90 treatment. Current management with ondansetron  provides limited relief. - Continue ondansetron  for nausea management.  Restless legs syndrome secondary to cancer therapy Worsening restless legs syndrome, likely secondary to cancer therapy. Symptoms have been present for several months and may be related to chemotherapy-induced changes. - Recommended CBD creams or oils for symptomatic relief. - Continue current use  of CBD, THC, and CBG gummies.  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 Anxiety may be exacerbated by current pain and treatment side effects. - Continue lorazepam  as needed for anxiety management.  Goals of care Patient is realistic in his understanding of cancer state. His goals are clear to treat the treatable allowing him every opportunity to thrive. His quality of life is most important.   Patient expressed understanding and was in agreement with this plan. He  also understands that He can call the clinic at any time with any questions, concerns, or complaints.   Thank you for your referral and allowing Palliative to assist in Mr. Hasheem Voland Shea's care.   Number and complexity of problems addressed: HIGH - 1 or more chronic illnesses with SEVERE exacerbation, progression, or side effects of treatment - advanced cancer, pain. Any controlled substances utilized were prescribed in the context of palliative care.  I personally spent a total of 75 minutes in the care of the patient today including preparing to see the patient, getting/reviewing separately obtained history, performing a medically appropriate exam/evaluation, counseling and educating, placing orders, referring and communicating with other health care professionals, documenting clinical information in the EHR, independently interpreting results, and communicating results. Visit consisted of counseling and education dealing with the complex and emotionally intense issues of symptom management and palliative care in the setting of serious and potentially life-threatening illness.  Signed by: Levon Borer, AGPCNP-BC Palliative Medicine Team/ Cancer Center    "

## 2024-09-14 ENCOUNTER — Other Ambulatory Visit (HOSPITAL_BASED_OUTPATIENT_CLINIC_OR_DEPARTMENT_OTHER): Payer: Self-pay

## 2024-09-18 ENCOUNTER — Other Ambulatory Visit: Payer: Self-pay | Admitting: Nurse Practitioner

## 2024-09-19 ENCOUNTER — Other Ambulatory Visit: Payer: Self-pay | Admitting: Nurse Practitioner

## 2024-09-19 ENCOUNTER — Other Ambulatory Visit (HOSPITAL_BASED_OUTPATIENT_CLINIC_OR_DEPARTMENT_OTHER): Payer: Self-pay

## 2024-09-19 ENCOUNTER — Other Ambulatory Visit: Payer: Self-pay

## 2024-09-19 MED ORDER — OXYCODONE HCL 10 MG PO TABS
10.0000 mg | ORAL_TABLET | Freq: Four times a day (QID) | ORAL | 0 refills | Status: AC | PRN
  Filled 2024-09-25: qty 90, 23d supply, fill #0

## 2024-09-19 MED ORDER — OXYCODONE HCL ER 10 MG PO T12A
10.0000 mg | EXTENDED_RELEASE_TABLET | Freq: Two times a day (BID) | ORAL | 0 refills | Status: DC
  Filled 2024-09-19: qty 30, 15d supply, fill #0

## 2024-09-19 NOTE — Telephone Encounter (Signed)
 This RN called to check in on pt as he reported an allergic reaction to fentanyl  patches over the weekend. Pt reported that he had increased HR, SOB, and dizziness with the fentanyl  patches, which he discontinued on Sunday. Since removing the patch pt reports that symptoms have improved but he still feels that he is slightly SOB and his heart rate is also still slightly elevated. RN provided updates to Pedro Bay, NP, who sent in oxycontin  to replace the fentanyl  patches, as pt was still reporting pain a 7/10. RN provided education on what the medication is, how it works, and what symptoms to look out for to the pt, pt verbalized understanding. Pt agreed to try the OxyContin  and to bring in unused fentanyl  patches to the [facility]. No further needs at this time, pt knows to call with any questions or concerns.

## 2024-09-21 ENCOUNTER — Inpatient Hospital Stay

## 2024-09-21 ENCOUNTER — Encounter: Payer: Self-pay | Admitting: Nurse Practitioner

## 2024-09-21 ENCOUNTER — Telehealth: Payer: Self-pay | Admitting: Nurse Practitioner

## 2024-09-21 NOTE — Telephone Encounter (Signed)
 See RN note.

## 2024-09-25 ENCOUNTER — Other Ambulatory Visit: Payer: Self-pay

## 2024-09-26 ENCOUNTER — Inpatient Hospital Stay: Admitting: Nurse Practitioner

## 2024-09-29 ENCOUNTER — Encounter: Payer: Self-pay | Admitting: Hematology & Oncology

## 2024-09-29 ENCOUNTER — Inpatient Hospital Stay

## 2024-09-29 ENCOUNTER — Inpatient Hospital Stay: Admitting: Hematology & Oncology

## 2024-09-29 ENCOUNTER — Other Ambulatory Visit (HOSPITAL_BASED_OUTPATIENT_CLINIC_OR_DEPARTMENT_OTHER): Payer: Self-pay

## 2024-09-29 VITALS — BP 125/78 | HR 62 | Temp 98.4°F | Resp 20 | Ht 70.0 in | Wt 183.0 lb

## 2024-09-29 DIAGNOSIS — C787 Secondary malignant neoplasm of liver and intrahepatic bile duct: Secondary | ICD-10-CM

## 2024-09-29 DIAGNOSIS — C189 Malignant neoplasm of colon, unspecified: Secondary | ICD-10-CM

## 2024-09-29 DIAGNOSIS — R112 Nausea with vomiting, unspecified: Secondary | ICD-10-CM

## 2024-09-29 DIAGNOSIS — D649 Anemia, unspecified: Secondary | ICD-10-CM

## 2024-09-29 LAB — CBC WITH DIFFERENTIAL (CANCER CENTER ONLY)
Abs Immature Granulocytes: 0.02 10*3/uL (ref 0.00–0.07)
Basophils Absolute: 0 10*3/uL (ref 0.0–0.1)
Basophils Relative: 0 %
Eosinophils Absolute: 0.2 10*3/uL (ref 0.0–0.5)
Eosinophils Relative: 4 %
HCT: 41.4 % (ref 39.0–52.0)
Hemoglobin: 14 g/dL (ref 13.0–17.0)
Immature Granulocytes: 0 %
Lymphocytes Relative: 21 %
Lymphs Abs: 1.1 10*3/uL (ref 0.7–4.0)
MCH: 31.1 pg (ref 26.0–34.0)
MCHC: 33.8 g/dL (ref 30.0–36.0)
MCV: 92 fL (ref 80.0–100.0)
Monocytes Absolute: 0.4 10*3/uL (ref 0.1–1.0)
Monocytes Relative: 8 %
Neutro Abs: 3.4 10*3/uL (ref 1.7–7.7)
Neutrophils Relative %: 67 %
Platelet Count: 116 10*3/uL — ABNORMAL LOW (ref 150–400)
RBC: 4.5 MIL/uL (ref 4.22–5.81)
RDW: 13 % (ref 11.5–15.5)
WBC Count: 5.1 10*3/uL (ref 4.0–10.5)
nRBC: 0 % (ref 0.0–0.2)

## 2024-09-29 LAB — CMP (CANCER CENTER ONLY)
ALT: 96 U/L — ABNORMAL HIGH (ref 0–44)
AST: 74 U/L — ABNORMAL HIGH (ref 15–41)
Albumin: 4 g/dL (ref 3.5–5.0)
Alkaline Phosphatase: 399 U/L — ABNORMAL HIGH (ref 38–126)
Anion gap: 12 (ref 5–15)
BUN: 12 mg/dL (ref 6–20)
CO2: 25 mmol/L (ref 22–32)
Calcium: 10 mg/dL (ref 8.9–10.3)
Chloride: 105 mmol/L (ref 98–111)
Creatinine: 0.92 mg/dL (ref 0.61–1.24)
GFR, Estimated: >60 mL/min
Glucose, Bld: 111 mg/dL — ABNORMAL HIGH (ref 70–99)
Potassium: 4.2 mmol/L (ref 3.5–5.1)
Sodium: 141 mmol/L (ref 135–145)
Total Bilirubin: 0.6 mg/dL (ref 0.0–1.2)
Total Protein: 7.1 g/dL (ref 6.5–8.1)

## 2024-09-29 LAB — IRON AND IRON BINDING CAPACITY (CC-WL,HP ONLY)
Iron: 72 ug/dL (ref 45–182)
Saturation Ratios: 24 % (ref 17.9–39.5)
TIBC: 304 ug/dL (ref 250–450)
UIBC: 232 ug/dL

## 2024-09-29 LAB — CEA (ACCESS): CEA (CHCC): 138.74 ng/mL — ABNORMAL HIGH (ref 0.00–5.00)

## 2024-09-29 LAB — FERRITIN: Ferritin: 499 ng/mL — ABNORMAL HIGH (ref 24–336)

## 2024-09-29 LAB — PREALBUMIN: Prealbumin: 18 mg/dL (ref 18–38)

## 2024-09-29 LAB — LACTATE DEHYDROGENASE: LDH: 144 U/L (ref 105–235)

## 2024-09-29 MED ORDER — OXYCODONE HCL ER 20 MG PO T12A
20.0000 mg | EXTENDED_RELEASE_TABLET | Freq: Two times a day (BID) | ORAL | 0 refills | Status: AC
  Filled 2024-09-29: qty 60, 30d supply, fill #0

## 2024-09-29 NOTE — Progress Notes (Unsigned)
 " Hematology and Oncology Follow Up Visit  Richard Bowman [id number] 1976/05/30 49 y.o. 09/29/2024   Principle Diagnosis:  Metastatic adenocarcinoma of the colon-liver metastasis -- pMMR/MSI-low -- (+) KRAS/ ERBB2(+) Iron deficiency anemia Superficial thrombus of the right forearm   Current Therapy:        FOLFOXIRI - s/p cycle #11  - start on  -- 12/09/2022 --oxaliplatin  dropped after 04/19/2023 IV iron as indicated Xeloda /Avastin  -- s/p cycle #1 on 09/13/2023 - Avastin  on hold - Xeloda  d/c'ed on 10/15/2023 Enhertu  - s/p cycle #2 - start on 11/26/2023 -DC on 04/25/2024 Intrahepatic Y-90 -first dose on 12/23/2023 Xarelto  10 mg p.o. daily-started on 12/19/2023 Avastin /Lonsurf  80 mg po BID (d1-5;d 8-12) - started 05/02/2024, s/p cycle #3 - d/c on 08/03/2024   Interim History:  Richard Bowman is here today for follow-up. Unfortunately, he went for biopsy but CT did not show any appropriate biopsy sites.  CT scan showed stable metastatic disease.  Avastin  and Lonsurf  will remain on hold for now per Richard Bowman.  He c/o persistent pain across the top of the abdomen as well as the pelvis and hips.  He has also had nausea in the am with occasional vomiting prior to eating breakfast.  He has not started the Zyprexa  and needs a refill on his zofran .  He is currently on short acting pain management which he feels is not controlling his discomfort. No fever, chills, cough, rash, dizziness, SOB, chest pain, palpitations or changes in bowel or bladder habits at this time.  He has constipation at times with occasional bright red blood in his stool from straining.  No swelling, numbness or tingling in his hands or feet.  Appetite comes and goes and he admits that he needs to better hydrate throughout the day. Weight is stable at 180 lbs.   ECOG Performance Status: 1 - Symptomatic but completely ambulatory  Medications:  Allergies as of 09/29/2024       Reactions   Fentanyl  Shortness Of Breath,  Palpitations, Other (See Comments)   Pt reports increased heart rate, shortness of breath, and dizziness with use of fentanyl  patches.    Xeloda  [capecitabine ] Nausea And Vomiting, Other (See Comments)   Chest pain        Medication List        Accurate as of September 29, 2024 11:33 AM. If you have any questions, ask your nurse or doctor.          STOP taking these medications    lidocaine -prilocaine  cream Commonly known as: EMLA  Stopped by: Maude Timmy, MD       TAKE these medications    acetaminophen  500 MG tablet Commonly known as: TYLENOL  Take 2 tablets (1,000 mg total) by mouth every 6 (six) hours as needed for mild pain.   dronabinol  5 MG capsule Commonly known as: MARINOL  Take 1 capsule (5 mg total) by mouth 2 (two) times daily before a meal.   lactose free nutrition Liqd Take 237 mLs by mouth daily as needed (Supplement).   Lonsurf  20-8.19 MG tablet Generic drug: trifluridine -tipiracil  Take 4 tablets (80 mg of trifluridine  total) by mouth 2 (two) times daily after a meal. Take within 1 hr after AM & PM meals on days 1-5, 8-12. Repeat every 28 days.   LORazepam  0.5 MG tablet Commonly known as: ATIVAN  Place 1 tablet (0.5 mg total) under the tongue every 6 (six) hours as needed for anxiety.   nystatin  100000 UNIT/ML suspension Commonly known as: MYCOSTATIN  Take 5  mLs (500,000 Units total) by mouth 4 (four) times daily.   OLANZapine  10 MG tablet Commonly known as: ZYPREXA  Take 1 tablet (10 mg total) by mouth at bedtime.   ondansetron  4 MG disintegrating tablet Commonly known as: ZOFRAN -ODT Take 1 tablet (4 mg total) by mouth every 8 (eight) hours as needed for nausea or vomiting.   OxyCONTIN  10 MG 12 hr tablet Generic drug: oxyCODONE  Take 1 tablet (10 mg total) by mouth every 12 (twelve) hours.   Oxycodone  HCl 10 MG Tabs Take 1 tablet (10 mg total) by mouth every 6 (six) hours as needed.   pantoprazole  40 MG tablet Commonly known as:  Protonix  Take 1 tablet (40 mg total) by mouth 2 (two) times daily.        Allergies: Allergies[1]  Past Medical History, Surgical history, Social history, and Family History were reviewed and updated.  Review of Systems: All other 10 point review of systems is negative.   Physical Exam:  height is 5' 10 (1.778 m) and weight is 183 lb (83 kg). His oral temperature is 98.4 F (36.9 C). His blood pressure is 125/78 and his pulse is 62. His respiration is 20 and oxygen saturation is 99%.   Wt Readings from Last 3 Encounters:  09/29/24 183 lb (83 kg)  09/13/24 182 lb 1.6 oz (82.6 kg)  09/08/24 180 lb (81.6 kg)    Ocular: Sclerae unicteric, pupils equal, round and reactive to light Ear-nose-throat: Oropharynx clear, dentition fair Lymphatic: No cervical or supraclavicular adenopathy Lungs no rales or rhonchi, good excursion bilaterally Heart regular rate and rhythm, no murmur appreciated Abd soft, nontender, positive bowel sounds MSK no focal spinal tenderness, no joint edema Neuro: non-focal, well-oriented, appropriate affect Breasts: Deferred   Lab Results  Component Value Date   WBC 5.1 09/29/2024   HGB 14.0 09/29/2024   HCT 41.4 09/29/2024   MCV 92.0 09/29/2024   PLT 116 (L) 09/29/2024   Lab Results  Component Value Date   FERRITIN 617 (H) 09/08/2024   IRON 115 09/08/2024   TIBC 335 09/08/2024   UIBC 220 09/08/2024   IRONPCTSAT 34 09/08/2024   Lab Results  Component Value Date   RETICCTPCT 0.7 04/25/2024   RBC 4.50 09/29/2024   No results found for: KPAFRELGTCHN, LAMBDASER, KAPLAMBRATIO No results found for: IGGSERUM, IGA, IGMSERUM No results found for: STEPHANY CARLOTA BENSON MARKEL EARLA JOANNIE DOC VICK, SPEI   Chemistry      Component Value Date/Time   NA 141 09/29/2024 0959   K 4.2 09/29/2024 0959   CL 105 09/29/2024 0959   CO2 25 09/29/2024 0959   BUN 12 09/29/2024 0959   CREATININE 0.92 09/29/2024 0959       Component Value Date/Time   CALCIUM  10.0 09/29/2024 0959   ALKPHOS 399 (H) 09/29/2024 0959   AST 74 (H) 09/29/2024 0959   ALT 96 (H) 09/29/2024 0959   BILITOT 0.6 09/29/2024 0959       Impression and Plan: Richard Bowman is a very nice 49 yo caucasian gentleman with metastatic colon cancer. He had progression on Enhertu .   CEA at last visit was 52 (previously 29). Today's results is pending.  Lonsurf /Avastin  will remain on hold per Richard Bowman.  He will start Zyprexa  for nasuea. Zofran  refilled.  We will add Duragesic  patch 25 mcg for better pain control. He will let us  know if this helps.  No new intervention at this time.  Follow-up with MD top reassess in 3 weeks.  Maude JONELLE Crease, MD 1/30/202611:33 AM     [1]  Allergies Allergen Reactions   Fentanyl  Shortness Of Breath, Palpitations and Other (See Comments)    Pt reports increased heart rate, shortness of breath, and dizziness with use of fentanyl  patches.    Xeloda  [Capecitabine ] Nausea And Vomiting and Other (See Comments)    Chest pain   "

## 2024-10-02 ENCOUNTER — Other Ambulatory Visit: Payer: Self-pay

## 2024-10-02 ENCOUNTER — Other Ambulatory Visit (HOSPITAL_BASED_OUTPATIENT_CLINIC_OR_DEPARTMENT_OTHER): Payer: Self-pay

## 2024-10-02 ENCOUNTER — Other Ambulatory Visit (HOSPITAL_COMMUNITY): Payer: Self-pay

## 2024-10-03 ENCOUNTER — Telehealth: Payer: Self-pay

## 2024-10-03 ENCOUNTER — Other Ambulatory Visit (HOSPITAL_BASED_OUTPATIENT_CLINIC_OR_DEPARTMENT_OTHER): Payer: Self-pay

## 2024-10-03 ENCOUNTER — Other Ambulatory Visit: Payer: Self-pay | Admitting: Hematology & Oncology

## 2024-10-03 ENCOUNTER — Encounter: Payer: Self-pay | Admitting: Hematology & Oncology

## 2024-10-03 MED ORDER — DRONABINOL 5 MG PO CAPS
5.0000 mg | ORAL_CAPSULE | Freq: Two times a day (BID) | ORAL | 0 refills | Status: AC
  Filled 2024-10-03: qty 60, 30d supply, fill #0

## 2024-10-04 ENCOUNTER — Inpatient Hospital Stay: Attending: Hematology & Oncology | Admitting: Nurse Practitioner

## 2024-10-10 ENCOUNTER — Ambulatory Visit (HOSPITAL_BASED_OUTPATIENT_CLINIC_OR_DEPARTMENT_OTHER)

## 2024-10-13 ENCOUNTER — Inpatient Hospital Stay

## 2024-10-13 ENCOUNTER — Inpatient Hospital Stay: Admitting: Hematology & Oncology
